# Patient Record
Sex: Male | Born: 1945
Health system: Southern US, Community
[De-identification: ages and names within clinical notes are randomized; demographics above are authoritative.]

## PROBLEM LIST (undated history)

## (undated) DIAGNOSIS — I82409 Acute embolism and thrombosis of unspecified deep veins of unspecified lower extremity: Secondary | ICD-10-CM

## (undated) DIAGNOSIS — R519 Headache, unspecified: Secondary | ICD-10-CM

## (undated) DIAGNOSIS — R079 Chest pain, unspecified: Secondary | ICD-10-CM

## (undated) DIAGNOSIS — R51 Headache: Secondary | ICD-10-CM

## (undated) DIAGNOSIS — E119 Type 2 diabetes mellitus without complications: Secondary | ICD-10-CM

## (undated) DIAGNOSIS — IMO0001 Reserved for inherently not codable concepts without codable children: Secondary | ICD-10-CM

## (undated) DIAGNOSIS — I2510 Atherosclerotic heart disease of native coronary artery without angina pectoris: Secondary | ICD-10-CM

## (undated) DIAGNOSIS — E7800 Pure hypercholesterolemia, unspecified: Secondary | ICD-10-CM

## (undated) DIAGNOSIS — G5603 Carpal tunnel syndrome, bilateral upper limbs: Secondary | ICD-10-CM

## (undated) DIAGNOSIS — K219 Gastro-esophageal reflux disease without esophagitis: Secondary | ICD-10-CM

## (undated) DIAGNOSIS — E785 Hyperlipidemia, unspecified: Secondary | ICD-10-CM

## (undated) DIAGNOSIS — I2699 Other pulmonary embolism without acute cor pulmonale: Secondary | ICD-10-CM

## (undated) DIAGNOSIS — M1990 Unspecified osteoarthritis, unspecified site: Secondary | ICD-10-CM

## (undated) DIAGNOSIS — I10 Essential (primary) hypertension: Secondary | ICD-10-CM

## (undated) DIAGNOSIS — M62838 Other muscle spasm: Secondary | ICD-10-CM

## (undated) HISTORY — DX: Chest pain, unspecified: R07.9

## (undated) HISTORY — PX: ELBOW SURGERY: SHX618

## (undated) HISTORY — PX: TOE SURGERY: SHX1073

## (undated) HISTORY — DX: Acute embolism and thrombosis of unspecified deep veins of unspecified lower extremity: I82.409

## (undated) HISTORY — DX: Other muscle spasm: M62.838

## (undated) HISTORY — DX: Pure hypercholesterolemia, unspecified: E78.00

## (undated) HISTORY — PX: EYE SURGERY: SHX253

## (undated) HISTORY — DX: Type 2 diabetes mellitus without complications: E11.9

## (undated) HISTORY — PX: LEG SURGERY: SHX1003

## (undated) HISTORY — DX: Hyperlipidemia, unspecified: E78.5

## (undated) HISTORY — DX: Carpal tunnel syndrome, bilateral upper limbs: G56.03

## (undated) HISTORY — PX: BILATERAL CARPAL TUNNEL RELEASE: SHX6508

## (undated) HISTORY — PX: COLONOSCOPY: SHX174

## (undated) HISTORY — PX: FOOT SURGERY: SHX648

## (undated) HISTORY — DX: Atherosclerotic heart disease of native coronary artery without angina pectoris: I25.10

## (undated) HISTORY — DX: Gastro-esophageal reflux disease without esophagitis: K21.9

## (undated) HISTORY — PX: SHOULDER SURGERY: SHX246

## (undated) HISTORY — DX: Unspecified osteoarthritis, unspecified site: M19.90

## (undated) HISTORY — DX: Essential (primary) hypertension: I10

---

## 1985-09-04 HISTORY — PX: SPINE SURGERY: SHX786

## 1999-02-23 ENCOUNTER — Encounter: Admission: RE | Admit: 1999-02-23 | Discharge: 1999-04-04 | Payer: Self-pay | Admitting: Anesthesiology

## 1999-09-28 ENCOUNTER — Encounter: Admission: RE | Admit: 1999-09-28 | Discharge: 1999-12-27 | Payer: Self-pay | Admitting: Endocrinology

## 2003-08-18 ENCOUNTER — Emergency Department (HOSPITAL_COMMUNITY): Admission: EM | Admit: 2003-08-18 | Discharge: 2003-08-18 | Payer: Self-pay | Admitting: Emergency Medicine

## 2004-01-26 ENCOUNTER — Ambulatory Visit (HOSPITAL_COMMUNITY): Admission: RE | Admit: 2004-01-26 | Discharge: 2004-01-26 | Payer: Self-pay | Admitting: Cardiology

## 2006-08-05 HISTORY — PX: CATARACT EXTRACTION W/PHACO: SHX586

## 2006-08-18 HISTORY — PX: CATARACT EXTRACTION W/PHACO: SHX586

## 2007-01-08 ENCOUNTER — Encounter (INDEPENDENT_AMBULATORY_CARE_PROVIDER_SITE_OTHER): Payer: Self-pay | Admitting: *Deleted

## 2007-01-08 ENCOUNTER — Ambulatory Visit (HOSPITAL_COMMUNITY): Admission: RE | Admit: 2007-01-08 | Discharge: 2007-01-08 | Payer: Self-pay | Admitting: *Deleted

## 2007-01-08 ENCOUNTER — Encounter (INDEPENDENT_AMBULATORY_CARE_PROVIDER_SITE_OTHER): Payer: Self-pay | Admitting: Specialist

## 2010-03-31 ENCOUNTER — Encounter (INDEPENDENT_AMBULATORY_CARE_PROVIDER_SITE_OTHER): Payer: Self-pay | Admitting: *Deleted

## 2010-05-12 ENCOUNTER — Ambulatory Visit: Payer: Self-pay | Admitting: Gastroenterology

## 2010-05-12 DIAGNOSIS — I10 Essential (primary) hypertension: Secondary | ICD-10-CM | POA: Insufficient documentation

## 2010-05-12 DIAGNOSIS — K219 Gastro-esophageal reflux disease without esophagitis: Secondary | ICD-10-CM | POA: Insufficient documentation

## 2010-05-12 DIAGNOSIS — Z860100 Personal history of colon polyps, unspecified: Secondary | ICD-10-CM | POA: Insufficient documentation

## 2010-05-12 DIAGNOSIS — E119 Type 2 diabetes mellitus without complications: Secondary | ICD-10-CM | POA: Insufficient documentation

## 2010-05-12 LAB — CONVERTED CEMR LAB
ALT: 22 units/L (ref 0–53)
AST: 25 units/L (ref 0–37)
Albumin: 4 g/dL (ref 3.5–5.2)
Alkaline Phosphatase: 82 units/L (ref 39–117)
BUN: 20 mg/dL (ref 6–23)
Basophils Absolute: 0.1 10*3/uL (ref 0.0–0.1)
Basophils Relative: 0.9 % (ref 0.0–3.0)
Bilirubin, Direct: 0.1 mg/dL (ref 0.0–0.3)
CO2: 25 meq/L (ref 19–32)
Calcium: 9.4 mg/dL (ref 8.4–10.5)
Chloride: 109 meq/L (ref 96–112)
Creatinine, Ser: 1.3 mg/dL (ref 0.4–1.5)
Eosinophils Absolute: 0.1 10*3/uL (ref 0.0–0.7)
Eosinophils Relative: 1.9 % (ref 0.0–5.0)
Ferritin: 27.6 ng/mL (ref 22.0–322.0)
Folate: 7.4 ng/mL
GFR calc non Af Amer: 73.86 mL/min (ref >60)
Glucose, Bld: 193 mg/dL — ABNORMAL HIGH (ref 70–99)
HCT: 34.5 % — ABNORMAL LOW (ref 39.0–52.0)
Hemoglobin: 11.8 g/dL — ABNORMAL LOW (ref 13.0–17.0)
Iron: 66 ug/dL (ref 42–165)
Lymphocytes Relative: 30.5 % (ref 12.0–46.0)
Lymphs Abs: 2.2 10*3/uL (ref 0.7–4.0)
MCHC: 34 g/dL (ref 30.0–36.0)
MCV: 84 fL (ref 78.0–100.0)
Monocytes Absolute: 0.7 10*3/uL (ref 0.1–1.0)
Monocytes Relative: 9.5 % (ref 3.0–12.0)
Neutro Abs: 4 10*3/uL (ref 1.4–7.7)
Neutrophils Relative %: 57.2 % (ref 43.0–77.0)
Platelets: 210 10*3/uL (ref 150.0–400.0)
Potassium: 4.5 meq/L (ref 3.5–5.1)
RBC: 4.11 M/uL — ABNORMAL LOW (ref 4.22–5.81)
RDW: 15.8 % — ABNORMAL HIGH (ref 11.5–14.6)
Saturation Ratios: 19.6 % — ABNORMAL LOW (ref 20.0–50.0)
Sodium: 142 meq/L (ref 135–145)
TSH: 1.36 microintl units/mL (ref 0.35–5.50)
Total Bilirubin: 0.5 mg/dL (ref 0.3–1.2)
Total Protein: 7.1 g/dL (ref 6.0–8.3)
Transferrin: 241 mg/dL (ref 212.0–360.0)
Vitamin B-12: 347 pg/mL (ref 211–911)
WBC: 7.1 10*3/uL (ref 4.5–10.5)

## 2010-06-21 ENCOUNTER — Ambulatory Visit: Payer: Self-pay | Admitting: Gastroenterology

## 2010-09-25 ENCOUNTER — Encounter: Payer: Self-pay | Admitting: Orthopaedic Surgery

## 2010-09-26 ENCOUNTER — Encounter: Payer: Self-pay | Admitting: Orthopaedic Surgery

## 2010-10-04 NOTE — Procedures (Signed)
Summary: Colonoscopy & Pathology   Colonoscopy  Procedure date:  01/08/2007  Findings:      Location:  [facility].   NAME:  Matthew Zimmerman, Matthew Zimmerman           ACCOUNT NO.:  [id number]   MEDICAL RECORD NO.:  [id number]          PATIENT TYPE:  AMB   LOCATION:  ENDO                         FACILITY:  [facility]   PHYSICIAN:  Georgiana Spinner, M.D.    DATE OF BIRTH:  12-Oct-1945   DATE OF PROCEDURE:  DATE OF DISCHARGE:                               OPERATIVE REPORT   PROCEDURE:  Colonoscopy.   INDICATIONS:  Colon polyp.   ANESTHESIA:  Demerol 50, Versed 5 mg.   PROCEDURE:  With the patient mildly sedated in the left lateral  decubitus position, the Pentax videoscopic colonoscope was inserted into  the rectum and passed under direct vision to the cecum, identified by  ileocecal valve and appendiceal orifice, both of which were  photographed. From this point, the colonoscope was slowly withdrawn,  taking circumferential views of the colonic mucosa, stopping at  approximately 20 cm from the anal verge at which point a small polyp was  seen and removed using hot biopsy forceps technique at a  setting of  20/200 blended current.  The endoscope was then withdrawn to the rectum  which appeared normal on direct and showed hemorrhoids on retroflexed  view.  The endoscope was straightened and withdrawn.  The patient's  vital signs and pulse oximeter remained stable.  The patient tolerated  the procedure well without apparent complications.   FINDINGS:  Polyp at 20 cm, internal hemorrhoids; otherwise, an  unremarkable exam.   PLAN:  Await biopsy report.  The patient will call me for results and  follow up with me as an outpatient.           ______________________________  Georgiana Spinner, M.D.     GMO/MEDQ  D:  01/08/2007  T:  01/08/2007  Job:  [id number]    SP Surgical Pathology - STATUS: Final             By: Morrie Sheldon,       Perform Date: 6 May08 00:01  Ordered By: Jarvis Newcomer,            Ordered Date: 6 May08 10:28  Facility: [facility]                              Department: CPATH  Service Report Text  [facility]   [address]   [place], [place] [address]   [phone]    REPORT OF SURGICAL PATHOLOGY    Case #: [id number]   Patient Name: Matthew Zimmerman, Matthew Zimmerman   Office Chart Number: N/A    MRN: [id number]   Pathologist: Beulah Gandy. Luisa Hart, MD   DOB/Age December 01, 1945 (Age: 65) Gender: M   Date Taken: 01/08/2007   Date Received: 01/08/2007    FINAL DIAGNOSIS    ***MICROSCOPIC EXAMINATION AND DIAGNOSIS***    1. ESOPHAGUS, BIOPSIES: GASTROESOPHAGEAL JUNCTION MUCOSA WITH   MILD INFLAMMATION CONSISTENT  WITH GASTROESOPHAGEAL REFLUX. NO   INTESTINAL METAPLASIA, DYSPLASIA OR MALIGNANCY IDENTIFIED.    2. COLON, POLYP AT20 CM: TUBULAR ADENOMA. NO HIGH GRADE   DYSPLASIA OR MALIGNANCY IDENTIFIED.    COMMENT   1. An Alcian Blue stain is performed to determine the presence   of intestinal metaplasia (goblet cell metaplasia). No intestinal   metaplasia (goblet cell metaplasia) is identified with the Alcian   Blue stain. The control stained appropriately. (JDP:mj 01/09/07)    mj   Date Reported: 01/09/2007 Beulah Gandy. Luisa Hart, MD   *** Electronically Signed Out By JDP ***    Clinical information   R/O Barrett' s (ni)    specimen(s) obtained   1: Esophagus, biopsy, distal   2: Colon, polyp(s), 20cm    Gross Description   1. Received in formalin are tan, soft tissue fragments that are   submitted in toto. Number: two.   Size: 0.2 and 0.3 cm    2. Received in formalin are tan, soft tissue fragments that are   submitted in toto. Number: two.   Size: 0.2 and 0.3 cm (JBM:mw 01/08/07)    mw/

## 2010-10-04 NOTE — Letter (Signed)
Summary: New Patient letter  [facility]  [address] [place], [place] [address]   Phone: [phone]  Fax: [phone]       03/31/2010 MRN: [id number]  Matthew Zimmerman [address] [place], [place]  [address]  Dear [name],  Welcome to the Gastroenterology Division at [facility].    You are scheduled to see Dr.  Jarold Motto on 05-12-10 at 9:30a.m. on the 3rd floor at [facility], [address]  We ask that you try to arrive at our office 15 minutes prior to your appointment time to allow for check-in.  We would like you to complete the enclosed self-administered evaluation form prior to your visit and bring it with you on the day of your appointment.  We will review it with you.  Also, please bring a complete list of all your medications or, if you prefer, bring the medication bottles and we will list them.  Please bring your insurance card so that we may make a copy of it.  If your insurance requires a referral to see a specialist, please bring your referral form from your primary care physician.  Co-payments are due at the time of your visit and may be paid by cash, check or credit card.     Your office visit will consist of a consult with your physician (includes a physical exam), any laboratory testing he/she may order, scheduling of any necessary diagnostic testing (e.g. x-ray, ultrasound, CT-scan), and scheduling of a procedure (e.g. Endoscopy, Colonoscopy) if required.  Please allow enough time on your schedule to allow for any/all of these possibilities.    If you cannot keep your appointment, please call [phone] to cancel or reschedule prior to your appointment date.  This allows [name] the opportunity to schedule an appointment for another patient in need of care.  If you do not cancel or reschedule by 5 p.m. the business day prior to your appointment date, you will be charged a $50.00 late cancellation/no-show fee.    Thank you for  choosing [facility] for your medical needs.  We appreciate the opportunity to care for you.  Please visit [name] at our website  to learn more about our practice.                     Sincerely,                                                             The Gastroenterology Division

## 2010-10-04 NOTE — Procedures (Signed)
Summary: Endoscopy & Pathology   EGD  Procedure date:  01/08/2007  Findings:      Location: [facility]   NAME:  Matthew Zimmerman, Matthew Zimmerman           ACCOUNT NO.:  [id number]   MEDICAL RECORD NO.:  [id number]          PATIENT TYPE:  AMB   LOCATION:  ENDO                         FACILITY:  [facility]   PHYSICIAN:  Georgiana Spinner, M.D.    DATE OF BIRTH:  1946-02-04   DATE OF PROCEDURE:  01/08/2007  DATE OF DISCHARGE:                               OPERATIVE REPORT   PROCEDURE:  Upper endoscopy.   INDICATIONS:  Gastroesophageal reflux disease.   ANESTHESIA:  Demerol 50, Versed 5 mg.   PROCEDURE:  With the patient mildly sedated in left lateral decubitus  position the Pentax videoscopic endoscope was inserted and passed under  direct vision through the esophagus which appeared normal until we  reached distal esophagus and question of Barrett's photographed and  biopsied.  We entered into the stomach.  Fundus, body, antrum, duodenal  bulb, second portion duodenum appeared normal.  From this point the  endoscope was slowly withdrawn taking circumferential views of duodenal  mucosa until the endoscope had been pulled back into the stomach placed  in retroflexion to view the stomach from below.  Endoscope was  straightened and withdrawn.  The patient's vital signs, pulse oximeter  remained stable.  The patient tolerated procedure well without apparent  complications.   FINDINGS:  Loose wrap of the GE junction around the endoscope, question  of Barrett's esophagus.  Await biopsy report.  The patient will call me  for results and follow-up with me as an outpatient.  Proceed to  colonoscopy.           ______________________________  Georgiana Spinner, M.D.     GMO/MEDQ  D:  01/08/2007  T:  01/08/2007  Job:  [id number]    SP Surgical Pathology - STATUS: Final             By: Morrie Sheldon,       Perform Date: 6 May08 00:01  Ordered By: Jarvis Newcomer,            Ordered Date: 6  May08 10:28  Facility: [facility]                              Department: CPATH  Service Report Text  [facility]   [address]   [place], [place] [address]   [phone]    REPORT OF SURGICAL PATHOLOGY    Case #: [id number]   Patient Name: [name], [name]   Office Chart Number: N/A    MRN: [id number]   Pathologist: Beulah Gandy. Luisa Hart, MD   DOB/Age [birth year] (Age: 28) Gender: M   Date Taken: 01/08/2007   Date Received: 01/08/2007    FINAL DIAGNOSIS    ***MICROSCOPIC EXAMINATION AND DIAGNOSIS***    1. ESOPHAGUS, BIOPSIES: GASTROESOPHAGEAL JUNCTION MUCOSA WITH   MILD INFLAMMATION CONSISTENT WITH GASTROESOPHAGEAL REFLUX. NO   INTESTINAL METAPLASIA, DYSPLASIA OR MALIGNANCY IDENTIFIED.  2. COLON, POLYP AT20 CM: TUBULAR ADENOMA. NO HIGH GRADE   DYSPLASIA OR MALIGNANCY IDENTIFIED.    COMMENT   1. An Alcian Blue stain is performed to determine the presence   of intestinal metaplasia (goblet cell metaplasia). No intestinal   metaplasia (goblet cell metaplasia) is identified with the Alcian   Blue stain. The control stained appropriately. (JDP:mj 01/09/07)    mj   Date Reported: 01/09/2007 Beulah Gandy. Luisa Hart, MD   *** Electronically Signed Out By JDP ***    Clinical information   R/O Barrett' s (ni)    specimen(s) obtained   1: Esophagus, biopsy, distal   2: Colon, polyp(s), 20cm    Gross Description   1. Received in formalin are tan, soft tissue fragments that are   submitted in toto. Number: two.   Size: 0.2 and 0.3 cm    2. Received in formalin are tan, soft tissue fragments that are   submitted in toto. Number: two.   Size: 0.2 and 0.3 cm (JBM:mw 01/08/07)    mw/

## 2010-10-04 NOTE — Assessment & Plan Note (Signed)
Summary: establish new G.I.--ch.   History of Present Illness Visit Type: new patient  Primary GI MD: Sheryn Bison MD FACP FAGA Primary Provider: Claudie Fisherman, MD  Requesting Provider: na Chief Complaint: Establish new GI, was seeing Dr. Virginia Zimmerman. Pt c/o GERD, and cramps in legs  History of Present Illness:   65 year old African American male referred for evaluation of her the typical as the reflux symptoms mostly at night and also recurrent cramps in his lower extremities.  Matthew Zimmerman had a negative endoscopy by Dr. Sabino Zimmerman several years ago. At that time he also had removal of an adenomatous colon polyp. He currently denies dysphagia, but does have burning substernal chest pain mostly at night after overeating. He denies melena, hematochezia, or hepatobiliary complaints. His appetite is good, weight is stable, and he denies food intolerances.  He has cramping in his lower extremities of unexplained etiology, and does have adult onset insulin dependent diabetes. No multiple medications listed and reviewed his record. I cannot see where he is on a diuretic. He denies abuse of alcohol, cigarettes, or NSAIDs.   GI Review of Systems    Reports acid reflux and  heartburn.      Denies abdominal pain, belching, bloating, chest pain, dysphagia with liquids, dysphagia with solids, loss of appetite, nausea, vomiting, vomiting blood, weight loss, and  weight gain.        Denies anal fissure, black tarry stools, change in bowel habit, constipation, diarrhea, diverticulosis, fecal incontinence, heme positive stool, hemorrhoids, irritable bowel syndrome, jaundice, light color stool, liver problems, rectal bleeding, and  rectal pain.    Current Medications (verified): 1)  Januvia 100 Mg Tabs (Sitagliptin Phosphate) .... One Tablet By Mouth Once Daily 2)  Ibuprofen 800 Mg Tabs (Ibuprofen) .... One Tablet By Mouth Once Daily 3)  Crestor 10 Mg Tabs (Rosuvastatin Calcium) .... One Tablet By Mouth Once  Daily 4)  Glipizide-Metformin Hcl 2.5-500 Mg Tabs (Glipizide-Metformin Hcl) .... Three Tablets By Mouth Once Daily 5)  Lyrica 75 Mg Caps (Pregabalin) .... One Capsule Two Times A Day 6)  Humulin 70/30 70-30 % Susp (Insulin Isophane & Regular) .... 40 Units Two Times A Day 7)  Exforge 5-320 Mg Tabs (Amlodipine Besylate-Valsartan) .... One Tablet By Mouth Once Daily  Allergies (verified): 1)  ! Cortisone  Past History:  Past medical, surgical, family and social histories (including risk factors) reviewed for relevance to current acute and chronic problems.  Past Medical History: HYPERTENSION (ICD-401.9) DM (ICD-250.00) GERD (ICD-530.81)  Past Surgical History: Back Surgery x 1  Bilateral Ankel Bilateral Carpal Tunnel Surgery  Left Shoulder Surgery Bilateral Elbow Surgery  Family History: Reviewed history and no changes required. No FH of Colon Cancer: Family History of Diabetes: Father and Brother   Social History: Reviewed history and no changes required. Disabled Married Childern Patient is a former smoker.  Alcohol Use - no Illicit Drug Use - no Smoking Status:  quit Drug Use:  no  Review of Systems       The patient complains of arthritis/joint pain, back pain, fatigue, hearing problems, itching, and muscle pains/cramps.  The patient denies allergy/sinus, anemia, anxiety-new, blood in urine, breast changes/lumps, change in vision, confusion, cough, coughing up blood, depression-new, fainting, fever, headaches-new, heart murmur, heart rhythm changes, night sweats, nosebleeds, shortness of breath, skin rash, sleeping problems, sore throat, swelling of feet/legs, swollen lymph glands, thirst - excessive, urination - excessive, urination changes/pain, urine leakage, vision changes, and voice change.    Vital Signs:  Patient  profile:   65 year old male Height:      68 inches Weight:      210 pounds BMI:     32.05 BSA:     2.09 Pulse rate:   76 / minute Pulse rhythm:    regular BP sitting:   132 / 80  (left arm) Cuff size:   regular  Vitals Entered By: Ok Anis CMA (May 12, 2010 9:46 AM)  Physical Exam  General:  Well developed, well nourished, no acute distress.healthy appearing.  healthy appearing.   Head:  Normocephalic and atraumatic. Eyes:  PERRLA, no icterus.exam deferred to patient's ophthalmologist.  exam deferred to patient's ophthalmologist.   Neck:  Supple; no masses or thyromegaly. Lungs:  Clear throughout to auscultation. Heart:  Regular rate and rhythm; no murmurs, rubs,  or bruits. Abdomen:  Soft, nontender and nondistended. No masses, hepatosplenomegaly or hernias noted. Normal bowel sounds. Msk:  Symmetrical with no gross deformities. Normal posture. Pulses:  Normal pulses noted. Extremities:  No clubbing, cyanosis, edema or deformities noted. Neurologic:  Alert and  oriented x4;  grossly normal neurologically. Psych:  Alert and cooperative. Normal mood and affect.   Impression & Recommendations:  Problem # 1:  GERD (ICD-530.81) Assessment Deteriorated reflux Regime Reviewed with Patient and His Wife and I have started him on AcipHex 20 mg 30 minutes before his evening meal. I will see him back in 6 weeks' time for followup. Screening lab test ordered because of his reflux problems and his muscle cramping. Orders: TLB-CBC Platelet - w/Differential (85025-CBCD) TLB-BMP (Basic Metabolic Panel-BMET) (80048-METABOL) TLB-Hepatic/Liver Function Pnl (80076-HEPATIC) TLB-TSH (Thyroid Stimulating Hormone) (84443-TSH) TLB-B12, Serum-Total ONLY (57846-N62) TLB-Ferritin (82728-FER) TLB-Folic Acid (Folate) (82746-FOL) TLB-IBC Pnl (Iron/FE;Transferrin) (83550-IBC)  Problem # 2:  HYPERTENSION (ICD-401.9) Assessment: Improved blood pressure today normal at 132/80. Multiple medications per primary care to be continued.  Problem # 3:  DM (ICD-250.00) Assessment: Improved continue medications per primary care  Problem # 4:   COLONIC POLYPS, HX OF (ICD-V12.72) Assessment: Unchanged coscopy followup needed and may of 2012. He did have an adenomatous sigmoid colon polyp removed. TLB-CBC Platelet - w/Differential (85025-CBCD) TLB-BMP (Basic Metabolic Panel-BMET) (80048-METABOL) TLB-Hepatic/Liver Function Pnl (80076-HEPATIC) TLB-TSH (Thyroid Stimulating Hormone) (84443-TSH) TLB-B12, Serum-Total ONLY (95284-X32) TLB-Ferritin (82728-FER) TLB-Folic Acid (Folate) (82746-FOL) TLB-IBC Pnl (Iron/FE;Transferrin) (83550-IBC)  Patient Instructions: 1)  Copy sent to : Matthew Fisherman, MD  2)  Your prescription(s) have been sent to you pharmacy.  3)  You watched the Reflux movie in teh office today. 4)  Take the Aciphex once a day 30 min before supper. 5)  Please go to the basement today for your labs.  6)  Please schedule a follow-up appointment in 4 to 6 weeks.  7)  Avoid foods high in acid content ( tomatoes, citrus juices, spicy foods) . Avoid eating within 3 to 4 hours of lying down or before exercising. Do not over eat; try smaller more frequent meals. Elevate head of bed four inches when sleeping.  Prescriptions: ACIPHEX 20 MG TBEC (RABEPRAZOLE SODIUM) take one by mouth every night 30 min before supper  #30 x 3   Entered by:   Harlow Mares CMA (AAMA)   Authorized by:   Mardella Layman MD [facility]   Signed by:   Mardella Layman MD [facility] on 05/12/2010   Method used:   Electronically to        [facility]  [address] [phone]* (retail)       [address]  [place], [place]  [address]       Ph: [phone]       Fax: [phone]   RxID:   [id number]   Appended Document: establish new G.I.--ch.    Clinical Lists Changes  Medications: Changed medication from ACIPHEX 20 MG TBEC (RABEPRAZOLE SODIUM) take one by mouth every night 30 min before supper to NEXIUM 40 MG CPDR (ESOMEPRAZOLE MAGNESIUM) take one by mouth once daily 30 min before dinner - Signed Rx of NEXIUM 40 MG CPDR (ESOMEPRAZOLE MAGNESIUM) take one  by mouth once daily 30 min before dinner;  #30 x 3;  Signed;  Entered by: Harlow Mares CMA (AAMA);  Authorized by: Mardella Layman MD [facility];  Method used: Electronically to [facility] [address] [id number]*, [address], [place], [place]  [address], Ph: [phone], Fax: [phone]    Prescriptions: NEXIUM 40 MG CPDR (ESOMEPRAZOLE MAGNESIUM) take one by mouth once daily 30 min before dinner  #30 x 3   Entered by:   Harlow Mares CMA (AAMA)   Authorized by:   Mardella Layman MD [facility]   Signed by:   Harlow Mares CMA (AAMA) on 05/12/2010   Method used:   Electronically to        [facility] [address] [phone]* (retail)       [address]       [place], [place]  [address]       Ph: [phone]       Fax: [phone]   RxID:   [id number]

## 2011-01-01 ENCOUNTER — Other Ambulatory Visit: Payer: Self-pay | Admitting: Gastroenterology

## 2011-01-20 NOTE — Op Note (Signed)
NAMEMarland Kitchen  HERBY, AMICK NO.:  [id number]   MEDICAL RECORD NO.:  [id number]          PATIENT TYPE:  AMB   LOCATION:  ENDO                         FACILITY:  [facility]   PHYSICIAN:  Georgiana Spinner, M.D.    DATE OF BIRTH:  04-06-1946   DATE OF PROCEDURE:  01/08/2007  DATE OF DISCHARGE:                               OPERATIVE REPORT   PROCEDURE:  Upper endoscopy.   INDICATIONS:  Gastroesophageal reflux disease.   ANESTHESIA:  Demerol 50, Versed 5 mg.   PROCEDURE:  With the patient mildly sedated in left lateral decubitus  position the Pentax videoscopic endoscope was inserted and passed under  direct vision through the esophagus which appeared normal until we  reached distal esophagus and question of Barrett's photographed and  biopsied.  We entered into the stomach.  Fundus, body, antrum, duodenal  bulb, second portion duodenum appeared normal.  From this point the  endoscope was slowly withdrawn taking circumferential views of duodenal  mucosa until the endoscope had been pulled back into the stomach placed  in retroflexion to view the stomach from below.  Endoscope was  straightened and withdrawn.  The patient's vital signs, pulse oximeter  remained stable.  The patient tolerated procedure well without apparent  complications.   FINDINGS:  Loose wrap of the GE junction around the endoscope, question  of Barrett's esophagus.  Await biopsy report.  The patient will call me  for results and follow-up with me as an outpatient.  Proceed to  colonoscopy.           ______________________________  Georgiana Spinner, M.D.     GMO/MEDQ  D:  01/08/2007  T:  01/08/2007  Job:  [id number]

## 2011-01-20 NOTE — Op Note (Signed)
NAMEMarland Kitchen  HENDRIK, [name] NO.:  [id number]   MEDICAL RECORD NO.:  [id number]          PATIENT TYPE:  AMB   LOCATION:  ENDO                         FACILITY:  [facility]   PHYSICIAN:  Georgiana Spinner, M.D.    DATE OF BIRTH:  [birth year]   DATE OF PROCEDURE:  DATE OF DISCHARGE:                               OPERATIVE REPORT   PROCEDURE:  Colonoscopy.   INDICATIONS:  Colon polyp.   ANESTHESIA:  Demerol 50, Versed 5 mg.   PROCEDURE:  With the patient mildly sedated in the left lateral  decubitus position, the Pentax videoscopic colonoscope was inserted into  the rectum and passed under direct vision to the cecum, identified by  ileocecal valve and appendiceal orifice, both of which were  photographed. From this point, the colonoscope was slowly withdrawn,  taking circumferential views of the colonic mucosa, stopping at  approximately 20 cm from the anal verge at which point a small polyp was  seen and removed using hot biopsy forceps technique at a  setting of  20/200 blended current.  The endoscope was then withdrawn to the rectum  which appeared normal on direct and showed hemorrhoids on retroflexed  view.  The endoscope was straightened and withdrawn.  The patient's  vital signs and pulse oximeter remained stable.  The patient tolerated  the procedure well without apparent complications.   FINDINGS:  Polyp at 20 cm, internal hemorrhoids; otherwise, an  unremarkable exam.   PLAN:  Await biopsy report.  The patient will call me for results and  follow up with me as an outpatient.           ______________________________  Georgiana Spinner, M.D.     GMO/MEDQ  D:  01/08/2007  T:  01/08/2007  Job:  [id number]

## 2012-06-22 ENCOUNTER — Ambulatory Visit (INDEPENDENT_AMBULATORY_CARE_PROVIDER_SITE_OTHER): Payer: Medicare Other | Admitting: Family Medicine

## 2012-06-22 ENCOUNTER — Ambulatory Visit: Payer: Medicare Other

## 2012-06-22 VITALS — BP 178/83 | HR 61 | Temp 98.4°F | Resp 18 | Ht 67.0 in | Wt 197.6 lb

## 2012-06-22 DIAGNOSIS — M542 Cervicalgia: Secondary | ICD-10-CM

## 2012-06-22 DIAGNOSIS — S161XXA Strain of muscle, fascia and tendon at neck level, initial encounter: Secondary | ICD-10-CM

## 2012-06-22 DIAGNOSIS — M47812 Spondylosis without myelopathy or radiculopathy, cervical region: Secondary | ICD-10-CM

## 2012-06-22 DIAGNOSIS — S139XXA Sprain of joints and ligaments of unspecified parts of neck, initial encounter: Secondary | ICD-10-CM

## 2012-06-22 MED ORDER — IBUPROFEN 800 MG PO TABS: 800.0000 mg | ORAL_TABLET | Freq: Three times a day (TID) | ORAL | Status: DC | PRN

## 2012-06-22 MED ORDER — METHOCARBAMOL 750 MG PO TABS: 750.0000 mg | ORAL_TABLET | Freq: Three times a day (TID) | ORAL | Status: DC

## 2012-06-22 MED ORDER — TRAMADOL HCL 50 MG PO TABS: 50.0000 mg | ORAL_TABLET | Freq: Three times a day (TID) | ORAL | Status: DC | PRN

## 2012-06-22 NOTE — Patient Instructions (Addendum)
1. Neck pain on left side  DG Cervical Spine Complete  2. Neck strain  ibuprofen (ADVIL,MOTRIN) 800 MG tablet, methocarbamol (ROBAXIN-750) 750 MG tablet, traMADol (ULTRAM) 50 MG tablet  3. Cervical spine degeneration     Cervical Strain and Sprain (Whiplash) with Rehab Cervical strain and sprains are injuries that commonly occur with "whiplash" injuries. Whiplash occurs when the neck is forcefully whipped backward or forward, such as during a motor vehicle accident. The muscles, ligaments, tendons, discs and nerves of the neck are susceptible to injury when this occurs. SYMPTOMS   Pain or stiffness in the front and/or back of neck  Symptoms may present immediately or up to 24 hours after injury.  Dizziness, headache, nausea and vomiting.  Muscle spasm with soreness and stiffness in the neck.  Tenderness and swelling at the injury site. CAUSES  Whiplash injuries often occur during contact sports or motor vehicle accidents.  RISK INCREASES WITH:  Osteoarthritis of the spine.  Situations that make head or neck accidents or trauma more likely.  High-risk sports (football, rugby, wrestling, hockey, auto racing, gymnastics, diving, contact karate or boxing).  Poor strength and flexibility of the neck.  Previous neck injury.  Poor tackling technique.  Improperly fitted or padded equipment. PREVENTION  Learn and use proper technique (avoid tackling with the head, spearing and head-butting; use proper falling techniques to avoid landing on the head).  Warm up and stretch properly before activity.  Maintain physical fitness:  Strength, flexibility and endurance.  Cardiovascular fitness.  Wear properly fitted and padded protective equipment, such as padded soft collars, for participation in contact sports. PROGNOSIS  Recovery for cervical strain and sprain injuries is dependent on the extent of the injury. These injuries are usually curable in 1 week to 3 months with appropriate  treatment.  RELATED COMPLICATIONS   Temporary numbness and weakness may occur if the nerve roots are damaged, and this may persist until the nerve has completely healed.  Chronic pain due to frequent recurrence of symptoms.  Prolonged healing, especially if activity is resumed too soon (before complete recovery). TREATMENT  Treatment initially involves the use of ice and medication to help reduce pain and inflammation. It is also important to perform strengthening and stretching exercises and modify activities that worsen symptoms so the injury does not get worse. These exercises may be performed at home or with a therapist. For patients who experience severe symptoms, a soft padded collar may be recommended to be worn around the neck.  Improving your posture may help reduce symptoms. Posture improvement includes pulling your chin and abdomen in while sitting or standing. If you are sitting, sit in a firm chair with your buttocks against the back of the chair. While sleeping, try replacing your pillow with a small towel rolled to 2 inches in diameter, or use a cervical pillow or soft cervical collar. Poor sleeping positions delay healing.  For patients with nerve root damage, which causes numbness or weakness, the use of a cervical traction apparatus may be recommended. Surgery is rarely necessary for these injuries. However, cervical strain and sprains that are present at birth (congenital) may require surgery. MEDICATION   If pain medication is necessary, nonsteroidal anti-inflammatory medications, such as aspirin and ibuprofen, or other minor pain relievers, such as acetaminophen, are often recommended.  Do not take pain medication for 7 days before surgery.  Prescription pain relievers may be given if deemed necessary by your caregiver. Use only as directed and only as much as  you need. HEAT AND COLD:   Cold treatment (icing) relieves pain and reduces inflammation. Cold treatment should be  applied for 10 to 15 minutes every 2 to 3 hours for inflammation and pain and immediately after any activity that aggravates your symptoms. Use ice packs or an ice massage.  Heat treatment may be used prior to performing the stretching and strengthening activities prescribed by your caregiver, physical therapist, or athletic trainer. Use a heat pack or a warm soak. SEEK MEDICAL CARE IF:   Symptoms get worse or do not improve in 2 weeks despite treatment.  New, unexplained symptoms develop (drugs used in treatment may produce side effects). EXERCISES RANGE OF MOTION (ROM) AND STRETCHING EXERCISES - Cervical Strain and Sprain These exercises may help you when beginning to rehabilitate your injury. In order to successfully resolve your symptoms, you must improve your posture. These exercises are designed to help reduce the forward-head and rounded-shoulder posture which contributes to this condition. Your symptoms may resolve with or without further involvement from your physician, physical therapist or athletic trainer. While completing these exercises, remember:   Restoring tissue flexibility helps normal motion to return to the joints. This allows healthier, less painful movement and activity.  An effective stretch should be held for at least 20 seconds, although you may need to begin with shorter hold times for comfort.  A stretch should never be painful. You should only feel a gentle lengthening or release in the stretched tissue. STRETCH- Axial Extensors  Lie on your back on the floor. You may bend your knees for comfort. Place a rolled up hand towel or dish towel, about 2 inches in diameter, under the part of your head that makes contact with the floor.  Gently tuck your chin, as if trying to make a "double chin," until you feel a gentle stretch at the base of your head.  Hold __________ seconds. Repeat __________ times. Complete this exercise __________ times per day.  STRETECH - Axial  Extension   Stand or sit on a firm surface. Assume a good posture: chest up, shoulders drawn back, abdominal muscles slightly tense, knees unlocked (if standing) and feet hip width apart.  Slowly retract your chin so your head slides back and your chin slightly lowers.Continue to look straight ahead.  You should feel a gentle stretch in the back of your head. Be certain not to feel an aggressive stretch since this can cause headaches later.  Hold for __________ seconds. Repeat __________ times. Complete this exercise __________ times per day. STRETCH  Cervical Side Bend   Stand or sit on a firm surface. Assume a good posture: chest up, shoulders drawn back, abdominal muscles slightly tense, knees unlocked (if standing) and feet hip width apart.  Without letting your nose or shoulders move, slowly tip your right / left ear to your shoulder until your feel a gentle stretch in the muscles on the opposite side of your neck.  Hold __________ seconds. Repeat __________ times. Complete this exercise __________ times per day. STRETCH  Cervical Rotators   Stand or sit on a firm surface. Assume a good posture: chest up, shoulders drawn back, abdominal muscles slightly tense, knees unlocked (if standing) and feet hip width apart.  Keeping your eyes level with the ground, slowly turn your head until you feel a gentle stretch along the back and opposite side of your neck.  Hold __________ seconds. Repeat __________ times. Complete this exercise __________ times per day. RANGE OF MOTION - Neck Circles  Stand or sit on a firm surface. Assume a good posture: chest up, shoulders drawn back, abdominal muscles slightly tense, knees unlocked (if standing) and feet hip width apart.  Gently roll your head down and around from the back of one shoulder to the back of the other. The motion should never be forced or painful.  Repeat the motion 10-20 times, or until you feel the neck muscles relax and  loosen. Repeat __________ times. Complete the exercise __________ times per day. STRENGTHENING EXERCISES - Cervical Strain and Sprain These exercises may help you when beginning to rehabilitate your injury. They may resolve your symptoms with or without further involvement from your physician, physical therapist or athletic trainer. While completing these exercises, remember:   Muscles can gain both the endurance and the strength needed for everyday activities through controlled exercises.  Complete these exercises as instructed by your physician, physical therapist or athletic trainer. Progress the resistance and repetitions only as guided.  You may experience muscle soreness or fatigue, but the pain or discomfort you are trying to eliminate should never worsen during these exercises. If this pain does worsen, stop and make certain you are following the directions exactly. If the pain is still present after adjustments, discontinue the exercise until you can discuss the trouble with your clinician. STRENGTH Cervical Flexors, Isometric  Face a wall, standing about 6 inches away. Place a small pillow, a ball about 6-8 inches in diameter, or a folded towel between your forehead and the wall.  Slightly tuck your chin and gently push your forehead into the soft object. Push only with mild to moderate intensity, building up tension gradually. Keep your jaw and forehead relaxed.  Hold 10 to 20 seconds. Keep your breathing relaxed.  Release the tension slowly. Relax your neck muscles completely before you start the next repetition. Repeat __________ times. Complete this exercise __________ times per day. STRENGTH- Cervical Lateral Flexors, Isometric   Stand about 6 inches away from a wall. Place a small pillow, a ball about 6-8 inches in diameter, or a folded towel between the side of your head and the wall.  Slightly tuck your chin and gently tilt your head into the soft object. Push only with mild  to moderate intensity, building up tension gradually. Keep your jaw and forehead relaxed.  Hold 10 to 20 seconds. Keep your breathing relaxed.  Release the tension slowly. Relax your neck muscles completely before you start the next repetition. Repeat __________ times. Complete this exercise __________ times per day. STRENGTH  Cervical Extensors, Isometric   Stand about 6 inches away from a wall. Place a small pillow, a ball about 6-8 inches in diameter, or a folded towel between the back of your head and the wall.  Slightly tuck your chin and gently tilt your head back into the soft object. Push only with mild to moderate intensity, building up tension gradually. Keep your jaw and forehead relaxed.  Hold 10 to 20 seconds. Keep your breathing relaxed.  Release the tension slowly. Relax your neck muscles completely before you start the next repetition. Repeat __________ times. Complete this exercise __________ times per day. POSTURE AND BODY MECHANICS CONSIDERATIONS - Cervical Strain and Sprain Keeping correct posture when sitting, standing or completing your activities will reduce the stress put on different body tissues, allowing injured tissues a chance to heal and limiting painful experiences. The following are general guidelines for improved posture. Your physician or physical therapist will provide you with any instructions specific to  your needs. While reading these guidelines, remember:  The exercises prescribed by your provider will help you have the flexibility and strength to maintain correct postures.  The correct posture provides the optimal environment for your joints to work. All of your joints have less wear and tear when properly supported by a spine with good posture. This means you will experience a healthier, less painful body.  Correct posture must be practiced with all of your activities, especially prolonged sitting and standing. Correct posture is as important when doing  repetitive low-stress activities (typing) as it is when doing a single heavy-load activity (lifting). PROLONGED STANDING WHILE SLIGHTLY LEANING FORWARD When completing a task that requires you to lean forward while standing in one place for a long time, place either foot up on a stationary 2-4 inch high object to help maintain the best posture. When both feet are on the ground, the low back tends to lose its slight inward curve. If this curve flattens (or becomes too large), then the back and your other joints will experience too much stress, fatigue more quickly and can cause pain.  RESTING POSITIONS Consider which positions are most painful for you when choosing a resting position. If you have pain with flexion-based activities (sitting, bending, stooping, squatting), choose a position that allows you to rest in a less flexed posture. You would want to avoid curling into a fetal position on your side. If your pain worsens with extension-based activities (prolonged standing, working overhead), avoid resting in an extended position such as sleeping on your stomach. Most people will find more comfort when they rest with their spine in a more neutral position, neither too rounded nor too arched. Lying on a non-sagging bed on your side with a pillow between your knees, or on your back with a pillow under your knees will often provide some relief. Keep in mind, being in any one position for a prolonged period of time, no matter how correct your posture, can still lead to stiffness. WALKING Walk with an upright posture. Your ears, shoulders and hips should all line-up. OFFICE WORK When working at a desk, create an environment that supports good, upright posture. Without extra support, muscles fatigue and lead to excessive strain on joints and other tissues. CHAIR:  A chair should be able to slide under your desk when your back makes contact with the back of the chair. This allows you to work closely.  The  chair's height should allow your eyes to be level with the upper part of your monitor and your hands to be slightly lower than your elbows.  Body position:  Your feet should make contact with the floor. If this is not possible, use a foot rest.  Keep your ears over your shoulders. This will reduce stress on your neck and low back. Document Released: 08/21/2005 Document Revised: 11/13/2011 Document Reviewed: 12/03/2008 [facility] Patient Information 2013 [place], [place]. Cervical Sprain A cervical sprain is an injury in the neck in which the ligaments are stretched or torn. The ligaments are the tissues that hold the bones of the neck (vertebrae) in place.Cervical sprains can range from very mild to very severe. Most cervical sprains get better in 1 to 3 weeks, but it depends on the cause and extent of the injury. Severe cervical sprains can cause the neck vertebrae to be unstable. This can lead to damage of the spinal cord and can result in serious nervous system problems. Your caregiver will determine whether your cervical sprain is  mild or severe. CAUSES  Severe cervical sprains may be caused by:  Contact sport injuries (football, rugby, wrestling, hockey, auto racing, gymnastics, diving, martial arts, boxing).  Motor vehicle collisions.  Whiplash injuries. This means the neck is forcefully whipped backward and forward.  Falls. Mild cervical sprains may be caused by:   Awkward positions, such as cradling a telephone between your ear and shoulder.  Sitting in a chair that does not offer proper support.  Working at a poorly Marketing executive station.  Activities that require looking up or down for long periods of time. SYMPTOMS   Pain, soreness, stiffness, or a burning sensation in the front, back, or sides of the neck. This discomfort may develop immediately after injury or it may develop slowly and not begin for 24 hours or more after an injury.  Pain or tenderness directly in the  middle of the back of the neck.  Shoulder or upper back pain.  Limited ability to move the neck.  Headache.  Dizziness.  Weakness, numbness, or tingling in the hands or arms.  Muscle spasms.  Difficulty swallowing or chewing.  Tenderness and swelling of the neck. DIAGNOSIS  Most of the time, your caregiver can diagnose this problem by taking your history and doing a physical exam. Your caregiver will ask about any known problems, such as arthritis in the neck or a previous neck injury. X-rays may be taken to find out if there are any other problems, such as problems with the bones of the neck. However, an X-ray often does not reveal the full extent of a cervical sprain. Other tests such as a computed tomography (CT) scan or magnetic resonance imaging (MRI) may be needed. TREATMENT  Treatment depends on the severity of the cervical sprain. Mild sprains can be treated with rest, keeping the neck in place (immobilization), and pain medicines. Severe cervical sprains need immediate immobilization and an appointment with an orthopedist or neurosurgeon. Several treatment options are available to help with pain, muscle spasms, and other symptoms. Your caregiver may prescribe:  Medicines, such as pain relievers, numbing medicines, or muscle relaxants.  Physical therapy. This can include stretching exercises, strengthening exercises, and posture training. Exercises and improved posture can help stabilize the neck, strengthen muscles, and help stop symptoms from returning.  A neck collar to be worn for short periods of time. Often, these collars are worn for comfort. However, certain collars may be worn to protect the neck and prevent further worsening of a serious cervical sprain. HOME CARE INSTRUCTIONS   Put ice on the injured area.  Put ice in a plastic bag.  Place a towel between your skin and the bag.  Leave the ice on for 15 to 20 minutes, 3 to 4 times a day.  Only take  over-the-counter or prescription medicines for pain, discomfort, or fever as directed by your caregiver.  Keep all follow-up appointments as directed by your caregiver.  Keep all physical therapy appointments as directed by your caregiver.  If a neck collar is prescribed, wear it as directed by your caregiver.  Do not drive while wearing a neck collar.  Make any needed adjustments to your work station to promote good posture.  Avoid positions and activities that make your symptoms worse.  Warm up and stretch before being active to help prevent problems. SEEK MEDICAL CARE IF:   Your pain is not controlled with medicine.  You are unable to decrease your pain medicine over time as planned.  Your activity level  is not improving as expected. SEEK IMMEDIATE MEDICAL CARE IF:   You develop any bleeding, stomach upset, or signs of an allergic reaction to your medicine.  Your symptoms get worse.  You develop new, unexplained symptoms.  You have numbness, tingling, weakness, or paralysis in any part of your body. MAKE SURE YOU:   Understand these instructions.  Will watch your condition.  Will get help right away if you are not doing well or get worse. Document Released: 06/18/2007 Document Revised: 11/13/2011 Document Reviewed: 05/24/2011 [facility] Patient Information 2013 [place], [place].

## 2012-06-22 NOTE — Progress Notes (Signed)
[address]   [place], [place]  [address]   [phone]  Subjective:    Patient ID: Matthew Zimmerman, male    DOB: 08-20-1946, 66 y.o.   MRN: [id number]  HPIThis 66 y.o. male presents for evaluation of neck pain.  Five days ago, tripped on step and fell.  Heard a pop in neck.  Onset of L side of neck and radiates into head. Larey Seat forward and mostly on L side.  Head hit the pavement; no loss of consciousness.  No pain for two days then developed pain on L side.  No radiation into arms; no n/t.  No weakness in extremities.  Four years ago, went to [place], [place] had same pain in head.  Unknown if DDD neck.  No vision changes, diplopia, dizziness, nausea, vomiting, fever, no confusion.  Has taken 800mg  Motrin bid for pain.  No other medication.  Heating pad with temporary relief.  Severity 5/10.     Review of Systems  Constitutional: Negative for fever, chills, diaphoresis and fatigue.  HENT: Negative for rhinorrhea.   Eyes: Negative for photophobia and visual disturbance.  Respiratory: Negative for shortness of breath.   Cardiovascular: Negative for chest pain.  Gastrointestinal: Positive for nausea. Negative for vomiting.  Musculoskeletal: Positive for myalgias and back pain.  Skin: Negative for rash.  Neurological: Positive for dizziness and headaches. Negative for tremors, seizures, syncope, facial asymmetry, speech difficulty, weakness, light-headedness and numbness.  Psychiatric/Behavioral: Negative for confusion.        Past Medical History  Diagnosis Date  . Diabetes mellitus without complication   . GERD (gastroesophageal reflux disease)   . Hyperlipidemia   . Carpal tunnel syndrome on both sides     Past Surgical History  Procedure Date  . Spine surgery 09/04/1985    lumbar spine  . Elbow surgery     BONE SPURS REMOVED B ELBOWS  . Foot surgery     HEEL SPURS REMOVED B HEELS  . Toe surgery     Prior to Admission medications   Medication Sig Start Date End Date Taking?  Authorizing Provider  glyBURIDE-metformin (GLUCOVANCE) 2.5-500 MG per tablet Take 1 tablet by mouth daily with breakfast.   Yes Historical Provider, MD  insulin NPH-insulin regular (NOVOLIN 70/30) (70-30) 100 UNIT/ML injection Inject into the skin.   Yes Historical Provider, MD  rosuvastatin (CRESTOR) 10 MG tablet Take 10 mg by mouth daily.   Yes Historical Provider, MD  sitaGLIPtin (JANUVIA) 100 MG tablet Take 100 mg by mouth daily.   Yes Historical Provider, MD  esomeprazole (NEXIUM) 40 MG capsule Take one capsule by mouth once a day..... NEEDS OFFICE VISIT 01/02/11   Mardella Layman, MD  ibuprofen (ADVIL,MOTRIN) 800 MG tablet Take 1 tablet (800 mg total) by mouth every 8 (eight) hours as needed for pain. 06/22/12   Ethelda Chick, MD  methocarbamol (ROBAXIN-750) 750 MG tablet Take 1 tablet (750 mg total) by mouth 3 (three) times daily. 06/22/12   Ethelda Chick, MD  traMADol (ULTRAM) 50 MG tablet Take 1 tablet (50 mg total) by mouth every 8 (eight) hours as needed for pain. 06/22/12   Ethelda Chick, MD    Allergies  Allergen Reactions  . Cortisone     History   Social History  . Marital Status: Married    Spouse Name: N/A    Number of Children: N/A  . Years of Education: N/A   Occupational History  . Not on file.   Social  History Main Topics  . Smoking status: Former Games developer  . Smokeless tobacco: Former Neurosurgeon  . Alcohol Use: No  . Drug Use: No  . Sexually Active: Not on file   Other Topics Concern  . Not on file   Social History Narrative  . No narrative on file    No family history on file.  Objective:   Physical Exam  Nursing note and vitals reviewed. Constitutional: He is oriented to person, place, and time. He appears well-developed and well-nourished. No distress.  HENT:  Head: Normocephalic and atraumatic.  Right Ear: External ear normal.  Left Ear: External ear normal.  Nose: Nose normal.  Mouth/Throat: Oropharynx is clear and moist.  Eyes: Conjunctivae  normal and EOM are normal. Pupils are equal, round, and reactive to light.  Neck: Neck supple. No JVD present. No thyromegaly present.  Cardiovascular: Normal rate, regular rhythm and normal heart sounds.   No murmur heard. Pulmonary/Chest: Effort normal and breath sounds normal. No respiratory distress. He has no wheezes. He has no rales.  Musculoskeletal:       Right shoulder: He exhibits normal range of motion, no tenderness, no bony tenderness and normal strength.       Left shoulder: He exhibits normal range of motion, no tenderness, no bony tenderness, no spasm and normal strength.       Cervical back: He exhibits decreased range of motion, tenderness, pain and spasm. He exhibits no bony tenderness, no swelling, no edema, no deformity, no laceration and normal pulse.       CERVICAL SPINE:  NO MIDLINE TTP; +TTP L TRAPEZIUS REGION; +TTP OCCIPITAL RIDGE L>R; DECREASED ROM ALL DIRECTIONS CERVICAL SPINE DUE TO PAIN.  MOTOR 5/5 X 4 EXTREMITIES.  Lymphadenopathy:    He has no cervical adenopathy.  Neurological: He is alert and oriented to person, place, and time. He has normal reflexes. No cranial nerve deficit. He exhibits normal muscle tone. Coordination normal.  Skin: Skin is warm and dry. He is not diaphoretic.  Psychiatric: He has a normal mood and affect. His behavior is normal. Judgment and thought content normal.      [facility] reading (PRIMARY) by  Dr. Katrinka Blazing.  C-spine:  Degenerative changes with spurring.    Assessment & Plan:   1. Neck pain on left side  DG Cervical Spine Complete  2. Neck strain  ibuprofen (ADVIL,MOTRIN) 800 MG tablet, methocarbamol (ROBAXIN-750) 750 MG tablet, traMADol (ULTRAM) 50 MG tablet  3. Cervical spine degeneration        1.  Cervical neck pain/strain:  New.  Onset 48 hours after fall.  Rx for Ibuprofen 800mg  tid x one week only; rx for Robaxin provided.  Rx for Ultram also provided.  Home exercises reviewed and provided to perform daily. If no improvement  in 1-2 weeks, to call office for ortho referral. 2.  DDD Cervical Spine:  Chronic issue with recent exacerbation due to fall.     Meds ordered this encounter  Medications  . rosuvastatin (CRESTOR) 10 MG tablet    Sig: Take 10 mg by mouth daily.  . sitaGLIPtin (JANUVIA) 100 MG tablet    Sig: Take 100 mg by mouth daily.  [name] glyBURIDE-metformin (GLUCOVANCE) 2.5-500 MG per tablet    Sig: Take 1 tablet by mouth daily with breakfast.  . insulin NPH-insulin regular (NOVOLIN 70/30) (70-30) 100 UNIT/ML injection    Sig: Inject into the skin.  [name] ibuprofen (ADVIL,MOTRIN) 800 MG tablet    Sig: Take 1 tablet (800 mg  total) by mouth every 8 (eight) hours as needed for pain.    Dispense:  30 tablet    Refill:  0  . methocarbamol (ROBAXIN-750) 750 MG tablet    Sig: Take 1 tablet (750 mg total) by mouth 3 (three) times daily.    Dispense:  40 tablet    Refill:  0  . traMADol (ULTRAM) 50 MG tablet    Sig: Take 1 tablet (50 mg total) by mouth every 8 (eight) hours as needed for pain.    Dispense:  40 tablet    Refill:  0

## 2012-08-06 ENCOUNTER — Ambulatory Visit: Payer: Medicare Other | Attending: Endocrinology | Admitting: Rehabilitative and Restorative Service Providers"

## 2012-08-06 DIAGNOSIS — IMO0001 Reserved for inherently not codable concepts without codable children: Secondary | ICD-10-CM | POA: Insufficient documentation

## 2012-08-06 DIAGNOSIS — M542 Cervicalgia: Secondary | ICD-10-CM | POA: Insufficient documentation

## 2012-08-06 DIAGNOSIS — R293 Abnormal posture: Secondary | ICD-10-CM | POA: Insufficient documentation

## 2012-08-06 DIAGNOSIS — M2569 Stiffness of other specified joint, not elsewhere classified: Secondary | ICD-10-CM | POA: Insufficient documentation

## 2012-08-10 NOTE — Progress Notes (Signed)
Reviewed and agree.

## 2012-08-13 ENCOUNTER — Ambulatory Visit: Payer: Medicare Other | Admitting: Physical Therapy

## 2012-08-15 ENCOUNTER — Ambulatory Visit: Payer: Medicare Other | Admitting: Physical Therapy

## 2012-08-19 ENCOUNTER — Ambulatory Visit: Payer: Medicare Other | Admitting: Physical Therapy

## 2012-09-02 ENCOUNTER — Ambulatory Visit: Payer: Medicare Other | Admitting: Rehabilitative and Restorative Service Providers"

## 2012-10-15 ENCOUNTER — Ambulatory Visit: Payer: Medicare Other

## 2012-10-15 ENCOUNTER — Ambulatory Visit (INDEPENDENT_AMBULATORY_CARE_PROVIDER_SITE_OTHER): Payer: Medicare Other | Admitting: Internal Medicine

## 2012-10-15 VITALS — BP 143/71 | HR 57 | Temp 98.0°F | Resp 18 | Ht 67.0 in | Wt 198.0 lb

## 2012-10-15 DIAGNOSIS — E119 Type 2 diabetes mellitus without complications: Secondary | ICD-10-CM

## 2012-10-15 DIAGNOSIS — R079 Chest pain, unspecified: Secondary | ICD-10-CM

## 2012-10-15 DIAGNOSIS — R0781 Pleurodynia: Secondary | ICD-10-CM

## 2012-10-15 DIAGNOSIS — R109 Unspecified abdominal pain: Secondary | ICD-10-CM

## 2012-10-15 LAB — POCT CBC
Granulocyte percent: 51.7 %G (ref 37–80)
HCT, POC: 43.7 % (ref 43.5–53.7)
Hemoglobin: 13.8 g/dL — AB (ref 14.1–18.1)
Lymph, poc: 2.8 (ref 0.6–3.4)
MCH, POC: 27.2 pg (ref 27–31.2)
MCHC: 31.6 g/dL — AB (ref 31.8–35.4)
MCV: 86.1 fL (ref 80–97)
MID (cbc): 0.6 (ref 0–0.9)
MPV: 8.9 fL (ref 0–99.8)
POC Granulocyte: 3.6 (ref 2–6.9)
POC LYMPH PERCENT: 39.9 %L (ref 10–50)
POC MID %: 8.4 %M (ref 0–12)
Platelet Count, POC: 277 10*3/uL (ref 142–424)
RBC: 5.08 M/uL (ref 4.69–6.13)
RDW, POC: 16.5 %
WBC: 7 10*3/uL (ref 4.6–10.2)

## 2012-10-15 LAB — POCT URINALYSIS DIPSTICK
Bilirubin, UA: NEGATIVE
Blood, UA: NEGATIVE
Glucose, UA: NEGATIVE
Ketones, UA: NEGATIVE
Leukocytes, UA: NEGATIVE
Nitrite, UA: NEGATIVE
Protein, UA: 100
Spec Grav, UA: >=1.03
Urobilinogen, UA: 0.2
pH, UA: 5.5

## 2012-10-15 LAB — POCT UA - MICROSCOPIC ONLY
Bacteria, U Microscopic: NEGATIVE
Casts, Ur, LPF, POC: NEGATIVE
Crystals, Ur, HPF, POC: NEGATIVE
Epithelial cells, urine per micros: NEGATIVE
Yeast, UA: NEGATIVE

## 2012-10-15 LAB — POCT SEDIMENTATION RATE: POCT SED RATE: 26 mm/hr — AB (ref 0–22)

## 2012-10-15 MED ORDER — HYDROCODONE-ACETAMINOPHEN 5-325 MG PO TABS: 1.0000 | ORAL_TABLET | Freq: Four times a day (QID) | ORAL | Status: DC | PRN

## 2012-10-15 NOTE — Patient Instructions (Addendum)
Pleurisy Pleurisy is an inflammation and swelling of the lining of the lungs. It usually is the result of an underlying infection or other disease. Because of this inflammation, it hurts to breathe. It is aggravated by coughing or deep breathing. The primary goal in treating pleurisy is to diagnose and treat the condition that caused it.  HOME CARE INSTRUCTIONS   Only take over-the-counter or prescription medicines for pain, discomfort, or fever as directed by your caregiver.  If medications which kill germs (antibiotics) were prescribed, take the entire course. Even if you are feeling better, you need to take them.  Use a cool mist vaporizer to help loosen secretions. This is so the secretions can be coughed up more easily. SEEK MEDICAL CARE IF:   Your pain is not controlled with medication or is increasing.  You have an increase inpus like (purulent) secretions brought up with coughing. SEEK IMMEDIATE MEDICAL CARE IF:   You have blue or dark lips, fingernails, or toenails.  You begin coughing up blood.  You have increased difficulty breathing.  You have continuing pain unrelieved by medicine or lasting more than 1 week.  You have pain that radiates into your neck, arms, or jaw.  You develop increased shortness of breath or wheezing.  You develop a fever, rash, vomiting, fainting, or other serious complaints. Document Released: 08/21/2005 Document Revised: 11/13/2011 Document Reviewed: 03/22/2007 [facility] Patient Information 2013 [place], [place]. Pleurodynia Pleurodynia is a sharp pain in the muscles between your ribs (intercostal muscles). This condition makes it painful to breathe. Pleurodynia is sometimes described as an iron grip around the rib cage. Pleurodynia attacks are unpredictable. CAUSES  Pleurodynia is commonly caused by a viral infection. A virus, called coxsackievirus B, attacks the intercostal muscles. However, getting pleurodynia from this virus is rare. Most  people infected with coxsackievirus B have no symptoms. In some people, the virus causes a mild sore throat, cough, or diarrhea. Coxsackievirus B can live in body fluids, such as saliva and mucus. It is easily spread from person to person through coughing or sneezing. Coming in contact with the stool of an infected person can also spread the virus. SYMPTOMS  Symptoms usually start 3 to 6 days after you have been infected with the virus. Very bad chest pain is the main symptom of pleurodynia. The pain is usually felt on one side of the body, along the lower ribs. It starts suddenly and may last from a few seconds to 1 minute. It is hard to breathe when the pain strikes. You might feel pain again a few minutes or hours later. In most cases, the pain keeps coming back for 3 to 5 days. Then it goes away. In some cases, the pain keeps coming back every so often for up to 1 month. Other symptoms of pleurodynia may include:   Fever.  Rapid heartbeat.  Sore throat.  Cough.  Headache.  Stomach pain.  Nausea.  Vomiting.  Diarrhea.  Feeling very tired.  Rash.  For males, pain in the testicles. DIAGNOSIS  If you have had very bad chest pain, your caregiver will probably order some tests to determine whether you have pleurodynia. These tests may include:  A throat swab. Your caregiver may rub the back of your throat with a cotton swab. The cotton swab can then be tested for coxsackievirus B.  Urine and stool samples. These samples will be tested for coxsackievirus B.  Blood tests. These tests can tell if you have muscle damage.  Chest X-rays.  Electrocardiography (ECG). This test checks your heartbeat. TREATMENT  There is no treatment for an infection caused by coxsackievirus B. However, pleurodynia usually goes away on its own. It may take up to 1 month to fully recover. You may be given nonsteroidal anti-inflammatory drugs (NSAIDs) to control your pain. If your chest pain continues, you  may need to see a pain specialist to discuss possibly using nerve block injections to relieve your pain. HOME CARE INSTRUCTIONS  Only take over-the-counter or prescription medicines for pain, fever, or discomfort as directed by your caregiver.  Return to your regular activities slowly.  Wash your hands often. This helps prevent coxsackievirus B from spreading.  Do not smoke.  Keep all follow-up appointments as directed by your caregiver. SEEK MEDICAL CARE IF:  You have new symptoms.  Your symptoms are getting worse.  You develop a cough.  You have a sore throat.  You have a rash.  You have abdominal pain.  You vomit.  You have diarrhea. SEEK IMMEDIATE MEDICAL CARE IF:  You have very bad chest pain that is getting worse.  You have trouble breathing.  You have a fever. MAKE SURE YOU:  Understand these instructions.  Will watch your condition.  Will get help right away if you are not doing well or get worse. Document Released: 08/10/2011 Document Revised: 11/13/2011 Document Reviewed: 08/10/2011 [facility] Patient Information 2013 [place], [place].

## 2012-10-15 NOTE — Progress Notes (Signed)
  Subjective:    Patient ID: Matthew Zimmerman, male    DOB: Feb 24, 1946, 67 y.o.   MRN: [id number]  HPI C/o pain with no hx of injury to left lowest ribs posterior axillary line. Pain to twist, cough, sneeze, or roll pot of bed. Some minor pain with deep breath. No smoke hx, no cough, hemoptysis, no nite sweats, weight loss. No rash or fatigue Painful getting off exam table  Review of Systems     Objective:   Physical Exam  Constitutional: He is oriented to person, place, and time. He appears well-developed and well-nourished. No distress.  Neck: Normal range of motion. Neck supple.  Cardiovascular: Normal rate and normal heart sounds.   Pulmonary/Chest: Effort normal and breath sounds normal. He has no wheezes. He exhibits tenderness.  Abdominal: Soft. Bowel sounds are normal. He exhibits no mass. There is no tenderness.  Musculoskeletal: He exhibits tenderness.       Arms: Painfull to palpate  Neurological: He is alert and oriented to person, place, and time. He exhibits normal muscle tone. Coordination normal.  Skin: No rash noted.  Psychiatric: He has a normal mood and affect.   Results for orders placed in visit on 10/15/12  POCT CBC      Result Value Range   WBC 7.0  4.6 - 10.2 K/uL   Lymph, poc 2.8  0.6 - 3.4   POC LYMPH PERCENT 39.9  10 - 50 %L   MID (cbc) 0.6  0 - 0.9   POC MID % 8.4  0 - 12 %M   POC Granulocyte 3.6  2 - 6.9   Granulocyte percent 51.7  37 - 80 %G   RBC 5.08  4.69 - 6.13 M/uL   Hemoglobin 13.8 (*) 14.1 - 18.1 g/dL   HCT, POC 62.9  52.8 - 53.7 %   MCV 86.1  80 - 97 fL   MCH, POC 27.2  27 - 31.2 pg   MCHC 31.6 (*) 31.8 - 35.4 g/dL   RDW, POC 41.3     Platelet Count, POC 277  142 - 424 K/uL   MPV 8.9  0 - 99.8 fL  POCT UA - MICROSCOPIC ONLY      Result Value Range   WBC, Ur, HPF, POC 0-1     RBC, urine, microscopic 0-1     Bacteria, U Microscopic neg     Mucus, UA small     Epithelial cells, urine per micros neg     Crystals, Ur, HPF, POC neg      Casts, Ur, LPF, POC neg     Yeast, UA neg    POCT URINALYSIS DIPSTICK      Result Value Range   Color, UA yellow     Clarity, UA clear     Glucose, UA neg     Bilirubin, UA neg     Ketones, UA neg     Spec Grav, UA >=1.030     Blood, UA neg     pH, UA 5.5     Protein, UA 100     Urobilinogen, UA 0.2     Nitrite, UA neg     Leukocytes, UA Negative        [facility] reading (PRIMARY) by  Dr.Guest normal cxr and rib detail.      Assessment & Plan:  Left flank/left LB/and left upper quad rib pain posterior CT abdomen/lower left ribs

## 2012-10-16 ENCOUNTER — Ambulatory Visit (HOSPITAL_COMMUNITY)
Admission: RE | Admit: 2012-10-16 | Discharge: 2012-10-16 | Disposition: A | Discharge status: Home / Self Care | Payer: Medicare Other | Source: Ambulatory Visit | Attending: Internal Medicine | Admitting: Internal Medicine

## 2012-10-16 ENCOUNTER — Telehealth: Payer: Self-pay | Admitting: Family Medicine

## 2012-10-16 ENCOUNTER — Encounter: Payer: Self-pay | Admitting: *Deleted

## 2012-10-16 ENCOUNTER — Other Ambulatory Visit: Payer: Self-pay | Admitting: Internal Medicine

## 2012-10-16 DIAGNOSIS — R109 Unspecified abdominal pain: Secondary | ICD-10-CM | POA: Insufficient documentation

## 2012-10-16 DIAGNOSIS — R0781 Pleurodynia: Secondary | ICD-10-CM

## 2012-10-16 DIAGNOSIS — R079 Chest pain, unspecified: Secondary | ICD-10-CM | POA: Insufficient documentation

## 2012-10-16 DIAGNOSIS — Q619 Cystic kidney disease, unspecified: Secondary | ICD-10-CM | POA: Insufficient documentation

## 2012-10-16 DIAGNOSIS — M47817 Spondylosis without myelopathy or radiculopathy, lumbosacral region: Secondary | ICD-10-CM | POA: Insufficient documentation

## 2012-10-16 LAB — CREATININE, SERUM
Creatinine, Ser: 1.07 mg/dL (ref 0.50–1.35)
GFR calc Af Amer: 81 mL/min — ABNORMAL LOW (ref >90)
GFR calc non Af Amer: 70 mL/min — ABNORMAL LOW (ref >90)

## 2012-10-16 LAB — BUN: BUN: 13 mg/dL (ref 6–23)

## 2012-10-16 MED ORDER — IOHEXOL 300 MG/ML  SOLN
100.0000 mL | Freq: Once | INTRAMUSCULAR | Status: AC | PRN
  Administered 2012-10-16: 100 mL via INTRAVENOUS

## 2012-10-16 NOTE — Telephone Encounter (Signed)
Called [facility] medicare complete and got pre auth for CT pelvis. Already had pre cert for abd but needed to add pelvis. Called stephanie at [facility] and gave her the number pre auth number is [id number].

## 2012-10-20 ENCOUNTER — Encounter: Payer: Self-pay | Admitting: Radiology

## 2012-10-24 ENCOUNTER — Telehealth: Payer: Self-pay

## 2012-10-24 NOTE — Telephone Encounter (Signed)
Patient wife is calling for test results please call [phone]

## 2012-10-24 NOTE — Telephone Encounter (Signed)
CT Scan normal. Dr Perrin Maltese sent letter. Called patient to advise also reviewed labs. Sed rate 26, advised wife Dr Perrin Maltese was not concerned about this. Also reviewed GFR in detail.

## 2014-05-19 ENCOUNTER — Emergency Department (HOSPITAL_COMMUNITY): Payer: Commercial Managed Care - HMO

## 2014-05-19 ENCOUNTER — Observation Stay (HOSPITAL_COMMUNITY)
Admission: EM | Admit: 2014-05-19 | Discharge: 2014-05-22 | Disposition: A | Discharge status: Home / Self Care | Payer: Commercial Managed Care - HMO | Attending: Internal Medicine | Admitting: Internal Medicine

## 2014-05-19 ENCOUNTER — Encounter (HOSPITAL_COMMUNITY): Payer: Self-pay | Admitting: Emergency Medicine

## 2014-05-19 DIAGNOSIS — Z86711 Personal history of pulmonary embolism: Secondary | ICD-10-CM | POA: Insufficient documentation

## 2014-05-19 DIAGNOSIS — R0781 Pleurodynia: Secondary | ICD-10-CM

## 2014-05-19 DIAGNOSIS — T45515A Adverse effect of anticoagulants, initial encounter: Secondary | ICD-10-CM

## 2014-05-19 DIAGNOSIS — M542 Cervicalgia: Secondary | ICD-10-CM | POA: Diagnosis present

## 2014-05-19 DIAGNOSIS — K219 Gastro-esophageal reflux disease without esophagitis: Secondary | ICD-10-CM | POA: Insufficient documentation

## 2014-05-19 DIAGNOSIS — E785 Hyperlipidemia, unspecified: Secondary | ICD-10-CM | POA: Insufficient documentation

## 2014-05-19 DIAGNOSIS — M545 Low back pain, unspecified: Secondary | ICD-10-CM | POA: Diagnosis not present

## 2014-05-19 DIAGNOSIS — M549 Dorsalgia, unspecified: Secondary | ICD-10-CM | POA: Diagnosis present

## 2014-05-19 DIAGNOSIS — I10 Essential (primary) hypertension: Secondary | ICD-10-CM | POA: Diagnosis not present

## 2014-05-19 DIAGNOSIS — Z888 Allergy status to other drugs, medicaments and biological substances status: Secondary | ICD-10-CM | POA: Insufficient documentation

## 2014-05-19 DIAGNOSIS — M4802 Spinal stenosis, cervical region: Secondary | ICD-10-CM | POA: Diagnosis not present

## 2014-05-19 DIAGNOSIS — Z7901 Long term (current) use of anticoagulants: Secondary | ICD-10-CM | POA: Insufficient documentation

## 2014-05-19 DIAGNOSIS — E119 Type 2 diabetes mellitus without complications: Secondary | ICD-10-CM | POA: Diagnosis not present

## 2014-05-19 DIAGNOSIS — R109 Unspecified abdominal pain: Secondary | ICD-10-CM

## 2014-05-19 DIAGNOSIS — D6832 Hemorrhagic disorder due to extrinsic circulating anticoagulants: Secondary | ICD-10-CM

## 2014-05-19 HISTORY — DX: Other pulmonary embolism without acute cor pulmonale: I26.99

## 2014-05-19 LAB — CBC WITH DIFFERENTIAL/PLATELET
Basophils Absolute: 0 10*3/uL (ref 0.0–0.1)
Basophils Relative: 0 % (ref 0–1)
Eosinophils Absolute: 0.1 10*3/uL (ref 0.0–0.7)
Eosinophils Relative: 1 % (ref 0–5)
HCT: 38.4 % — ABNORMAL LOW (ref 39.0–52.0)
Hemoglobin: 12.7 g/dL — ABNORMAL LOW (ref 13.0–17.0)
Lymphocytes Relative: 27 % (ref 12–46)
Lymphs Abs: 2.6 10*3/uL (ref 0.7–4.0)
MCH: 27.4 pg (ref 26.0–34.0)
MCHC: 33.1 g/dL (ref 30.0–36.0)
MCV: 82.8 fL (ref 78.0–100.0)
Monocytes Absolute: 1.1 10*3/uL — ABNORMAL HIGH (ref 0.1–1.0)
Monocytes Relative: 11 % (ref 3–12)
Neutro Abs: 6 10*3/uL (ref 1.7–7.7)
Neutrophils Relative %: 61 % (ref 43–77)
Platelets: 270 10*3/uL (ref 150–400)
RBC: 4.64 MIL/uL (ref 4.22–5.81)
RDW: 15.6 % — ABNORMAL HIGH (ref 11.5–15.5)
WBC: 9.7 10*3/uL (ref 4.0–10.5)

## 2014-05-19 LAB — I-STAT CHEM 8, ED
BUN: 16 mg/dL (ref 6–23)
Calcium, Ion: 1.21 mmol/L (ref 1.13–1.30)
Chloride: 102 mEq/L (ref 96–112)
Creatinine, Ser: 1.3 mg/dL (ref 0.50–1.35)
Glucose, Bld: 48 mg/dL — ABNORMAL LOW (ref 70–99)
HCT: 43 % (ref 39.0–52.0)
Hemoglobin: 14.6 g/dL (ref 13.0–17.0)
Potassium: 4.2 mEq/L (ref 3.7–5.3)
Sodium: 140 mEq/L (ref 137–147)
TCO2: 27 mmol/L (ref 0–100)

## 2014-05-19 LAB — BASIC METABOLIC PANEL
Anion gap: 15 (ref 5–15)
BUN: 16 mg/dL (ref 6–23)
CO2: 25 mEq/L (ref 19–32)
Calcium: 9.5 mg/dL (ref 8.4–10.5)
Chloride: 101 mEq/L (ref 96–112)
Creatinine, Ser: 1.3 mg/dL (ref 0.50–1.35)
GFR calc Af Amer: 63 mL/min — ABNORMAL LOW (ref >90)
GFR calc non Af Amer: 54 mL/min — ABNORMAL LOW (ref >90)
Glucose, Bld: 47 mg/dL — ABNORMAL LOW (ref 70–99)
Potassium: 4.3 mEq/L (ref 3.7–5.3)
Sodium: 141 mEq/L (ref 137–147)

## 2014-05-19 LAB — URINE MICROSCOPIC-ADD ON

## 2014-05-19 LAB — URINALYSIS, ROUTINE W REFLEX MICROSCOPIC
Glucose, UA: NEGATIVE mg/dL
Hgb urine dipstick: NEGATIVE
Ketones, ur: 15 mg/dL — AB
Leukocytes, UA: NEGATIVE
Nitrite: NEGATIVE
Protein, ur: 100 mg/dL — AB
Specific Gravity, Urine: 1.029 (ref 1.005–1.030)
Urobilinogen, UA: 1 mg/dL (ref 0.0–1.0)
pH: 5 (ref 5.0–8.0)

## 2014-05-19 LAB — I-STAT TROPONIN, ED: Troponin i, poc: 0 ng/mL (ref 0.00–0.08)

## 2014-05-19 LAB — PROTIME-INR
INR: 2.05 — ABNORMAL HIGH (ref 0.00–1.49)
Prothrombin Time: 23.1 seconds — ABNORMAL HIGH (ref 11.6–15.2)

## 2014-05-19 LAB — CBG MONITORING, ED
Glucose-Capillary: 100 mg/dL — ABNORMAL HIGH (ref 70–99)
Glucose-Capillary: 64 mg/dL — ABNORMAL LOW (ref 70–99)

## 2014-05-19 LAB — TROPONIN I: Troponin I: <0.3 ng/mL (ref <0.30)

## 2014-05-19 LAB — D-DIMER, QUANTITATIVE (NOT AT ARMC): D-Dimer, Quant: <0.27 ug/mL-FEU (ref 0.00–0.48)

## 2014-05-19 MED ORDER — DIAZEPAM 5 MG PO TABS
5.0000 mg | ORAL_TABLET | Freq: Once | ORAL | Status: AC
  Administered 2014-05-20: 5 mg via ORAL
  Filled 2014-05-19: qty 1

## 2014-05-19 MED ORDER — IOHEXOL 350 MG/ML SOLN
100.0000 mL | Freq: Once | INTRAVENOUS | Status: AC | PRN
  Administered 2014-05-19: 100 mL via INTRAVENOUS

## 2014-05-19 MED ORDER — FENTANYL CITRATE 0.05 MG/ML IJ SOLN
50.0000 ug | Freq: Once | INTRAMUSCULAR | Status: AC
  Administered 2014-05-19: 50 ug via INTRAVENOUS
  Filled 2014-05-19: qty 2

## 2014-05-19 MED ORDER — DEXTROSE 50 % IV SOLN
50.0000 mL | Freq: Once | INTRAVENOUS | Status: AC
Start: 2014-05-19 — End: 2014-05-19
  Administered 2014-05-19: 50 mL via INTRAVENOUS
  Filled 2014-05-19: qty 50

## 2014-05-19 MED ORDER — OXYCODONE-ACETAMINOPHEN 5-325 MG PO TABS
1.0000 | ORAL_TABLET | Freq: Once | ORAL | Status: AC
  Administered 2014-05-19: 1 via ORAL
  Filled 2014-05-19: qty 1

## 2014-05-19 NOTE — ED Notes (Signed)
Pt c/o neck, back and right side pain since friday. Pt sts he is coming from Dr. Eugenio Hoes office was sent here to have a CT scan. Denies recent falls/injury. Reports with movement he gets a very sharp pain in lateral right back. Denies urinary/bowel incontinence. Nad, skin warm and dry, resp e/u.

## 2014-05-19 NOTE — ED Notes (Signed)
MD informed pt is requesting pain medication.  

## 2014-05-19 NOTE — ED Provider Notes (Signed)
CSN: [id number]     Arrival date & time 05/19/14  1318 History   First MD Initiated Contact with Patient 05/19/14 1806     Chief Complaint  Patient presents with  . Back Pain     (Consider location/radiation/quality/duration/timing/severity/associated sxs/prior Treatment) HPI Comments: Patient from PCPs office with right-sided low back pain for the past 4 days. Pain is intermittent and comes and goes it is worse with movement. Denies any falls or trauma. Denies any dysuria hematuria. No bowel or  bladder incontinence. No fever or vomiting. No chest pain or shortness of breath. Denies any testicular pain, focal weakness, numbness or tingling. Sent from PCPs office who wanted to rule out retroperitoneal hemorrhage as patient is on Coumadin. Denies any falls or trauma. Patient also complains of pain in the left side of his neck that is worse with movement.   He woke up with this pain this morning and it has gotten progressively worse. He is not taking anything at home for the pain.  The history is provided by the patient.    Past Medical History  Diagnosis Date  . Diabetes mellitus without complication   . GERD (gastroesophageal reflux disease)   . Hyperlipidemia   . Carpal tunnel syndrome on both sides   . PE (pulmonary embolism)    Past Surgical History  Procedure Laterality Date  . Spine surgery  09/04/1985    lumbar spine  . Elbow surgery      BONE SPURS REMOVED B ELBOWS  . Foot surgery      HEEL SPURS REMOVED B HEELS  . Toe surgery     No family history on file. History  Substance Use Topics  . Smoking status: Former Research scientist (life sciences)  . Smokeless tobacco: Former Systems developer  . Alcohol Use: No    Review of Systems  Constitutional: Negative for fever, activity change and appetite change.  HENT: Negative for congestion and rhinorrhea.   Respiratory: Negative for cough, chest tightness and shortness of breath.   Cardiovascular: Negative for chest pain and palpitations.  Gastrointestinal:  Negative for nausea, vomiting and abdominal pain.  Genitourinary: Negative for dysuria and hematuria.  Musculoskeletal: Positive for back pain. Negative for arthralgias and myalgias.  Skin: Negative for pallor.  Neurological: Negative for dizziness, tremors and headaches.  Hematological: Negative for adenopathy.  A complete 10 system review of systems was obtained and all systems are negative except as noted in the HPI and PMH.      Allergies  Cortisone  Home Medications   Prior to Admission medications   Medication Sig Start Date End Date Taking? Authorizing Provider  esomeprazole (NEXIUM) 40 MG capsule Take 40 mg by mouth at bedtime.   Yes Historical Provider, MD  glipiZIDE-metformin (METAGLIP) 2.5-500 MG per tablet Take 2 tablets by mouth 2 (two) times daily. 05/11/14  Yes Historical Provider, MD  HYDROcodone-acetaminophen (NORCO/VICODIN) 5-325 MG per tablet Take 1 tablet by mouth every 6 (six) hours as needed for pain. 10/15/12  Yes Orma Flaming, MD  insulin NPH-insulin regular (NOVOLIN 70/30) (70-30) 100 UNIT/ML injection Inject 30 Units into the skin 2 (two) times daily with a meal.    Yes Historical Provider, MD  rosuvastatin (CRESTOR) 10 MG tablet Take 10 mg by mouth at bedtime.    Yes Historical Provider, MD  simethicone (MYLICON) 992 MG chewable tablet Chew 125 mg by mouth every 6 (six) hours as needed for flatulence.   Yes Historical Provider, MD  warfarin (COUMADIN) 2.5 MG tablet Take 5-7.5 mg  by mouth daily at 6 PM. Takes 7.5mg  on tues, thurs and sat  Takes 5mg  all other days   Yes Historical Provider, MD   BP 125/56  Pulse 76  Temp(Src) 98.4 F (36.9 C) (Oral)  Resp 13  Ht 5\' 8"  (1.727 m)  Wt 200 lb (90.719 kg)  BMI 30.42 kg/m2  SpO2 93% Physical Exam  Nursing note and vitals reviewed. Constitutional: He is oriented to person, place, and time. He appears well-developed and well-nourished. No distress.  HENT:  Head: Normocephalic and atraumatic.  Mouth/Throat:  Oropharynx is clear and moist. No oropharyngeal exudate.  Eyes: Conjunctivae and EOM are normal. Pupils are equal, round, and reactive to light.  Neck: Normal range of motion.  L paraspinal tenderness, pain with ROM, moderately reduced ROM  Cardiovascular: Normal rate, regular rhythm, normal heart sounds and intact distal pulses.   No murmur heard. Pulmonary/Chest: Effort normal and breath sounds normal. No respiratory distress.  Abdominal: Soft. There is no tenderness. There is no rebound and no guarding.  Musculoskeletal: Normal range of motion. He exhibits tenderness. He exhibits no edema.  R paraspinal lumbar tenderness. weell healed midline incision  5/5 strength in bilateral lower extremities. Ankle plantar and dorsiflexion intact. Great toe extension intact bilaterally. +2 DP and PT pulses. +2 patellar reflexes bilaterally. Normal gait.   Neurological: He is alert and oriented to person, place, and time. No cranial nerve deficit. He exhibits normal muscle tone. Coordination normal.  No ataxia on finger to nose bilaterally. No pronator drift. 5/5 strength throughout. CN 2-12 intact. Negative Romberg. Equal grip strength. Sensation intact. Gait is normal.   Skin: Skin is warm.  Psychiatric: He has a normal mood and affect. His behavior is normal.    ED Course  Procedures (including critical care time) Labs Review Labs Reviewed  CBC WITH DIFFERENTIAL - Abnormal; Notable for the following:    Hemoglobin 12.7 (*)    HCT 38.4 (*)    RDW 15.6 (*)    Monocytes Absolute 1.1 (*)    All other components within normal limits  BASIC METABOLIC PANEL - Abnormal; Notable for the following:    Glucose, Bld 47 (*)    GFR calc non Af Amer 54 (*)    GFR calc Af Amer 63 (*)    All other components within normal limits  URINALYSIS, ROUTINE W REFLEX MICROSCOPIC - Abnormal; Notable for the following:    Color, Urine AMBER (*)    Bilirubin Urine SMALL (*)    Ketones, ur 15 (*)    Protein, ur 100  (*)    All other components within normal limits  PROTIME-INR - Abnormal; Notable for the following:    Prothrombin Time 23.1 (*)    INR 2.05 (*)    All other components within normal limits  URINE MICROSCOPIC-ADD ON - Abnormal; Notable for the following:    Casts HYALINE CASTS (*)    All other components within normal limits  I-STAT CHEM 8, ED - Abnormal; Notable for the following:    Glucose, Bld 48 (*)    All other components within normal limits  CBG MONITORING, ED - Abnormal; Notable for the following:    Glucose-Capillary 100 (*)    All other components within normal limits  CBG MONITORING, ED - Abnormal; Notable for the following:    Glucose-Capillary 64 (*)    All other components within normal limits  CBG MONITORING, ED - Abnormal; Notable for the following:    Glucose-Capillary 133 (*)  All other components within normal limits  TROPONIN I  D-DIMER, QUANTITATIVE  I-STAT TROPOININ, ED  CBG MONITORING, ED    Imaging Review Ct Angio Head W/cm &/or Wo Cm  05/20/2014   CLINICAL DATA:  Head and neck pain.  EXAM: CT ANGIOGRAPHY HEAD AND NECK  TECHNIQUE: Multidetector CT imaging of the head and neck was performed using the standard protocol during bolus administration of intravenous contrast. Multiplanar CT image reconstructions and MIPs were obtained to evaluate the vascular anatomy. Carotid stenosis measurements (when applicable) are obtained utilizing NASCET criteria, using the distal internal carotid diameter as the denominator.  CONTRAST:  50 cc Omnipaque 350  COMPARISON:  None available for comparison at time of study interpretation.  FINDINGS: CTA HEAD FINDINGS  Anterior circulation: Normal appearance of the cervical internal carotid arteries, petrous, cavernous and supra clinoid internal carotid arteries. Widely patent anterior communicating artery. Normal appearance of the anterior and middle cerebral arteries.  Posterior circulation: LEFT vertebral artery is dominant with  normal appearance of the vertebral arteries, vertebrobasilar junction and basilar artery, as well as main branch vessels. Normal appearance of the posterior cerebral arteries. RIGHT vertebral artery predominantly terminates in the RIGHT posterior-inferior cerebellar artery.  No large vessel occlusion, hemodynamically significant stenosis, dissection, luminal irregularity, contrast extravasation or aneurysm within the anterior nor posterior circulation. Tiny bilateral posterior communicating arteries present.  Patient is nearly edentulous. Ventricles and sulci are normal for patient's age. No midline shift or mass effect. No abnormal parenchymal or leptomeningeal enhancement.  Review of the MIP images confirms the above findings.  CTA NECK FINDINGS  Normal appearance of the thoracic arch, normal branch pattern, trace calcific atherosclerosis. The origins of the innominate, left Common carotid artery and subclavian artery are widely patent.  Bilateral Common carotid arteries are widely patent, coursing in a straight line fashion. Normal appearance of the carotid bifurcations without hemodynamically significant stenosis by NASCET criteria. Normal appearance of the included internal carotid arteries.  Left vertebral artery is dominant. Normal appearance of the vertebral arteries, which appear widely patent. Mild extrinsic compression due to uncovertebral hypertrophy and facet arthropathy.  No hemodynamically significant stenosis by NASCET criteria. No dissection, no pseudoaneurysm. No abnormal luminal irregularity. No contrast extravasation.  Soft tissues are normal. Moderate to severe cervical spondylosis with severe C5-6 neural foraminal narrowing, partially characterized on submitted bone windows.  Review of the MIP images confirms the above findings.  IMPRESSION: CTA head: No acute vascular process nor hemodynamically significant stenosis.  CTA neck: No acute vascular process nor hemodynamically significant  stenosis.   Electronically Signed   By: Elon Alas   On: 05/20/2014 00:35   Dg Chest 2 View  05/19/2014   CLINICAL DATA:  Four-day history of flank pain ; history of diabetes and hypertension and pulmonary embolism  EXAM: CHEST  2 VIEW  COMPARISON:  Chest x-ray of October 15, 2012  FINDINGS: The lungs are borderline hypoinflated. There is no focal infiltrate. The cardiac silhouette is top-normal in size. The pulmonary vascularity is not engorged. There is mild tortuosity of the descending thoracic aorta. There is no pleural effusion. There is calcification of the anterior longitudinal ligament of the thoracic spine.  IMPRESSION: There is no acute cardiopulmonary abnormality.   Electronically Signed   By: David  Martinique   On: 05/19/2014 16:38   Ct Angio Neck W/cm &/or Wo/cm  05/20/2014   CLINICAL DATA:  Head and neck pain.  EXAM: CT ANGIOGRAPHY HEAD AND NECK  TECHNIQUE: Multidetector CT imaging of the  head and neck was performed using the standard protocol during bolus administration of intravenous contrast. Multiplanar CT image reconstructions and MIPs were obtained to evaluate the vascular anatomy. Carotid stenosis measurements (when applicable) are obtained utilizing NASCET criteria, using the distal internal carotid diameter as the denominator.  CONTRAST:  50 cc Omnipaque 350  COMPARISON:  None available for comparison at time of study interpretation.  FINDINGS: CTA HEAD FINDINGS  Anterior circulation: Normal appearance of the cervical internal carotid arteries, petrous, cavernous and supra clinoid internal carotid arteries. Widely patent anterior communicating artery. Normal appearance of the anterior and middle cerebral arteries.  Posterior circulation: LEFT vertebral artery is dominant with normal appearance of the vertebral arteries, vertebrobasilar junction and basilar artery, as well as main branch vessels. Normal appearance of the posterior cerebral arteries. RIGHT vertebral artery  predominantly terminates in the RIGHT posterior-inferior cerebellar artery.  No large vessel occlusion, hemodynamically significant stenosis, dissection, luminal irregularity, contrast extravasation or aneurysm within the anterior nor posterior circulation. Tiny bilateral posterior communicating arteries present.  Patient is nearly edentulous. Ventricles and sulci are normal for patient's age. No midline shift or mass effect. No abnormal parenchymal or leptomeningeal enhancement.  Review of the MIP images confirms the above findings.  CTA NECK FINDINGS  Normal appearance of the thoracic arch, normal branch pattern, trace calcific atherosclerosis. The origins of the innominate, left Common carotid artery and subclavian artery are widely patent.  Bilateral Common carotid arteries are widely patent, coursing in a straight line fashion. Normal appearance of the carotid bifurcations without hemodynamically significant stenosis by NASCET criteria. Normal appearance of the included internal carotid arteries.  Left vertebral artery is dominant. Normal appearance of the vertebral arteries, which appear widely patent. Mild extrinsic compression due to uncovertebral hypertrophy and facet arthropathy.  No hemodynamically significant stenosis by NASCET criteria. No dissection, no pseudoaneurysm. No abnormal luminal irregularity. No contrast extravasation.  Soft tissues are normal. Moderate to severe cervical spondylosis with severe C5-6 neural foraminal narrowing, partially characterized on submitted bone windows.  Review of the MIP images confirms the above findings.  IMPRESSION: CTA head: No acute vascular process nor hemodynamically significant stenosis.  CTA neck: No acute vascular process nor hemodynamically significant stenosis.   Electronically Signed   By: Elon Alas   On: 05/20/2014 00:35   Ct Angio Chest Aortic Dissect W &/or W/o  05/19/2014   CLINICAL DATA:  Right flank pain and back pain.  EXAM: CT  ANGIOGRAPHY CHEST AND ABDOMEN  TECHNIQUE: Multidetector CT imaging of the chest and abdomen was performed using the standard protocol during bolus administration of intravenous contrast. Multiplanar CT image reconstructions and MIPs were obtained to evaluate the vascular anatomy.  CONTRAST:  196mL OMNIPAQUE IOHEXOL 350 MG/ML SOLN  COMPARISON:  Chest x-ray on 05/19/2014  FINDINGS: CTA CHEST FINDINGS  Heart:  The heart is mildly enlarged.  No pericardial effusion.  Vascular structures: Normal arch anatomy. No evidence for aneurysm or dissection.  Mediastinum/thyroid: No mediastinal, hilar, or axillary adenopathy. The visualized portion of the thyroid gland has a normal appearance.  Lungs: No focal consolidations or pleural effusions. There is dependent change at both lung bases. Lingular atelectasis.  Chest wall/osseous structures: Moderate degenerative changes. No suspicious lesions.  Review of the MIP images confirms the above findings.  CTA ABDOMEN FINDINGS  Upper abdomen: No focal abnormality identified within the liver, spleen, pancreas, or adrenal glands. Bilateral renal cysts are present. The gallbladder is present.  Bowel: The stomach and small bowel loops are normal in  appearance. The appendix is well seen and has a normal appearance. Colonic loops are normal in appearance.  Pelvis: The urinary bladder has a normal appearance. The prostate gland appears prominent. No free pelvic fluid.  Retroperitoneum: The abdominal aorta is not aneurysmal. No evidence for dissection. There is normal opacification of the renal arteries, celiac axis, superior mesenteric, and inferior mesenteric arteries. Normal iliac arteries.  Abdominal wall: Unremarkable.  Osseous structures: Degenerative changes are present.  Review of the MIP images confirms the above findings.  IMPRESSION: 1. No evidence for a dissection or aneurysm. 2. Mild dependent change in atelectasis at the lung bases. 3. Normal appendix. 4. Normal appearance of  the aortic branches.   Electronically Signed   By: Shon Hale M.D.   On: 05/19/2014 23:34   Ct Cta Abd/pel W/cm &/or W/o Cm  05/19/2014   CLINICAL DATA:  Right flank pain and back pain.  EXAM: CT ANGIOGRAPHY CHEST AND ABDOMEN  TECHNIQUE: Multidetector CT imaging of the chest and abdomen was performed using the standard protocol during bolus administration of intravenous contrast. Multiplanar CT image reconstructions and MIPs were obtained to evaluate the vascular anatomy.  CONTRAST:  135mL OMNIPAQUE IOHEXOL 350 MG/ML SOLN  COMPARISON:  Chest x-ray on 05/19/2014  FINDINGS: CTA CHEST FINDINGS  Heart:  The heart is mildly enlarged.  No pericardial effusion.  Vascular structures: Normal arch anatomy. No evidence for aneurysm or dissection.  Mediastinum/thyroid: No mediastinal, hilar, or axillary adenopathy. The visualized portion of the thyroid gland has a normal appearance.  Lungs: No focal consolidations or pleural effusions. There is dependent change at both lung bases. Lingular atelectasis.  Chest wall/osseous structures: Moderate degenerative changes. No suspicious lesions.  Review of the MIP images confirms the above findings.  CTA ABDOMEN FINDINGS  Upper abdomen: No focal abnormality identified within the liver, spleen, pancreas, or adrenal glands. Bilateral renal cysts are present. The gallbladder is present.  Bowel: The stomach and small bowel loops are normal in appearance. The appendix is well seen and has a normal appearance. Colonic loops are normal in appearance.  Pelvis: The urinary bladder has a normal appearance. The prostate gland appears prominent. No free pelvic fluid.  Retroperitoneum: The abdominal aorta is not aneurysmal. No evidence for dissection. There is normal opacification of the renal arteries, celiac axis, superior mesenteric, and inferior mesenteric arteries. Normal iliac arteries.  Abdominal wall: Unremarkable.  Osseous structures: Degenerative changes are present.  Review of the MIP  images confirms the above findings.  IMPRESSION: 1. No evidence for a dissection or aneurysm. 2. Mild dependent change in atelectasis at the lung bases. 3. Normal appendix. 4. Normal appearance of the aortic branches.   Electronically Signed   By: Shon Hale M.D.   On: 05/19/2014 23:34     EKG Interpretation   Date/Time:  Tuesday May 19 2014 18:43:28 EDT Ventricular Rate:  66 PR Interval:  208 QRS Duration: 140 QT Interval:  418 QTC Calculation: 438 R Axis:   -56 Text Interpretation:  Sinus rhythm Right bundle branch block LVH by  voltage No previous ECGs available Confirmed by [place]  [phone]) on 05/19/2014 6:49:52 PM      MDM   Final diagnoses:  Neck pain   Four day history of right-sided low back pain that comes and goes. Sent from PCPs office with concern for retroperitoneal hematoma. Denies any falls or injury.  Neurologically intact. No evidence of cauda equina or cord compression. UA is negative. Chest x-ray negative. INR  therapeutic at 2.0. D-dimer negative. Doubt acute PE Hyperglycemia improved with eating.  CT negative for dissection or pulmonary embolism. No evidence of retroperitoneal hematoma. CTA head and neck negative.  Neck pain persists with reduced ROM. Likely musculoskeletal.  No fever or leukocytosis, no AMS.  Doubt meningitis.  Patient on coumadin and unsafe to LP.  Clinical suspicion for meningitis is low.  Patient appears well.. However neck pain persists and uncontrolled.  D/w Dr. Arnoldo Morale who will admit for observation.  Ezequiel Essex, MD 05/20/14 [phone]

## 2014-05-19 NOTE — ED Notes (Signed)
CBG results: 64

## 2014-05-19 NOTE — ED Notes (Signed)
CBG 100 

## 2014-05-19 NOTE — ED Notes (Signed)
Paged pt for recheck of vitals and lab draw with no response.

## 2014-05-19 NOTE — ED Notes (Signed)
Unable to gain IV access, Lorre Nick. 2nd Rn attempting IV access

## 2014-05-19 NOTE — ED Notes (Signed)
Pt given [place] sandwich, apple juice, and crackers per MD approval.

## 2014-05-20 DIAGNOSIS — D689 Coagulation defect, unspecified: Secondary | ICD-10-CM

## 2014-05-20 DIAGNOSIS — D6832 Hemorrhagic disorder due to extrinsic circulating anticoagulants: Secondary | ICD-10-CM | POA: Diagnosis present

## 2014-05-20 DIAGNOSIS — M542 Cervicalgia: Secondary | ICD-10-CM

## 2014-05-20 DIAGNOSIS — T45515A Adverse effect of anticoagulants, initial encounter: Secondary | ICD-10-CM

## 2014-05-20 DIAGNOSIS — E119 Type 2 diabetes mellitus without complications: Secondary | ICD-10-CM

## 2014-05-20 DIAGNOSIS — I10 Essential (primary) hypertension: Secondary | ICD-10-CM

## 2014-05-20 DIAGNOSIS — K219 Gastro-esophageal reflux disease without esophagitis: Secondary | ICD-10-CM

## 2014-05-20 DIAGNOSIS — M549 Dorsalgia, unspecified: Secondary | ICD-10-CM

## 2014-05-20 LAB — GLUCOSE, CAPILLARY
Glucose-Capillary: 91 mg/dL (ref 70–99)
Glucose-Capillary: 188 mg/dL — ABNORMAL HIGH (ref 70–99)
Glucose-Capillary: 206 mg/dL — ABNORMAL HIGH (ref 70–99)
Glucose-Capillary: 365 mg/dL — ABNORMAL HIGH (ref 70–99)

## 2014-05-20 LAB — CBC
HCT: 36.2 % — ABNORMAL LOW (ref 39.0–52.0)
Hemoglobin: 11.8 g/dL — ABNORMAL LOW (ref 13.0–17.0)
MCH: 27 pg (ref 26.0–34.0)
MCHC: 32.6 g/dL (ref 30.0–36.0)
MCV: 82.8 fL (ref 78.0–100.0)
Platelets: 279 10*3/uL (ref 150–400)
RBC: 4.37 MIL/uL (ref 4.22–5.81)
RDW: 15.3 % (ref 11.5–15.5)
WBC: 8.3 10*3/uL (ref 4.0–10.5)

## 2014-05-20 LAB — BASIC METABOLIC PANEL
Anion gap: 11 (ref 5–15)
BUN: 14 mg/dL (ref 6–23)
CO2: 27 mEq/L (ref 19–32)
Calcium: 8.9 mg/dL (ref 8.4–10.5)
Chloride: 97 mEq/L (ref 96–112)
Creatinine, Ser: 1.12 mg/dL (ref 0.50–1.35)
GFR calc Af Amer: 76 mL/min — ABNORMAL LOW (ref >90)
GFR calc non Af Amer: 65 mL/min — ABNORMAL LOW (ref >90)
Glucose, Bld: 143 mg/dL — ABNORMAL HIGH (ref 70–99)
Potassium: 4 mEq/L (ref 3.7–5.3)
Sodium: 135 mEq/L — ABNORMAL LOW (ref 137–147)

## 2014-05-20 LAB — HEMOGLOBIN A1C
Hgb A1c MFr Bld: 7.2 % — ABNORMAL HIGH (ref <5.7)
Mean Plasma Glucose: 160 mg/dL — ABNORMAL HIGH (ref <117)

## 2014-05-20 LAB — CBG MONITORING, ED
Glucose-Capillary: 133 mg/dL — ABNORMAL HIGH (ref 70–99)
Glucose-Capillary: 90 mg/dL (ref 70–99)

## 2014-05-20 LAB — PROTIME-INR
INR: 2.21 — ABNORMAL HIGH (ref 0.00–1.49)
Prothrombin Time: 24.5 seconds — ABNORMAL HIGH (ref 11.6–15.2)

## 2014-05-20 MED ORDER — WARFARIN SODIUM 7.5 MG PO TABS
7.5000 mg | ORAL_TABLET | ORAL | Status: DC
  Administered 2014-05-21: 7.5 mg via ORAL
  Filled 2014-05-20: qty 1

## 2014-05-20 MED ORDER — ONDANSETRON HCL 4 MG PO TABS: 4.0000 mg | ORAL_TABLET | Freq: Four times a day (QID) | ORAL | Status: DC | PRN

## 2014-05-20 MED ORDER — ALUM & MAG HYDROXIDE-SIMETH 200-200-20 MG/5ML PO SUSP: 30.0000 mL | Freq: Four times a day (QID) | ORAL | Status: DC | PRN

## 2014-05-20 MED ORDER — ONDANSETRON HCL 4 MG/2ML IJ SOLN: 4.0000 mg | Freq: Three times a day (TID) | INTRAMUSCULAR | Status: DC | PRN

## 2014-05-20 MED ORDER — HYDROMORPHONE HCL PF 1 MG/ML IJ SOLN
1.0000 mg | Freq: Once | INTRAMUSCULAR | Status: AC
  Administered 2014-05-20: 1 mg via INTRAVENOUS
  Filled 2014-05-20: qty 1

## 2014-05-20 MED ORDER — ACETAMINOPHEN 325 MG PO TABS: 650.0000 mg | ORAL_TABLET | Freq: Four times a day (QID) | ORAL | Status: DC | PRN

## 2014-05-20 MED ORDER — INSULIN ASPART 100 UNIT/ML ~~LOC~~ SOLN
0.0000 [IU] | Freq: Three times a day (TID) | SUBCUTANEOUS | Status: DC
  Administered 2014-05-20: 3 [IU] via SUBCUTANEOUS
  Administered 2014-05-20 – 2014-05-21 (×2): 2 [IU] via SUBCUTANEOUS
  Administered 2014-05-21: 9 [IU] via SUBCUTANEOUS
  Administered 2014-05-21: 13 [IU] via SUBCUTANEOUS

## 2014-05-20 MED ORDER — HYDROMORPHONE HCL PF 1 MG/ML IJ SOLN: 0.5000 mg | INTRAMUSCULAR | Status: DC | PRN

## 2014-05-20 MED ORDER — WARFARIN SODIUM 5 MG PO TABS: 5.0000 mg | ORAL_TABLET | Freq: Every day | ORAL | Status: DC

## 2014-05-20 MED ORDER — INSULIN ASPART 100 UNIT/ML ~~LOC~~ SOLN: 0.0000 [IU] | Freq: Every day | SUBCUTANEOUS | Status: DC

## 2014-05-20 MED ORDER — INSULIN ASPART PROT & ASPART (70-30 MIX) 100 UNIT/ML ~~LOC~~ SUSP
25.0000 [IU] | Freq: Two times a day (BID) | SUBCUTANEOUS | Status: DC
  Filled 2014-05-20: qty 10

## 2014-05-20 MED ORDER — OXYCODONE HCL 5 MG PO TABS
5.0000 mg | ORAL_TABLET | ORAL | Status: DC | PRN
  Administered 2014-05-20 – 2014-05-22 (×6): 5 mg via ORAL
  Filled 2014-05-20 (×6): qty 1

## 2014-05-20 MED ORDER — PANTOPRAZOLE SODIUM 40 MG PO TBEC
40.0000 mg | DELAYED_RELEASE_TABLET | Freq: Every day | ORAL | Status: DC
  Administered 2014-05-20 – 2014-05-22 (×3): 40 mg via ORAL
  Filled 2014-05-20 (×3): qty 1

## 2014-05-20 MED ORDER — ATORVASTATIN CALCIUM 10 MG PO TABS
20.0000 mg | ORAL_TABLET | Freq: Every day | ORAL | Status: DC
  Administered 2014-05-20 – 2014-05-21 (×2): 20 mg via ORAL
  Filled 2014-05-20 (×2): qty 2

## 2014-05-20 MED ORDER — WARFARIN - PHYSICIAN DOSING INPATIENT
Freq: Every day | Status: DC
  Administered 2014-05-20: 18:00:00

## 2014-05-20 MED ORDER — METHOCARBAMOL 750 MG PO TABS: 750.0000 mg | ORAL_TABLET | Freq: Three times a day (TID) | ORAL | Status: DC | PRN

## 2014-05-20 MED ORDER — HYDROMORPHONE HCL PF 1 MG/ML IJ SOLN: 1.0000 mg | INTRAMUSCULAR | Status: DC | PRN

## 2014-05-20 MED ORDER — SODIUM CHLORIDE 0.9 % IV SOLN
INTRAVENOUS | Status: DC
  Administered 2014-05-20: 10 mL/h via INTRAVENOUS

## 2014-05-20 MED ORDER — ACETAMINOPHEN 650 MG RE SUPP: 650.0000 mg | Freq: Four times a day (QID) | RECTAL | Status: DC | PRN

## 2014-05-20 MED ORDER — ONDANSETRON HCL 4 MG/2ML IJ SOLN: 4.0000 mg | Freq: Four times a day (QID) | INTRAMUSCULAR | Status: DC | PRN

## 2014-05-20 MED ORDER — WARFARIN SODIUM 5 MG PO TABS
5.0000 mg | ORAL_TABLET | ORAL | Status: DC
  Administered 2014-05-20: 5 mg via ORAL
  Filled 2014-05-20: qty 1

## 2014-05-20 MED ORDER — DEXAMETHASONE 4 MG PO TABS
4.0000 mg | ORAL_TABLET | Freq: Two times a day (BID) | ORAL | Status: DC
  Administered 2014-05-20 – 2014-05-21 (×3): 4 mg via ORAL
  Filled 2014-05-20 (×3): qty 1

## 2014-05-20 NOTE — Progress Notes (Signed)
Utilization Review Completed.Matthew Zimmerman T9/16/2015  

## 2014-05-20 NOTE — H&P (Addendum)
[facility] Admission History and Physical       Matthew Zimmerman IZT:[id number] DOB: October 05, 1945 DOA: 05/19/2014  Referring physician:  EDP PCP: Kennon Portela, MD  Specialists:   Chief Complaint:  Severe Right Lower Back Pain and Neck Pain  HPI: Matthew Zimmerman is a 68 y.o. male with a history of IDDM, Hyperlipidemia, and recent Pulmonary Embolism on Coumadin rx who presents to the ED with complaints of severe pain in his right lower flank area x 1 week and increased pain in his neck that started in the AM upon awakening.     He denies having any trauma, and denies having any fever or chills.   He was seen by his PCP and sent to the ED to rule out a retroperitoneal hemorrhage since he is on Coumadin Rx.   He was evalauted in the ED and had a CTA of the head, C-spine and ABD /Pelvis which were performed and were negative for a retroperitoneal hemorrhage and also negative for pathology or etiology of his pain.  He was unrelieved by pain medication he received in the ED and was referred for admission.     Review of Systems:  Constitutional: No Weight Loss, No Weight Gain, Night Sweats, Fevers, Chills, Dizziness, Fatigue, or Generalized Weakness HEENT: No Headaches, Difficulty Swallowing,Tooth/Dental Problems,Sore Throat,  No Sneezing, Rhinitis, Ear Ache, Nasal Congestion, or Post Nasal Drip,  Cardio-vascular:  No Chest pain, Orthopnea, PND, Edema in Lower Extremities, Anasarca, Dizziness, Palpitations  Resp: No Dyspnea, No DOE, No Productive Cough, No Non-Productive Cough, No Hemoptysis, No Wheezing.    GI: No Heartburn, Indigestion, Abdominal Pain, Nausea, Vomiting, Diarrhea, Hematemesis, Hematochezia, Melena, Change in Bowel Habits,  Loss of Appetite  GU: No Dysuria, Change in Color of Urine, No Urgency or Frequency, No Flank pain.  Musculoskeletal: + Neck Pain, +Decreased Range of Motion of Neck, +Back Pain.  Neurologic: No Syncope, No Seizures, Muscle Weakness, Paresthesia,  Vision Disturbance or Loss, No Diplopia, No Vertigo, No Difficulty Walking,  Skin: No Rash or Lesions. Psych: No Change in Mood or Affect, No Depression or Anxiety, No Memory loss, No Confusion, or Hallucinations   Past Medical History  Diagnosis Date  . Diabetes mellitus without complication   . GERD (gastroesophageal reflux disease)   . Hyperlipidemia   . Carpal tunnel syndrome on both sides   . PE (pulmonary embolism)       Past Surgical History  Procedure Laterality Date  . Spine surgery  09/04/1985    lumbar spine  . Elbow surgery      BONE SPURS REMOVED B ELBOWS  . Foot surgery      HEEL SPURS REMOVED B HEELS  . Toe surgery         Prior to Admission medications   Medication Sig Start Date End Date Taking? Authorizing Provider  esomeprazole (NEXIUM) 40 MG capsule Take 40 mg by mouth at bedtime.   Yes Historical Provider, MD  glipiZIDE-metformin (METAGLIP) 2.5-500 MG per tablet Take 2 tablets by mouth 2 (two) times daily. 05/11/14  Yes Historical Provider, MD  HYDROcodone-acetaminophen (NORCO/VICODIN) 5-325 MG per tablet Take 1 tablet by mouth every 6 (six) hours as needed for pain. 10/15/12  Yes Orma Flaming, MD  insulin NPH-insulin regular (NOVOLIN 70/30) (70-30) 100 UNIT/ML injection Inject 30 Units into the skin 2 (two) times daily with a meal.    Yes Historical Provider, MD  rosuvastatin (CRESTOR) 10 MG tablet Take 10 mg by mouth at bedtime.    Yes  Historical Provider, MD  simethicone (MYLICON) 371 MG chewable tablet Chew 125 mg by mouth every 6 (six) hours as needed for flatulence.   Yes Historical Provider, MD  warfarin (COUMADIN) 2.5 MG tablet Take 5-7.5 mg by mouth daily at 6 PM. Takes 7.5mg  on tues, thurs and sat  Takes 5mg  all other days   Yes Historical Provider, MD      Allergies  Allergen Reactions  . Cortisone Other (See Comments)    CBG thrown out of control     Social History:  reports that he has quit smoking. He has quit using smokeless tobacco.  He reports that he does not drink alcohol or use illicit drugs.     No family history on file.     Physical Exam:  GEN:  Pleasant  68 y.o. male  examined  and in no acute distress; cooperative with exam Filed Vitals:   05/20/14 0145 05/20/14 0200 05/20/14 0224 05/20/14 0527  BP: 143/82 125/56 166/87 128/63  Pulse: 70 76 77 83  Temp:   99.9 F (37.7 C) 99.2 F (37.3 C)  TempSrc:   Oral Oral  Resp: 17 13 16 18   Height:      Weight:      SpO2: 95% 93% 98% 96%   Blood pressure 128/63, pulse 83, temperature 99.2 F (37.3 C), temperature source Oral, resp. rate 18, height 5\' 8"  (1.727 m), weight 90.719 kg (200 lb), SpO2 96.00%. PSYCH: He is alert and oriented x4; does not appear anxious does not appear depressed; affect is normal HEENT: Normocephalic and Atraumatic, Mucous membranes pink; PERRLA; EOM intact; Fundi:  Benign;  No scleral icterus, Nares: Patent, Oropharynx: Clear, Edentulous,    Neck:  Limited ROM, No Cervical Lymphadenopathy nor Thyromegaly or Carotid Bruit; No JVD; Breasts:: Not examined CHEST WALL: No tenderness CHEST: Normal respiration, clear to auscultation bilaterally HEART: Regular rate and rhythm; no murmurs rubs or gallops BACK: No kyphosis or scoliosis; No CVA tenderness; +Right Lower Flank and paraspinal Muscle Tenderness ABDOMEN: Positive Bowel Sounds, Obese, Soft Non-Tender; No Masses, No Organomegaly, No Pannus; No Intertriginous candida. Rectal Exam: Not done EXTREMITIES: No  Cyanosis, Clubbing, or Edema; No Ulcerations. Genitalia: not examined PULSES: 2+ and symmetric SKIN: Normal hydration no rash or ulceration CNS:    Alert and Oriented x 4, No Focal Deficits  Vascular: pulses palpable throughout    Labs on Admission:  Basic Metabolic Panel:  Recent Labs Lab 05/19/14 1744 05/19/14 1753  NA 141 140  K 4.3 4.2  CL 101 102  CO2 25  --   GLUCOSE 47* 48*  BUN 16 16  CREATININE 1.30 1.30  CALCIUM 9.5  --    Liver Function Tests: No  results found for this basename: AST, ALT, ALKPHOS, BILITOT, PROT, ALBUMIN,  in the last 168 hours No results found for this basename: LIPASE, AMYLASE,  in the last 168 hours No results found for this basename: AMMONIA,  in the last 168 hours CBC:  Recent Labs Lab 05/19/14 1744 05/19/14 1753  WBC 9.7  --   NEUTROABS 6.0  --   HGB 12.7* 14.6  HCT 38.4* 43.0  MCV 82.8  --   PLT 270  --    Cardiac Enzymes:  Recent Labs Lab 05/19/14 [place] <0.30    BNP (last 3 results) No results found for this basename: PROBNP,  in the last 8760 hours CBG:  Recent Labs Lab 05/19/14 2002 05/19/14 2112 05/19/14 2249 05/20/14 0034 05/20/14 [place]  100* 64* 90 133* 91    Radiological Exams on Admission: Ct Angio Head W/cm &/or Wo Cm  05/20/2014   CLINICAL DATA:  Head and neck pain.  EXAM: CT ANGIOGRAPHY HEAD AND NECK  TECHNIQUE: Multidetector CT imaging of the head and neck was performed using the standard protocol during bolus administration of intravenous contrast. Multiplanar CT image reconstructions and MIPs were obtained to evaluate the vascular anatomy. Carotid stenosis measurements (when applicable) are obtained utilizing NASCET criteria, using the distal internal carotid diameter as the denominator.  CONTRAST:  50 cc Omnipaque 350  COMPARISON:  None available for comparison at time of study interpretation.  FINDINGS: CTA HEAD FINDINGS  Anterior circulation: Normal appearance of the cervical internal carotid arteries, petrous, cavernous and supra clinoid internal carotid arteries. Widely patent anterior communicating artery. Normal appearance of the anterior and middle cerebral arteries.  Posterior circulation: LEFT vertebral artery is dominant with normal appearance of the vertebral arteries, vertebrobasilar junction and basilar artery, as well as main branch vessels. Normal appearance of the posterior cerebral arteries. RIGHT vertebral artery predominantly terminates in the  RIGHT posterior-inferior cerebellar artery.  No large vessel occlusion, hemodynamically significant stenosis, dissection, luminal irregularity, contrast extravasation or aneurysm within the anterior nor posterior circulation. Tiny bilateral posterior communicating arteries present.  Patient is nearly edentulous. Ventricles and sulci are normal for patient's age. No midline shift or mass effect. No abnormal parenchymal or leptomeningeal enhancement.  Review of the MIP images confirms the above findings.  CTA NECK FINDINGS  Normal appearance of the thoracic arch, normal branch pattern, trace calcific atherosclerosis. The origins of the innominate, left Common carotid artery and subclavian artery are widely patent.  Bilateral Common carotid arteries are widely patent, coursing in a straight line fashion. Normal appearance of the carotid bifurcations without hemodynamically significant stenosis by NASCET criteria. Normal appearance of the included internal carotid arteries.  Left vertebral artery is dominant. Normal appearance of the vertebral arteries, which appear widely patent. Mild extrinsic compression due to uncovertebral hypertrophy and facet arthropathy.  No hemodynamically significant stenosis by NASCET criteria. No dissection, no pseudoaneurysm. No abnormal luminal irregularity. No contrast extravasation.  Soft tissues are normal. Moderate to severe cervical spondylosis with severe C5-6 neural foraminal narrowing, partially characterized on submitted bone windows.  Review of the MIP images confirms the above findings.  IMPRESSION: CTA head: No acute vascular process nor hemodynamically significant stenosis.  CTA neck: No acute vascular process nor hemodynamically significant stenosis.   Electronically Signed   By: Elon Alas   On: 05/20/2014 00:35   Dg Chest 2 View  05/19/2014   CLINICAL DATA:  Four-day history of flank pain ; history of diabetes and hypertension and pulmonary embolism  EXAM: CHEST   2 VIEW  COMPARISON:  Chest x-ray of October 15, 2012  FINDINGS: The lungs are borderline hypoinflated. There is no focal infiltrate. The cardiac silhouette is top-normal in size. The pulmonary vascularity is not engorged. There is mild tortuosity of the descending thoracic aorta. There is no pleural effusion. There is calcification of the anterior longitudinal ligament of the thoracic spine.  IMPRESSION: There is no acute cardiopulmonary abnormality.   Electronically Signed   By: David  Martinique   On: 05/19/2014 16:38   Ct Angio Neck W/cm &/or Wo/cm  05/20/2014   CLINICAL DATA:  Head and neck pain.  EXAM: CT ANGIOGRAPHY HEAD AND NECK  TECHNIQUE: Multidetector CT imaging of the head and neck was performed using the standard protocol during bolus administration of  intravenous contrast. Multiplanar CT image reconstructions and MIPs were obtained to evaluate the vascular anatomy. Carotid stenosis measurements (when applicable) are obtained utilizing NASCET criteria, using the distal internal carotid diameter as the denominator.  CONTRAST:  50 cc Omnipaque 350  COMPARISON:  None available for comparison at time of study interpretation.  FINDINGS: CTA HEAD FINDINGS  Anterior circulation: Normal appearance of the cervical internal carotid arteries, petrous, cavernous and supra clinoid internal carotid arteries. Widely patent anterior communicating artery. Normal appearance of the anterior and middle cerebral arteries.  Posterior circulation: LEFT vertebral artery is dominant with normal appearance of the vertebral arteries, vertebrobasilar junction and basilar artery, as well as main branch vessels. Normal appearance of the posterior cerebral arteries. RIGHT vertebral artery predominantly terminates in the RIGHT posterior-inferior cerebellar artery.  No large vessel occlusion, hemodynamically significant stenosis, dissection, luminal irregularity, contrast extravasation or aneurysm within the anterior nor posterior  circulation. Tiny bilateral posterior communicating arteries present.  Patient is nearly edentulous. Ventricles and sulci are normal for patient's age. No midline shift or mass effect. No abnormal parenchymal or leptomeningeal enhancement.  Review of the MIP images confirms the above findings.  CTA NECK FINDINGS  Normal appearance of the thoracic arch, normal branch pattern, trace calcific atherosclerosis. The origins of the innominate, left Common carotid artery and subclavian artery are widely patent.  Bilateral Common carotid arteries are widely patent, coursing in a straight line fashion. Normal appearance of the carotid bifurcations without hemodynamically significant stenosis by NASCET criteria. Normal appearance of the included internal carotid arteries.  Left vertebral artery is dominant. Normal appearance of the vertebral arteries, which appear widely patent. Mild extrinsic compression due to uncovertebral hypertrophy and facet arthropathy.  No hemodynamically significant stenosis by NASCET criteria. No dissection, no pseudoaneurysm. No abnormal luminal irregularity. No contrast extravasation.  Soft tissues are normal. Moderate to severe cervical spondylosis with severe C5-6 neural foraminal narrowing, partially characterized on submitted bone windows.  Review of the MIP images confirms the above findings.  IMPRESSION: CTA head: No acute vascular process nor hemodynamically significant stenosis.  CTA neck: No acute vascular process nor hemodynamically significant stenosis.   Electronically Signed   By: Elon Alas   On: 05/20/2014 00:35   Ct Angio Chest Aortic Dissect W &/or W/o  05/19/2014   CLINICAL DATA:  Right flank pain and back pain.  EXAM: CT ANGIOGRAPHY CHEST AND ABDOMEN  TECHNIQUE: Multidetector CT imaging of the chest and abdomen was performed using the standard protocol during bolus administration of intravenous contrast. Multiplanar CT image reconstructions and MIPs were obtained to  evaluate the vascular anatomy.  CONTRAST:  160mL OMNIPAQUE IOHEXOL 350 MG/ML SOLN  COMPARISON:  Chest x-ray on 05/19/2014  FINDINGS: CTA CHEST FINDINGS  Heart:  The heart is mildly enlarged.  No pericardial effusion.  Vascular structures: Normal arch anatomy. No evidence for aneurysm or dissection.  Mediastinum/thyroid: No mediastinal, hilar, or axillary adenopathy. The visualized portion of the thyroid gland has a normal appearance.  Lungs: No focal consolidations or pleural effusions. There is dependent change at both lung bases. Lingular atelectasis.  Chest wall/osseous structures: Moderate degenerative changes. No suspicious lesions.  Review of the MIP images confirms the above findings.  CTA ABDOMEN FINDINGS  Upper abdomen: No focal abnormality identified within the liver, spleen, pancreas, or adrenal glands. Bilateral renal cysts are present. The gallbladder is present.  Bowel: The stomach and small bowel loops are normal in appearance. The appendix is well seen and has a normal appearance. Colonic loops  are normal in appearance.  Pelvis: The urinary bladder has a normal appearance. The prostate gland appears prominent. No free pelvic fluid.  Retroperitoneum: The abdominal aorta is not aneurysmal. No evidence for dissection. There is normal opacification of the renal arteries, celiac axis, superior mesenteric, and inferior mesenteric arteries. Normal iliac arteries.  Abdominal wall: Unremarkable.  Osseous structures: Degenerative changes are present.  Review of the MIP images confirms the above findings.  IMPRESSION: 1. No evidence for a dissection or aneurysm. 2. Mild dependent change in atelectasis at the lung bases. 3. Normal appendix. 4. Normal appearance of the aortic branches.   Electronically Signed   By: Shon Hale M.D.   On: 05/19/2014 23:34   Ct Cta Abd/pel W/cm &/or W/o Cm  05/19/2014   CLINICAL DATA:  Right flank pain and back pain.  EXAM: CT ANGIOGRAPHY CHEST AND ABDOMEN  TECHNIQUE:  Multidetector CT imaging of the chest and abdomen was performed using the standard protocol during bolus administration of intravenous contrast. Multiplanar CT image reconstructions and MIPs were obtained to evaluate the vascular anatomy.  CONTRAST:  143mL OMNIPAQUE IOHEXOL 350 MG/ML SOLN  COMPARISON:  Chest x-ray on 05/19/2014  FINDINGS: CTA CHEST FINDINGS  Heart:  The heart is mildly enlarged.  No pericardial effusion.  Vascular structures: Normal arch anatomy. No evidence for aneurysm or dissection.  Mediastinum/thyroid: No mediastinal, hilar, or axillary adenopathy. The visualized portion of the thyroid gland has a normal appearance.  Lungs: No focal consolidations or pleural effusions. There is dependent change at both lung bases. Lingular atelectasis.  Chest wall/osseous structures: Moderate degenerative changes. No suspicious lesions.  Review of the MIP images confirms the above findings.  CTA ABDOMEN FINDINGS  Upper abdomen: No focal abnormality identified within the liver, spleen, pancreas, or adrenal glands. Bilateral renal cysts are present. The gallbladder is present.  Bowel: The stomach and small bowel loops are normal in appearance. The appendix is well seen and has a normal appearance. Colonic loops are normal in appearance.  Pelvis: The urinary bladder has a normal appearance. The prostate gland appears prominent. No free pelvic fluid.  Retroperitoneum: The abdominal aorta is not aneurysmal. No evidence for dissection. There is normal opacification of the renal arteries, celiac axis, superior mesenteric, and inferior mesenteric arteries. Normal iliac arteries.  Abdominal wall: Unremarkable.  Osseous structures: Degenerative changes are present.  Review of the MIP images confirms the above findings.  IMPRESSION: 1. No evidence for a dissection or aneurysm. 2. Mild dependent change in atelectasis at the lung bases. 3. Normal appendix. 4. Normal appearance of the aortic branches.   Electronically Signed    By: Shon Hale M.D.   On: 05/19/2014 23:34      Assessment/Plan:   68 y.o. male with  Principal Problem:   1.   Intractable back pain-  Probably Musculoskeltal   Pain control with IV Dilaudid PRN   Add Robaxin for Muscle Spasms     Active Problems:   2.   Neck pain- Musculoskeletal, Spasms   See #1     3.   DM-   Continue 70/30 Insulin    Added SSI coverage PRN     4.   HYPERTENSION-   Monitor BPs   PRN IV Hydralazine for SBP > 160    5.   GERD-   Protonix     6.   Warfarin-induced coagulopathy-   Pharmacy adjustment of Coumdin Rx    Monitor PT/INRs     Code Status:  FULL CODE Family Communication:    No Family at Bedside Disposition Plan:       Inpatient  Time spent:  [place] [facility] Pager [phone]   If [place] Please Contact the Day Rounding Team MD for [facility]  If 7PM-7AM, Please Contact night-coverage  [email or web address] Password [facility] 05/20/2014, 7:47 AM

## 2014-05-20 NOTE — Evaluation (Signed)
Occupational Therapy Evaluation Patient Details Name: Matthew Zimmerman MRN: [id number] DOB: 07-13-1946 Today's Date: 05/20/2014    History of Present Illness Matthew Zimmerman is a 68 y.o. male with a history of IDDM, Hyperlipidemia, and recent Pulmonary Embolism on Coumadin rx who presents to the ED with complaints of severe pain in his right lower flank area x 1 week and increased pain in his neck that started in the AM upon awakening.     He denies having any trauma, and denies having any fever or chills.   He was seen by his PCP and sent to the ED to rule out a retroperitoneal hemorrhage since he is on Coumadin Rx.   Clinical Impression   Pt admitted with above. Pt independent with ADLs, PTA. Feel pt will benefit from acute OT to increase independence prior to d/c.     Follow Up Recommendations  No OT follow up;Supervision - Intermittent    Equipment Recommendations  3 in 1 bedside comode    Recommendations for Other Services       Precautions / Restrictions Precautions Precautions: Fall Restrictions Weight Bearing Restrictions: No      Mobility Bed Mobility Overal bed mobility: Needs Assistance Bed Mobility: Rolling;Sidelying to Sit;Sit to Sidelying Rolling: Min guard Sidelying to sit: Min guard     Sit to sidelying: Min assist General bed mobility comments: assistance with legs when getting back in bed  Transfers Overall transfer level: Needs assistance Equipment used: Rolling walker (2 wheeled) Transfers: Sit to/from Stand Sit to Stand: Min guard         General transfer comment: increased time, definite use of hands         ADL Overall ADL's : Needs assistance/impaired     Grooming: Wash/dry hands;Supervision/safety;Set up;Standing           Upper Body Dressing : Set up;Sitting   Lower Body Dressing: Minimal assistance;With adaptive equipment;Sit to/from stand   Toilet Transfer: Min guard;Ambulation;RW;Comfort height toilet   Toileting-  Clothing Manipulation and Hygiene: Min guard (standing)   Tub/ Shower Transfer: Min guard;Ambulation;3 in 1;Rolling walker   Functional mobility during ADLs: Min guard;Rolling walker General ADL Comments: Educated on safety tips for home (use of bag on walker, safe shoewear, sitting for LB ADLs). practiced simulated tub transfer.  Explained uses of reacher and pt practiced with sockaid. Educated on use of cup to prevent bending with oral care.     Vision                     Perception     Praxis      Pertinent Vitals/Pain Pain Assessment: 0-10 Pain Score: 8  Pain Location: side and neck Pain Intervention(s): Monitored during session;Repositioned     Hand Dominance Right   Extremity/Trunk Assessment Upper Extremity Assessment Upper Extremity Assessment: Overall WFL for tasks assessed   Lower Extremity Assessment Lower Extremity Assessment: Defer to PT evaluation     Communication Communication Communication: No difficulties   Cognition Arousal/Alertness: Awake/alert Behavior During Therapy: WFL for tasks assessed/performed Overall Cognitive Status: Within Functional Limits for tasks assessed                     General Comments       Exercises       Shoulder Instructions      Home Living Family/patient expects to be discharged to:: Private residence Living Arrangements: Spouse/significant other (has foster kids and grandchildren) Available Help at Discharge: Family;Available 24 hours/day Type  of Home: House Home Access: Stairs to enter [employer] of Steps: 3 Entrance Stairs-Rails: Right Home Layout: Multi-level Alternate Level Stairs-Number of Steps: 5 Alternate Level Stairs-Rails: Right Bathroom Shower/Tub: Tub/shower unit;Walk-in shower   Bathroom Toilet: Standard     Home Equipment: Adaptive equipment (device for socks but is not like sockaid) Adaptive Equipment: Long-handled shoe horn (long brush)        Prior  Functioning/Environment Level of Independence: Independent             OT Diagnosis: Acute pain   OT Problem List: Decreased strength;Decreased range of motion;Decreased knowledge of use of DME or AE;Decreased knowledge of precautions;Pain   OT Treatment/Interventions: Self-care/ADL training;DME and/or AE instruction;Therapeutic activities;Patient/family education;Balance training    OT Goals(Current goals can be found in the care plan section) Acute Rehab OT Goals Patient Stated Goal: not stated OT Goal Formulation: With patient Time For Goal Achievement: 05/27/14 Potential to Achieve Goals: Good ADL Goals Pt Will Perform Lower Body Dressing: with modified independence;with adaptive equipment;with caregiver independent in assisting;sit to/from stand Pt Will Transfer to Toilet: with modified independence;ambulating  OT Frequency: Min 2X/week   Barriers to D/C:            Co-evaluation              End of Session Equipment Utilized During Treatment: Gait belt;Rolling walker Nurse Communication: Mobility status  Activity Tolerance: Patient tolerated treatment well Patient left: in bed;with call bell/phone within reach;with bed alarm set;with family/visitor present   Time: 6440-3474 OT Time Calculation (min): 31 min Charges:  OT General Charges $OT Visit: 1 Procedure OT Evaluation $Initial OT Evaluation Tier I: 1 Procedure OT Treatments $Self Care/Home Management : 8-22 mins G-Codes: OT G-codes **NOT FOR INPATIENT CLASS** Functional Assessment Tool Used: clinical judgment Functional Limitation: Self care Self Care Current Status (Q5956): At least 20 percent but less than 40 percent impaired, limited or restricted Self Care Goal Status (L8756): 0 percent impaired, limited or restricted  Benito Mccreedy OTR/L [phone] 05/20/2014, 6:10 PM

## 2014-05-20 NOTE — Evaluation (Signed)
Physical Therapy Evaluation Patient Details Name: Matthew Zimmerman MRN: [id number] DOB: 07/04/1946 Today's Date: 05/20/2014   History of Present Illness  Matthew Zimmerman is a 68 y.o. male with a history of IDDM, Hyperlipidemia, and recent Pulmonary Embolism on Coumadin rx who presents to the ED with complaints of severe pain in his right lower flank area x 1 week and increased pain in his neck that started in the AM upon awakening.     He denies having any trauma, and denies having any fever or chills.   He was seen by his PCP and sent to the ED to rule out a retroperitoneal hemorrhage since he is on Coumadin Rx.  Clinical Impression  Pt with neck and flank pain limiting ability to transfer and tolerate ambulation. Pt with labored effort to get in/out of bed and unable to tolerate amb >75'. Pt was indep PTA and did not requires use of AD. Acute PT to con't to follow to progress mobility as able. Suspect once pain under control pt to be fine for d/c home with supervision of spouse.    Follow Up Recommendations No PT follow up;Supervision/Assistance - 24 hour    Equipment Recommendations  Rolling walker with 5" wheels    Recommendations for Other Services       Precautions / Restrictions Precautions Precautions: Fall Restrictions Weight Bearing Restrictions: No      Mobility  Bed Mobility Overal bed mobility: Needs Assistance Bed Mobility: Rolling;Sidelying to Sit;Sit to Sidelying Rolling: Min assist Sidelying to sit: Min assist     Sit to sidelying: Min assist General bed mobility comments: max directional v/c's, significant increase in time, labored effort due to pain  Transfers Overall transfer level: Needs assistance Equipment used: Rolling walker (2 wheeled) Transfers: Sit to/from Stand Sit to Stand: Min assist         General transfer comment: increased time, definite use of hands  Ambulation/Gait Ambulation/Gait assistance: Min assist;Min guard Ambulation  Distance (Feet): 75 Feet Assistive device: Rolling walker (2 wheeled);None Gait Pattern/deviations: Step-through pattern;Decreased stride length;Wide base of support;Trunk flexed Gait velocity: slow Gait velocity interpretation: Below normal speed for age/gender General Gait Details: pt amb without RW and with RW. When amb with RW pt with improved step length and cadance compared to without RW. Pt with short shuffled gait pattern during amb without RW. Pt reports "it's too hard to stand up straight when you're hurting this bad."  Stairs            Wheelchair Mobility    Modified Rankin (Stroke Patients Only)       Balance Overall balance assessment: Needs assistance Sitting-balance support: Feet supported;No upper extremity supported Sitting balance-Leahy Scale: Fair     Standing balance support: No upper extremity supported Standing balance-Leahy Scale: Fair Standing balance comment: pt able to stand statically but requires UE support during functional activity                             Pertinent Vitals/Pain Pain Assessment: 0-10 Pain Score: 5  Pain Location: R flank (8.5 in neck) Pain Intervention(s): RN gave pain meds during session    [place] expects to be discharged to:: Private residence Living Arrangements: Spouse/significant other (has foster kids and grandchildren) Available Help at Discharge: Family;Available 24 hours/day Type of Home: House Home Access: Stairs to enter Entrance Stairs-Rails: Right Entrance Stairs-Number of Steps: 3 Home Layout: Multi-level Home Equipment: None  Prior Function Level of Independence: Independent               Hand Dominance   Dominant Hand: Right    Extremity/Trunk Assessment   Upper Extremity Assessment: Generalized weakness (due to onset of further pain during MMT)           Lower Extremity Assessment: Generalized weakness      Cervical / Trunk Assessment:  Normal  Communication   Communication: No difficulties  Cognition Arousal/Alertness: Awake/alert Behavior During Therapy: WFL for tasks assessed/performed Overall Cognitive Status: Within Functional Limits for tasks assessed                      General Comments      Exercises        Assessment/Plan    PT Assessment Patient needs continued PT services  PT Diagnosis Difficulty walking;Acute pain   PT Problem List Decreased strength;Decreased activity tolerance;Decreased balance;Decreased mobility;Pain  PT Treatment Interventions DME instruction;Gait training;Stair training;Functional mobility training;Therapeutic activities;Therapeutic exercise;Balance training   PT Goals (Current goals can be found in the Care Plan section) Acute Rehab PT Goals Patient Stated Goal: stop hurting PT Goal Formulation: With patient Time For Goal Achievement: 05/27/14 Potential to Achieve Goals: Good    Frequency Min 3X/week   Barriers to discharge        Co-evaluation               End of Session Equipment Utilized During Treatment: Gait belt Activity Tolerance: Patient limited by fatigue;Patient limited by pain Patient left: in bed;with call bell/phone within reach;with bed alarm set Nurse Communication: Mobility status    Functional Assessment Tool Used: clinical judgement Functional Limitation: Mobility: Walking and moving around Mobility: Walking and Moving Around Current Status (T7001): At least 20 percent but less than 40 percent impaired, limited or restricted Mobility: Walking and Moving Around Goal Status [phone]): At least 1 percent but less than 20 percent impaired, limited or restricted    Time: 1435-1459 PT Time Calculation (min): 24 min   Charges:   PT Evaluation $Initial PT Evaluation Tier I: 1 Procedure PT Treatments $Gait Training: 8-22 mins   PT G Codes:   Functional Assessment Tool Used: clinical judgement Functional Limitation: Mobility: Walking  and moving around    [place], [facility] 05/20/2014, 3:26 PM  Kittie Plater, PT, DPT Pager #: [phone] Office #: [phone]

## 2014-05-20 NOTE — ED Notes (Signed)
Report attempted 

## 2014-05-20 NOTE — Progress Notes (Signed)
[facility] PROGRESS NOTE  Matthew Zimmerman YTK:[id number] DOB: [birth year] DOA: 05/19/2014 PCP: Kennon Portela, MD  Assessment/Plan: 1. Intractable back pain- Probably Musculoskeltal , neck pain:  Pain control with IV Dilaudid PRN  Robaxin for Muscle Spasms  CT abdomen angio negative for dissection.  Ct neck angio: review with radiologist patient with disc degenerative diseases, moderate spinal stenosis, C 4 -5, neuro foraminal stenosis.  Will add decadron. PT , ot evaluation.   3. DM-  Hold 70/30 Insulin , cbg yesterday at 44.  SSI coverage PRN  Will follow CBG trend, might need long acting if blood sugar increase now that he will be on decadron.   4. HYPERTENSION-  Monitor BPs  PRN IV Hydralazine for SBP > 160   5. GERD-  Protonix   6. Warfarin-induced coagulopathy-  Pharmacy adjustment of Coumdin Rx  Monitor PT/INRs   Code Status: Full Code.  Family Communication: care discussed with patient.  Disposition Plan: home in 24 hours.    Consultants:  none  Procedures:  none  Antibiotics:  none  HPI/Subjective: Patient still complaining of neck pain, flank pain when he moved.  He denies weakness, did had transient numbness left forearm this morning.    Objective: Filed Vitals:   05/20/14 1006  BP: 135/68  Pulse: 72  Temp: 99.1 F (37.3 C)  Resp: 18   No intake or output data in the 24 hours ending 05/20/14 1058 Filed Weights   05/19/14 1333  Weight: 90.719 kg (200 lb)    Exam:   General:  Alert in no distress.   Cardiovascular: S 1, S 2 RRR  Respiratory: CTA  Abdomen: BS present, soft distended.   Musculoskeletal: no edema.   Neuro exam, non focal.   Data Reviewed: Basic Metabolic Panel:  Recent Labs Lab 05/19/14 1744 05/19/14 1753 05/20/14 0600  NA 141 140 135*  K 4.3 4.2 4.0  CL 101 102 97  CO2 25  --  27  GLUCOSE 47* 48* 143*  BUN 16 16 14   CREATININE 1.30 1.30 1.12  CALCIUM 9.5  --  8.9   Liver Function  Tests: No results found for this basename: AST, ALT, ALKPHOS, BILITOT, PROT, ALBUMIN,  in the last 168 hours No results found for this basename: LIPASE, AMYLASE,  in the last 168 hours No results found for this basename: AMMONIA,  in the last 168 hours CBC:  Recent Labs Lab 05/19/14 1744 05/19/14 1753 05/20/14 0600  WBC 9.7  --  8.3  NEUTROABS 6.0  --   --   HGB 12.7* 14.6 11.8*  HCT 38.4* 43.0 36.2*  MCV 82.8  --  82.8  PLT 270  --  279   Cardiac Enzymes:  Recent Labs Lab 05/19/14 1829  TROPONINI <0.30   BNP (last 3 results) No results found for this basename: PROBNP,  in the last 8760 hours CBG:  Recent Labs Lab 05/19/14 2002 05/19/14 2112 05/19/14 2249 05/20/14 0034 05/20/14 0735  GLUCAP 100* 64* 90 133* 91    No results found for this or any previous visit (from the past 240 hour(s)).   Studies: Ct Angio Head W/cm &/or Wo Cm  05/20/2014   CLINICAL DATA:  Head and neck pain.  EXAM: CT ANGIOGRAPHY HEAD AND NECK  TECHNIQUE: Multidetector CT imaging of the head and neck was performed using the standard protocol during bolus administration of intravenous contrast. Multiplanar CT image reconstructions and MIPs were obtained to evaluate the vascular anatomy. Carotid stenosis measurements (when applicable)  are obtained utilizing NASCET criteria, using the distal internal carotid diameter as the denominator.  CONTRAST:  50 cc Omnipaque 350  COMPARISON:  None available for comparison at time of study interpretation.  FINDINGS: CTA HEAD FINDINGS  Anterior circulation: Normal appearance of the cervical internal carotid arteries, petrous, cavernous and supra clinoid internal carotid arteries. Widely patent anterior communicating artery. Normal appearance of the anterior and middle cerebral arteries.  Posterior circulation: LEFT vertebral artery is dominant with normal appearance of the vertebral arteries, vertebrobasilar junction and basilar artery, as well as main branch vessels.  Normal appearance of the posterior cerebral arteries. RIGHT vertebral artery predominantly terminates in the RIGHT posterior-inferior cerebellar artery.  No large vessel occlusion, hemodynamically significant stenosis, dissection, luminal irregularity, contrast extravasation or aneurysm within the anterior nor posterior circulation. Tiny bilateral posterior communicating arteries present.  Patient is nearly edentulous. Ventricles and sulci are normal for patient's age. No midline shift or mass effect. No abnormal parenchymal or leptomeningeal enhancement.  Review of the MIP images confirms the above findings.  CTA NECK FINDINGS  Normal appearance of the thoracic arch, normal branch pattern, trace calcific atherosclerosis. The origins of the innominate, left Common carotid artery and subclavian artery are widely patent.  Bilateral Common carotid arteries are widely patent, coursing in a straight line fashion. Normal appearance of the carotid bifurcations without hemodynamically significant stenosis by NASCET criteria. Normal appearance of the included internal carotid arteries.  Left vertebral artery is dominant. Normal appearance of the vertebral arteries, which appear widely patent. Mild extrinsic compression due to uncovertebral hypertrophy and facet arthropathy.  No hemodynamically significant stenosis by NASCET criteria. No dissection, no pseudoaneurysm. No abnormal luminal irregularity. No contrast extravasation.  Soft tissues are normal. Moderate to severe cervical spondylosis with severe C5-6 neural foraminal narrowing, partially characterized on submitted bone windows.  Review of the MIP images confirms the above findings.  IMPRESSION: CTA head: No acute vascular process nor hemodynamically significant stenosis.  CTA neck: No acute vascular process nor hemodynamically significant stenosis.   Electronically Signed   By: Elon Alas   On: 05/20/2014 00:35   Dg Chest 2 View  05/19/2014   CLINICAL DATA:   Four-day history of flank pain ; history of diabetes and hypertension and pulmonary embolism  EXAM: CHEST  2 VIEW  COMPARISON:  Chest x-ray of October 15, 2012  FINDINGS: The lungs are borderline hypoinflated. There is no focal infiltrate. The cardiac silhouette is top-normal in size. The pulmonary vascularity is not engorged. There is mild tortuosity of the descending thoracic aorta. There is no pleural effusion. There is calcification of the anterior longitudinal ligament of the thoracic spine.  IMPRESSION: There is no acute cardiopulmonary abnormality.   Electronically Signed   By: David  Martinique   On: 05/19/2014 16:38   Ct Angio Neck W/cm &/or Wo/cm  05/20/2014   CLINICAL DATA:  Head and neck pain.  EXAM: CT ANGIOGRAPHY HEAD AND NECK  TECHNIQUE: Multidetector CT imaging of the head and neck was performed using the standard protocol during bolus administration of intravenous contrast. Multiplanar CT image reconstructions and MIPs were obtained to evaluate the vascular anatomy. Carotid stenosis measurements (when applicable) are obtained utilizing NASCET criteria, using the distal internal carotid diameter as the denominator.  CONTRAST:  50 cc Omnipaque 350  COMPARISON:  None available for comparison at time of study interpretation.  FINDINGS: CTA HEAD FINDINGS  Anterior circulation: Normal appearance of the cervical internal carotid arteries, petrous, cavernous and supra clinoid internal carotid arteries.  Widely patent anterior communicating artery. Normal appearance of the anterior and middle cerebral arteries.  Posterior circulation: LEFT vertebral artery is dominant with normal appearance of the vertebral arteries, vertebrobasilar junction and basilar artery, as well as main branch vessels. Normal appearance of the posterior cerebral arteries. RIGHT vertebral artery predominantly terminates in the RIGHT posterior-inferior cerebellar artery.  No large vessel occlusion, hemodynamically significant stenosis,  dissection, luminal irregularity, contrast extravasation or aneurysm within the anterior nor posterior circulation. Tiny bilateral posterior communicating arteries present.  Patient is nearly edentulous. Ventricles and sulci are normal for patient's age. No midline shift or mass effect. No abnormal parenchymal or leptomeningeal enhancement.  Review of the MIP images confirms the above findings.  CTA NECK FINDINGS  Normal appearance of the thoracic arch, normal branch pattern, trace calcific atherosclerosis. The origins of the innominate, left Common carotid artery and subclavian artery are widely patent.  Bilateral Common carotid arteries are widely patent, coursing in a straight line fashion. Normal appearance of the carotid bifurcations without hemodynamically significant stenosis by NASCET criteria. Normal appearance of the included internal carotid arteries.  Left vertebral artery is dominant. Normal appearance of the vertebral arteries, which appear widely patent. Mild extrinsic compression due to uncovertebral hypertrophy and facet arthropathy.  No hemodynamically significant stenosis by NASCET criteria. No dissection, no pseudoaneurysm. No abnormal luminal irregularity. No contrast extravasation.  Soft tissues are normal. Moderate to severe cervical spondylosis with severe C5-6 neural foraminal narrowing, partially characterized on submitted bone windows.  Review of the MIP images confirms the above findings.  IMPRESSION: CTA head: No acute vascular process nor hemodynamically significant stenosis.  CTA neck: No acute vascular process nor hemodynamically significant stenosis.   Electronically Signed   By: Elon Alas   On: 05/20/2014 00:35   Ct Angio Chest Aortic Dissect W &/or W/o  05/19/2014   CLINICAL DATA:  Right flank pain and back pain.  EXAM: CT ANGIOGRAPHY CHEST AND ABDOMEN  TECHNIQUE: Multidetector CT imaging of the chest and abdomen was performed using the standard protocol during bolus  administration of intravenous contrast. Multiplanar CT image reconstructions and MIPs were obtained to evaluate the vascular anatomy.  CONTRAST:  169mL OMNIPAQUE IOHEXOL 350 MG/ML SOLN  COMPARISON:  Chest x-ray on 05/19/2014  FINDINGS: CTA CHEST FINDINGS  Heart:  The heart is mildly enlarged.  No pericardial effusion.  Vascular structures: Normal arch anatomy. No evidence for aneurysm or dissection.  Mediastinum/thyroid: No mediastinal, hilar, or axillary adenopathy. The visualized portion of the thyroid gland has a normal appearance.  Lungs: No focal consolidations or pleural effusions. There is dependent change at both lung bases. Lingular atelectasis.  Chest wall/osseous structures: Moderate degenerative changes. No suspicious lesions.  Review of the MIP images confirms the above findings.  CTA ABDOMEN FINDINGS  Upper abdomen: No focal abnormality identified within the liver, spleen, pancreas, or adrenal glands. Bilateral renal cysts are present. The gallbladder is present.  Bowel: The stomach and small bowel loops are normal in appearance. The appendix is well seen and has a normal appearance. Colonic loops are normal in appearance.  Pelvis: The urinary bladder has a normal appearance. The prostate gland appears prominent. No free pelvic fluid.  Retroperitoneum: The abdominal aorta is not aneurysmal. No evidence for dissection. There is normal opacification of the renal arteries, celiac axis, superior mesenteric, and inferior mesenteric arteries. Normal iliac arteries.  Abdominal wall: Unremarkable.  Osseous structures: Degenerative changes are present.  Review of the MIP images confirms the above findings.  IMPRESSION: 1.  No evidence for a dissection or aneurysm. 2. Mild dependent change in atelectasis at the lung bases. 3. Normal appendix. 4. Normal appearance of the aortic branches.   Electronically Signed   By: Shon Hale M.D.   On: 05/19/2014 23:34   Ct Cta Abd/pel W/cm &/or W/o Cm  05/19/2014    CLINICAL DATA:  Right flank pain and back pain.  EXAM: CT ANGIOGRAPHY CHEST AND ABDOMEN  TECHNIQUE: Multidetector CT imaging of the chest and abdomen was performed using the standard protocol during bolus administration of intravenous contrast. Multiplanar CT image reconstructions and MIPs were obtained to evaluate the vascular anatomy.  CONTRAST:  110mL OMNIPAQUE IOHEXOL 350 MG/ML SOLN  COMPARISON:  Chest x-ray on 05/19/2014  FINDINGS: CTA CHEST FINDINGS  Heart:  The heart is mildly enlarged.  No pericardial effusion.  Vascular structures: Normal arch anatomy. No evidence for aneurysm or dissection.  Mediastinum/thyroid: No mediastinal, hilar, or axillary adenopathy. The visualized portion of the thyroid gland has a normal appearance.  Lungs: No focal consolidations or pleural effusions. There is dependent change at both lung bases. Lingular atelectasis.  Chest wall/osseous structures: Moderate degenerative changes. No suspicious lesions.  Review of the MIP images confirms the above findings.  CTA ABDOMEN FINDINGS  Upper abdomen: No focal abnormality identified within the liver, spleen, pancreas, or adrenal glands. Bilateral renal cysts are present. The gallbladder is present.  Bowel: The stomach and small bowel loops are normal in appearance. The appendix is well seen and has a normal appearance. Colonic loops are normal in appearance.  Pelvis: The urinary bladder has a normal appearance. The prostate gland appears prominent. No free pelvic fluid.  Retroperitoneum: The abdominal aorta is not aneurysmal. No evidence for dissection. There is normal opacification of the renal arteries, celiac axis, superior mesenteric, and inferior mesenteric arteries. Normal iliac arteries.  Abdominal wall: Unremarkable.  Osseous structures: Degenerative changes are present.  Review of the MIP images confirms the above findings.  IMPRESSION: 1. No evidence for a dissection or aneurysm. 2. Mild dependent change in atelectasis at the  lung bases. 3. Normal appendix. 4. Normal appearance of the aortic branches.   Electronically Signed   By: Shon Hale M.D.   On: 05/19/2014 23:34    Scheduled Meds: . atorvastatin  20 mg Oral q1800  . insulin aspart  0-5 Units Subcutaneous QHS  . insulin aspart  0-9 Units Subcutaneous TID WC  . pantoprazole  40 mg Oral Daily  . warfarin  5 mg Oral Q M,W,F,Su-1800  . [START ON 05/21/2014] warfarin  7.5 mg Oral Q T,Th,Sat-1800  . Warfarin - Physician Dosing Inpatient   Does not apply q1800   Continuous Infusions: . sodium chloride 10 mL/hr (05/20/14 0518)    Principal Problem:   Intractable back pain Active Problems:   DM   HYPERTENSION   GERD   Neck pain   Warfarin-induced coagulopathy    Time spent: 25 minutes.     Niel Hummer A  [facility] Pager [phone]. If 7PM-7AM, please contact night-coverage at [email or web address], password [facility] 05/20/2014, 10:58 AM  LOS: 1 day

## 2014-05-21 ENCOUNTER — Observation Stay (HOSPITAL_COMMUNITY): Payer: Commercial Managed Care - HMO

## 2014-05-21 LAB — GLUCOSE, CAPILLARY
Glucose-Capillary: 385 mg/dL — ABNORMAL HIGH (ref 70–99)
Glucose-Capillary: 422 mg/dL — ABNORMAL HIGH (ref 70–99)
Glucose-Capillary: 478 mg/dL — ABNORMAL HIGH (ref 70–99)
Glucose-Capillary: 369 mg/dL — ABNORMAL HIGH (ref 70–99)

## 2014-05-21 LAB — PROTIME-INR
INR: 2.07 — ABNORMAL HIGH (ref 0.00–1.49)
Prothrombin Time: 23.3 seconds — ABNORMAL HIGH (ref 11.6–15.2)

## 2014-05-21 MED ORDER — DEXAMETHASONE 2 MG PO TABS: 2.0000 mg | ORAL_TABLET | Freq: Two times a day (BID) | ORAL | Status: DC

## 2014-05-21 MED ORDER — METHOCARBAMOL 750 MG PO TABS: 750.0000 mg | ORAL_TABLET | Freq: Three times a day (TID) | ORAL | Status: DC | PRN

## 2014-05-21 MED ORDER — DEXAMETHASONE 2 MG PO TABS: ORAL_TABLET | ORAL | Status: DC

## 2014-05-21 MED ORDER — INSULIN ASPART PROT & ASPART (70-30 MIX) 100 UNIT/ML ~~LOC~~ SUSP
25.0000 [IU] | Freq: Two times a day (BID) | SUBCUTANEOUS | Status: DC
Start: 2014-05-21 — End: 2014-05-22
  Administered 2014-05-21 – 2014-05-22 (×2): 25 [IU] via SUBCUTANEOUS
  Filled 2014-05-21: qty 10

## 2014-05-21 MED ORDER — GLIPIZIDE-METFORMIN HCL 2.5-500 MG PO TABS: 2.0000 | ORAL_TABLET | Freq: Two times a day (BID) | ORAL | Status: DC

## 2014-05-21 MED ORDER — METFORMIN HCL 500 MG PO TABS
1000.0000 mg | ORAL_TABLET | Freq: Two times a day (BID) | ORAL | Status: DC
  Administered 2014-05-22: 1000 mg via ORAL
  Filled 2014-05-21: qty 2

## 2014-05-21 MED ORDER — OXYCODONE HCL 5 MG PO TABS: 5.0000 mg | ORAL_TABLET | ORAL | Status: DC | PRN

## 2014-05-21 MED ORDER — GLIPIZIDE 5 MG PO TABS
5.0000 mg | ORAL_TABLET | Freq: Two times a day (BID) | ORAL | Status: DC
  Administered 2014-05-22: 5 mg via ORAL
  Filled 2014-05-21: qty 1

## 2014-05-21 NOTE — Progress Notes (Signed)
Results for PARRISH, BONN (MRN [id number]) as of 05/21/2014 10:01  Ref. Range 05/20/2014 07:35 05/20/2014 11:41 05/20/2014 16:50 05/20/2014 21:23 05/21/2014 06:45  Glucose-Capillary Latest Range: 70-99 mg/dL 91 188 (H) 206 (H) 365 (H) 385 (H)  CBGs continue to be greater than 180 mg/dl.  Patient takes 70/30 mixed insulin 25 units BID at home.  Recommend either starting 70/30 back in hospital if patient is eating or recommend starting Lantus 25 units daily that is equal to the dose of 70/30 insulin that is taken at home. Will continue to follow while in hospital.  Harvel Ricks RN BSN CDE

## 2014-05-21 NOTE — Progress Notes (Signed)
[facility] PROGRESS NOTE  Matthew Zimmerman SHF:[id number] DOB: Jul 04, 1946 DOA: 05/19/2014 PCP: Kennon Portela, MD  Assessment/Plan: 1. Intractable back pain- Probably Musculoskeltal , neck pain:  Pain control with IV Dilaudid PRN  Robaxin for Muscle Spasms  CT abdomen angio negative for dissection.  Ct neck angio: review with radiologist patient with disc degenerative diseases, moderate spinal stenosis, C 4 -5, neuro foraminal stenosis.  Improved with decadron/. PT , ot evaluation.   3. DM-  Blood sugar in the 300-400. Will resume 25 units 70/30 Insulin .   Will dc SSI to avoid double coverage with 70/30 and avoid hypoglycemia.  Will cancer discharge due to elevated blood sugar.   Face numbness; report episode of numbness face last Sunday. CT head negative.   4. HYPERTENSION-  Monitor BPs  PRN IV Hydralazine for SBP > 160   5. GERD-  Protonix   6. Warfarin-induced coagulopathy-  Pharmacy adjustment of Coumdin Rx  Monitor PT/INRs   Code Status: Full Code.  Family Communication: care discussed with patient.  Disposition Plan: home in 24 hours.    Consultants:  none  Procedures:  none  Antibiotics:  none  HPI/Subjective: Patient still complaining of neck pain, flank pain when he moved.  He denies weakness, did had transient numbness left forearm this morning.    Objective: Filed Vitals:   05/21/14 1803  BP: 153/74  Pulse: 74  Temp: 98.2 F (36.8 C)  Resp: 20    Intake/Output Summary (Last 24 hours) at 05/21/14 1953 Last data filed at 05/20/14 2100  Gross per 24 hour  Intake    120 ml  Output      0 ml  Net    120 ml   Filed Weights   05/19/14 1333  Weight: 90.719 kg (200 lb)    Exam:   General:  Alert in no distress.   Cardiovascular: S 1, S 2 RRR  Respiratory: CTA  Abdomen: BS present, soft distended.   Musculoskeletal: no edema.   Neuro exam, non focal.   Data Reviewed: Basic Metabolic Panel:  Recent Labs Lab  05/19/14 1744 05/19/14 1753 05/20/14 0600  NA 141 140 135*  K 4.3 4.2 4.0  CL 101 102 97  CO2 25  --  27  GLUCOSE 47* 48* 143*  BUN 16 16 14   CREATININE 1.30 1.30 1.12  CALCIUM 9.5  --  8.9   Liver Function Tests: No results found for this basename: AST, ALT, ALKPHOS, BILITOT, PROT, ALBUMIN,  in the last 168 hours No results found for this basename: LIPASE, AMYLASE,  in the last 168 hours No results found for this basename: AMMONIA,  in the last 168 hours CBC:  Recent Labs Lab 05/19/14 1744 05/19/14 1753 05/20/14 0600  WBC 9.7  --  8.3  NEUTROABS 6.0  --   --   HGB 12.7* 14.6 11.8*  HCT 38.4* 43.0 36.2*  MCV 82.8  --  82.8  PLT 270  --  279   Cardiac Enzymes:  Recent Labs Lab 05/19/14 1829  TROPONINI <0.30   BNP (last 3 results) No results found for this basename: PROBNP,  in the last 8760 hours CBG:  Recent Labs Lab 05/20/14 1650 05/20/14 2123 05/21/14 0645 05/21/14 1144 05/21/14 1642  GLUCAP 206* 365* 385* 422* 478*    No results found for this or any previous visit (from the past 240 hour(s)).   Studies: Ct Angio Head W/cm &/or Wo Cm  05/20/2014   CLINICAL DATA:  Head  and neck pain.  EXAM: CT ANGIOGRAPHY HEAD AND NECK  TECHNIQUE: Multidetector CT imaging of the head and neck was performed using the standard protocol during bolus administration of intravenous contrast. Multiplanar CT image reconstructions and MIPs were obtained to evaluate the vascular anatomy. Carotid stenosis measurements (when applicable) are obtained utilizing NASCET criteria, using the distal internal carotid diameter as the denominator.  CONTRAST:  50 cc Omnipaque 350  COMPARISON:  None available for comparison at time of study interpretation.  FINDINGS: CTA HEAD FINDINGS  Anterior circulation: Normal appearance of the cervical internal carotid arteries, petrous, cavernous and supra clinoid internal carotid arteries. Widely patent anterior communicating artery. Normal appearance of the  anterior and middle cerebral arteries.  Posterior circulation: LEFT vertebral artery is dominant with normal appearance of the vertebral arteries, vertebrobasilar junction and basilar artery, as well as main branch vessels. Normal appearance of the posterior cerebral arteries. RIGHT vertebral artery predominantly terminates in the RIGHT posterior-inferior cerebellar artery.  No large vessel occlusion, hemodynamically significant stenosis, dissection, luminal irregularity, contrast extravasation or aneurysm within the anterior nor posterior circulation. Tiny bilateral posterior communicating arteries present.  Patient is nearly edentulous. Ventricles and sulci are normal for patient's age. No midline shift or mass effect. No abnormal parenchymal or leptomeningeal enhancement.  Review of the MIP images confirms the above findings.  CTA NECK FINDINGS  Normal appearance of the thoracic arch, normal branch pattern, trace calcific atherosclerosis. The origins of the innominate, left Common carotid artery and subclavian artery are widely patent.  Bilateral Common carotid arteries are widely patent, coursing in a straight line fashion. Normal appearance of the carotid bifurcations without hemodynamically significant stenosis by NASCET criteria. Normal appearance of the included internal carotid arteries.  Left vertebral artery is dominant. Normal appearance of the vertebral arteries, which appear widely patent. Mild extrinsic compression due to uncovertebral hypertrophy and facet arthropathy.  No hemodynamically significant stenosis by NASCET criteria. No dissection, no pseudoaneurysm. No abnormal luminal irregularity. No contrast extravasation.  Soft tissues are normal. Moderate to severe cervical spondylosis with severe C5-6 neural foraminal narrowing, partially characterized on submitted bone windows.  Review of the MIP images confirms the above findings.  IMPRESSION: CTA head: No acute vascular process nor  hemodynamically significant stenosis.  CTA neck: No acute vascular process nor hemodynamically significant stenosis.   Electronically Signed   By: Elon Alas   On: 05/20/2014 00:35   Ct Head Wo Contrast  05/21/2014   CLINICAL DATA:  Transient facial numbness  EXAM: CT HEAD WITHOUT CONTRAST  TECHNIQUE: Contiguous axial images were obtained from the base of the skull through the vertex without intravenous contrast.  COMPARISON:  None.  FINDINGS: There is no evidence of mass effect, midline shift, or extra-axial fluid collections. There is no evidence of a space-occupying lesion or intracranial hemorrhage. There is no evidence of a cortical-based area of acute infarction. There is mild periventricular white matter low attenuation likely secondary to microangiopathy.  The ventricles and sulci are appropriate for the patient's age. The basal cisterns are patent.  Visualized portions of the orbits are unremarkable. The visualized portions of the paranasal sinuses and mastoid air cells are unremarkable.  The osseous structures are unremarkable.   Electronically Signed   By: Kathreen Devoid   On: 05/21/2014 14:18   Ct Angio Neck W/cm &/or Wo/cm  05/20/2014   CLINICAL DATA:  Head and neck pain.  EXAM: CT ANGIOGRAPHY HEAD AND NECK  TECHNIQUE: Multidetector CT imaging of the head and neck was  performed using the standard protocol during bolus administration of intravenous contrast. Multiplanar CT image reconstructions and MIPs were obtained to evaluate the vascular anatomy. Carotid stenosis measurements (when applicable) are obtained utilizing NASCET criteria, using the distal internal carotid diameter as the denominator.  CONTRAST:  50 cc Omnipaque 350  COMPARISON:  None available for comparison at time of study interpretation.  FINDINGS: CTA HEAD FINDINGS  Anterior circulation: Normal appearance of the cervical internal carotid arteries, petrous, cavernous and supra clinoid internal carotid arteries. Widely patent  anterior communicating artery. Normal appearance of the anterior and middle cerebral arteries.  Posterior circulation: LEFT vertebral artery is dominant with normal appearance of the vertebral arteries, vertebrobasilar junction and basilar artery, as well as main branch vessels. Normal appearance of the posterior cerebral arteries. RIGHT vertebral artery predominantly terminates in the RIGHT posterior-inferior cerebellar artery.  No large vessel occlusion, hemodynamically significant stenosis, dissection, luminal irregularity, contrast extravasation or aneurysm within the anterior nor posterior circulation. Tiny bilateral posterior communicating arteries present.  Patient is nearly edentulous. Ventricles and sulci are normal for patient's age. No midline shift or mass effect. No abnormal parenchymal or leptomeningeal enhancement.  Review of the MIP images confirms the above findings.  CTA NECK FINDINGS  Normal appearance of the thoracic arch, normal branch pattern, trace calcific atherosclerosis. The origins of the innominate, left Common carotid artery and subclavian artery are widely patent.  Bilateral Common carotid arteries are widely patent, coursing in a straight line fashion. Normal appearance of the carotid bifurcations without hemodynamically significant stenosis by NASCET criteria. Normal appearance of the included internal carotid arteries.  Left vertebral artery is dominant. Normal appearance of the vertebral arteries, which appear widely patent. Mild extrinsic compression due to uncovertebral hypertrophy and facet arthropathy.  No hemodynamically significant stenosis by NASCET criteria. No dissection, no pseudoaneurysm. No abnormal luminal irregularity. No contrast extravasation.  Soft tissues are normal. Moderate to severe cervical spondylosis with severe C5-6 neural foraminal narrowing, partially characterized on submitted bone windows.  Review of the MIP images confirms the above findings.   IMPRESSION: CTA head: No acute vascular process nor hemodynamically significant stenosis.  CTA neck: No acute vascular process nor hemodynamically significant stenosis.   Electronically Signed   By: Elon Alas   On: 05/20/2014 00:35   Ct Angio Chest Aortic Dissect W &/or W/o  05/19/2014   CLINICAL DATA:  Right flank pain and back pain.  EXAM: CT ANGIOGRAPHY CHEST AND ABDOMEN  TECHNIQUE: Multidetector CT imaging of the chest and abdomen was performed using the standard protocol during bolus administration of intravenous contrast. Multiplanar CT image reconstructions and MIPs were obtained to evaluate the vascular anatomy.  CONTRAST:  153mL OMNIPAQUE IOHEXOL 350 MG/ML SOLN  COMPARISON:  Chest x-ray on 05/19/2014  FINDINGS: CTA CHEST FINDINGS  Heart:  The heart is mildly enlarged.  No pericardial effusion.  Vascular structures: Normal arch anatomy. No evidence for aneurysm or dissection.  Mediastinum/thyroid: No mediastinal, hilar, or axillary adenopathy. The visualized portion of the thyroid gland has a normal appearance.  Lungs: No focal consolidations or pleural effusions. There is dependent change at both lung bases. Lingular atelectasis.  Chest wall/osseous structures: Moderate degenerative changes. No suspicious lesions.  Review of the MIP images confirms the above findings.  CTA ABDOMEN FINDINGS  Upper abdomen: No focal abnormality identified within the liver, spleen, pancreas, or adrenal glands. Bilateral renal cysts are present. The gallbladder is present.  Bowel: The stomach and small bowel loops are normal in appearance. The appendix is  well seen and has a normal appearance. Colonic loops are normal in appearance.  Pelvis: The urinary bladder has a normal appearance. The prostate gland appears prominent. No free pelvic fluid.  Retroperitoneum: The abdominal aorta is not aneurysmal. No evidence for dissection. There is normal opacification of the renal arteries, celiac axis, superior mesenteric, and  inferior mesenteric arteries. Normal iliac arteries.  Abdominal wall: Unremarkable.  Osseous structures: Degenerative changes are present.  Review of the MIP images confirms the above findings.  IMPRESSION: 1. No evidence for a dissection or aneurysm. 2. Mild dependent change in atelectasis at the lung bases. 3. Normal appendix. 4. Normal appearance of the aortic branches.   Electronically Signed   By: Shon Hale M.D.   On: 05/19/2014 23:34   Ct Cta Abd/pel W/cm &/or W/o Cm  05/19/2014   CLINICAL DATA:  Right flank pain and back pain.  EXAM: CT ANGIOGRAPHY CHEST AND ABDOMEN  TECHNIQUE: Multidetector CT imaging of the chest and abdomen was performed using the standard protocol during bolus administration of intravenous contrast. Multiplanar CT image reconstructions and MIPs were obtained to evaluate the vascular anatomy.  CONTRAST:  166mL OMNIPAQUE IOHEXOL 350 MG/ML SOLN  COMPARISON:  Chest x-ray on 05/19/2014  FINDINGS: CTA CHEST FINDINGS  Heart:  The heart is mildly enlarged.  No pericardial effusion.  Vascular structures: Normal arch anatomy. No evidence for aneurysm or dissection.  Mediastinum/thyroid: No mediastinal, hilar, or axillary adenopathy. The visualized portion of the thyroid gland has a normal appearance.  Lungs: No focal consolidations or pleural effusions. There is dependent change at both lung bases. Lingular atelectasis.  Chest wall/osseous structures: Moderate degenerative changes. No suspicious lesions.  Review of the MIP images confirms the above findings.  CTA ABDOMEN FINDINGS  Upper abdomen: No focal abnormality identified within the liver, spleen, pancreas, or adrenal glands. Bilateral renal cysts are present. The gallbladder is present.  Bowel: The stomach and small bowel loops are normal in appearance. The appendix is well seen and has a normal appearance. Colonic loops are normal in appearance.  Pelvis: The urinary bladder has a normal appearance. The prostate gland appears prominent.  No free pelvic fluid.  Retroperitoneum: The abdominal aorta is not aneurysmal. No evidence for dissection. There is normal opacification of the renal arteries, celiac axis, superior mesenteric, and inferior mesenteric arteries. Normal iliac arteries.  Abdominal wall: Unremarkable.  Osseous structures: Degenerative changes are present.  Review of the MIP images confirms the above findings.  IMPRESSION: 1. No evidence for a dissection or aneurysm. 2. Mild dependent change in atelectasis at the lung bases. 3. Normal appendix. 4. Normal appearance of the aortic branches.   Electronically Signed   By: Shon Hale M.D.   On: 05/19/2014 23:34    Scheduled Meds: . atorvastatin  20 mg Oral q1800  . glipiZIDE  5 mg Oral BID WC   And  . metFORMIN  1,000 mg Oral BID WC  . insulin aspart  0-9 Units Subcutaneous TID WC  . insulin aspart protamine- aspart  25 Units Subcutaneous BID WC  . pantoprazole  40 mg Oral Daily  . warfarin  5 mg Oral Q M,W,F,Su-1800  . warfarin  7.5 mg Oral Q T,Th,Sat-1800  . Warfarin - Physician Dosing Inpatient   Does not apply q1800   Continuous Infusions: . sodium chloride 10 mL/hr (05/20/14 0518)    Principal Problem:   Intractable back pain Active Problems:   DM   HYPERTENSION   GERD   Neck pain  Warfarin-induced coagulopathy    Time spent: 25 minutes.     Niel Hummer A  [facility] Pager [phone]. If 7PM-7AM, please contact night-coverage at [email or web address], password [facility] 05/21/2014, 7:53 PM  LOS: 2 days

## 2014-05-21 NOTE — Progress Notes (Signed)
Pt blood sugar 422.  Verbal orders to give 13 units.  Will continue to monitor. Cori Razor, RN 05/21/2014 11:50 AM

## 2014-05-21 NOTE — Progress Notes (Signed)
Assessed for G-codes, added below.    2014-06-09 0908  OT G-codes **NOT FOR INPATIENT CLASS**  Functional Assessment Tool Used clinical judgment  Functional Limitation Self care  Self Care Current Status (H1505) [facility]  Self Care Goal Status (W9794) [facility]  Self Care Discharge Status [phone]) [facility]    Cyndie Chime, OTR/L Occupational Therapist

## 2014-05-21 NOTE — Progress Notes (Signed)
Physical Therapy Treatment Patient Details Name: Matthew Zimmerman MRN: [id number] DOB: 1946/05/16 Today's Date: 05/21/2014    History of Present Illness Matthew Zimmerman is a 68 y.o. male with a history of IDDM, Hyperlipidemia, and recent Pulmonary Embolism on Coumadin rx who presents to the ED with complaints of severe pain in his right lower flank area x 1 week and increased pain in his neck that started in the AM upon awakening.     He denies having any trauma, and denies having any fever or chills.   He was seen by his PCP and sent to the ED to rule out a retroperitoneal hemorrhage since he is on Coumadin Rx.    PT Comments    Pt at or approaching his baseline function, but still remains stiff.  No further PT needs at this time.  Will sign off.  Follow Up Recommendations  No PT follow up;Supervision/Assistance - 24 hour     Equipment Recommendations  None recommended by PT    Recommendations for Other Services       Precautions / Restrictions Precautions Precautions: Fall Restrictions Weight Bearing Restrictions: No    Mobility  Bed Mobility Overal bed mobility: Modified Independent Bed Mobility: Supine to Sit     Supine to sit: Modified independent (Device/Increase time)     General bed mobility comments: able to bring B les in/out of bed without assistance  Transfers Overall transfer level: Needs assistance Equipment used: None Transfers: Sit to/from Stand Sit to Stand: Modified independent (Device/Increase time)         General transfer comment: slow, but safe  Ambulation/Gait Ambulation/Gait assistance: Independent Ambulation Distance (Feet): 400 Feet Assistive device: None Gait Pattern/deviations: Step-through pattern Gait velocity: able to speed up appreciably   General Gait Details: slower, but fluid, safe gait   Stairs Stairs: Yes Stairs assistance: Supervision Stair Management: Alternating pattern;Forwards Number of Stairs: 7 General  stair comments: safe with rail  Wheelchair Mobility    Modified Rankin (Stroke Patients Only)       Balance Overall balance assessment: No apparent balance deficits (not formally assessed)   Sitting balance-Leahy Scale: Normal       Standing balance-Leahy Scale: Good               High level balance activites: Direction changes;Sudden stops;Head turns High Level Balance Comments: higher level balance challenge did not produce any overt deviation or LOB    Cognition                            Exercises      General Comments        Pertinent Vitals/Pain Pain Assessment: No/denies pain    Home Living                      Prior Function            PT Goals (current goals can now be found in the care plan section) Acute Rehab PT Goals PT Goal Formulation: With patient Time For Goal Achievement: 05/27/14 Potential to Achieve Goals: Good Progress towards PT goals: Progressing toward goals;Goals met/education completed, patient discharged from PT    Frequency  Min 3X/week    PT Plan Current plan remains appropriate    Co-evaluation             End of Session   Activity Tolerance: Patient tolerated treatment well Patient left: in bed;with call bell/phone within reach;with  bed alarm set     Time: 1036-1100 PT Time Calculation (min): 24 min  Charges:  $Gait Training: 8-22 mins $Therapeutic Activity: 8-22 mins                    G Codes:  Functional Assessment Tool Used: clinical judgement Functional Limitation: Mobility: Walking and moving around Mobility: Walking and Moving Around Current Status [phone]): At least 20 percent but less than 40 percent impaired, limited or restricted Mobility: Walking and Moving Around Goal Status [phone]): At least 1 percent but less than 20 percent impaired, limited or restricted Mobility: Walking and Moving Around Discharge Status [phone]): 0 percent impaired, limited or restricted    Matthew Zimmerman, Matthew Zimmerman 05/21/2014, 11:26 AM 05/21/2014  Matthew Zimmerman, PT [phone]  (pager)

## 2014-05-21 NOTE — Progress Notes (Signed)
Pt's blood sugar 478.  MD notified, awaiting further instructions. Cori Razor, RN 05/21/2014 5:03 PM

## 2014-05-21 NOTE — Progress Notes (Signed)
Telephone orders received to give 2 units sliding scale insulin and 25 units 70/30 insulin.  Will continue to monitor. Cori Razor, RN 05/21/2014

## 2014-05-21 NOTE — Discharge Summary (Signed)
Physician Discharge Summary  Matthew Zimmerman MWU:[id number] DOB: [birth year] DOA: 05/19/2014  PCP: Kennon Portela, MD  Admit date: 05/19/2014 Discharge date: 05/21/2014  Time spent: 35 minutes  Recommendations for Outpatient Follow-up:  1. Need referral to neurosurgery for follow up spinal stenosis.  2. Need further adjustment of insulin.   Discharge Diagnoses:    Cervical spinal stenosis, neck pain.    DM   HYPERTENSION   GERD   Neck pain   Warfarin-induced coagulopathy   Discharge Condition: Stable.   Diet recommendation: Carb modified.   Filed Weights   05/19/14 1333  Weight: 90.719 kg (200 lb)    History of present illness:  Matthew Zimmerman is a 68 y.o. male with a history of IDDM, Hyperlipidemia, and recent Pulmonary Embolism on Coumadin rx who presents to the ED with complaints of severe pain in his right lower flank area x 1 week and increased pain in his neck that started in the AM upon awakening. He denies having any trauma, and denies having any fever or chills. He was seen by his PCP and sent to the ED to rule out a retroperitoneal hemorrhage since he is on Coumadin Rx. He was evalauted in the ED and had a CTA of the head, C-spine and ABD /Pelvis which were performed and were negative for a retroperitoneal hemorrhage and also negative for pathology or etiology of his pain. He was unrelieved by pain medication he received in the ED and was referred for admission.    Hospital Course:  1. Intractable back pain- Probably Musculoskeltal , neck pain:  Pain control with IV Dilaudid PRN  Robaxin for Muscle Spasms  CT abdomen angio negative for dissection.  Ct neck angio: review with radiologist patient with disc degenerative diseases, moderate spinal stenosis, C 4 -5, neuro foraminal stenosis.  Improved with decadron/.  PT , ot evaluation.   3. DM-  resume 25 units 70/30 Insulin .  Will dc SSI to avoid double coverage with 70/30 and avoid hypoglycemia.  Resume  metformin and glipizide.   Face numbness; report episode of numbness face last Sunday. CT head negative.   4. HYPERTENSION-  Monitor BPs  PRN IV Hydralazine for SBP > 160   5. GERD-  Protonix   6. Warfarin-induced coagulopathy-  Pharmacy adjustment of Coumdin Rx  Monitor PT/INRs   Procedures:  none  Consultations:  none  Discharge Exam: Filed Vitals:   05/21/14 0945  BP: 137/75  Pulse: 83  Temp: 97.8 F (36.6 C)  Resp: 18    General: Alert in no distress.  Cardiovascular: S 1, S 2 RRR Respiratory: CTA  Discharge Instructions You were cared for by a hospitalist during your hospital stay. If you have any questions about your discharge medications or the care you received while you were in the hospital after you are discharged, you can call the unit and asked to speak with the hospitalist on call if the hospitalist that took care of you is not available. Once you are discharged, your primary care physician will handle any further medical issues. Please note that NO REFILLS for any discharge medications will be authorized once you are discharged, as it is imperative that you return to your primary care physician (or establish a relationship with a primary care physician if you do not have one) for your aftercare needs so that they can reassess your need for medications and monitor your lab values.  Discharge Instructions   Diet Carb Modified    Complete by:  As directed      Increase activity slowly    Complete by:  As directed           Current Discharge Medication List    START taking these medications   Details  dexamethasone (DECADRON) 2 MG tablet Take 1 tablet twice a day for 2 days then stopped. Qty: 4 tablet, Refills: 0    methocarbamol (ROBAXIN) 750 MG tablet Take 1 tablet (750 mg total) by mouth every 8 (eight) hours as needed for muscle spasms. Qty: 10 tablet, Refills: 0    oxyCODONE (OXY IR/ROXICODONE) 5 MG immediate release tablet Take 1 tablet (5 mg  total) by mouth every 4 (four) hours as needed for moderate pain. Qty: 30 tablet, Refills: 0      CONTINUE these medications which have NOT CHANGED   Details  esomeprazole (NEXIUM) 40 MG capsule Take 40 mg by mouth at bedtime.    glipiZIDE-metformin (METAGLIP) 2.5-500 MG per tablet Take 2 tablets by mouth 2 (two) times daily.    insulin NPH-insulin regular (NOVOLIN 70/30) (70-30) 100 UNIT/ML injection Inject 30 Units into the skin 2 (two) times daily with a meal.     rosuvastatin (CRESTOR) 10 MG tablet Take 10 mg by mouth at bedtime.     simethicone (MYLICON) 998 MG chewable tablet Chew 125 mg by mouth every 6 (six) hours as needed for flatulence.    warfarin (COUMADIN) 2.5 MG tablet Take 5-7.5 mg by mouth daily at 6 PM. Takes 7.5mg  on tues, thurs and sat  Takes 5mg  all other days      STOP taking these medications     HYDROcodone-acetaminophen (NORCO/VICODIN) 5-325 MG per tablet        Allergies  Allergen Reactions  . Cortisone Other (See Comments)    CBG thrown out of control   Follow-up Information   Follow up with GUEST, Veneda Melter, MD.   Specialty:  Internal Medicine   Contact information:   [place] [address] [phone]        The results of significant diagnostics from this hospitalization (including imaging, microbiology, ancillary and laboratory) are listed below for reference.    Significant Diagnostic Studies: Ct Angio Head W/cm &/or Wo Cm  05/20/2014   CLINICAL DATA:  Head and neck pain.  EXAM: CT ANGIOGRAPHY HEAD AND NECK  TECHNIQUE: Multidetector CT imaging of the head and neck was performed using the standard protocol during bolus administration of intravenous contrast. Multiplanar CT image reconstructions and MIPs were obtained to evaluate the vascular anatomy. Carotid stenosis measurements (when applicable) are obtained utilizing NASCET criteria, using the distal internal carotid diameter as the denominator.  CONTRAST:  50 cc  Omnipaque 350  COMPARISON:  None available for comparison at time of study interpretation.  FINDINGS: CTA HEAD FINDINGS  Anterior circulation: Normal appearance of the cervical internal carotid arteries, petrous, cavernous and supra clinoid internal carotid arteries. Widely patent anterior communicating artery. Normal appearance of the anterior and middle cerebral arteries.  Posterior circulation: LEFT vertebral artery is dominant with normal appearance of the vertebral arteries, vertebrobasilar junction and basilar artery, as well as main branch vessels. Normal appearance of the posterior cerebral arteries. RIGHT vertebral artery predominantly terminates in the RIGHT posterior-inferior cerebellar artery.  No large vessel occlusion, hemodynamically significant stenosis, dissection, luminal irregularity, contrast extravasation or aneurysm within the anterior nor posterior circulation. Tiny bilateral posterior communicating arteries present.  Patient is nearly edentulous. Ventricles and sulci are normal for patient's age. No midline shift  or mass effect. No abnormal parenchymal or leptomeningeal enhancement.  Review of the MIP images confirms the above findings.  CTA NECK FINDINGS  Normal appearance of the thoracic arch, normal branch pattern, trace calcific atherosclerosis. The origins of the innominate, left Common carotid artery and subclavian artery are widely patent.  Bilateral Common carotid arteries are widely patent, coursing in a straight line fashion. Normal appearance of the carotid bifurcations without hemodynamically significant stenosis by NASCET criteria. Normal appearance of the included internal carotid arteries.  Left vertebral artery is dominant. Normal appearance of the vertebral arteries, which appear widely patent. Mild extrinsic compression due to uncovertebral hypertrophy and facet arthropathy.  No hemodynamically significant stenosis by NASCET criteria. No dissection, no pseudoaneurysm. No  abnormal luminal irregularity. No contrast extravasation.  Soft tissues are normal. Moderate to severe cervical spondylosis with severe C5-6 neural foraminal narrowing, partially characterized on submitted bone windows.  Review of the MIP images confirms the above findings.  IMPRESSION: CTA head: No acute vascular process nor hemodynamically significant stenosis.  CTA neck: No acute vascular process nor hemodynamically significant stenosis.   Electronically Signed   By: Elon Alas   On: 05/20/2014 00:35   Dg Chest 2 View  05/19/2014   CLINICAL DATA:  Four-day history of flank pain ; history of diabetes and hypertension and pulmonary embolism  EXAM: CHEST  2 VIEW  COMPARISON:  Chest x-ray of October 15, 2012  FINDINGS: The lungs are borderline hypoinflated. There is no focal infiltrate. The cardiac silhouette is top-normal in size. The pulmonary vascularity is not engorged. There is mild tortuosity of the descending thoracic aorta. There is no pleural effusion. There is calcification of the anterior longitudinal ligament of the thoracic spine.  IMPRESSION: There is no acute cardiopulmonary abnormality.   Electronically Signed   By: David  Martinique   On: 05/19/2014 16:38   Ct Angio Neck W/cm &/or Wo/cm  05/20/2014   CLINICAL DATA:  Head and neck pain.  EXAM: CT ANGIOGRAPHY HEAD AND NECK  TECHNIQUE: Multidetector CT imaging of the head and neck was performed using the standard protocol during bolus administration of intravenous contrast. Multiplanar CT image reconstructions and MIPs were obtained to evaluate the vascular anatomy. Carotid stenosis measurements (when applicable) are obtained utilizing NASCET criteria, using the distal internal carotid diameter as the denominator.  CONTRAST:  50 cc Omnipaque 350  COMPARISON:  None available for comparison at time of study interpretation.  FINDINGS: CTA HEAD FINDINGS  Anterior circulation: Normal appearance of the cervical internal carotid arteries, petrous,  cavernous and supra clinoid internal carotid arteries. Widely patent anterior communicating artery. Normal appearance of the anterior and middle cerebral arteries.  Posterior circulation: LEFT vertebral artery is dominant with normal appearance of the vertebral arteries, vertebrobasilar junction and basilar artery, as well as main branch vessels. Normal appearance of the posterior cerebral arteries. RIGHT vertebral artery predominantly terminates in the RIGHT posterior-inferior cerebellar artery.  No large vessel occlusion, hemodynamically significant stenosis, dissection, luminal irregularity, contrast extravasation or aneurysm within the anterior nor posterior circulation. Tiny bilateral posterior communicating arteries present.  Patient is nearly edentulous. Ventricles and sulci are normal for patient's age. No midline shift or mass effect. No abnormal parenchymal or leptomeningeal enhancement.  Review of the MIP images confirms the above findings.  CTA NECK FINDINGS  Normal appearance of the thoracic arch, normal branch pattern, trace calcific atherosclerosis. The origins of the innominate, left Common carotid artery and subclavian artery are widely patent.  Bilateral Common carotid arteries are  widely patent, coursing in a straight line fashion. Normal appearance of the carotid bifurcations without hemodynamically significant stenosis by NASCET criteria. Normal appearance of the included internal carotid arteries.  Left vertebral artery is dominant. Normal appearance of the vertebral arteries, which appear widely patent. Mild extrinsic compression due to uncovertebral hypertrophy and facet arthropathy.  No hemodynamically significant stenosis by NASCET criteria. No dissection, no pseudoaneurysm. No abnormal luminal irregularity. No contrast extravasation.  Soft tissues are normal. Moderate to severe cervical spondylosis with severe C5-6 neural foraminal narrowing, partially characterized on submitted bone  windows.  Review of the MIP images confirms the above findings.  IMPRESSION: CTA head: No acute vascular process nor hemodynamically significant stenosis.  CTA neck: No acute vascular process nor hemodynamically significant stenosis.   Electronically Signed   By: Elon Alas   On: 05/20/2014 00:35   Ct Angio Chest Aortic Dissect W &/or W/o  05/19/2014   CLINICAL DATA:  Right flank pain and back pain.  EXAM: CT ANGIOGRAPHY CHEST AND ABDOMEN  TECHNIQUE: Multidetector CT imaging of the chest and abdomen was performed using the standard protocol during bolus administration of intravenous contrast. Multiplanar CT image reconstructions and MIPs were obtained to evaluate the vascular anatomy.  CONTRAST:  151mL OMNIPAQUE IOHEXOL 350 MG/ML SOLN  COMPARISON:  Chest x-ray on 05/19/2014  FINDINGS: CTA CHEST FINDINGS  Heart:  The heart is mildly enlarged.  No pericardial effusion.  Vascular structures: Normal arch anatomy. No evidence for aneurysm or dissection.  Mediastinum/thyroid: No mediastinal, hilar, or axillary adenopathy. The visualized portion of the thyroid gland has a normal appearance.  Lungs: No focal consolidations or pleural effusions. There is dependent change at both lung bases. Lingular atelectasis.  Chest wall/osseous structures: Moderate degenerative changes. No suspicious lesions.  Review of the MIP images confirms the above findings.  CTA ABDOMEN FINDINGS  Upper abdomen: No focal abnormality identified within the liver, spleen, pancreas, or adrenal glands. Bilateral renal cysts are present. The gallbladder is present.  Bowel: The stomach and small bowel loops are normal in appearance. The appendix is well seen and has a normal appearance. Colonic loops are normal in appearance.  Pelvis: The urinary bladder has a normal appearance. The prostate gland appears prominent. No free pelvic fluid.  Retroperitoneum: The abdominal aorta is not aneurysmal. No evidence for dissection. There is normal  opacification of the renal arteries, celiac axis, superior mesenteric, and inferior mesenteric arteries. Normal iliac arteries.  Abdominal wall: Unremarkable.  Osseous structures: Degenerative changes are present.  Review of the MIP images confirms the above findings.  IMPRESSION: 1. No evidence for a dissection or aneurysm. 2. Mild dependent change in atelectasis at the lung bases. 3. Normal appendix. 4. Normal appearance of the aortic branches.   Electronically Signed   By: Shon Hale M.D.   On: 05/19/2014 23:34   Ct Cta Abd/pel W/cm &/or W/o Cm  05/19/2014   CLINICAL DATA:  Right flank pain and back pain.  EXAM: CT ANGIOGRAPHY CHEST AND ABDOMEN  TECHNIQUE: Multidetector CT imaging of the chest and abdomen was performed using the standard protocol during bolus administration of intravenous contrast. Multiplanar CT image reconstructions and MIPs were obtained to evaluate the vascular anatomy.  CONTRAST:  155mL OMNIPAQUE IOHEXOL 350 MG/ML SOLN  COMPARISON:  Chest x-ray on 05/19/2014  FINDINGS: CTA CHEST FINDINGS  Heart:  The heart is mildly enlarged.  No pericardial effusion.  Vascular structures: Normal arch anatomy. No evidence for aneurysm or dissection.  Mediastinum/thyroid: No mediastinal, hilar, or  axillary adenopathy. The visualized portion of the thyroid gland has a normal appearance.  Lungs: No focal consolidations or pleural effusions. There is dependent change at both lung bases. Lingular atelectasis.  Chest wall/osseous structures: Moderate degenerative changes. No suspicious lesions.  Review of the MIP images confirms the above findings.  CTA ABDOMEN FINDINGS  Upper abdomen: No focal abnormality identified within the liver, spleen, pancreas, or adrenal glands. Bilateral renal cysts are present. The gallbladder is present.  Bowel: The stomach and small bowel loops are normal in appearance. The appendix is well seen and has a normal appearance. Colonic loops are normal in appearance.  Pelvis: The  urinary bladder has a normal appearance. The prostate gland appears prominent. No free pelvic fluid.  Retroperitoneum: The abdominal aorta is not aneurysmal. No evidence for dissection. There is normal opacification of the renal arteries, celiac axis, superior mesenteric, and inferior mesenteric arteries. Normal iliac arteries.  Abdominal wall: Unremarkable.  Osseous structures: Degenerative changes are present.  Review of the MIP images confirms the above findings.  IMPRESSION: 1. No evidence for a dissection or aneurysm. 2. Mild dependent change in atelectasis at the lung bases. 3. Normal appendix. 4. Normal appearance of the aortic branches.   Electronically Signed   By: Shon Hale M.D.   On: 05/19/2014 23:34    Microbiology: No results found for this or any previous visit (from the past 240 hour(s)).   Labs: Basic Metabolic Panel:  Recent Labs Lab 05/19/14 1744 05/19/14 1753 05/20/14 0600  NA 141 140 135*  K 4.3 4.2 4.0  CL 101 102 97  CO2 25  --  27  GLUCOSE 47* 48* 143*  BUN 16 16 14   CREATININE 1.30 1.30 1.12  CALCIUM 9.5  --  8.9   Liver Function Tests: No results found for this basename: AST, ALT, ALKPHOS, BILITOT, PROT, ALBUMIN,  in the last 168 hours No results found for this basename: LIPASE, AMYLASE,  in the last 168 hours No results found for this basename: AMMONIA,  in the last 168 hours CBC:  Recent Labs Lab 05/19/14 1744 05/19/14 1753 05/20/14 0600  WBC 9.7  --  8.3  NEUTROABS 6.0  --   --   HGB 12.7* 14.6 11.8*  HCT 38.4* 43.0 36.2*  MCV 82.8  --  82.8  PLT 270  --  279   Cardiac Enzymes:  Recent Labs Lab 05/19/14 [place] <0.30   BNP: BNP (last 3 results) No results found for this basename: PROBNP,  in the last 8760 hours CBG:  Recent Labs Lab 05/20/14 0735 05/20/14 1141 05/20/14 1650 05/20/14 2123 05/21/14 0645  GLUCAP 91 188* 206* 365* 385*       Signed:  Hashem Goynes A  [facility] 05/21/2014, 11:46  AM

## 2014-05-21 NOTE — Progress Notes (Addendum)
Occupational Therapy Treatment Patient Details Name: Ova Meegan MRN: [id number] DOB: 16-Feb-1946 Today's Date: 05/21/2014    History of present illness Friedrich Harriott is a 68 y.o. male with a history of IDDM, Hyperlipidemia, and recent Pulmonary Embolism on Coumadin rx who presents to the ED with complaints of severe pain in his right lower flank area x 1 week and increased pain in his neck that started in the AM upon awakening.     He denies having any trauma, and denies having any fever or chills.   He was seen by his PCP and sent to the ED to rule out a retroperitoneal hemorrhage since he is on Coumadin Rx.   OT comments  Moving well this am.  Able to ambulate to/from b.room without assist.  Stood for grooming tasks, no safety concerns noted.  Pt. Eager for d/c home.  Has foster children and grandchildren that he is very active in taking care of.   Follow Up Recommendations  No OT follow up;Supervision - Intermittent    Equipment Recommendations  3 in 1 bedside comode          Precautions / Restrictions Precautions Precautions: Fall Restrictions Weight Bearing Restrictions: No       Mobility Bed Mobility Overal bed mobility: Modified Independent Bed Mobility: Supine to Sit     Supine to sit: Modified independent (Device/Increase time)     General bed mobility comments: able to bring B les in/out of bed without assistance  Transfers Overall transfer level: Needs assistance Equipment used: None Transfers: Sit to/from Stand Sit to Stand: Supervision         General transfer comment: ambulated to/from b.room with out assistance    Balance             Standing balance-Leahy Scale: Good                     ADL Overall ADL's : Needs assistance/impaired Eating/Feeding: Set up;Bed level   Grooming: Wash/dry hands;Oral care;Supervision/safety;Standing                   Toilet Transfer: Supervision/safety;Regular Toilet;Grab bars    Toileting- Clothing Manipulation and Hygiene: Supervision/safety;Sit to/from stand       Functional mobility during ADLs: Supervision/safety General ADL Comments: Pt. amb. to/from b.room with S.  stood for grooming tasks no LOB or safety concerns noted.  reports he has wife at home and grandson that are able to assist.                                                                                              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 Has several classic mustangs.  Goes to car shows with a group of friends    Pertinent Vitals/ Pain       Pain Assessment: No/denies pain  Frequency Min 2X/week     Progress Toward Goals  OT Goals(current goals can now be found in the care plan section)  Progress towards OT goals: Progressing toward goals     Plan Discharge plan remains appropriate                     End of Session     Activity Tolerance Patient tolerated treatment well   Patient Left in bed;with call bell/phone within reach;with bed alarm set   Nurse Communication Other (comment) (pts. wife requests call from md or rn for updates)        Time: 0829-0902 OT Time Calculation (min): 33 min  Charges: OT General Charges $OT Visit: 1 Procedure OT Treatments $Self Care/Home Management : 23-37 mins  Janice Coffin , COTA/L 05/21/2014, 9:10 AM

## 2014-05-22 LAB — GLUCOSE, CAPILLARY
Glucose-Capillary: 235 mg/dL — ABNORMAL HIGH (ref 70–99)
Glucose-Capillary: 330 mg/dL — ABNORMAL HIGH (ref 70–99)

## 2014-05-22 LAB — PROTIME-INR
INR: 2.17 — ABNORMAL HIGH (ref 0.00–1.49)
Prothrombin Time: 24.2 seconds — ABNORMAL HIGH (ref 11.6–15.2)

## 2014-05-22 MED ORDER — INSULIN ASPART 100 UNIT/ML ~~LOC~~ SOLN
3.0000 [IU] | Freq: Once | SUBCUTANEOUS | Status: AC
  Administered 2014-05-22: 3 [IU] via SUBCUTANEOUS

## 2014-05-22 MED ORDER — DEXAMETHASONE 2 MG PO TABS
2.0000 mg | ORAL_TABLET | Freq: Once | ORAL | Status: AC
  Administered 2014-05-22: 2 mg via ORAL
  Filled 2014-05-22: qty 1

## 2014-05-22 NOTE — Progress Notes (Signed)
D/C orders received. Pt educated on d/c instructions and verbalized understanding. Pt handed d/c packet and three prescriptions. IV removed. Pt taken downstairs by staff via wheelchair.

## 2014-07-29 ENCOUNTER — Ambulatory Visit: Payer: Commercial Managed Care - HMO | Attending: Neurosurgery

## 2014-07-29 ENCOUNTER — Telehealth: Payer: Self-pay | Admitting: *Deleted

## 2014-07-29 DIAGNOSIS — M542 Cervicalgia: Secondary | ICD-10-CM | POA: Insufficient documentation

## 2014-07-29 DIAGNOSIS — M4712 Other spondylosis with myelopathy, cervical region: Secondary | ICD-10-CM | POA: Diagnosis present

## 2014-07-29 NOTE — Therapy (Signed)
Physical Therapy Evaluation  Patient Details  Name: Matthew Zimmerman MRN: [id number] Date of Birth: Dec 27, 1945  Encounter Date: 07/29/2014      PT End of Session - 07/29/14 1006    Visit Number 1   Number of Visits 12   Date for PT Re-Evaluation 08/28/14   PT Start Time 0930   PT Stop Time 1030   PT Time Calculation (min) 60 min   Equipment Utilized During Treatment --  Dealer tractio   Activity Tolerance Patient limited by pain   Behavior During Therapy WFL for tasks assessed/performed      Past Medical History  Diagnosis Date  . Diabetes mellitus without complication   . GERD (gastroesophageal reflux disease)   . Hyperlipidemia   . Carpal tunnel syndrome on both sides   . PE (pulmonary embolism)     Past Surgical History  Procedure Laterality Date  . Spine surgery  09/04/1985    lumbar spine  . Elbow surgery      BONE SPURS REMOVED B ELBOWS  . Foot surgery      HEEL SPURS REMOVED B HEELS  . Toe surgery      There were no vitals taken for this visit.  Visit Diagnosis:  Cervicalgia - Plan: PT plan of care cert/re-cert  Spondylosis, cervical, with myelopathy - Plan: PT plan of care cert/re-cert      Subjective Assessment - 07/29/14 0936    Symptoms Having neck pain into shoulders and with numbness into Rt and Lt fingers to lateral 3 fingers, He reports the neurosurgeon gave option for surgery.    Pertinent History Not specific injury   Limitations Sitting;House hold activities  Driving with hitting items in road, turning to see while driving   Diagnostic tests MRI   Patient Stated Goals Get better   Currently in Pain? Yes   Pain Score 7    Pain Location Neck  shoulders and arms, base of skull   Pain Orientation Posterior;Right;Left   Pain Descriptors / Indicators Constant;Heaviness;Aching;Throbbing   Pain Type Chronic pain   Pain Onset More than a month ago   Pain Frequency Constant   Aggravating Factors  Driving , turning head   Pain Relieving  Factors Medications   Multiple Pain Sites No          [facility] PT Assessment - 07/29/14 0943    Assessment   Medical Diagnosis myelopathy   Onset Date --  7 months ago   Next MD Visit --  next week   Prior Therapy --  none   Precautions   Precautions None   Restrictions   Weight Bearing Restrictions No   Balance Screen   Has the patient fallen in the past 6 months No   Has the patient had a decrease in activity level because of a fear of falling?  No   Is the patient reluctant to leave their home because of a fear of falling?  No   Prior Function   Level of Independence Independent with basic ADLs   Vocation Retired   Haematologist Postural limitations   Postural Limitations Forward head;Decreased lumbar lordosis   AROM   Right Shoulder Flexion --  120 degrees   Right Shoulder ABduction --  123 degrees   Right Shoulder Internal Rotation --  5 degrees   Right Shoulder External Rotation --  80 degrees   Left Shoulder Flexion --  95 degrees   Left Shoulder ABduction --  100 degrees  Left Shoulder Internal Rotation --  5 degrees   Left Shoulder External Rotation --  65 degrees   Cervical Flexion --  15 degrees   Cervical Extension --  12 degrees   Cervical - Right Side Bend --  10 degrees   Cervical - Left Side Bend --  10 degrees   Cervical - Right Rotation --  10 degrees   Cervical - Left Rotation --  15 degrees   Strength   Overall Strength Within functional limits for tasks performed              PT Short Term Goals - August 03, 2014 1010    PT SHORT TERM GOAL #1   Title Independent in intial HEP   Time 3   Period Weeks   Status New   PT SHORT TERM GOAL #2   Title Report pain decr 20% or more in neck   Time 3   Period Weeks   Status New   PT SHORT TERM GOAL #3   Title Verbalize awareneess of proper posture   Time 3   Period Weeks   Status New          PT Long Term Goals - August 03, 2014 1011    PT LONG  TERM GOAL #1   Title Pt will report 50% decr pain in neck andd   arms   Time 6   Period Weeks   PT LONG TERM GOAL #2   Title Improve active cervical rotation to 40 degrees RT and LT to improve vision with driving   Time 6   Period Weeks   Status New   PT LONG TERM GOAL #3   Title He will report no neckpain with hitting objects in road while driving   Time 6   Period Days   Status New   PT LONG TERM GOAL #4   Title He willl be independnet with advance HEP for range of neck   Time 6   Period Weeks   Status New          Plan - August 03, 2014 1007    Clinical Impression Statement Mr Schueler has significant pain and postural/motion limitations tha appear somewhat   long standing. He may benefit from a trial of PT to improve motion and pain   Pt will benefit from skilled therapeutic intervention in order to improve on the following deficits Impaired flexibility;Postural dysfunction;Pain;Decreased mobility   Rehab Potential Fair   PT Frequency 2x / week   PT Duration 6 weeks  if improving at 4 weeks   PT Treatment/Interventions Electrical Stimulation;Ultrasound;Traction;Moist Heat;Therapeutic exercise;Passive range of motion;Manual techniques   PT Next Visit Plan MAnual treatments , modalities, posture education with HEP   PT Home Exercise Plan postural awareness   Consulted and Agree with Plan of Care Patient          G-Codes - 2014/08/03 1132    Functional Assessment Tool Used Clinical judgment   Functional Limitation Changing and maintaining body position   Changing and Maintaining Body Position Current Status (W1027) At least 60 percent but less than 80 percent impaired, limited or restricted   Changing and Maintaining Body Position Goal Status (O5366) At least 40 percent but less than 60 percent impaired, limited or restricted      Problem List Patient Active Problem List   Diagnosis Date Noted  . Neck pain 05/20/2014  . Intractable back pain 05/20/2014  .  Warfarin-induced coagulopathy 05/20/2014  . DM 05/12/2010  . HYPERTENSION 05/12/2010  .  GERD 05/12/2010  . COLONIC POLYPS, HX OF 05/12/2010                                              Darrel Hoover PT 07/29/2014, 11:41 AM

## 2014-07-29 NOTE — Telephone Encounter (Signed)
appts made and printed...td 

## 2014-08-05 ENCOUNTER — Ambulatory Visit: Payer: Commercial Managed Care - HMO | Attending: Neurosurgery

## 2014-08-05 DIAGNOSIS — M542 Cervicalgia: Secondary | ICD-10-CM | POA: Diagnosis not present

## 2014-08-05 DIAGNOSIS — M4712 Other spondylosis with myelopathy, cervical region: Secondary | ICD-10-CM | POA: Diagnosis present

## 2014-08-05 NOTE — Patient Instructions (Signed)
  Scapular Retraction (Standing)   With arms at sides, pinch shoulder blades together. Repeat _10___ times per set. Do __2__ sets per session. Do __3__ sessions per day.  http://orth.exer.us/944  Posture - Sitting   Sit upright, head facing forward. Try using a roll to support lower back. Keep shoulders relaxed, and avoid rounded back. Keep hips level with knees. Avoid crossing legs for long periods.

## 2014-08-05 NOTE — Therapy (Signed)
Outpatient Rehabilitation [facility] [address] [place], [place], [address] Phone: [phone]   Fax:  [phone]  Physical Therapy Treatment  Patient Details  Name: Matthew Zimmerman MRN: [id number] Date of Birth: October 15, 1945  Encounter Date: 08/05/2014      PT End of Session - 08/05/14 1013    Visit Number 2   Number of Visits 12   Date for PT Re-Evaluation 08/28/14   PT Start Time 0932   PT Stop Time 1015   PT Time Calculation (min) 43 min   Activity Tolerance Patient tolerated treatment well      Past Medical History  Diagnosis Date  . Diabetes mellitus without complication   . GERD (gastroesophageal reflux disease)   . Hyperlipidemia   . Carpal tunnel syndrome on both sides   . PE (pulmonary embolism)     Past Surgical History  Procedure Laterality Date  . Spine surgery  09/04/1985    lumbar spine  . Elbow surgery      BONE SPURS REMOVED B ELBOWS  . Foot surgery      HEEL SPURS REMOVED B HEELS  . Toe surgery      There were no vitals taken for this visit.  Visit Diagnosis:  Neck pain      Subjective Assessment - 08/05/14 0945    Symptoms Pt reports significant improvements since last visit with decreased pain to 2/10 and improvements with lying supine and sleeping at night following 1st visit.    Limitations Sitting;House hold activities   Currently in Pain? Yes   Pain Score 2    Pain Location Neck            [facility] Adult PT Treatment/Exercise - 08/05/14 0946    Exercises   Exercises Neck   Neck Exercises: Stretches   Upper Trapezius Stretch 3 reps;30 seconds   Upper Trapezius Stretch Limitations manual therapy bil   Chest Stretch 3 reps;30 seconds  Pect stretch supine   Neck Exercises: Theraband   Scapula Retraction 10 reps   Scapula Retraction Limitations HEP- no resistance   Modalities   Modalities Moist Heat;Traction  cervical mechanical traction 5 min 15 max 10 mins 1 min hold   Moist Heat Therapy   Number Minutes  Moist Heat 10 Minutes   Moist Heat Location Other (comment)  cervical   Manual Therapy   Manual Therapy Myofascial release   Myofascial Release STM to cervical paraspinals, MFR, and SOR, with manual traction  UT stretches manual          PT Education - 08/05/14 1012    Education provided Yes   Education Details added posture and scap squeeze to HEP.   Person(s) Educated Patient   Methods Explanation;Verbal cues;Handout;Tactile cues;Demonstration   Comprehension Verbalized understanding;Verbal cues required;Tactile cues required;Need further instruction              Plan - 08/05/14 1013    Clinical Impression Statement Significant improvement since last visit.  Improvements with functional mobility and less pain. Pt no longer has to hold his head when transferring sit<> supine.    PT Next Visit Plan MAnual treatments , modalities, posture education with HEP, traction                               Problem List Patient Active Problem List   Diagnosis Date Noted  . Neck pain 05/20/2014  . Intractable back pain 05/20/2014  . Warfarin-induced coagulopathy 05/20/2014  .  DM 05/12/2010  . HYPERTENSION 05/12/2010  . GERD 05/12/2010  . COLONIC POLYPS, HX OF 05/12/2010  Dollene Cleveland, PT, DPT 08/05/2014 10:17 AM Phone: [phone] Fax: [phone]

## 2014-08-06 ENCOUNTER — Ambulatory Visit: Payer: Commercial Managed Care - HMO

## 2014-08-06 DIAGNOSIS — M4712 Other spondylosis with myelopathy, cervical region: Secondary | ICD-10-CM | POA: Diagnosis not present

## 2014-08-06 DIAGNOSIS — M542 Cervicalgia: Secondary | ICD-10-CM

## 2014-08-06 NOTE — Therapy (Signed)
Outpatient Rehabilitation [facility] [address] [place], [place], [address] Phone: [phone]   Fax:  [phone]  Physical Therapy Treatment  Patient Details  Name: Matthew Zimmerman MRN: [id number] Date of Birth: 10-May-1946  Encounter Date: 08/06/2014      PT End of Session - 08/06/14 1142    Visit Number 3   Number of Visits 12   Date for PT Re-Evaluation 08/28/14   PT Start Time 1102   PT Stop Time 1150   PT Time Calculation (min) 48 min   Activity Tolerance Patient tolerated treatment well      Past Medical History  Diagnosis Date  . Diabetes mellitus without complication   . GERD (gastroesophageal reflux disease)   . Hyperlipidemia   . Carpal tunnel syndrome on both sides   . PE (pulmonary embolism)     Past Surgical History  Procedure Laterality Date  . Spine surgery  09/04/1985    lumbar spine  . Elbow surgery      BONE SPURS REMOVED B ELBOWS  . Foot surgery      HEEL SPURS REMOVED B HEELS  . Toe surgery      There were no vitals taken for this visit.  Visit Diagnosis:  Neck pain      Subjective Assessment - 08/06/14 1107    Symptoms Pt continues to be pleased with his improvements, with even less pain since yesterday, rating 1/10, despite an exacerbation yesterday from fixing Christmas lights. Pt denies pain / radicular pain into hands.    Pertinent History Not specific injury   Limitations Sitting;House hold activities   Currently in Pain? Yes   Pain Score 1    Pain Location Neck   Pain Orientation Posterior   Pain Descriptors / Indicators Constant   Pain Type Chronic pain   Pain Onset More than a month ago            [facility] Adult PT Treatment/Exercise - 08/06/14 1109    Exercises   Exercises Neck   Neck Exercises: Stretches   Upper Trapezius Stretch 3 reps;30 seconds   Upper Trapezius Stretch Limitations manual therapy bil   Chest Stretch --   Neck Exercises: Theraband   Scapula Retraction 10 reps   Scapula  Retraction Limitations 5 sec hold    Modalities   Modalities Moist Heat;Traction  cervical mechanical traction 5 min 15 max 10 mins 1 min hold   Moist Heat Therapy   Number Minutes Moist Heat 10 Minutes   Moist Heat Location Other (comment)  cervical   Manual Therapy   Manual Therapy Myofascial release   Myofascial Release MFR, STM, and SOR  seated manual distraction          PT Education - 08/06/14 1142    Education provided No              Plan - 08/06/14 1142    Clinical Impression Statement Improved cervical rotation AROM by 3 degrees in each direction following seated manual distraction.  pt continues to progress toward goals with less pain.    Pt will benefit from skilled therapeutic intervention in order to improve on the following deficits Impaired flexibility;Postural dysfunction;Pain;Decreased mobility   Rehab Potential Fair   PT Next Visit Plan MAnual treatments , modalities, posture education with HEP, traction, Progress stretches for HEP:    PT Home Exercise Plan UT and LS stretches, pect stretches.  Problem List Patient Active Problem List   Diagnosis Date Noted  . Neck pain 05/20/2014  . Intractable back pain 05/20/2014  . Warfarin-induced coagulopathy 05/20/2014  . DM 05/12/2010  . HYPERTENSION 05/12/2010  . GERD 05/12/2010  . COLONIC POLYPS, HX OF 05/12/2010     Dollene Cleveland, PT, DPT 08/06/2014 11:46 AM Phone: [phone] Fax: [phone]

## 2014-08-11 ENCOUNTER — Encounter: Payer: Medicare HMO | Admitting: Physical Therapy

## 2014-08-12 ENCOUNTER — Ambulatory Visit: Payer: Commercial Managed Care - HMO | Admitting: Rehabilitation

## 2014-08-12 DIAGNOSIS — M542 Cervicalgia: Secondary | ICD-10-CM

## 2014-08-12 DIAGNOSIS — M4712 Other spondylosis with myelopathy, cervical region: Secondary | ICD-10-CM

## 2014-08-12 NOTE — Patient Instructions (Signed)
Levator Stretch   Grasp seat or sit on hand on side to be stretched. Turn head toward other side and look down. Use hand on head to gently stretch neck in that position. Hold __10__ seconds. Repeat on other side. Repeat ___3_ times. Do ___2_ sessions per day.  http://gt2.exer.us/30   Copyright  VHI. All rights reserved.  Side-Bending   One hand on opposite side of head, pull head to side as far as is comfortable. Stop if there is pain. Hold __10__ seconds. Repeat with other hand to other side. Repeat __3__ times. Do ___2_ sessions per day.   Copyright  VHI. All rights reserved.  Scapular Retraction (Standing)   With arms at sides, pinch shoulder blades together. Repeat __10__ times per set. Do __1__ sets per session. Do __2__ sessions per day.  http://orth.exer.us/944   Copyright  VHI. All rights reserved.  Chin Protraction / Retraction   Slide head forward keeping chin level. Slide head back, pulling chin in. Hold each position __5_ seconds. Repeat _10__ times. Do _2__ sessions per day.  Copyright  VHI. All rights reserved.

## 2014-08-12 NOTE — Therapy (Signed)
Outpatient Rehabilitation [facility] [address] [place], [place], [address] Phone: [phone]   Fax:  [phone]  Physical Therapy Treatment  Patient Details  Name: Matthew Zimmerman MRN: [id number] Date of Birth: July 01, 1946  Encounter Date: 08/12/2014      PT End of Session - 08/12/14 1101    Visit Number 4   Number of Visits 12   Date for PT Re-Evaluation 08/28/14   PT Start Time 2353   PT Stop Time 1125   PT Time Calculation (min) 70 min      Past Medical History  Diagnosis Date  . Diabetes mellitus without complication   . GERD (gastroesophageal reflux disease)   . Hyperlipidemia   . Carpal tunnel syndrome on both sides   . PE (pulmonary embolism)     Past Surgical History  Procedure Laterality Date  . Spine surgery  09/04/1985    lumbar spine  . Elbow surgery      BONE SPURS REMOVED B ELBOWS  . Foot surgery      HEEL SPURS REMOVED B HEELS  . Toe surgery      There were no vitals taken for this visit.  Visit Diagnosis:  Neck pain  Cervicalgia  Spondylosis, cervical, with myelopathy      Subjective Assessment - 08/12/14 1030    Symptoms Pt reports he fell asleep in his chair after last treatment and awoke with his whole Right arm was numb   Currently in Pain? Yes   Pain Score 1    Pain Location Neck   Pain Orientation Right;Posterior   Pain Descriptors / Indicators Constant   Pain Type Chronic pain   Aggravating Factors  driving, turning head    Pain Relieving Factors meds, heat          [facility] PT Assessment - 08/12/14 0001    AROM   Cervical - Right Side Bend 18   Cervical - Left Side Bend 18   Cervical - Right Rotation 45   Cervical - Left Rotation 45          [facility] Adult PT Treatment/Exercise - 08/12/14 0001    Neck Exercises: Stretches   Upper Trapezius Stretch 3 reps;30 seconds   Levator Stretch 3 reps;10 seconds   Neck Exercises: Theraband   Scapula Retraction 10 reps  no band   Scapula Retraction Limitations  5 sec hold    Shoulder Extension 10 reps;Red   Rows 10 reps;Red   Neck Exercises: Seated   Neck Retraction 10 reps   Modalities   Modalities Moist Heat;Traction  Cervical Mechanical traction 15#/6#, 60sec/15 sec x 15 min   Moist Heat Therapy   Number Minutes Moist Heat 10 Minutes   Moist Heat Location Other (comment)  cervical   Manual Therapy   Manual Therapy Passive ROM   Passive ROM upper traps and levator PROM supine                Plan - 08/12/14 1102    Clinical Impression Statement Cervical AROM improved, continued pain with sleep positioning   PT Next Visit Plan check goals, continue traction and manual, reprint this HEP, forgot to give to patient        Problem List Patient Active Problem List   Diagnosis Date Noted  . Neck pain 05/20/2014  . Intractable back pain 05/20/2014  . Warfarin-induced coagulopathy 05/20/2014  . DM 05/12/2010  . HYPERTENSION 05/12/2010  . GERD 05/12/2010  . COLONIC POLYPS, HX OF 05/12/2010    Hyacinth Meeker,  Dereck Leep, PTA 08/12/2014, 12:30 PM

## 2014-08-13 ENCOUNTER — Ambulatory Visit: Payer: Commercial Managed Care - HMO | Admitting: Physical Therapy

## 2014-08-18 ENCOUNTER — Ambulatory Visit: Payer: Commercial Managed Care - HMO | Admitting: Physical Therapy

## 2014-08-20 ENCOUNTER — Ambulatory Visit: Payer: Commercial Managed Care - HMO | Admitting: Rehabilitation

## 2014-08-20 DIAGNOSIS — M4712 Other spondylosis with myelopathy, cervical region: Secondary | ICD-10-CM | POA: Diagnosis not present

## 2014-08-20 DIAGNOSIS — M542 Cervicalgia: Secondary | ICD-10-CM

## 2014-08-20 NOTE — Therapy (Signed)
Outpatient Rehabilitation [facility] [address] [place], [place], [address] Phone: [phone]   Fax:  [phone]  Physical Therapy Treatment  Patient Details  Name: Matthew Zimmerman MRN: [id number] Date of Birth: 26-Mar-1946  Encounter Date: 08/20/2014      PT End of Session - 08/20/14 0904    Visit Number 5   Number of Visits 12   Date for PT Re-Evaluation 08/28/14   PT Start Time 0848      Past Medical History  Diagnosis Date  . Diabetes mellitus without complication   . GERD (gastroesophageal reflux disease)   . Hyperlipidemia   . Carpal tunnel syndrome on both sides   . PE (pulmonary embolism)     Past Surgical History  Procedure Laterality Date  . Spine surgery  09/04/1985    lumbar spine  . Elbow surgery      BONE SPURS REMOVED B ELBOWS  . Foot surgery      HEEL SPURS REMOVED B HEELS  . Toe surgery      There were no vitals taken for this visit.  Visit Diagnosis:  Neck pain  Cervicalgia  Spondylosis, cervical, with myelopathy      Subjective Assessment - 08/20/14 0851    Symptoms Saturday morning pt reports he went into a Diabetc Coma and EMS came to his home and gave him an injection to raise blood sugar. His MD, then changed Diabetes medicine which has decreased pain in hips. Pt reports decreased neck pain thus far. He no longer has constant resting pain. It is now only with Cervical motions, He does report continued bilateral UE numbness with supine positions, relieved by position change.   Pain Score 5    Pain Location Neck   Pain Orientation Right;Left  worse on right side   Pain Descriptors / Indicators Sharp   Pain Frequency Intermittent   Aggravating Factors  turning head   Pain Relieving Factors meds heat   Multiple Pain Sites No          [facility] PT Assessment - 08/20/14 0932    AROM   Cervical - Right Side Bend 25  measurements taken after Manual and Stretching   Cervical - Left Side Bend 25   Cervical - Right  Rotation 50   Cervical - Left Rotation 50          [facility] Adult PT Treatment/Exercise - 08/20/14 0927    Neck Exercises: Stretches   Upper Trapezius Stretch 3 reps;30 seconds   Levator Stretch 3 reps;10 seconds   Neck Exercises: Theraband   Scapula Retraction 10 reps  no band x10 with red band x 10   Scapula Retraction Limitations 5 sec hold    Neck Exercises: Supine   Neck Retraction 10 reps;5 secs   Modalities   Modalities Moist Heat;Traction  Cervical Mechanical traction 20#/10#, 60sec/15 sec x 15 min   Moist Heat Therapy   Number Minutes Moist Heat 15 Minutes   Moist Heat Location --  neck   Manual Therapy   Manual Therapy Massage   Massage suboccititals levator and upper traps   Passive ROM cervical side bend and rotation             PT Short Term Goals - 08/20/14 0905    PT SHORT TERM GOAL #1   Title Independent in intial HEP   Time 3   Status Achieved   PT SHORT TERM GOAL #2   Title Report pain decr 20% or more in neck  Time 3   Period Weeks   Status Achieved   PT SHORT TERM GOAL #3   Title Verbalize awareneess of proper posture   Time 3   Period Weeks   Status Achieved          PT Long Term Goals - 08/20/14 0906    PT LONG TERM GOAL #1   Title Pt will report 50% decr pain in neck andd   arms   Time 6   Period Weeks   Status On-going   PT LONG TERM GOAL #2   Title Improve active cervical rotation to 40 degrees RT and LT to improve vision with driving   Time 6   Period Weeks   Status Achieved   PT LONG TERM GOAL #3   Title He will report no neckpain with hitting objects in road while driving   Time 6   Period Days   Status Achieved   PT LONG TERM GOAL #4   Title He willl be independnet with advance HEP for range of neck   Time 6   Period Weeks   Status On-going          Plan - 08/20/14 0907    Clinical Impression Statement Improved resting pain, I with initial HEP, 20% decrease in overall neck pain, aware of posture benefits, no  longer has neck pain with driving and hitting bumps in the road, continued cervical ROM deficits. All STGs MET, LTG#2& 3 MET.    PT Next Visit Plan Renewal, Continue traction and manual and postural exercises.        Problem List Patient Active Problem List   Diagnosis Date Noted  . Neck pain 05/20/2014  . Intractable back pain 05/20/2014  . Warfarin-induced coagulopathy 05/20/2014  . DM 05/12/2010  . HYPERTENSION 05/12/2010  . GERD 05/12/2010  . COLONIC POLYPS, HX OF 05/12/2010    Dorene Ar, PTA 08/20/2014, 9:56 AM

## 2014-08-25 ENCOUNTER — Ambulatory Visit: Payer: Commercial Managed Care - HMO | Admitting: Physical Therapy

## 2014-08-25 DIAGNOSIS — M4712 Other spondylosis with myelopathy, cervical region: Secondary | ICD-10-CM | POA: Diagnosis not present

## 2014-08-25 DIAGNOSIS — M542 Cervicalgia: Secondary | ICD-10-CM
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
## 2014-09-01 ENCOUNTER — Ambulatory Visit: Payer: Commercial Managed Care - HMO | Admitting: Physical Therapy

## 2014-09-03 ENCOUNTER — Ambulatory Visit: Payer: Commercial Managed Care - HMO | Admitting: Physical Therapy

## 2014-09-14 ENCOUNTER — Emergency Department (INDEPENDENT_AMBULATORY_CARE_PROVIDER_SITE_OTHER): Payer: Medicare HMO

## 2014-09-14 ENCOUNTER — Emergency Department (INDEPENDENT_AMBULATORY_CARE_PROVIDER_SITE_OTHER)
Admission: EM | Admit: 2014-09-14 | Discharge: 2014-09-14 | Disposition: A | Discharge status: Home / Self Care | Payer: Medicare HMO | Source: Home / Self Care | Attending: Family Medicine | Admitting: Family Medicine

## 2014-09-14 ENCOUNTER — Encounter (HOSPITAL_COMMUNITY): Payer: Self-pay

## 2014-09-14 DIAGNOSIS — S61259A Open bite of unspecified finger without damage to nail, initial encounter: Secondary | ICD-10-CM | POA: Diagnosis not present

## 2014-09-14 DIAGNOSIS — W503XXA Accidental bite by another person, initial encounter: Secondary | ICD-10-CM

## 2014-09-14 DIAGNOSIS — T1490 Injury, unspecified: Secondary | ICD-10-CM

## 2014-09-14 DIAGNOSIS — T1490XA Injury, unspecified, initial encounter: Secondary | ICD-10-CM

## 2014-09-14 DIAGNOSIS — S62521A Displaced fracture of distal phalanx of right thumb, initial encounter for closed fracture: Secondary | ICD-10-CM | POA: Diagnosis not present

## 2014-09-14 MED ORDER — TETANUS-DIPHTH-ACELL PERTUSSIS 5-2.5-18.5 LF-MCG/0.5 IM SUSP
0.5000 mL | Freq: Once | INTRAMUSCULAR | Status: AC
  Administered 2014-09-14: 0.5 mL via INTRAMUSCULAR

## 2014-09-14 MED ORDER — CLINDAMYCIN HCL 300 MG PO CAPS: 300.0000 mg | ORAL_CAPSULE | Freq: Three times a day (TID) | ORAL | Status: DC

## 2014-09-14 MED ORDER — TETANUS-DIPHTH-ACELL PERTUSSIS 5-2.5-18.5 LF-MCG/0.5 IM SUSP
INTRAMUSCULAR | Status: AC
  Filled 2014-09-14: qty 0.5
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
## 2014-09-14 NOTE — Discharge Instructions (Signed)
Wear splint for comfort, take medicine as prescribed, see orthopedist in 2-3 days for recheck. You had a tetanus booster.
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
## 2014-09-18 ENCOUNTER — Inpatient Hospital Stay (HOSPITAL_COMMUNITY): Payer: Commercial Managed Care - HMO | Admitting: Anesthesiology

## 2014-09-18 ENCOUNTER — Ambulatory Visit (HOSPITAL_COMMUNITY)
Admission: EM | Admit: 2014-09-18 | Discharge: 2014-09-18 | Disposition: A | Discharge status: Home / Self Care | Payer: Commercial Managed Care - HMO | Source: Ambulatory Visit | Attending: Orthopedic Surgery | Admitting: Orthopedic Surgery

## 2014-09-18 ENCOUNTER — Encounter (HOSPITAL_COMMUNITY): Payer: Self-pay | Admitting: *Deleted

## 2014-09-18 ENCOUNTER — Encounter (HOSPITAL_COMMUNITY)
Admission: EM | Disposition: A | Discharge status: Home / Self Care | Payer: Self-pay | Source: Ambulatory Visit | Attending: Orthopedic Surgery

## 2014-09-18 DIAGNOSIS — G5602 Carpal tunnel syndrome, left upper limb: Secondary | ICD-10-CM | POA: Insufficient documentation

## 2014-09-18 DIAGNOSIS — W503XXA Accidental bite by another person, initial encounter: Secondary | ICD-10-CM | POA: Diagnosis not present

## 2014-09-18 DIAGNOSIS — Z87891 Personal history of nicotine dependence: Secondary | ICD-10-CM | POA: Insufficient documentation

## 2014-09-18 DIAGNOSIS — S62521B Displaced fracture of distal phalanx of right thumb, initial encounter for open fracture: Secondary | ICD-10-CM | POA: Diagnosis not present

## 2014-09-18 DIAGNOSIS — S62521A Displaced fracture of distal phalanx of right thumb, initial encounter for closed fracture: Secondary | ICD-10-CM | POA: Insufficient documentation

## 2014-09-18 DIAGNOSIS — Z79899 Other long term (current) drug therapy: Secondary | ICD-10-CM | POA: Diagnosis not present

## 2014-09-18 DIAGNOSIS — Z7901 Long term (current) use of anticoagulants: Secondary | ICD-10-CM | POA: Diagnosis not present

## 2014-09-18 DIAGNOSIS — E785 Hyperlipidemia, unspecified: Secondary | ICD-10-CM | POA: Insufficient documentation

## 2014-09-18 DIAGNOSIS — Z86711 Personal history of pulmonary embolism: Secondary | ICD-10-CM | POA: Insufficient documentation

## 2014-09-18 DIAGNOSIS — G5601 Carpal tunnel syndrome, right upper limb: Secondary | ICD-10-CM | POA: Insufficient documentation

## 2014-09-18 DIAGNOSIS — E119 Type 2 diabetes mellitus without complications: Secondary | ICD-10-CM | POA: Insufficient documentation

## 2014-09-18 DIAGNOSIS — Z888 Allergy status to other drugs, medicaments and biological substances status: Secondary | ICD-10-CM | POA: Diagnosis not present

## 2014-09-18 DIAGNOSIS — Y929 Unspecified place or not applicable: Secondary | ICD-10-CM | POA: Insufficient documentation

## 2014-09-18 DIAGNOSIS — K219 Gastro-esophageal reflux disease without esophagitis: Secondary | ICD-10-CM | POA: Insufficient documentation

## 2014-09-18 DIAGNOSIS — Z794 Long term (current) use of insulin: Secondary | ICD-10-CM | POA: Insufficient documentation

## 2014-09-18 DIAGNOSIS — S61451A Open bite of right hand, initial encounter: Secondary | ICD-10-CM | POA: Diagnosis not present

## 2014-09-18 HISTORY — DX: Headache, unspecified: R51.9

## 2014-09-18 HISTORY — PX: I&D EXTREMITY: SHX5045

## 2014-09-18 HISTORY — DX: Headache: R51

## 2014-09-18 HISTORY — DX: Reserved for inherently not codable concepts without codable children: IMO0001

## 2014-09-18 LAB — BASIC METABOLIC PANEL
Anion gap: 6 (ref 5–15)
BUN: 11 mg/dL (ref 6–23)
CO2: 29 mmol/L (ref 19–32)
Calcium: 9.4 mg/dL (ref 8.4–10.5)
Chloride: 106 mEq/L (ref 96–112)
Creatinine, Ser: 1.11 mg/dL (ref 0.50–1.35)
GFR calc Af Amer: 76 mL/min — ABNORMAL LOW (ref >90)
GFR calc non Af Amer: 66 mL/min — ABNORMAL LOW (ref >90)
Glucose, Bld: 82 mg/dL (ref 70–99)
Potassium: 4.1 mmol/L (ref 3.5–5.1)
Sodium: 141 mmol/L (ref 135–145)

## 2014-09-18 LAB — CBC
HCT: 34.2 % — ABNORMAL LOW (ref 39.0–52.0)
Hemoglobin: 10.6 g/dL — ABNORMAL LOW (ref 13.0–17.0)
MCH: 25.7 pg — ABNORMAL LOW (ref 26.0–34.0)
MCHC: 31 g/dL (ref 30.0–36.0)
MCV: 83 fL (ref 78.0–100.0)
Platelets: 385 10*3/uL (ref 150–400)
RBC: 4.12 MIL/uL — ABNORMAL LOW (ref 4.22–5.81)
RDW: 16.7 % — ABNORMAL HIGH (ref 11.5–15.5)
WBC: 5.8 10*3/uL (ref 4.0–10.5)

## 2014-09-18 LAB — PROTIME-INR
INR: 2.48 — ABNORMAL HIGH (ref 0.00–1.49)
Prothrombin Time: 27 seconds — ABNORMAL HIGH (ref 11.6–15.2)

## 2014-09-18 LAB — GLUCOSE, CAPILLARY
Glucose-Capillary: 130 mg/dL — ABNORMAL HIGH (ref 70–99)
Glucose-Capillary: 54 mg/dL — ABNORMAL LOW (ref 70–99)
Glucose-Capillary: 61 mg/dL — ABNORMAL LOW (ref 70–99)
Glucose-Capillary: 127 mg/dL — ABNORMAL HIGH (ref 70–99)
Glucose-Capillary: 96 mg/dL (ref 70–99)

## 2014-09-18 LAB — APTT: aPTT: 54 seconds — ABNORMAL HIGH (ref 24–37)

## 2014-09-18 SURGERY — IRRIGATION AND DEBRIDEMENT EXTREMITY
Anesthesia: Monitor Anesthesia Care | Site: Thumb | Laterality: Right

## 2014-09-18 MED ORDER — LACTATED RINGERS IV SOLN
INTRAVENOUS | Status: DC
  Administered 2014-09-18: 16:00:00 via INTRAVENOUS

## 2014-09-18 MED ORDER — BUPIVACAINE-EPINEPHRINE (PF) 0.25% -1:200000 IJ SOLN
INTRAMUSCULAR | Status: AC
  Filled 2014-09-18: qty 30

## 2014-09-18 MED ORDER — DEXTROSE 50 % IV SOLN
1.0000 | Freq: Once | INTRAVENOUS | Status: AC
Start: 2014-09-18 — End: 2014-09-18
  Administered 2014-09-18: 50 mL via INTRAVENOUS
  Filled 2014-09-18: qty 50

## 2014-09-18 MED ORDER — DEXTROSE 50 % IV SOLN
INTRAVENOUS | Status: AC
  Administered 2014-09-18: 50 mL via INTRAVENOUS
  Filled 2014-09-18: qty 50

## 2014-09-18 MED ORDER — OXYCODONE-ACETAMINOPHEN 5-325 MG PO TABS: 1.0000 | ORAL_TABLET | ORAL | Status: DC | PRN

## 2014-09-18 MED ORDER — ONDANSETRON HCL 4 MG/2ML IJ SOLN
INTRAMUSCULAR | Status: AC
  Filled 2014-09-18: qty 2

## 2014-09-18 MED ORDER — LACTATED RINGERS IV SOLN
INTRAVENOUS | Status: DC | PRN
  Administered 2014-09-18: 19:00:00 via INTRAVENOUS

## 2014-09-18 MED ORDER — MIDAZOLAM HCL 2 MG/2ML IJ SOLN
INTRAMUSCULAR | Status: AC
Start: 2014-09-18 — End: 2014-09-18
  Filled 2014-09-18: qty 2

## 2014-09-18 MED ORDER — BUPIVACAINE HCL (PF) 0.25 % IJ SOLN
INTRAMUSCULAR | Status: AC
  Filled 2014-09-18: qty 30

## 2014-09-18 MED ORDER — FENTANYL CITRATE 0.05 MG/ML IJ SOLN
INTRAMUSCULAR | Status: DC | PRN
  Administered 2014-09-18: 50 ug via INTRAVENOUS
  Administered 2014-09-18: 100 ug via INTRAVENOUS

## 2014-09-18 MED ORDER — DOCUSATE SODIUM 100 MG PO CAPS: 100.0000 mg | ORAL_CAPSULE | Freq: Two times a day (BID) | ORAL | Status: DC

## 2014-09-18 MED ORDER — PROPOFOL 10 MG/ML IV BOLUS
INTRAVENOUS | Status: AC
Start: 2014-09-18 — End: 2014-09-18
  Filled 2014-09-18: qty 20

## 2014-09-18 MED ORDER — CEPHALEXIN 500 MG PO CAPS: 500.0000 mg | ORAL_CAPSULE | Freq: Four times a day (QID) | ORAL | Status: DC

## 2014-09-18 MED ORDER — CHLORHEXIDINE GLUCONATE 4 % EX LIQD: 60.0000 mL | Freq: Once | CUTANEOUS | Status: DC

## 2014-09-18 MED ORDER — LIDOCAINE HCL (PF) 1 % IJ SOLN
INTRAMUSCULAR | Status: AC
Start: 2014-09-18 — End: 2014-09-18
  Filled 2014-09-18: qty 30

## 2014-09-18 MED ORDER — MIDAZOLAM HCL 5 MG/5ML IJ SOLN
INTRAMUSCULAR | Status: DC | PRN
  Administered 2014-09-18: 2 mg via INTRAVENOUS

## 2014-09-18 MED ORDER — FENTANYL CITRATE 0.05 MG/ML IJ SOLN
INTRAMUSCULAR | Status: AC
  Filled 2014-09-18: qty 5

## 2014-09-18 MED ORDER — SUCCINYLCHOLINE CHLORIDE 20 MG/ML IJ SOLN
INTRAMUSCULAR | Status: AC
  Filled 2014-09-18: qty 1

## 2014-09-18 MED ORDER — BUPIVACAINE HCL (PF) 0.25 % IJ SOLN
INTRAMUSCULAR | Status: DC | PRN
  Administered 2014-09-18: 7 mL

## 2014-09-18 MED ORDER — LIDOCAINE HCL 1 % IJ SOLN
INTRAMUSCULAR | Status: DC | PRN
  Administered 2014-09-18: 7 mL

## 2014-09-18 MED ORDER — CEFAZOLIN SODIUM-DEXTROSE 2-3 GM-% IV SOLR
2.0000 g | INTRAVENOUS | Status: AC
  Administered 2014-09-18: 2 g via INTRAVENOUS
  Filled 2014-09-18: qty 50

## 2014-09-18 SURGICAL SUPPLY — 57 items
BANDAGE ELASTIC 3 VELCRO ST LF (GAUZE/BANDAGES/DRESSINGS) ×1 IMPLANT
BANDAGE ELASTIC 4 VELCRO ST LF (GAUZE/BANDAGES/DRESSINGS) ×1 IMPLANT
BNDG CMPR 9X4 STRL LF SNTH (GAUZE/BANDAGES/DRESSINGS) ×1
BNDG COHESIVE 1X5 TAN STRL LF (GAUZE/BANDAGES/DRESSINGS) ×2 IMPLANT
BNDG CONFORM 2 STRL LF (GAUZE/BANDAGES/DRESSINGS) ×2 IMPLANT
BNDG ESMARK 4X9 LF (GAUZE/BANDAGES/DRESSINGS) ×3 IMPLANT
BNDG GAUZE ELAST 4 BULKY (GAUZE/BANDAGES/DRESSINGS) ×1 IMPLANT
CORDS BIPOLAR (ELECTRODE) ×3 IMPLANT
COVER SURGICAL LIGHT HANDLE (MISCELLANEOUS) ×3 IMPLANT
CUFF TOURNIQUET SINGLE 18IN (TOURNIQUET CUFF) ×3 IMPLANT
CUFF TOURNIQUET SINGLE 24IN (TOURNIQUET CUFF) IMPLANT
DRAIN PENROSE 1/4X12 LTX STRL (WOUND CARE) ×2 IMPLANT
DRAPE SURG 17X23 STRL (DRAPES) ×3 IMPLANT
DRSG ADAPTIC 3X8 NADH LF (GAUZE/BANDAGES/DRESSINGS) ×1 IMPLANT
DRSG EMULSION OIL 3X3 NADH (GAUZE/BANDAGES/DRESSINGS) ×2 IMPLANT
ELECT REM PT RETURN 9FT ADLT (ELECTROSURGICAL)
ELECTRODE REM PT RTRN 9FT ADLT (ELECTROSURGICAL) IMPLANT
GAUZE SPONGE 4X4 12PLY STRL (GAUZE/BANDAGES/DRESSINGS) ×3 IMPLANT
GAUZE XEROFORM 1X8 LF (GAUZE/BANDAGES/DRESSINGS) ×1 IMPLANT
GAUZE XEROFORM 5X9 LF (GAUZE/BANDAGES/DRESSINGS) IMPLANT
GLOVE BIOGEL PI IND STRL 8.5 (GLOVE) ×1 IMPLANT
GLOVE BIOGEL PI INDICATOR 8.5 (GLOVE) ×2
GLOVE SURG ORTHO 8.0 STRL STRW (GLOVE) ×3 IMPLANT
GOWN STRL REUS W/ TWL LRG LVL3 (GOWN DISPOSABLE) ×3 IMPLANT
GOWN STRL REUS W/ TWL XL LVL3 (GOWN DISPOSABLE) ×1 IMPLANT
GOWN STRL REUS W/TWL LRG LVL3 (GOWN DISPOSABLE) ×9
GOWN STRL REUS W/TWL XL LVL3 (GOWN DISPOSABLE) ×3
HANDPIECE INTERPULSE COAX TIP (DISPOSABLE)
KIT BASIN OR (CUSTOM PROCEDURE TRAY) ×3 IMPLANT
KIT ROOM TURNOVER OR (KITS) ×3 IMPLANT
MANIFOLD NEPTUNE II (INSTRUMENTS) ×1 IMPLANT
NDL HYPO 25GX1X1/2 BEV (NEEDLE) IMPLANT
NEEDLE HYPO 25GX1X1/2 BEV (NEEDLE) ×3 IMPLANT
NS IRRIG 1000ML POUR BTL (IV SOLUTION) ×3 IMPLANT
PACK ORTHO EXTREMITY (CUSTOM PROCEDURE TRAY) ×3 IMPLANT
PAD ARMBOARD 7.5X6 YLW CONV (MISCELLANEOUS) ×6 IMPLANT
PAD CAST 4YDX4 CTTN HI CHSV (CAST SUPPLIES) ×1 IMPLANT
PADDING CAST COTTON 4X4 STRL (CAST SUPPLIES)
SET HNDPC FAN SPRY TIP SCT (DISPOSABLE) IMPLANT
SOAP 2 % CHG 4 OZ (WOUND CARE) ×3 IMPLANT
SPLINT FINGER (SOFTGOODS) ×2 IMPLANT
SPONGE GAUZE 4X4 12PLY STER LF (GAUZE/BANDAGES/DRESSINGS) ×2 IMPLANT
SPONGE LAP 18X18 X RAY DECT (DISPOSABLE) ×3 IMPLANT
SPONGE LAP 4X18 X RAY DECT (DISPOSABLE) ×1 IMPLANT
SUCTION FRAZIER TIP 10 FR DISP (SUCTIONS) ×3 IMPLANT
SUT ETHILON 4 0 PS 2 18 (SUTURE) IMPLANT
SUT ETHILON 5 0 P 3 18 (SUTURE) ×2
SUT NYLON ETHILON 5-0 P-3 1X18 (SUTURE) ×1 IMPLANT
SYR CONTROL 10ML LL (SYRINGE) ×2 IMPLANT
TOWEL OR 17X24 6PK STRL BLUE (TOWEL DISPOSABLE) ×3 IMPLANT
TOWEL OR 17X26 10 PK STRL BLUE (TOWEL DISPOSABLE) ×3 IMPLANT
TUBE ANAEROBIC SPECIMEN COL (MISCELLANEOUS) IMPLANT
TUBE CONNECTING 12'X1/4 (SUCTIONS) ×1
TUBE CONNECTING 12X1/4 (SUCTIONS) ×2 IMPLANT
UNDERPAD 30X30 INCONTINENT (UNDERPADS AND DIAPERS) ×3 IMPLANT
WATER STERILE IRR 1000ML POUR (IV SOLUTION) ×3 IMPLANT
YANKAUER SUCT BULB TIP NO VENT (SUCTIONS) ×1 IMPLANT

## 2014-09-18 NOTE — Transfer of Care (Signed)
Immediate Anesthesia Transfer of Care Note  Patient: Matthew Zimmerman  Procedure(s) Performed: Procedure(s): IRRIGATION AND DEBRIDEMENT RIGHT THUMB, open fracture repair (Right)  Patient Location: PACU  Anesthesia Type:MAC  Level of Consciousness: awake, alert  and oriented  Airway & Oxygen Therapy: Patient Spontanous Breathing and Patient connected to nasal cannula oxygen  Post-op Assessment: Report given to PACU RN and Post -op Vital signs reviewed and stable  Post vital signs: Reviewed and stable  Complications: No apparent anesthesia complications
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
## 2014-09-18 NOTE — Anesthesia Procedure Notes (Signed)
Procedure Name: MAC Date/Time: 09/18/2014 7:25 PM Performed by: Mosie Epstein Pre-anesthesia Checklist: Patient identified, Emergency Drugs available, Suction available, Patient being monitored and Timeout performed Patient Re-evaluated:Patient Re-evaluated prior to inductionOxygen Delivery Method: Nasal cannula

## 2014-09-18 NOTE — Discharge Instructions (Signed)
KEEP BANDAGE CLEAN AND DRY CALL OFFICE FOR F/U APPT [phone] ON Tuesday 09/22/2014 CALL FOR APPOINTMENT KEEP HAND ELEVATED ABOVE HEART OK TO APPLY ICE TO OPERATIVE AREA CONTACT OFFICE IF ANY WORSENING PAIN OR CONCERNS.

## 2014-09-18 NOTE — Anesthesia Postprocedure Evaluation (Signed)
  Anesthesia Post-op Note  Patient: Matthew Zimmerman  Procedure(s) Performed: Procedure(s): IRRIGATION AND DEBRIDEMENT RIGHT THUMB, open fracture repair (Right)  Patient Location: PACU  Anesthesia Type: MAC  Level of Consciousness: awake and alert   Airway and Oxygen Therapy: Patient Spontanous Breathing  Post-op Pain: none  Post-op Assessment: Post-op Vital signs reviewed, Patient's Cardiovascular Status Stable and Respiratory Function Stable  Post-op Vital Signs: Reviewed  Filed Vitals:   09/18/14 2000  BP: 136/56  Pulse: 75  Temp:   Resp: 19    Complications: No apparent anesthesia complications

## 2014-09-18 NOTE — Brief Op Note (Signed)
09/18/2014  7:09 PM  PATIENT:  Matthew Zimmerman  69 y.o. male  PRE-OPERATIVE DIAGNOSIS:  right thumb fracture  POST-OPERATIVE DIAGNOSIS:  * No post-op diagnosis entered *  PROCEDURE:  Procedure(s): IRRIGATION AND DEBRIDEMENT RIGHT THUMB, PINNING AND REPAIR AS INDICATED (Right)  SURGEON:  Surgeon(s) and Role:    * Linna Hoff, MD - Primary  PHYSICIAN ASSISTANT:   ASSISTANTS: none   ANESTHESIA:   local  EBL:     BLOOD ADMINISTERED:none  DRAINS: none   LOCAL MEDICATIONS USED:  MARCAINE     SPECIMEN:  No Specimen  DISPOSITION OF SPECIMEN:  N/A  COUNTS:  YES  TOURNIQUET:    DICTATION: .Other Dictation: Dictation Number [id number]  PLAN OF CARE: Discharge to home after PACU  PATIENT DISPOSITION:  PACU - hemodynamically stable.   Delay start of Pharmacological VTE agent (>24hrs) due to surgical blood loss or risk of bleeding: not applicable

## 2014-09-18 NOTE — Anesthesia Preprocedure Evaluation (Addendum)
Anesthesia Evaluation  Patient identified by MRN, date of birth, ID band Patient awake    Reviewed: Allergy & Precautions, NPO status , Patient's Chart, lab work & pertinent test results  Airway Mallampati: II  TM Distance: >3 FB Neck ROM: Full    Dental   Pulmonary shortness of breath, former smoker,  H/o PE breath sounds clear to auscultation        Cardiovascular hypertension, Rhythm:Regular Rate:Normal     Neuro/Psych    GI/Hepatic GERD-  ,  Endo/Other  diabetes  Renal/GU      Musculoskeletal   Abdominal   Peds  Hematology   Anesthesia Other Findings   Reproductive/Obstetrics                          Anesthesia Physical Anesthesia Plan  ASA: III  Anesthesia Plan: MAC   Post-op Pain Management:    Induction: Intravenous  Airway Management Planned: Simple Face Mask  Additional Equipment:   Intra-op Plan:   Post-operative Plan:   Informed Consent: I have reviewed the patients History and Physical, chart, labs and discussed the procedure including the risks, benefits and alternatives for the proposed anesthesia with the patient or authorized representative who has indicated his/her understanding and acceptance.     Plan Discussed with: CRNA, Anesthesiologist and Surgeon  Anesthesia Plan Comments:        Anesthesia Quick Evaluation
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
## 2014-09-21 ENCOUNTER — Encounter (HOSPITAL_COMMUNITY): Payer: Self-pay | Admitting: Orthopedic Surgery

## 2014-09-24 DIAGNOSIS — S62521D Displaced fracture of distal phalanx of right thumb, subsequent encounter for fracture with routine healing: Secondary | ICD-10-CM | POA: Diagnosis not present

## 2014-10-08 DIAGNOSIS — Z86711 Personal history of pulmonary embolism: Secondary | ICD-10-CM | POA: Diagnosis not present

## 2014-10-08 DIAGNOSIS — Z7901 Long term (current) use of anticoagulants: Secondary | ICD-10-CM | POA: Diagnosis not present

## 2014-10-09 DIAGNOSIS — Z4789 Encounter for other orthopedic aftercare: Secondary | ICD-10-CM | POA: Diagnosis not present

## 2014-11-03 DIAGNOSIS — Z7901 Long term (current) use of anticoagulants: Secondary | ICD-10-CM | POA: Diagnosis not present

## 2014-11-03 DIAGNOSIS — E118 Type 2 diabetes mellitus with unspecified complications: Secondary | ICD-10-CM | POA: Diagnosis not present

## 2014-11-06 DIAGNOSIS — S62521D Displaced fracture of distal phalanx of right thumb, subsequent encounter for fracture with routine healing: Secondary | ICD-10-CM | POA: Diagnosis not present

## 2014-11-06 DIAGNOSIS — Z4789 Encounter for other orthopedic aftercare: Secondary | ICD-10-CM | POA: Diagnosis not present

## 2014-11-10 DIAGNOSIS — E789 Disorder of lipoprotein metabolism, unspecified: Secondary | ICD-10-CM | POA: Diagnosis not present

## 2014-11-10 DIAGNOSIS — Z86711 Personal history of pulmonary embolism: Secondary | ICD-10-CM | POA: Diagnosis not present

## 2014-11-10 DIAGNOSIS — I2699 Other pulmonary embolism without acute cor pulmonale: Secondary | ICD-10-CM | POA: Diagnosis not present

## 2014-11-10 DIAGNOSIS — E118 Type 2 diabetes mellitus with unspecified complications: Secondary | ICD-10-CM | POA: Diagnosis not present

## 2014-11-26 DIAGNOSIS — E118 Type 2 diabetes mellitus with unspecified complications: Secondary | ICD-10-CM | POA: Diagnosis not present

## 2014-11-26 DIAGNOSIS — Z86711 Personal history of pulmonary embolism: Secondary | ICD-10-CM | POA: Diagnosis not present

## 2015-01-19 DIAGNOSIS — E162 Hypoglycemia, unspecified: Secondary | ICD-10-CM | POA: Diagnosis not present

## 2015-01-19 DIAGNOSIS — E789 Disorder of lipoprotein metabolism, unspecified: Secondary | ICD-10-CM | POA: Diagnosis not present

## 2015-01-19 DIAGNOSIS — Z125 Encounter for screening for malignant neoplasm of prostate: Secondary | ICD-10-CM | POA: Diagnosis not present

## 2015-01-19 DIAGNOSIS — E118 Type 2 diabetes mellitus with unspecified complications: Secondary | ICD-10-CM | POA: Diagnosis not present

## 2015-01-26 DIAGNOSIS — J439 Emphysema, unspecified: Secondary | ICD-10-CM | POA: Diagnosis not present

## 2015-01-26 DIAGNOSIS — E118 Type 2 diabetes mellitus with unspecified complications: Secondary | ICD-10-CM | POA: Diagnosis not present

## 2015-01-26 DIAGNOSIS — E789 Disorder of lipoprotein metabolism, unspecified: Secondary | ICD-10-CM | POA: Diagnosis not present

## 2015-01-26 DIAGNOSIS — I10 Essential (primary) hypertension: Secondary | ICD-10-CM | POA: Diagnosis not present

## 2015-05-27 DIAGNOSIS — E118 Type 2 diabetes mellitus with unspecified complications: Secondary | ICD-10-CM | POA: Diagnosis not present

## 2015-06-01 DIAGNOSIS — Z86711 Personal history of pulmonary embolism: Secondary | ICD-10-CM | POA: Diagnosis not present

## 2015-06-02 DIAGNOSIS — Z86711 Personal history of pulmonary embolism: Secondary | ICD-10-CM | POA: Diagnosis not present

## 2015-06-02 DIAGNOSIS — M549 Dorsalgia, unspecified: Secondary | ICD-10-CM | POA: Diagnosis not present

## 2015-06-02 DIAGNOSIS — E789 Disorder of lipoprotein metabolism, unspecified: Secondary | ICD-10-CM | POA: Diagnosis not present

## 2015-06-02 DIAGNOSIS — E118 Type 2 diabetes mellitus with unspecified complications: Secondary | ICD-10-CM | POA: Diagnosis not present

## 2015-06-02 DIAGNOSIS — Z23 Encounter for immunization: Secondary | ICD-10-CM | POA: Diagnosis not present

## 2015-06-02 DIAGNOSIS — I482 Chronic atrial fibrillation: Secondary | ICD-10-CM | POA: Diagnosis not present

## 2015-06-28 DIAGNOSIS — H903 Sensorineural hearing loss, bilateral: Secondary | ICD-10-CM | POA: Diagnosis not present

## 2015-07-20 IMAGING — CT CT HEAD W/O CM
1 series · 16 of 30 positions shown, 20 images · non-contrast
Comparison: None.

CLINICAL DATA: Transient facial numbness

EXAM:
CT HEAD WITHOUT CONTRAST
TECHNIQUE: Contiguous axial images were obtained from the base of the skull
through the vertex without intravenous contrast.

[Series 2: head 5.0 h30s · axial · 0.39mm/px · z∈[-102,+33]mm · 16 of 30 slices shown, 20 images]
[im 2/30  brain]
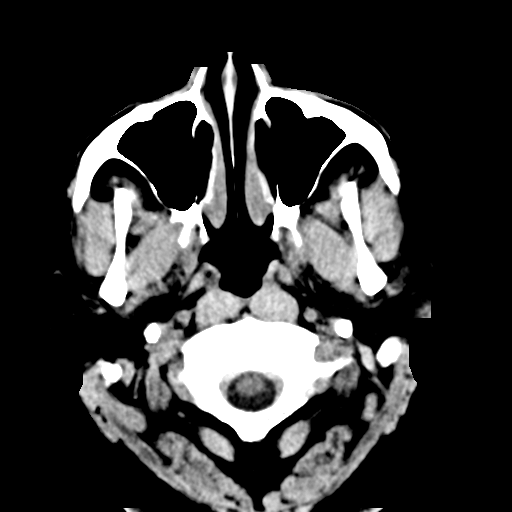
[im 2/30  bone]
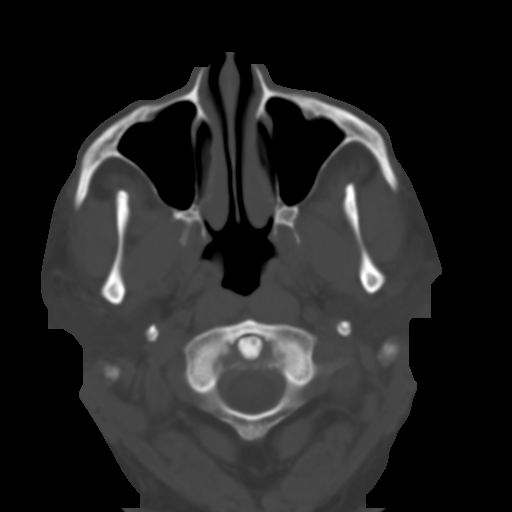
[im 4/30  brain]
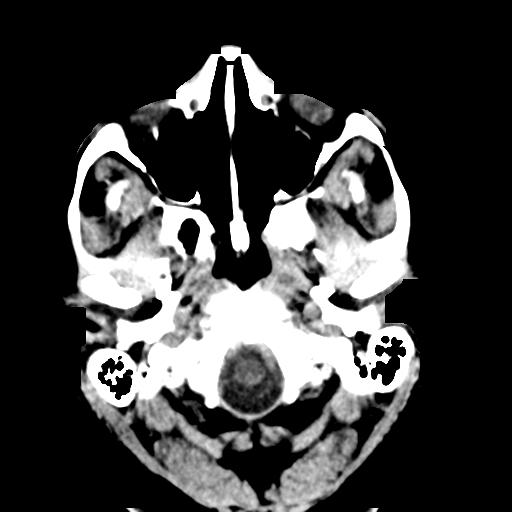
[im 6/30  brain]
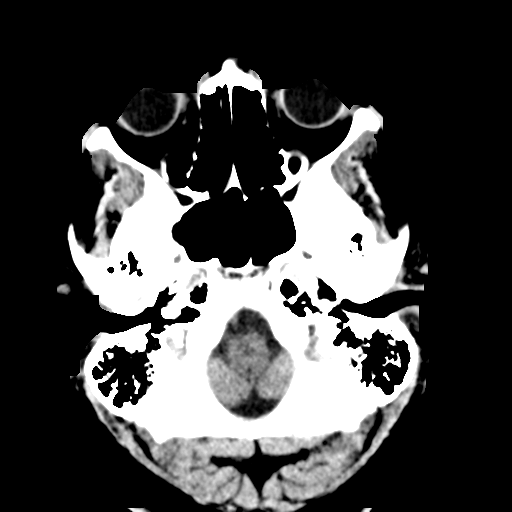
[im 8/30  brain]
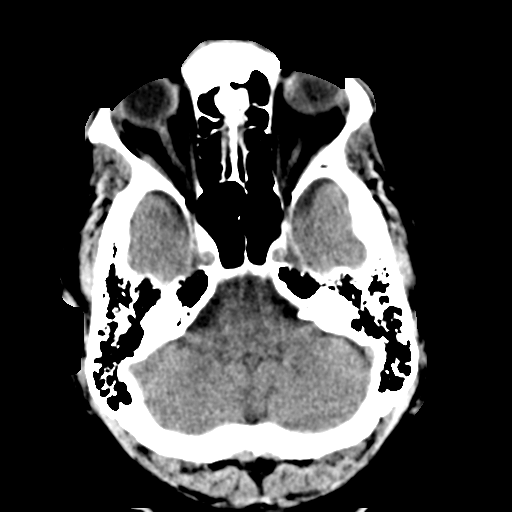
[im 9/30  brain]
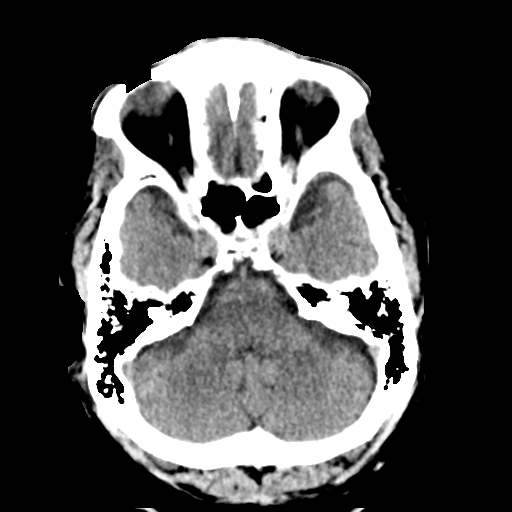
[im 9/30  bone]
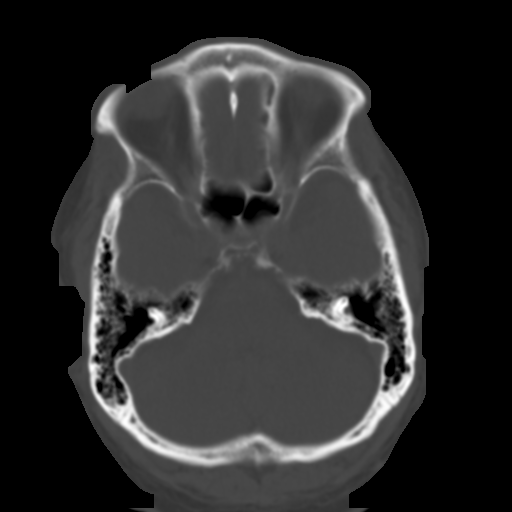
[im 11/30  brain]
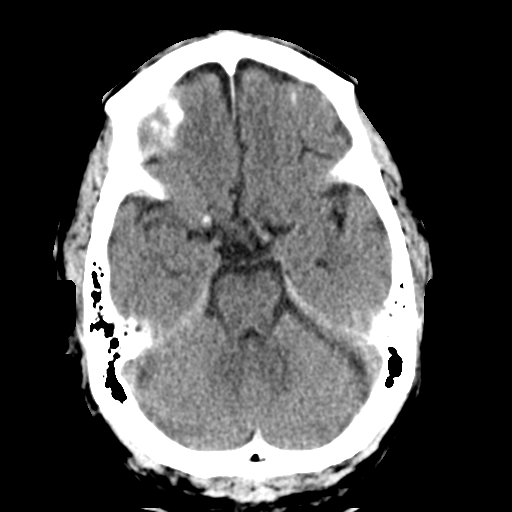
[im 13/30  brain]
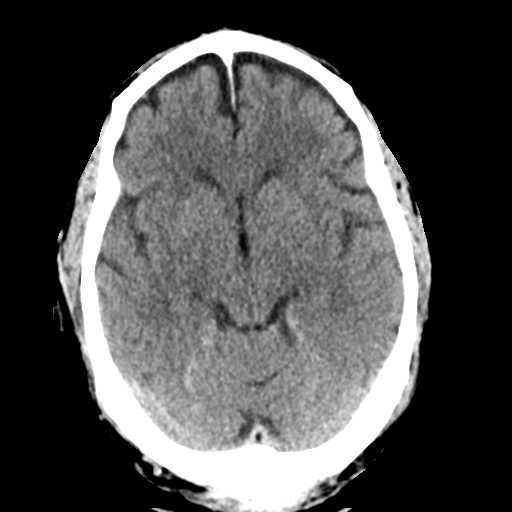
[im 15/30  brain]
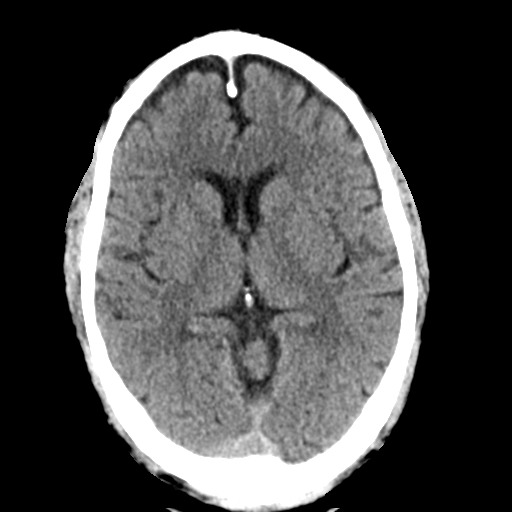
[im 16/30  brain]
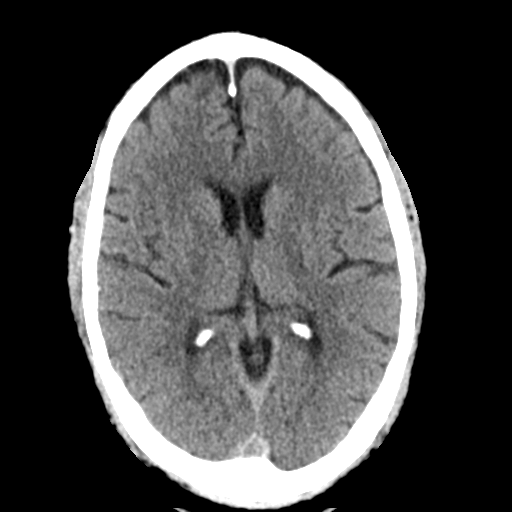
[im 16/30  bone]
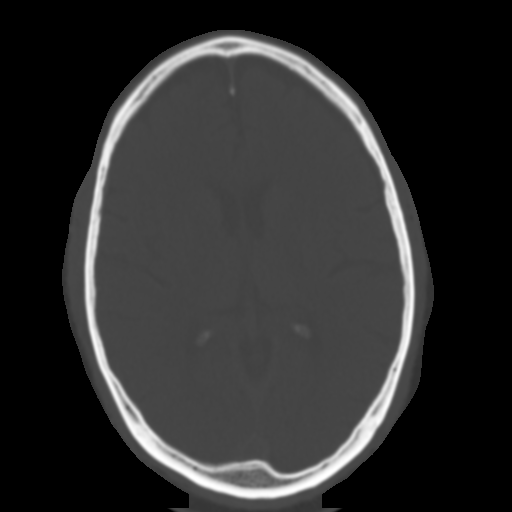
[im 18/30  brain]
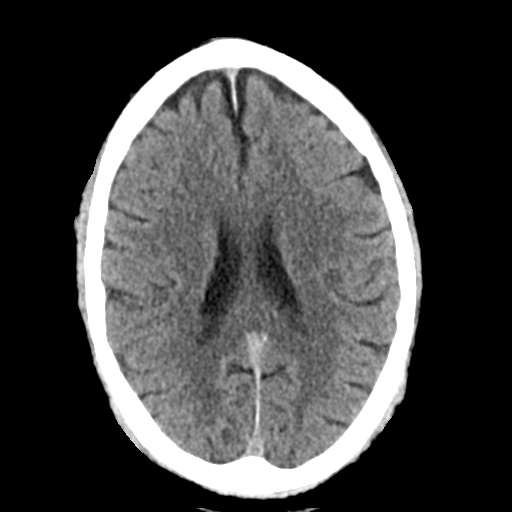
[im 20/30  brain]
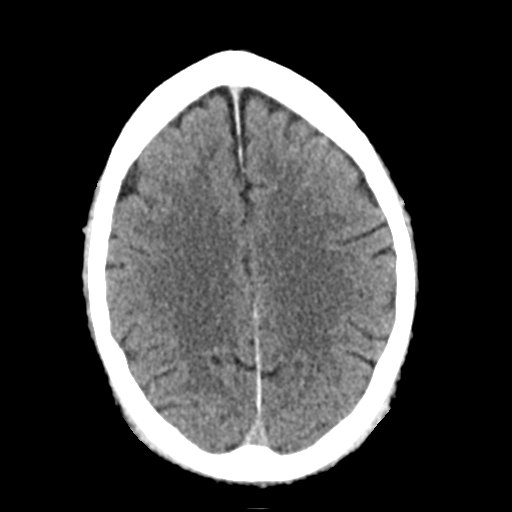
[im 22/30  brain]
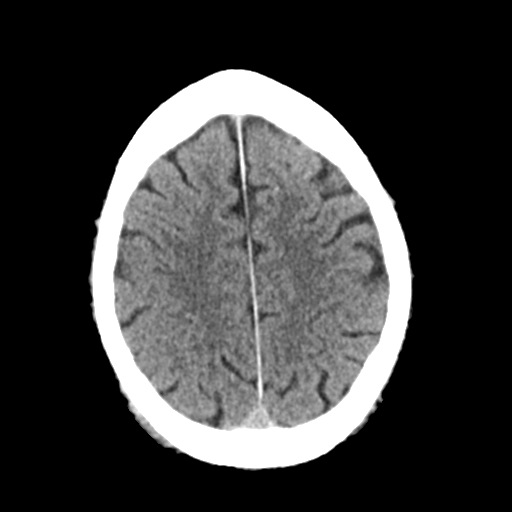
[im 23/30  brain]
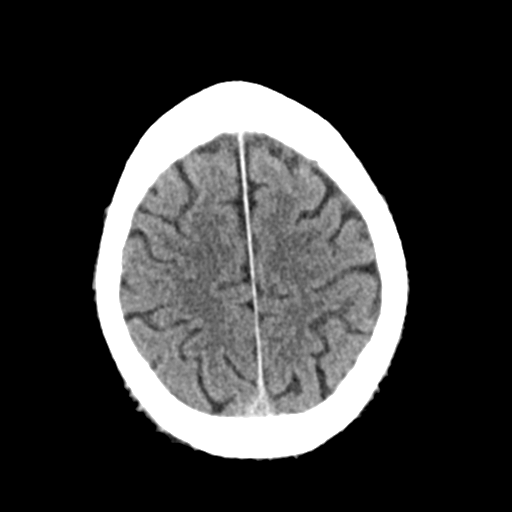
[im 23/30  bone]
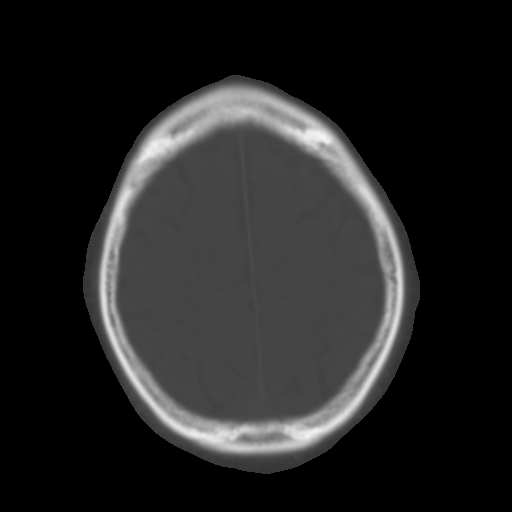
[im 25/30  brain]
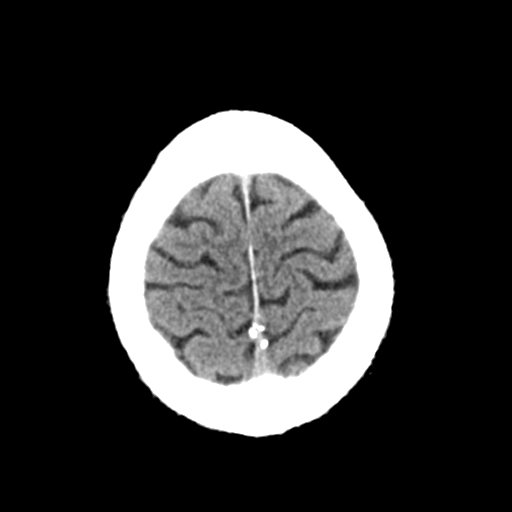
[im 27/30  brain]
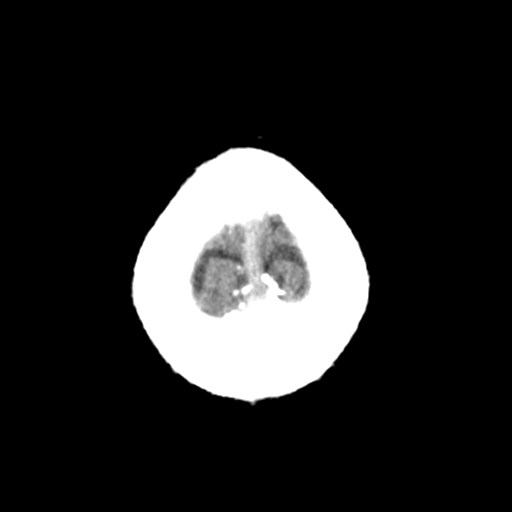
[im 29/30  brain]
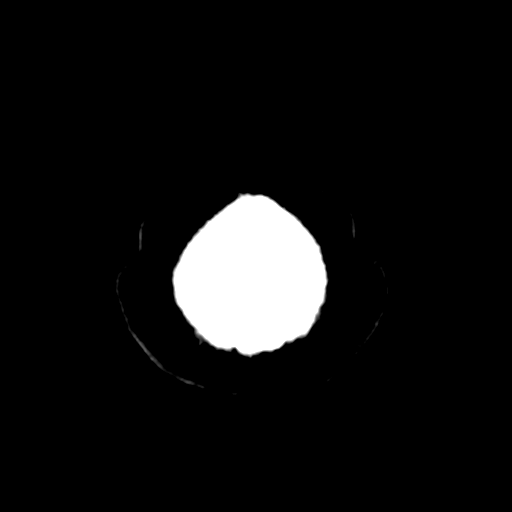

[16 of 30 positions shown; findings below may reference images not displayed]

FINDINGS: There is no evidence of mass effect, midline shift, or extra-axial
fluid collections. There is no evidence of a space-occupying lesion
or intracranial hemorrhage. There is no evidence of a cortical-based
area of acute infarction. There is mild periventricular white matter
low attenuation likely secondary to microangiopathy.

The ventricles and sulci are appropriate for the patient's age. The
basal cisterns are patent.

Visualized portions of the orbits are unremarkable. The visualized
portions of the paranasal sinuses and mastoid air cells are
unremarkable.

The osseous structures are unremarkable.

## 2015-07-26 DIAGNOSIS — Z86711 Personal history of pulmonary embolism: Secondary | ICD-10-CM | POA: Diagnosis not present

## 2015-08-10 DIAGNOSIS — E118 Type 2 diabetes mellitus with unspecified complications: Secondary | ICD-10-CM | POA: Diagnosis not present

## 2015-08-10 DIAGNOSIS — G629 Polyneuropathy, unspecified: Secondary | ICD-10-CM | POA: Diagnosis not present

## 2015-08-10 DIAGNOSIS — M1990 Unspecified osteoarthritis, unspecified site: Secondary | ICD-10-CM | POA: Diagnosis not present

## 2015-08-10 DIAGNOSIS — E789 Disorder of lipoprotein metabolism, unspecified: Secondary | ICD-10-CM | POA: Diagnosis not present

## 2015-10-19 ENCOUNTER — Other Ambulatory Visit: Payer: Self-pay | Admitting: Registered Nurse

## 2015-10-19 ENCOUNTER — Ambulatory Visit
Admission: RE | Admit: 2015-10-19 | Discharge: 2015-10-19 | Disposition: A | Discharge status: Home / Self Care | Payer: Commercial Managed Care - HMO | Source: Ambulatory Visit | Attending: Registered Nurse | Admitting: Registered Nurse

## 2015-10-19 DIAGNOSIS — Z86711 Personal history of pulmonary embolism: Secondary | ICD-10-CM

## 2015-10-19 DIAGNOSIS — R079 Chest pain, unspecified: Secondary | ICD-10-CM

## 2015-10-19 DIAGNOSIS — R0602 Shortness of breath: Secondary | ICD-10-CM | POA: Diagnosis not present

## 2015-10-19 MED ORDER — IOPAMIDOL (ISOVUE-370) INJECTION 76%
100.0000 mL | Freq: Once | INTRAVENOUS | Status: AC | PRN
  Administered 2015-10-19: 100 mL via INTRAVENOUS

## 2015-10-20 DIAGNOSIS — E789 Disorder of lipoprotein metabolism, unspecified: Secondary | ICD-10-CM | POA: Diagnosis not present

## 2015-10-20 DIAGNOSIS — Z86711 Personal history of pulmonary embolism: Secondary | ICD-10-CM | POA: Diagnosis not present

## 2015-10-20 DIAGNOSIS — E118 Type 2 diabetes mellitus with unspecified complications: Secondary | ICD-10-CM | POA: Diagnosis not present

## 2015-11-03 DIAGNOSIS — R0602 Shortness of breath: Secondary | ICD-10-CM | POA: Diagnosis not present

## 2015-11-03 DIAGNOSIS — E118 Type 2 diabetes mellitus with unspecified complications: Secondary | ICD-10-CM | POA: Diagnosis not present

## 2015-11-03 DIAGNOSIS — E789 Disorder of lipoprotein metabolism, unspecified: Secondary | ICD-10-CM | POA: Diagnosis not present

## 2015-11-03 DIAGNOSIS — R0789 Other chest pain: Secondary | ICD-10-CM | POA: Diagnosis not present

## 2015-11-03 DIAGNOSIS — E119 Type 2 diabetes mellitus without complications: Secondary | ICD-10-CM | POA: Diagnosis not present

## 2015-11-03 DIAGNOSIS — R9431 Abnormal electrocardiogram [ECG] [EKG]: Secondary | ICD-10-CM | POA: Diagnosis not present

## 2015-11-10 DIAGNOSIS — E789 Disorder of lipoprotein metabolism, unspecified: Secondary | ICD-10-CM | POA: Diagnosis not present

## 2015-11-10 DIAGNOSIS — E118 Type 2 diabetes mellitus with unspecified complications: Secondary | ICD-10-CM | POA: Diagnosis not present

## 2015-11-10 DIAGNOSIS — R079 Chest pain, unspecified: Secondary | ICD-10-CM | POA: Diagnosis not present

## 2015-11-10 DIAGNOSIS — N289 Disorder of kidney and ureter, unspecified: Secondary | ICD-10-CM | POA: Diagnosis not present

## 2015-11-15 DIAGNOSIS — R0602 Shortness of breath: Secondary | ICD-10-CM | POA: Diagnosis not present

## 2015-11-15 DIAGNOSIS — R0789 Other chest pain: Secondary | ICD-10-CM | POA: Diagnosis not present

## 2015-11-25 DIAGNOSIS — R079 Chest pain, unspecified: Secondary | ICD-10-CM | POA: Diagnosis not present

## 2015-11-25 DIAGNOSIS — R0602 Shortness of breath: Secondary | ICD-10-CM | POA: Diagnosis not present

## 2015-11-29 DIAGNOSIS — Z86711 Personal history of pulmonary embolism: Secondary | ICD-10-CM | POA: Diagnosis not present

## 2015-11-29 DIAGNOSIS — E789 Disorder of lipoprotein metabolism, unspecified: Secondary | ICD-10-CM | POA: Diagnosis not present

## 2015-11-29 DIAGNOSIS — E118 Type 2 diabetes mellitus with unspecified complications: Secondary | ICD-10-CM | POA: Diagnosis not present

## 2015-11-29 DIAGNOSIS — I482 Chronic atrial fibrillation: Secondary | ICD-10-CM | POA: Diagnosis not present

## 2015-12-03 DIAGNOSIS — R0602 Shortness of breath: Secondary | ICD-10-CM | POA: Diagnosis not present

## 2015-12-03 DIAGNOSIS — I2510 Atherosclerotic heart disease of native coronary artery without angina pectoris: Secondary | ICD-10-CM | POA: Diagnosis not present

## 2015-12-03 DIAGNOSIS — R9431 Abnormal electrocardiogram [ECG] [EKG]: Secondary | ICD-10-CM | POA: Diagnosis not present

## 2015-12-03 DIAGNOSIS — R0789 Other chest pain: Secondary | ICD-10-CM | POA: Diagnosis not present

## 2015-12-22 ENCOUNTER — Ambulatory Visit (INDEPENDENT_AMBULATORY_CARE_PROVIDER_SITE_OTHER): Payer: Commercial Managed Care - HMO | Admitting: Neurology

## 2015-12-22 ENCOUNTER — Encounter: Payer: Self-pay | Admitting: Neurology

## 2015-12-22 VITALS — BP 126/64 | HR 64 | Resp 20 | Ht 68.5 in | Wt 190.0 lb

## 2015-12-22 DIAGNOSIS — I25110 Atherosclerotic heart disease of native coronary artery with unstable angina pectoris: Secondary | ICD-10-CM

## 2015-12-22 DIAGNOSIS — R0602 Shortness of breath: Secondary | ICD-10-CM | POA: Diagnosis not present

## 2015-12-22 DIAGNOSIS — E1310 Other specified diabetes mellitus with ketoacidosis without coma: Secondary | ICD-10-CM | POA: Diagnosis not present

## 2015-12-22 DIAGNOSIS — E1110 Type 2 diabetes mellitus with ketoacidosis without coma: Secondary | ICD-10-CM

## 2015-12-22 DIAGNOSIS — Z794 Long term (current) use of insulin: Secondary | ICD-10-CM | POA: Diagnosis not present

## 2015-12-22 DIAGNOSIS — I2782 Chronic pulmonary embolism: Secondary | ICD-10-CM

## 2015-12-22 DIAGNOSIS — R0683 Snoring: Secondary | ICD-10-CM
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
## 2015-12-22 NOTE — Patient Instructions (Signed)

## 2016-01-17 ENCOUNTER — Ambulatory Visit (INDEPENDENT_AMBULATORY_CARE_PROVIDER_SITE_OTHER): Payer: Commercial Managed Care - HMO | Admitting: Neurology

## 2016-01-17 DIAGNOSIS — G4710 Hypersomnia, unspecified: Secondary | ICD-10-CM

## 2016-01-17 DIAGNOSIS — I2782 Chronic pulmonary embolism: Secondary | ICD-10-CM

## 2016-01-17 DIAGNOSIS — I25110 Atherosclerotic heart disease of native coronary artery with unstable angina pectoris: Secondary | ICD-10-CM

## 2016-01-17 DIAGNOSIS — Z794 Long term (current) use of insulin: Secondary | ICD-10-CM

## 2016-01-17 DIAGNOSIS — E1110 Type 2 diabetes mellitus with ketoacidosis without coma: Secondary | ICD-10-CM

## 2016-01-17 DIAGNOSIS — R0602 Shortness of breath: Secondary | ICD-10-CM

## 2016-01-17 DIAGNOSIS — R0683 Snoring: Secondary | ICD-10-CM

## 2016-01-25 ENCOUNTER — Telehealth: Payer: Self-pay

## 2016-01-25 NOTE — Telephone Encounter (Signed)
I spoke with pt's wife [name] (per [facility]) regarding pt's sleep study results. I advised her that pt's sleep study revealed sleep apnea and treatment is advised. PAP therapy is one option, and pt would need to return for a cpap titration. This is Dr. Edwena Felty preferred treatment option because it address pt's hypoxemia. Other options include avoiding supine sleep and a dental device, but these options do not address hypoxemia. Weight loss also may be entertained. Pt's wife says she is unsure of which option pt will prefer. I offered a follow up visit with Dr. Brett Fairy but pt's wife declined at this time. She says that pt will call me back to discuss options. Pt's wife verbalized understanding of results.

## 2016-01-26 DIAGNOSIS — E789 Disorder of lipoprotein metabolism, unspecified: Secondary | ICD-10-CM | POA: Diagnosis not present

## 2016-01-26 DIAGNOSIS — Z7901 Long term (current) use of anticoagulants: Secondary | ICD-10-CM | POA: Diagnosis not present

## 2016-01-26 DIAGNOSIS — E118 Type 2 diabetes mellitus with unspecified complications: Secondary | ICD-10-CM | POA: Diagnosis not present

## 2016-01-26 DIAGNOSIS — Z125 Encounter for screening for malignant neoplasm of prostate: Secondary | ICD-10-CM | POA: Diagnosis not present

## 2016-01-26 DIAGNOSIS — E162 Hypoglycemia, unspecified: Secondary | ICD-10-CM | POA: Diagnosis not present

## 2016-02-02 DIAGNOSIS — I2510 Atherosclerotic heart disease of native coronary artery without angina pectoris: Secondary | ICD-10-CM | POA: Diagnosis not present

## 2016-02-02 DIAGNOSIS — E789 Disorder of lipoprotein metabolism, unspecified: Secondary | ICD-10-CM | POA: Diagnosis not present

## 2016-02-02 DIAGNOSIS — E118 Type 2 diabetes mellitus with unspecified complications: Secondary | ICD-10-CM | POA: Diagnosis not present

## 2016-03-14 DIAGNOSIS — H3562 Retinal hemorrhage, left eye: Secondary | ICD-10-CM | POA: Diagnosis not present

## 2016-03-14 DIAGNOSIS — E113493 Type 2 diabetes mellitus with severe nonproliferative diabetic retinopathy without macular edema, bilateral: Secondary | ICD-10-CM | POA: Diagnosis not present

## 2016-03-14 DIAGNOSIS — H3561 Retinal hemorrhage, right eye: Secondary | ICD-10-CM | POA: Diagnosis not present

## 2016-03-14 DIAGNOSIS — E113412 Type 2 diabetes mellitus with severe nonproliferative diabetic retinopathy with macular edema, left eye: Secondary | ICD-10-CM | POA: Diagnosis not present

## 2016-03-14 DIAGNOSIS — H359 Unspecified retinal disorder: Secondary | ICD-10-CM | POA: Diagnosis not present

## 2016-03-16 DIAGNOSIS — H903 Sensorineural hearing loss, bilateral: Secondary | ICD-10-CM | POA: Diagnosis not present

## 2016-03-17 DIAGNOSIS — R0789 Other chest pain: Secondary | ICD-10-CM | POA: Diagnosis not present

## 2016-03-17 DIAGNOSIS — R0602 Shortness of breath: Secondary | ICD-10-CM | POA: Diagnosis not present

## 2016-03-17 DIAGNOSIS — R9431 Abnormal electrocardiogram [ECG] [EKG]: Secondary | ICD-10-CM | POA: Diagnosis not present

## 2016-03-17 DIAGNOSIS — I2510 Atherosclerotic heart disease of native coronary artery without angina pectoris: Secondary | ICD-10-CM | POA: Diagnosis not present

## 2016-03-28 DIAGNOSIS — Z86010 Personal history of colonic polyps: Secondary | ICD-10-CM | POA: Diagnosis not present

## 2016-03-28 DIAGNOSIS — Z7984 Long term (current) use of oral hypoglycemic drugs: Secondary | ICD-10-CM | POA: Diagnosis not present

## 2016-03-28 DIAGNOSIS — E119 Type 2 diabetes mellitus without complications: Secondary | ICD-10-CM | POA: Diagnosis not present

## 2016-04-03 ENCOUNTER — Encounter: Payer: Self-pay | Admitting: Neurology

## 2016-04-03 ENCOUNTER — Ambulatory Visit (INDEPENDENT_AMBULATORY_CARE_PROVIDER_SITE_OTHER): Payer: Commercial Managed Care - HMO | Admitting: Neurology

## 2016-04-03 VITALS — BP 128/78 | HR 66 | Resp 20 | Ht 68.5 in | Wt 185.0 lb

## 2016-04-03 DIAGNOSIS — G4733 Obstructive sleep apnea (adult) (pediatric): Secondary | ICD-10-CM | POA: Diagnosis not present

## 2016-04-03 NOTE — Progress Notes (Signed)
SLEEP MED[facility]INE CLINIC   Provider:  Larey Seat, M D  Referring Provider:  Dr Einar Gip,  Primary Care Physician:  Gareth Eagle, MD   Chief Complaint  Patient presents with  . Follow-up    discuss sleep study results    HPI:  Matthew Zimmerman is a 70 y.o. male , seen here as a referral  from Dr. Irven Shelling office, NP Neldon Labella, For a sleep consultation. Matthew Zimmerman is a 70 year old male accompanied by his wife during the visit, who has history of chest pain often just seconds at a time and a history of diabetes and hyperlipidemia. He suffered a pulmonary embolism in 2014 but requires at this time no further anticoagulation. His chest pain occurs randomly throughout the day and a chest CT revealed the presence of coronary arthrosclerosis, localized to the left ADD. He is markedly sedentary and exertional symptoms cannot be expired. He does often have shortness of breath, he is using insulin for the control of his diabetes, he had a CT a chest scan on the 14th of every 2017 which revealed calcification of the coronary artery. His wife reported today that she hears her husband snoring but she does not feel that she has observed to apnea or cessation of breathing. He is diabetic and once went into a diabetic coma when his breathing was very labored and sounded different from usual. Her husband's main concern is that he is very fatigued, easily short of breath and since he is a snorer may suffer from sleep apnea. Please also see the review of systems below.    Sleep habits are as follows: Matthew Zimmerman states that he is not able to exert himself and can longer walk long distances. He can go to bed and sleep anytime of the day as confirmed by his wife, who shares his bed room. Usually he falls asleep watching TV. He does not have a set bedtime. He may go to bed as early as 8 or 9 PM except on Mondays and he likes to watch wrestling on TV. His wife reports that he falls asleep during the  sports show. He can fall asleep and stay asleep until 4 AM when he usually has one bathroom break. He easily can go back to sleep after that.  He reports getting diaphoretic at night, inspite of the bedroom being cool, quiet and dark. He prefers to sleep on his side, sometimes even prone. He sleeps on one pillow. His bed his adjustable but he choses not to elevate the head of bed. His wife has noticed him to snore as I described above but she's not sure that she has witnessed apneas. He wakes up usually shortly before 6 AM and rises at that time. He will get their foster children ready for school and their day program. He often goes back to bed after he sent them of. Matthew Zimmerman often falls asleep in daytime to whenever he is not physically active or mentally stimulated. He is usually even asleep before lunch. Over 24 hour period he may easily sleep 10-12 hours. He reports vivid dreams, but denies sleep paralysis or dream intrusion. He denies any cataplectic attacks. Matthew Zimmerman has significant risk factors for nocturnal hypoxemia, hypercapnia due to his history of pulmonary embolism as well as coronary artery disease. I am not sure if we will find obstructive sleep apnea, but the patient has been witnessed to snore and he does wake up sometimes with diaphoresis or palpitations and is often short of breath.  This can be supporting evidence. Concerning is his excessive daytime sleepiness which he underestimated at an 11 point rating on the Epworth sleepiness score. He sleeps almost 12 hours daily and still is not refreshed or restored.  Whenever mentally not stimulated and physically not active he can sleep pretty much anytime of the day. He does not show signs of narcolepsy otherwise, as he denied dream intrusions, cataplexy, sleep paralysis. So an evaluation in a split-night polysomnography with capnography is indicated. I will also advise the attending sleep technologist to use oxygen should he be  hypoxemic without apnea with an insignificant degree of apnea. I'm looking forward to follow with [name] after the sleep study is completed and reviewed.     I had the pleasure of seeing Matthew Zimmerman today and her revisit on 04/03/2016 he underwent a nocturnal polysomnography dated 01/17/2016. The study was very interesting while it showed hypoxemia without evidence of hypercapnia his AHI was only 13.9 which is a mild apnea altogether. During REM sleep his AHI increased to 25 and in conjunction with hypoxemia I would normally recommend CPAP. However the patient had a clear supine accentuation of his apnea index and did much better in nonsupine position either prone or on his side. Nonsupine AHI was 1.1. I therefore told him my recommendation would be for him to avoid supine sleep, work on weight loss. And his snoring has been also strictly positional. He is not known to snore when not supine. For this reason I will not order a dental device ,either. The patient  Doesn't want CPAP but listened to the [employer]..   Sleep medical history and family  history: no sleep walking, no night terrors.    Social history: married, raises foster children ( 2 boys) ,  2 adult biological children.  The patient is a very remote history of smoking, he quits drinking alcohol at the same time. Caffeine use in form of iced teas and soda, 2 a day.  Review of Systems: Out of a complete 14 system review, the patient complains of only the following symptoms, and all other reviewed systems are negative. The patient endorsed snoring, fatigue, exercise intolerance, hearing loss, ringing in his ears, daytime excessive sleepiness, the feeling of not getting ever enough sleep, loss of energy.  The geriatric depression score was endorsed at 3 points, fatigue severity score at 41, Epworth sleepiness score at 11. I have a strong suspicion that Matthew Zimmerman would have graded this  higher.     Social History   Social History  . Marital status: Married    Spouse name: N/A  . Number of children: N/A  . Years of education: N/A   Occupational History  . Not on file.   Social History Main Topics  . Smoking status: Former Research scientist (life sciences)  . Smokeless tobacco: Former Systems developer  . Alcohol use No  . Drug use: No  . Sexual activity: Not on file   Other Topics Concern  . Not on file   Social History Narrative  . No narrative on file    Family History  Problem Relation Age of Onset  . Cancer Mother   . Diabetes Father     Past Medical History:  Diagnosis Date  . Carpal tunnel syndrome on both sides   . Chest pain   . Diabetes mellitus without complication ([place])   . GERD (gastroesophageal reflux disease)   . Headache   . Hypercholesteremia   . Hyperlipidemia   . PE (  pulmonary embolism)   . Shortness of breath dyspnea    PE     Past Surgical History:  Procedure Laterality Date  . BILATERAL CARPAL TUNNEL RELEASE Bilateral   . COLONOSCOPY    . ELBOW SURGERY     BONE SPURS REMOVED B ELBOWS  . EYE SURGERY Bilateral    cataracts  . FOOT SURGERY     HEEL SPURS REMOVED B HEELS  . I&D EXTREMITY Right 09/18/2014   Procedure: IRRIGATION AND DEBRIDEMENT RIGHT THUMB, open fracture repair;  Surgeon: Linna Hoff, MD;  Location: [facility];  Service: Orthopedics;  Laterality: Right;  . LEG SURGERY Left    left thigh table saw accident, "cut to the bone", I&D/suturing  . SHOULDER SURGERY Left    Bone spur  . SPINE SURGERY  09/04/1985   lumbar spine  . TOE SURGERY      Current Outpatient Prescriptions  Medication Sig Dispense Refill  . diclofenac sodium (VOLTAREN) 1 % GEL Apply 1 application topically 4 (four) times daily as needed (pain).    [name] empagliflozin (JARDIANCE) 25 MG TABS tablet Take 25 mg by mouth daily.    . insulin NPH-regular Human (NOVOLIN 70/30) (70-30) 100 UNIT/ML injection Inject 25-35 Units into the skin 2 (two) times daily with a meal. Humulin    .  lisinopril (PRINIVIL,ZESTRIL) 10 MG tablet Take 10 mg by mouth daily.    . metFORMIN (GLUCOPHAGE-XR) 500 MG 24 hr tablet Take 1,000 mg by mouth 2 (two) times daily.   0  . metoprolol tartrate (LOPRESSOR) 25 MG tablet Take 25 mg by mouth 2 (two) times daily.    . sitaGLIPtin (JANUVIA) 100 MG tablet Take 100 mg by mouth daily.     No current facility-administered medications for this visit.     Allergies as of 04/03/2016 - Review Complete 04/03/2016  Allergen Reaction Noted  . Cortisone Other (See Comments)     Vitals: BP 128/78   Pulse 66   Resp 20   Ht 5' 8.5" (1.74 m)   Wt 185 lb (83.9 kg)   BMI 27.72 kg/m  Last Weight:  Wt Readings from Last 1 Encounters:  04/03/16 185 lb (83.9 kg)   [id number] mass index is 27.72 kg/m.     Last Height:   Ht Readings from Last 1 Encounters:  04/03/16 5' 8.5" (1.74 m)    Physical exam:  General: The patient is awake, alert and appears not in acute distress. The patient is well groomed. Head: Normocephalic, atraumatic. Neck is supple. Mallampati 4 , full dentures.  neck circumference:17.25 . Nasal airflow  Allergic rhinitis evident . Retrognathia is seen.  Cardiovascular:  Regular rate and rhythm, without  murmurs or carotid bruit, and without distended neck veins. Respiratory: Lungs are clear to auscultation. Skin:  Without evidence of edema, or rash Trunk: BMI is elevated   Neurologic exam : The patient is awake and alert, oriented to place and time.   Memory subjective described as intact.  Attention span & concentration ability appears normal.  Speech is fluent,  without dysarthria, dysphonia or aphasia.  Mood and affect are appropriate.  Cranial nerves: Pupils are equal and briskly reactive to light.   Facial sensation intact to fine touch.  Facial motor strength is asymmetric,left lower facial droop.  The  tongue and uvula move midline. Shoulder shrug was symmetrical.   Motor exam: Normal tone, muscle bulk and symmetric  strength in all extremities.   The patient was advised of the nature of  the diagnosed sleep disorder , the treatment options and risks for general a health and wellness arising from not treating the condition.  I spent more than 20 minutes of face to face time with the patient. Greater than 50% of time was spent in counseling and coordination of care. We have discussed the diagnosis and differential and I answered the patient's questions.     Dear Dr. Wilson Singer,  Dear Dr. Einar Gip and Neldon Labella, NP   Assessment:  After physical and neurologic examination, review of laboratory studies,  Personal review of  neurophysiology testing and pre-existing records as far as provided in visit., my assessment is   1) mild sleep apnea, but hypoxemia is present. Hypercapnia could not be identified. The patient's apnea is REM accentuated. This would speak for CPAP use but the patient does not favor this option, and reports that he barely ever sleeps supine when in his own bed at home. Since his nonsupine AHI was 1.1 I would recommend just strictly positional therapy,  I explained the tennis ball is an 8 to keep him from inadvertently sleeping supine and I recommend to work on weight loss.    Plan:  Treatment plan and additional workup :  PRN , if hypersomnia returns or when there is evidence of witnessed apnea.   Tennisball.  Asencion Partridge Tylasia Fletchall MD  04/03/2016

## 2016-04-03 NOTE — Patient Instructions (Signed)
Hypersomnia Hypersomnia is when you feel extremely tired during the day even though you're getting plenty of sleep at night. You may need to take naps during the day, and you may also be extremely difficult to wake up when you are sleeping.  CAUSES  The cause of your hypersomnia may not be known. Hypersomnia may be caused by:   Medicines.  Sleep disorders, such as narcolepsy.  Trauma or injury to your head or nervous system.  Using drugs or alcohol.  Tumors.  Medical conditions, such as depression or hypothyroidism.  Genetics. SIGNS AND SYMPTOMS  The main symptoms of hypersomnia include:   Feeling extremely tired throughout the day.  Being very difficult to wake up.  Sleeping for longer and longer periods.  Taking naps throughout the day. Other symptoms may include:   Feeling:  Restless.  Annoyed.  Anxious.  Low energy.  Having difficulty:  Remembering.  Speaking.  Thinking.  Losing your appetite.  Experiencing hallucinations. DIAGNOSIS  Hypersomnia may be diagnosed by:  Medical history and physical exam. This will include a sleep history.  Completing sleep logs.  Tests may also be done, such as:  Polysomnography.  Multiple sleep latency test (MSLT). TREATMENT  There is no cure for hypersomnia, but treatment can be very effective in helping manage the condition. Treatment may include:  Lifestyle and sleeping strategies to help cope with the condition.  Stimulant medicines.  Treating any underlying causes of hypersomnia. HOME CARE INSTRUCTIONS  Take medicines only as directed by your health care provider.  Schedule short naps for when you feel sleepiest during the day. Tell your employer or teachers that you have hypersomnia. You may be able to adjust your schedule to include time for naps.  Avoid drinking alcohol or caffeinated beverages.  Do not eat a heavy meal before bedtime. Eat at about the same times every day.  Do not drive or  operate heavy machinery if you are sleepy.  Do not swim or go out on the water without a life jacket.  If possible, adjust your schedule so that you do not have to work or be active at night.  Keep all follow-up visits as directed by your health care provider. This is important. SEEK MEDICAL CARE IF:   You have new symptoms.  Your symptoms get worse. SEEK IMMEDIATE MEDICAL CARE IF:  You have serious thoughts of hurting yourself or someone else.   This information is not intended to replace advice given to you by your health care provider. Make sure you discuss any questions you have with your health care provider.   Document Released: 08/11/2002 Document Revised: 09/11/2014 Document Reviewed: 03/26/2014 Elsevier Interactive Patient Education 2016 Elsevier Inc.  

## 2016-04-04 ENCOUNTER — Other Ambulatory Visit: Payer: Self-pay | Admitting: Gastroenterology

## 2016-04-04 DIAGNOSIS — Z86010 Personal history of colonic polyps: Secondary | ICD-10-CM | POA: Diagnosis not present

## 2016-04-04 DIAGNOSIS — D123 Benign neoplasm of transverse colon: Secondary | ICD-10-CM | POA: Diagnosis not present

## 2016-04-04 DIAGNOSIS — K6389 Other specified diseases of intestine: Secondary | ICD-10-CM | POA: Diagnosis not present

## 2016-04-04 DIAGNOSIS — D126 Benign neoplasm of colon, unspecified: Secondary | ICD-10-CM | POA: Diagnosis not present

## 2016-04-04 DIAGNOSIS — D122 Benign neoplasm of ascending colon: Secondary | ICD-10-CM | POA: Diagnosis not present

## 2016-04-10 DIAGNOSIS — E113412 Type 2 diabetes mellitus with severe nonproliferative diabetic retinopathy with macular edema, left eye: Secondary | ICD-10-CM | POA: Diagnosis not present

## 2016-06-09 DIAGNOSIS — E789 Disorder of lipoprotein metabolism, unspecified: Secondary | ICD-10-CM | POA: Diagnosis not present

## 2016-06-09 DIAGNOSIS — E118 Type 2 diabetes mellitus with unspecified complications: Secondary | ICD-10-CM | POA: Diagnosis not present

## 2016-06-14 DIAGNOSIS — R079 Chest pain, unspecified: Secondary | ICD-10-CM | POA: Diagnosis not present

## 2016-06-14 DIAGNOSIS — I10 Essential (primary) hypertension: Secondary | ICD-10-CM | POA: Diagnosis not present

## 2016-06-14 DIAGNOSIS — Z23 Encounter for immunization: Secondary | ICD-10-CM | POA: Diagnosis not present

## 2016-06-14 DIAGNOSIS — E118 Type 2 diabetes mellitus with unspecified complications: Secondary | ICD-10-CM | POA: Diagnosis not present

## 2016-07-03 ENCOUNTER — Ambulatory Visit (INDEPENDENT_AMBULATORY_CARE_PROVIDER_SITE_OTHER): Payer: Commercial Managed Care - HMO | Admitting: Otolaryngology

## 2016-07-11 DIAGNOSIS — H903 Sensorineural hearing loss, bilateral: Secondary | ICD-10-CM | POA: Diagnosis not present

## 2016-07-12 DIAGNOSIS — I2510 Atherosclerotic heart disease of native coronary artery without angina pectoris: Secondary | ICD-10-CM | POA: Diagnosis not present

## 2016-07-12 DIAGNOSIS — R0789 Other chest pain: Secondary | ICD-10-CM | POA: Diagnosis not present

## 2016-07-12 DIAGNOSIS — R9431 Abnormal electrocardiogram [ECG] [EKG]: Secondary | ICD-10-CM | POA: Diagnosis not present

## 2016-07-12 DIAGNOSIS — R0602 Shortness of breath: Secondary | ICD-10-CM | POA: Diagnosis not present

## 2016-08-14 DIAGNOSIS — H359 Unspecified retinal disorder: Secondary | ICD-10-CM | POA: Diagnosis not present

## 2016-08-14 DIAGNOSIS — E113491 Type 2 diabetes mellitus with severe nonproliferative diabetic retinopathy without macular edema, right eye: Secondary | ICD-10-CM | POA: Diagnosis not present

## 2016-08-14 DIAGNOSIS — E113412 Type 2 diabetes mellitus with severe nonproliferative diabetic retinopathy with macular edema, left eye: Secondary | ICD-10-CM | POA: Diagnosis not present

## 2016-08-14 DIAGNOSIS — H3562 Retinal hemorrhage, left eye: Secondary | ICD-10-CM | POA: Diagnosis not present

## 2016-08-21 DIAGNOSIS — E113412 Type 2 diabetes mellitus with severe nonproliferative diabetic retinopathy with macular edema, left eye: Secondary | ICD-10-CM | POA: Diagnosis not present

## 2016-08-24 DIAGNOSIS — R109 Unspecified abdominal pain: Secondary | ICD-10-CM | POA: Diagnosis not present

## 2016-08-24 DIAGNOSIS — E118 Type 2 diabetes mellitus with unspecified complications: Secondary | ICD-10-CM | POA: Diagnosis not present

## 2016-08-24 DIAGNOSIS — R10811 Right upper quadrant abdominal tenderness: Secondary | ICD-10-CM | POA: Diagnosis not present

## 2016-08-24 DIAGNOSIS — M25551 Pain in right hip: Secondary | ICD-10-CM | POA: Diagnosis not present

## 2016-08-24 DIAGNOSIS — I10 Essential (primary) hypertension: Secondary | ICD-10-CM | POA: Diagnosis not present

## 2016-10-02 DIAGNOSIS — E113412 Type 2 diabetes mellitus with severe nonproliferative diabetic retinopathy with macular edema, left eye: Secondary | ICD-10-CM | POA: Diagnosis not present

## 2016-10-19 DIAGNOSIS — R0602 Shortness of breath: Secondary | ICD-10-CM | POA: Diagnosis not present

## 2016-10-19 DIAGNOSIS — R9439 Abnormal result of other cardiovascular function study: Secondary | ICD-10-CM | POA: Diagnosis not present

## 2016-10-19 DIAGNOSIS — R5383 Other fatigue: Secondary | ICD-10-CM | POA: Diagnosis not present

## 2016-10-19 DIAGNOSIS — R0789 Other chest pain: Secondary | ICD-10-CM | POA: Diagnosis not present

## 2016-10-20 DIAGNOSIS — I2510 Atherosclerotic heart disease of native coronary artery without angina pectoris: Secondary | ICD-10-CM | POA: Diagnosis not present

## 2016-10-20 DIAGNOSIS — E7800 Pure hypercholesterolemia, unspecified: Secondary | ICD-10-CM | POA: Diagnosis not present

## 2016-11-13 DIAGNOSIS — E113412 Type 2 diabetes mellitus with severe nonproliferative diabetic retinopathy with macular edema, left eye: Secondary | ICD-10-CM | POA: Diagnosis not present

## 2016-11-16 DIAGNOSIS — I10 Essential (primary) hypertension: Secondary | ICD-10-CM | POA: Diagnosis not present

## 2016-11-16 DIAGNOSIS — R05 Cough: Secondary | ICD-10-CM | POA: Diagnosis not present

## 2016-11-16 DIAGNOSIS — E118 Type 2 diabetes mellitus with unspecified complications: Secondary | ICD-10-CM | POA: Diagnosis not present

## 2016-11-16 DIAGNOSIS — E789 Disorder of lipoprotein metabolism, unspecified: Secondary | ICD-10-CM | POA: Diagnosis not present

## 2016-11-29 DIAGNOSIS — E118 Type 2 diabetes mellitus with unspecified complications: Secondary | ICD-10-CM | POA: Diagnosis not present

## 2016-11-29 DIAGNOSIS — G629 Polyneuropathy, unspecified: Secondary | ICD-10-CM | POA: Diagnosis not present

## 2016-11-29 DIAGNOSIS — I10 Essential (primary) hypertension: Secondary | ICD-10-CM | POA: Diagnosis not present

## 2016-12-28 ENCOUNTER — Ambulatory Visit (INDEPENDENT_AMBULATORY_CARE_PROVIDER_SITE_OTHER): Payer: Medicare HMO | Admitting: Neurology

## 2016-12-28 ENCOUNTER — Ambulatory Visit (INDEPENDENT_AMBULATORY_CARE_PROVIDER_SITE_OTHER): Payer: Self-pay | Admitting: Neurology

## 2016-12-28 DIAGNOSIS — G5600 Carpal tunnel syndrome, unspecified upper limb: Secondary | ICD-10-CM

## 2016-12-28 DIAGNOSIS — G5601 Carpal tunnel syndrome, right upper limb: Secondary | ICD-10-CM

## 2016-12-28 DIAGNOSIS — Z0289 Encounter for other administrative examinations: Secondary | ICD-10-CM

## 2016-12-28 DIAGNOSIS — G629 Polyneuropathy, unspecified: Secondary | ICD-10-CM

## 2016-12-28 NOTE — Progress Notes (Addendum)
Full Name: Matthew Zimmerman Gender: Male MRN #: [id number] Date of Birth: 1946/03/21    Visit Date: 12/28/2016 10:37 Age: 71 Years 37 Months Old Examining Physician: Sarina Ill, MD  Referring Physician: Dr. Wilson Singer    History: Patient is here as a referral from Dr. Wilson Singer for evaluation of carpal tunnel syndrome and numbness of the right hand. Past medical history of diabetes and bilateral carpal tunnel syndrome.   Summary: EMG nerve conduction study was performed on the bilateral upper extremities.  The right median motor nerve showed prolonged distal onset latency (5.2 ms, N<4.4)  and reduced amplitude(3.1mV, N>4)..The left median motor nerve showed prolonged distal onset latency (4.8 ms, N<4.4). The right ulnar motor nerve showed prolonged distal onset latency (3.8 ms, N<3.3)  and reduced amplitude(4.108mV, N>6).The right Radial orthodromic sensory nerve showed prolonged distal peak latency (3.1 ms, N<2.9).and reduced amplitude(4uV, N>15)..The right Median 2nd Digit orthodromic sensory nerve showed prolonged distal peak latency (3.5 ms, N<3.4).and reduced amplitude(6uV, N>10)..The left Median 2nd Digit orthodromic sensory nerve showed prolonged distal peak latency (3.5 ms, N<3.4) and reduced amplitude(7uV, N>10).The right Ulnar 5th Digit orthodromic sensory nerve showed prolonged distal peak latency (3.2 ms, N<3.1) and reduced amplitude(2uV, N>5). The right Ulnar 5th Digit orthodromic sensory nerve showed prolonged distal peak latency (3.38ms, N<3.1) and reduced amplitude(3uV, N>5).The right Median/Radial (thumb) comparison nerve showed abnormal peak latency difference (Median Thumb-Radial Thumb, 0.7 ms, N<0.5) with a relative median delay.   All muscles and remaining nerves (as detailed in the following tables) were within normal limits.  Conclusion: There is electrophysiologic evidence of sensorimotor polyneuropathy. Given the widespread polyneuropathy it is difficult to ascertain if  there is concomitant carpal tunnel syndrome. Delayed latencies of the median and ulnar nerves with decreased amplitudes may be due to median or ulnar neuropathy but may also be due to polyneuropathy. Abnormal peak latency difference on right Median/Radial thumb comparison nerve study may signify median neuropathy at the wrist .Clinical correlation suggested.  No indication of cervical radiculopathy.  Cc: Dr. Ronnette Hila, M.D.  [facility] [place], [place] [address] Tel: [phone] Fax: [phone]        [facility]    Nerve / Sites Muscle Latency Ref. Amplitude Ref. Rel Amp Segments Distance Velocity Ref. Area    ms ms mV mV %  cm m/s m/s mVms  R Median - APB     Wrist APB 5.2 ?4.4 3.8 ?4.0 100 Wrist - APB 7   10.5     Upper arm APB 10.4  3.9  103 Upper arm - Wrist 26 50 ?49 9.0  L Median - APB     Wrist APB 4.8 ?4.4 4.2 ?4.0 100 Wrist - APB 7   13.4     Upper arm APB 10.3  3.5  82.7 Upper arm - Wrist 27 49 ?49 11.1  R Ulnar - ADM     Wrist ADM 3.8 ?3.3 4.1 ?6.0 100 Wrist - ADM 7   12.2     B.Elbow ADM 8.1  4.7  114 B.Elbow - Wrist 21 49 ?49 11.5     A.Elbow ADM 10.3  4.4  95.1 A.Elbow - B.Elbow 10 45 ?49 11.0         A.Elbow - Wrist      L Ulnar - ADM     Wrist ADM 3.1 ?3.3 8.1 ?6.0 100 Wrist - ADM 7   25.0     B.Elbow ADM 6.6  7.2  88.7 B.Elbow - Wrist 18 51 ?49 21.3     A.Elbow ADM 9.1  6.7  93.2 A.Elbow - B.Elbow 12 49 ?49 20.7         A.Elbow - Wrist                 SNC    Nerve / Sites Rec. Site Peak Lat Ref.  Amp Ref. Segments Distance Peak Diff Ref.    ms ms V V  cm ms ms  R Radial - Anatomical snuff box (Forearm)     Forearm Wrist 3.1 ?2.9 4 ?15 Forearm - Wrist 10    R Median, Ulnar - Transcarpal comparison     Median Palm Wrist 2.5 ?2.2 10 ?35 Median Palm - Wrist 8       Ulnar Palm Wrist 2.3 ?2.2 3 ?12 Ulnar Palm - Wrist 8          Median Palm - Ulnar Palm  0.2 ?0.4  R Median, Radial - Thumb comparison     Median Wrist Thumb  3.7  7  Median Wrist - Thumb 10       Radial Wrist Thumb 3.0  4  Radial Wrist - Thumb 10          Median Wrist - Radial Wrist  0.7 ?0.5  R Median - Orthodromic (Dig II, Mid palm)     Dig II Wrist 3.5 ?3.4 6 ?10 Dig II - Wrist 13    L Median - Orthodromic (Dig II, Mid palm)     Dig II Wrist 3.5 ?3.4 7 ?10 Dig II - Wrist 13    R Ulnar - Orthodromic, (Dig V, Mid palm)     Dig V Wrist 3.2 ?3.1 2 ?5 Dig V - Wrist 11    L Ulnar - Orthodromic, (Dig V, Mid palm)     Dig V Wrist 3.5 ?3.1 3 ?5 Dig V - Wrist 36                      F  Wave    Nerve F Lat Ref.   ms ms  R Ulnar - ADM 33.7 ?32.0  L Ulnar - ADM 33.6 ?32.0         EMG full       EMG Summary Table    Spontaneous MUAP Recruitment  Muscle IA Fib PSW Fasc Other Amp Dur. Poly Pattern  R. Deltoid Normal None None None _______ Normal Normal Normal Normal  R. Trapezius Normal None None None _______ Normal Normal Normal Normal  R. Pronator teres Normal None None None _______ Normal Normal Normal Normal  R. First dorsal interosseous Normal None None None _______ Normal Normal Normal Normal  R. Opponens pollicis Normal None None None _______ Normal Normal Normal Normal  R. Cervical paraspinals (low) Normal None None None _______ Normal Normal Normal Normal

## 2016-12-30 NOTE — Progress Notes (Signed)
See procedure note.

## 2017-01-01 NOTE — Procedures (Signed)
Full Name: Matthew Zimmerman Gender: Male MRN #: [id number] Date of Birth: March 02, 1946    Visit Date: 12/28/2016 10:37 Age: 71 Years 69 Months Old Examining Physician: Sarina Ill, MD  Referring Physician: Dr. Wilson Singer    History: Patient is here as a referral from Dr. Wilson Singer for evaluation of carpal tunnel syndrome and numbness of the right hand. Past medical history of diabetes and bilateral carpal tunnel syndrome.   Summary: EMG nerve conduction study was performed on the bilateral upper extremities.  The right median motor nerve showed prolonged distal onset latency (5.2 ms, N<4.4)  and reduced amplitude(3.10mV, N>4)..The left median motor nerve showed prolonged distal onset latency (4.8 ms, N<4.4). The right ulnar motor nerve showed prolonged distal onset latency (3.8 ms, N<3.3)  and reduced amplitude(4.81mV, N>6).The right Radial orthodromic sensory nerve showed prolonged distal peak latency (3.1 ms, N<2.9).and reduced amplitude(4uV, N>15)..The right Median 2nd Digit orthodromic sensory nerve showed prolonged distal peak latency (3.5 ms, N<3.4).and reduced amplitude(6uV, N>10)..The left Median 2nd Digit orthodromic sensory nerve showed prolonged distal peak latency (3.5 ms, N<3.4) and reduced amplitude(7uV, N>10).The right Ulnar 5th Digit orthodromic sensory nerve showed prolonged distal peak latency (3.2 ms, N<3.1) and reduced amplitude(2uV, N>5). The right Ulnar 5th Digit orthodromic sensory nerve showed prolonged distal peak latency (3.78ms, N<3.1) and reduced amplitude(3uV, N>5).The right Median/Radial (thumb) comparison nerve showed abnormal peak latency difference (Median Thumb-Radial Thumb, 0.7 ms, N<0.5) with a relative median delay.   All muscles and remaining nerves (as detailed in the following tables) were within normal limits.  Conclusion: There is electrophysiologic evidence of sensorimotor polyneuropathy. Given the widespread polyneuropathy it is difficult to ascertain if there  is concomitant carpal tunnel syndrome. Delayed latencies of the median and ulnar nerves with decreased amplitudes may be due to median or ulnar neuropathy but may also be due to polyneuropathy. Abnormal peak latency difference on right Median/Radial thumb comparison nerve study may signify median neuropathy at the wrist .Clinical correlation suggested.  No indication of cervical radiculopathy.  Cc: Dr. Ronnette Hila, M.D.  [facility] [place], [place] [address] Tel: [phone] Fax: [phone]        [facility]    Nerve / Sites Muscle Latency Ref. Amplitude Ref. Rel Amp Segments Distance Velocity Ref. Area    ms ms mV mV %  cm m/s m/s mVms  R Median - APB     Wrist APB 5.2 ?4.4 3.8 ?4.0 100 Wrist - APB 7   10.5     Upper arm APB 10.4  3.9  103 Upper arm - Wrist 26 50 ?49 9.0  L Median - APB     Wrist APB 4.8 ?4.4 4.2 ?4.0 100 Wrist - APB 7   13.4     Upper arm APB 10.3  3.5  82.7 Upper arm - Wrist 27 49 ?49 11.1  R Ulnar - ADM     Wrist ADM 3.8 ?3.3 4.1 ?6.0 100 Wrist - ADM 7   12.2     B.Elbow ADM 8.1  4.7  114 B.Elbow - Wrist 21 49 ?49 11.5     A.Elbow ADM 10.3  4.4  95.1 A.Elbow - B.Elbow 10 45 ?49 11.0         A.Elbow - Wrist      L Ulnar - ADM     Wrist ADM 3.1 ?3.3 8.1 ?6.0 100 Wrist - ADM 7   25.0     B.Elbow ADM 6.6  7.2  88.7 B.Elbow - Wrist 18 51 ?49 21.3     A.Elbow ADM 9.1  6.7  93.2 A.Elbow - B.Elbow 12 49 ?49 20.7         A.Elbow - Wrist                 SNC    Nerve / Sites Rec. Site Peak Lat Ref.  Amp Ref. Segments Distance Peak Diff Ref.    ms ms V V  cm ms ms  R Radial - Anatomical snuff box (Forearm)     Forearm Wrist 3.1 ?2.9 4 ?15 Forearm - Wrist 10    R Median, Ulnar - Transcarpal comparison     Median Palm Wrist 2.5 ?2.2 10 ?35 Median Palm - Wrist 8       Ulnar Palm Wrist 2.3 ?2.2 3 ?12 Ulnar Palm - Wrist 8          Median Palm - Ulnar Palm  0.2 ?0.4  R Median, Radial - Thumb comparison     Median Wrist Thumb 3.7  7   Median Wrist - Thumb 10       Radial Wrist Thumb 3.0  4  Radial Wrist - Thumb 10          Median Wrist - Radial Wrist  0.7 ?0.5  R Median - Orthodromic (Dig II, Mid palm)     Dig II Wrist 3.5 ?3.4 6 ?10 Dig II - Wrist 13    L Median - Orthodromic (Dig II, Mid palm)     Dig II Wrist 3.5 ?3.4 7 ?10 Dig II - Wrist 13    R Ulnar - Orthodromic, (Dig V, Mid palm)     Dig V Wrist 3.2 ?3.1 2 ?5 Dig V - Wrist 11    L Ulnar - Orthodromic, (Dig V, Mid palm)     Dig V Wrist 3.5 ?3.1 3 ?5 Dig V - Wrist 32                      F  Wave    Nerve F Lat Ref.   ms ms  R Ulnar - ADM 33.7 ?32.0  L Ulnar - ADM 33.6 ?32.0         EMG full       EMG Summary Table    Spontaneous MUAP Recruitment  Muscle IA Fib PSW Fasc Other Amp Dur. Poly Pattern  R. Deltoid Normal None None None _______ Normal Normal Normal Normal  R. Trapezius Normal None None None _______ Normal Normal Normal Normal  R. Pronator teres Normal None None None _______ Normal Normal Normal Normal  R. First dorsal interosseous Normal None None None _______ Normal Normal Normal Normal  R. Opponens pollicis Normal None None None _______ Normal Normal Normal Normal  R. Cervical paraspinals (low) Normal None None None _______ Normal Normal Normal Normal

## 2017-01-01 NOTE — Addendum Note (Signed)
Addended by: Sarina Ill B on: 01/01/2017 07:58 PM   Modules accepted: Orders

## 2017-01-08 DIAGNOSIS — E113412 Type 2 diabetes mellitus with severe nonproliferative diabetic retinopathy with macular edema, left eye: Secondary | ICD-10-CM | POA: Diagnosis not present

## 2017-01-31 DIAGNOSIS — Z125 Encounter for screening for malignant neoplasm of prostate: Secondary | ICD-10-CM | POA: Diagnosis not present

## 2017-01-31 DIAGNOSIS — I10 Essential (primary) hypertension: Secondary | ICD-10-CM | POA: Diagnosis not present

## 2017-01-31 DIAGNOSIS — E789 Disorder of lipoprotein metabolism, unspecified: Secondary | ICD-10-CM | POA: Diagnosis not present

## 2017-01-31 DIAGNOSIS — E118 Type 2 diabetes mellitus with unspecified complications: Secondary | ICD-10-CM | POA: Diagnosis not present

## 2017-01-31 DIAGNOSIS — E162 Hypoglycemia, unspecified: Secondary | ICD-10-CM | POA: Diagnosis not present

## 2017-01-31 DIAGNOSIS — I482 Chronic atrial fibrillation: Secondary | ICD-10-CM | POA: Diagnosis not present

## 2017-02-06 DIAGNOSIS — E118 Type 2 diabetes mellitus with unspecified complications: Secondary | ICD-10-CM | POA: Diagnosis not present

## 2017-02-06 DIAGNOSIS — I10 Essential (primary) hypertension: Secondary | ICD-10-CM | POA: Diagnosis not present

## 2017-02-06 DIAGNOSIS — M1990 Unspecified osteoarthritis, unspecified site: Secondary | ICD-10-CM | POA: Diagnosis not present

## 2017-02-12 DIAGNOSIS — E113412 Type 2 diabetes mellitus with severe nonproliferative diabetic retinopathy with macular edema, left eye: Secondary | ICD-10-CM | POA: Diagnosis not present

## 2017-02-12 DIAGNOSIS — Z961 Presence of intraocular lens: Secondary | ICD-10-CM | POA: Diagnosis not present

## 2017-02-12 DIAGNOSIS — H359 Unspecified retinal disorder: Secondary | ICD-10-CM | POA: Diagnosis not present

## 2017-02-12 DIAGNOSIS — E113491 Type 2 diabetes mellitus with severe nonproliferative diabetic retinopathy without macular edema, right eye: Secondary | ICD-10-CM | POA: Diagnosis not present

## 2017-04-30 DIAGNOSIS — H43813 Vitreous degeneration, bilateral: Secondary | ICD-10-CM | POA: Diagnosis not present

## 2017-04-30 DIAGNOSIS — H43811 Vitreous degeneration, right eye: Secondary | ICD-10-CM | POA: Diagnosis not present

## 2017-04-30 DIAGNOSIS — E113413 Type 2 diabetes mellitus with severe nonproliferative diabetic retinopathy with macular edema, bilateral: Secondary | ICD-10-CM | POA: Diagnosis not present

## 2017-04-30 DIAGNOSIS — E113412 Type 2 diabetes mellitus with severe nonproliferative diabetic retinopathy with macular edema, left eye: Secondary | ICD-10-CM | POA: Diagnosis not present

## 2017-04-30 DIAGNOSIS — E113411 Type 2 diabetes mellitus with severe nonproliferative diabetic retinopathy with macular edema, right eye: Secondary | ICD-10-CM | POA: Diagnosis not present

## 2017-04-30 DIAGNOSIS — H43812 Vitreous degeneration, left eye: Secondary | ICD-10-CM | POA: Diagnosis not present

## 2017-05-03 DIAGNOSIS — E113413 Type 2 diabetes mellitus with severe nonproliferative diabetic retinopathy with macular edema, bilateral: Secondary | ICD-10-CM | POA: Diagnosis not present

## 2017-05-03 DIAGNOSIS — E113411 Type 2 diabetes mellitus with severe nonproliferative diabetic retinopathy with macular edema, right eye: Secondary | ICD-10-CM | POA: Diagnosis not present

## 2017-05-03 DIAGNOSIS — E113412 Type 2 diabetes mellitus with severe nonproliferative diabetic retinopathy with macular edema, left eye: Secondary | ICD-10-CM | POA: Diagnosis not present

## 2017-05-23 DIAGNOSIS — R9439 Abnormal result of other cardiovascular function study: Secondary | ICD-10-CM | POA: Diagnosis not present

## 2017-05-23 DIAGNOSIS — I739 Peripheral vascular disease, unspecified: Secondary | ICD-10-CM | POA: Diagnosis not present

## 2017-05-23 DIAGNOSIS — I10 Essential (primary) hypertension: Secondary | ICD-10-CM | POA: Diagnosis not present

## 2017-05-23 DIAGNOSIS — R0602 Shortness of breath: Secondary | ICD-10-CM | POA: Diagnosis not present

## 2017-06-05 DIAGNOSIS — E118 Type 2 diabetes mellitus with unspecified complications: Secondary | ICD-10-CM | POA: Diagnosis not present

## 2017-06-05 DIAGNOSIS — I10 Essential (primary) hypertension: Secondary | ICD-10-CM | POA: Diagnosis not present

## 2017-06-12 DIAGNOSIS — E118 Type 2 diabetes mellitus with unspecified complications: Secondary | ICD-10-CM | POA: Diagnosis not present

## 2017-06-12 DIAGNOSIS — E789 Disorder of lipoprotein metabolism, unspecified: Secondary | ICD-10-CM | POA: Diagnosis not present

## 2017-06-12 DIAGNOSIS — I10 Essential (primary) hypertension: Secondary | ICD-10-CM | POA: Diagnosis not present

## 2017-06-14 DIAGNOSIS — E113412 Type 2 diabetes mellitus with severe nonproliferative diabetic retinopathy with macular edema, left eye: Secondary | ICD-10-CM | POA: Diagnosis not present

## 2017-06-14 DIAGNOSIS — H3562 Retinal hemorrhage, left eye: Secondary | ICD-10-CM | POA: Diagnosis not present

## 2017-06-14 DIAGNOSIS — H43812 Vitreous degeneration, left eye: Secondary | ICD-10-CM | POA: Diagnosis not present

## 2017-06-14 DIAGNOSIS — H43813 Vitreous degeneration, bilateral: Secondary | ICD-10-CM | POA: Diagnosis not present

## 2017-06-14 DIAGNOSIS — E113413 Type 2 diabetes mellitus with severe nonproliferative diabetic retinopathy with macular edema, bilateral: Secondary | ICD-10-CM | POA: Diagnosis not present

## 2017-06-14 DIAGNOSIS — E113411 Type 2 diabetes mellitus with severe nonproliferative diabetic retinopathy with macular edema, right eye: Secondary | ICD-10-CM | POA: Diagnosis not present

## 2017-08-22 DIAGNOSIS — E113411 Type 2 diabetes mellitus with severe nonproliferative diabetic retinopathy with macular edema, right eye: Secondary | ICD-10-CM | POA: Diagnosis not present

## 2017-08-22 DIAGNOSIS — H43813 Vitreous degeneration, bilateral: Secondary | ICD-10-CM | POA: Diagnosis not present

## 2017-08-22 DIAGNOSIS — E113413 Type 2 diabetes mellitus with severe nonproliferative diabetic retinopathy with macular edema, bilateral: Secondary | ICD-10-CM | POA: Diagnosis not present

## 2017-08-22 DIAGNOSIS — H43812 Vitreous degeneration, left eye: Secondary | ICD-10-CM | POA: Diagnosis not present

## 2017-08-22 DIAGNOSIS — H43811 Vitreous degeneration, right eye: Secondary | ICD-10-CM | POA: Diagnosis not present

## 2017-08-22 DIAGNOSIS — E113412 Type 2 diabetes mellitus with severe nonproliferative diabetic retinopathy with macular edema, left eye: Secondary | ICD-10-CM | POA: Diagnosis not present

## 2017-10-09 DIAGNOSIS — E789 Disorder of lipoprotein metabolism, unspecified: Secondary | ICD-10-CM | POA: Diagnosis not present

## 2017-10-09 DIAGNOSIS — E118 Type 2 diabetes mellitus with unspecified complications: Secondary | ICD-10-CM | POA: Diagnosis not present

## 2017-10-15 DIAGNOSIS — E118 Type 2 diabetes mellitus with unspecified complications: Secondary | ICD-10-CM | POA: Diagnosis not present

## 2017-10-15 DIAGNOSIS — I10 Essential (primary) hypertension: Secondary | ICD-10-CM | POA: Diagnosis not present

## 2017-10-15 DIAGNOSIS — E7800 Pure hypercholesterolemia, unspecified: Secondary | ICD-10-CM | POA: Diagnosis not present

## 2017-10-15 DIAGNOSIS — M546 Pain in thoracic spine: Secondary | ICD-10-CM | POA: Diagnosis not present

## 2017-12-17 DIAGNOSIS — E7800 Pure hypercholesterolemia, unspecified: Secondary | ICD-10-CM | POA: Diagnosis not present

## 2017-12-17 DIAGNOSIS — E118 Type 2 diabetes mellitus with unspecified complications: Secondary | ICD-10-CM | POA: Diagnosis not present

## 2017-12-20 DIAGNOSIS — E162 Hypoglycemia, unspecified: Secondary | ICD-10-CM | POA: Diagnosis not present

## 2017-12-20 DIAGNOSIS — E118 Type 2 diabetes mellitus with unspecified complications: Secondary | ICD-10-CM | POA: Diagnosis not present

## 2017-12-20 DIAGNOSIS — E789 Disorder of lipoprotein metabolism, unspecified: Secondary | ICD-10-CM | POA: Diagnosis not present

## 2017-12-20 DIAGNOSIS — I10 Essential (primary) hypertension: Secondary | ICD-10-CM | POA: Diagnosis not present

## 2017-12-20 DIAGNOSIS — I482 Chronic atrial fibrillation: Secondary | ICD-10-CM | POA: Diagnosis not present

## 2017-12-20 DIAGNOSIS — I2510 Atherosclerotic heart disease of native coronary artery without angina pectoris: Secondary | ICD-10-CM | POA: Diagnosis not present

## 2017-12-20 DIAGNOSIS — R109 Unspecified abdominal pain: Secondary | ICD-10-CM | POA: Diagnosis not present

## 2017-12-26 ENCOUNTER — Other Ambulatory Visit: Payer: Self-pay | Admitting: Internal Medicine

## 2017-12-26 DIAGNOSIS — R109 Unspecified abdominal pain: Secondary | ICD-10-CM

## 2018-01-04 ENCOUNTER — Ambulatory Visit
Admission: RE | Admit: 2018-01-04 | Discharge: 2018-01-04 | Disposition: A | Discharge status: Home / Self Care | Payer: Commercial Managed Care - HMO | Source: Ambulatory Visit | Attending: Internal Medicine | Admitting: Internal Medicine

## 2018-01-04 DIAGNOSIS — R109 Unspecified abdominal pain: Secondary | ICD-10-CM

## 2018-01-04 DIAGNOSIS — K8020 Calculus of gallbladder without cholecystitis without obstruction: Secondary | ICD-10-CM | POA: Diagnosis not present

## 2018-01-07 DIAGNOSIS — I10 Essential (primary) hypertension: Secondary | ICD-10-CM | POA: Diagnosis not present

## 2018-01-07 DIAGNOSIS — E118 Type 2 diabetes mellitus with unspecified complications: Secondary | ICD-10-CM | POA: Diagnosis not present

## 2018-01-07 DIAGNOSIS — R109 Unspecified abdominal pain: Secondary | ICD-10-CM | POA: Diagnosis not present

## 2018-01-07 DIAGNOSIS — E789 Disorder of lipoprotein metabolism, unspecified: Secondary | ICD-10-CM | POA: Diagnosis not present

## 2018-01-08 ENCOUNTER — Other Ambulatory Visit: Payer: Self-pay | Admitting: Internal Medicine

## 2018-01-09 ENCOUNTER — Other Ambulatory Visit: Payer: Self-pay | Admitting: Internal Medicine

## 2018-01-09 DIAGNOSIS — R109 Unspecified abdominal pain: Secondary | ICD-10-CM

## 2018-01-16 ENCOUNTER — Ambulatory Visit
Admission: RE | Admit: 2018-01-16 | Discharge: 2018-01-16 | Disposition: A | Discharge status: Home / Self Care | Payer: Self-pay | Source: Ambulatory Visit | Attending: Internal Medicine | Admitting: Internal Medicine

## 2018-01-16 DIAGNOSIS — R109 Unspecified abdominal pain: Secondary | ICD-10-CM

## 2018-01-16 DIAGNOSIS — R1031 Right lower quadrant pain: Secondary | ICD-10-CM | POA: Diagnosis not present

## 2018-01-16 MED ORDER — IOPAMIDOL (ISOVUE-300) INJECTION 61%
100.0000 mL | Freq: Once | INTRAVENOUS | Status: AC | PRN
  Administered 2018-01-16: 100 mL via INTRAVENOUS

## 2018-01-21 DIAGNOSIS — H43813 Vitreous degeneration, bilateral: Secondary | ICD-10-CM | POA: Diagnosis not present

## 2018-01-21 DIAGNOSIS — E113413 Type 2 diabetes mellitus with severe nonproliferative diabetic retinopathy with macular edema, bilateral: Secondary | ICD-10-CM | POA: Diagnosis not present

## 2018-01-30 DIAGNOSIS — E113412 Type 2 diabetes mellitus with severe nonproliferative diabetic retinopathy with macular edema, left eye: Secondary | ICD-10-CM | POA: Diagnosis not present

## 2018-02-13 DIAGNOSIS — R109 Unspecified abdominal pain: Secondary | ICD-10-CM | POA: Diagnosis not present

## 2018-02-13 DIAGNOSIS — E118 Type 2 diabetes mellitus with unspecified complications: Secondary | ICD-10-CM | POA: Diagnosis not present

## 2018-02-13 DIAGNOSIS — M546 Pain in thoracic spine: Secondary | ICD-10-CM | POA: Diagnosis not present

## 2018-02-13 DIAGNOSIS — I2510 Atherosclerotic heart disease of native coronary artery without angina pectoris: Secondary | ICD-10-CM | POA: Diagnosis not present

## 2018-02-13 DIAGNOSIS — M79673 Pain in unspecified foot: Secondary | ICD-10-CM | POA: Diagnosis not present

## 2018-02-27 DIAGNOSIS — R9439 Abnormal result of other cardiovascular function study: Secondary | ICD-10-CM | POA: Diagnosis not present

## 2018-02-27 DIAGNOSIS — I10 Essential (primary) hypertension: Secondary | ICD-10-CM | POA: Diagnosis not present

## 2018-02-27 DIAGNOSIS — M79671 Pain in right foot: Secondary | ICD-10-CM | POA: Diagnosis not present

## 2018-02-27 DIAGNOSIS — I2510 Atherosclerotic heart disease of native coronary artery without angina pectoris: Secondary | ICD-10-CM | POA: Diagnosis not present

## 2018-03-25 DIAGNOSIS — R0602 Shortness of breath: Secondary | ICD-10-CM | POA: Diagnosis not present

## 2018-03-25 DIAGNOSIS — I2510 Atherosclerotic heart disease of native coronary artery without angina pectoris: Secondary | ICD-10-CM | POA: Diagnosis not present

## 2018-03-25 DIAGNOSIS — I10 Essential (primary) hypertension: Secondary | ICD-10-CM | POA: Diagnosis not present

## 2018-03-25 DIAGNOSIS — R9431 Abnormal electrocardiogram [ECG] [EKG]: Secondary | ICD-10-CM | POA: Diagnosis not present

## 2018-03-29 DIAGNOSIS — M722 Plantar fascial fibromatosis: Secondary | ICD-10-CM | POA: Diagnosis not present

## 2018-04-22 DIAGNOSIS — E118 Type 2 diabetes mellitus with unspecified complications: Secondary | ICD-10-CM | POA: Diagnosis not present

## 2018-04-22 DIAGNOSIS — E789 Disorder of lipoprotein metabolism, unspecified: Secondary | ICD-10-CM | POA: Diagnosis not present

## 2018-04-30 DIAGNOSIS — I2510 Atherosclerotic heart disease of native coronary artery without angina pectoris: Secondary | ICD-10-CM | POA: Diagnosis not present

## 2018-05-10 DIAGNOSIS — M722 Plantar fascial fibromatosis: Secondary | ICD-10-CM | POA: Diagnosis not present

## 2018-05-14 DIAGNOSIS — R9439 Abnormal result of other cardiovascular function study: Secondary | ICD-10-CM | POA: Diagnosis not present

## 2018-05-14 DIAGNOSIS — I2510 Atherosclerotic heart disease of native coronary artery without angina pectoris: Secondary | ICD-10-CM | POA: Diagnosis not present

## 2018-05-14 DIAGNOSIS — R5381 Other malaise: Secondary | ICD-10-CM | POA: Diagnosis not present

## 2018-05-14 DIAGNOSIS — R5383 Other fatigue: Secondary | ICD-10-CM | POA: Diagnosis not present

## 2018-05-21 DIAGNOSIS — M722 Plantar fascial fibromatosis: Secondary | ICD-10-CM | POA: Diagnosis not present

## 2018-05-22 DIAGNOSIS — M722 Plantar fascial fibromatosis: Secondary | ICD-10-CM | POA: Diagnosis not present

## 2018-05-24 DIAGNOSIS — M722 Plantar fascial fibromatosis: Secondary | ICD-10-CM | POA: Diagnosis not present

## 2018-05-29 DIAGNOSIS — H43813 Vitreous degeneration, bilateral: Secondary | ICD-10-CM | POA: Diagnosis not present

## 2018-05-29 DIAGNOSIS — E113413 Type 2 diabetes mellitus with severe nonproliferative diabetic retinopathy with macular edema, bilateral: Secondary | ICD-10-CM | POA: Diagnosis not present

## 2018-05-30 DIAGNOSIS — R0600 Dyspnea, unspecified: Secondary | ICD-10-CM | POA: Diagnosis not present

## 2018-05-30 DIAGNOSIS — M722 Plantar fascial fibromatosis: Secondary | ICD-10-CM | POA: Diagnosis not present

## 2018-05-31 DIAGNOSIS — R0600 Dyspnea, unspecified: Secondary | ICD-10-CM | POA: Diagnosis not present

## 2018-06-03 DIAGNOSIS — M722 Plantar fascial fibromatosis: Secondary | ICD-10-CM | POA: Diagnosis not present

## 2018-06-06 DIAGNOSIS — M722 Plantar fascial fibromatosis: Secondary | ICD-10-CM | POA: Diagnosis not present

## 2018-06-11 DIAGNOSIS — E113413 Type 2 diabetes mellitus with severe nonproliferative diabetic retinopathy with macular edema, bilateral: Secondary | ICD-10-CM | POA: Diagnosis not present

## 2018-06-11 DIAGNOSIS — E113411 Type 2 diabetes mellitus with severe nonproliferative diabetic retinopathy with macular edema, right eye: Secondary | ICD-10-CM | POA: Diagnosis not present

## 2018-06-11 DIAGNOSIS — H43813 Vitreous degeneration, bilateral: Secondary | ICD-10-CM | POA: Diagnosis not present

## 2018-06-13 DIAGNOSIS — M722 Plantar fascial fibromatosis: Secondary | ICD-10-CM | POA: Diagnosis not present

## 2018-06-27 DIAGNOSIS — R5383 Other fatigue: Secondary | ICD-10-CM | POA: Diagnosis not present

## 2018-06-27 DIAGNOSIS — R9439 Abnormal result of other cardiovascular function study: Secondary | ICD-10-CM | POA: Diagnosis not present

## 2018-06-27 DIAGNOSIS — I2510 Atherosclerotic heart disease of native coronary artery without angina pectoris: Secondary | ICD-10-CM | POA: Diagnosis not present

## 2018-06-27 DIAGNOSIS — R5381 Other malaise: Secondary | ICD-10-CM | POA: Diagnosis not present

## 2018-07-11 DIAGNOSIS — E113413 Type 2 diabetes mellitus with severe nonproliferative diabetic retinopathy with macular edema, bilateral: Secondary | ICD-10-CM | POA: Diagnosis not present

## 2018-07-11 DIAGNOSIS — E113411 Type 2 diabetes mellitus with severe nonproliferative diabetic retinopathy with macular edema, right eye: Secondary | ICD-10-CM | POA: Diagnosis not present

## 2018-07-17 DIAGNOSIS — M79671 Pain in right foot: Secondary | ICD-10-CM | POA: Diagnosis not present

## 2018-08-12 DIAGNOSIS — I739 Peripheral vascular disease, unspecified: Secondary | ICD-10-CM | POA: Diagnosis not present

## 2018-08-12 DIAGNOSIS — R9439 Abnormal result of other cardiovascular function study: Secondary | ICD-10-CM | POA: Diagnosis not present

## 2018-08-29 ENCOUNTER — Emergency Department (HOSPITAL_COMMUNITY): Payer: Medicare HMO

## 2018-08-29 ENCOUNTER — Emergency Department (HOSPITAL_COMMUNITY)
Admission: EM | Admit: 2018-08-29 | Discharge: 2018-08-29 | Disposition: A | Discharge status: Home / Self Care | Payer: Medicare HMO | Attending: Emergency Medicine | Admitting: Emergency Medicine

## 2018-08-29 ENCOUNTER — Encounter (HOSPITAL_COMMUNITY): Payer: Self-pay

## 2018-08-29 ENCOUNTER — Other Ambulatory Visit: Payer: Self-pay

## 2018-08-29 DIAGNOSIS — E119 Type 2 diabetes mellitus without complications: Secondary | ICD-10-CM | POA: Diagnosis not present

## 2018-08-29 DIAGNOSIS — M795 Residual foreign body in soft tissue: Secondary | ICD-10-CM

## 2018-08-29 DIAGNOSIS — Y939 Activity, unspecified: Secondary | ICD-10-CM | POA: Diagnosis not present

## 2018-08-29 DIAGNOSIS — Y929 Unspecified place or not applicable: Secondary | ICD-10-CM | POA: Diagnosis not present

## 2018-08-29 DIAGNOSIS — Z794 Long term (current) use of insulin: Secondary | ICD-10-CM | POA: Diagnosis not present

## 2018-08-29 DIAGNOSIS — S31145A Puncture wound of abdominal wall with foreign body, periumbilic region without penetration into peritoneal cavity, initial encounter: Secondary | ICD-10-CM | POA: Diagnosis present

## 2018-08-29 DIAGNOSIS — Z0389 Encounter for observation for other suspected diseases and conditions ruled out: Secondary | ICD-10-CM | POA: Diagnosis not present

## 2018-08-29 DIAGNOSIS — I10 Essential (primary) hypertension: Secondary | ICD-10-CM | POA: Insufficient documentation

## 2018-08-29 DIAGNOSIS — Z711 Person with feared health complaint in whom no diagnosis is made: Secondary | ICD-10-CM | POA: Insufficient documentation

## 2018-08-29 DIAGNOSIS — Y999 Unspecified external cause status: Secondary | ICD-10-CM | POA: Insufficient documentation

## 2018-08-29 DIAGNOSIS — X58XXXA Exposure to other specified factors, initial encounter: Secondary | ICD-10-CM | POA: Insufficient documentation

## 2018-08-29 NOTE — ED Provider Notes (Addendum)
Emergency Department Provider Note   I have reviewed the triage vital signs and the nursing notes.   HISTORY  Chief Complaint Insulin needle in abdomen   HPI Matthew Zimmerman is a 72 y.o. male with PMH of GERD, DM, HLD, and HTN presents to the emergency department for evaluation of possible insulin needle in the subcutaneous tissues.  The patient was administering insulin through 1/2 cc syringe.  When he removed the needle he states he typically bends these to keep others from using them but noticed there was no needle attached.  He did not have any abnormality in the administration of insulin.  No abdominal pain.  This occurred approximately 1 hour prior to ED evaluation.  He initially reached out to his PCP who referred him to the ED.   Past Medical History:  Diagnosis Date  . Carpal tunnel syndrome on both sides   . Chest pain   . Diabetes mellitus without complication ([place])   . GERD (gastroesophageal reflux disease)   . Headache   . Hypercholesteremia   . Hyperlipidemia   . PE (pulmonary embolism)   . Shortness of breath dyspnea    PE     Patient Active Problem List   Diagnosis Date Noted  . Neck pain 05/20/2014  . Intractable back pain 05/20/2014  . Warfarin-induced coagulopathy ([place]) 05/20/2014  . DM 05/12/2010  . HYPERTENSION 05/12/2010  . GERD 05/12/2010  . COLONIC POLYPS, HX OF 05/12/2010    Past Surgical History:  Procedure Laterality Date  . BILATERAL CARPAL TUNNEL RELEASE Bilateral   . COLONOSCOPY    . ELBOW SURGERY     BONE SPURS REMOVED B ELBOWS  . EYE SURGERY Bilateral    cataracts  . FOOT SURGERY     HEEL SPURS REMOVED B HEELS  . I&D EXTREMITY Right 09/18/2014   Procedure: IRRIGATION AND DEBRIDEMENT RIGHT THUMB, open fracture repair;  Surgeon: Linna Hoff, MD;  Location: [facility];  Service: Orthopedics;  Laterality: Right;  . LEG SURGERY Left    left thigh table saw accident, "cut to the bone", I&D/suturing  . SHOULDER SURGERY Left    Bone  spur  . SPINE SURGERY  09/04/1985   lumbar spine  . TOE SURGERY      Allergies Cortisone  Family History  Problem Relation Age of Onset  . Cancer Mother   . Diabetes Father     Social History Social History   Tobacco Use  . Smoking status: Former Research scientist (life sciences)  . Smokeless tobacco: Former Network engineer Use Topics  . Alcohol use: No  . Drug use: No    Review of Systems  Constitutional: No fever/chills Gastrointestinal: No abdominal pain.  No nausea, no vomiting.  No diarrhea.  Skin: Negative for rash. Concern for retained FB in the LLQ abdomen.  Neurological: Negative for headaches, focal weakness or numbness.  10-point ROS otherwise negative.  ____________________________________________   PHYSICAL EXAM:  VITAL SIGNS: ED Triage Vitals  Enc Vitals Group     BP 08/29/18 1127 (!) 159/86     Pulse Rate 08/29/18 1127 66     Resp 08/29/18 1127 16     Temp 08/29/18 1127 97.6 F (36.4 C)     Temp Source 08/29/18 1127 Oral     SpO2 08/29/18 1127 99 %     Weight 08/29/18 1124 198 lb (89.8 kg)     Height 08/29/18 1124 5' 8.5" (1.74 m)     Pain Score 08/29/18 1124 0  Constitutional: Alert and oriented. Well appearing and in no acute distress. Eyes: Conjunctivae are normal.  Head: Atraumatic. Nose: No congestion/rhinnorhea. Mouth/Throat: Mucous membranes are moist.   Neck: No stridor.  Cardiovascular: Normal rate, regular rhythm.  Respiratory: Normal respiratory effort.   Gastrointestinal: Soft and nontender. No distention.  Musculoskeletal: No lower extremity tenderness nor edema. No gross deformities of extremities. Neurologic:  Normal speech and language. No gross focal neurologic deficits are appreciated.  Skin:  Skin is warm, dry and intact. No rash noted.  ____________________________________________  RADIOLOGY  Dg Abd 2 Views  Result Date: 08/29/2018 CLINICAL DATA:  Possible foreign body to left of naval near level of iliac crest. EXAM: ABDOMEN - 2 VIEW  COMPARISON:  None. FINDINGS: The bowel gas pattern is normal. There is no evidence of free air. No radio-opaque calculi or other significant radiographic abnormality is seen. IMPRESSION: Negative. Electronically Signed   By: Dorise Bullion III M.D   On: 08/29/2018 12:12    ____________________________________________   PROCEDURES  Procedure(s) performed:   Procedures  ULTRASOUND LIMITED SOFT TISSUE/ MUSCULOSKELETAL: Abdomen Indication: Complaint of possible metallic FB in abdominal wall Linear probe used to evaluate area of interest in two planes. Findings: No FB appreciated.  Performed by: Dr Laverta Baltimore Images saved electronically  ____________________________________________   INITIAL IMPRESSION / [place] / ED COURSE  Pertinent labs & imaging results that were available during my care of the patient were reviewed by me and considered in my medical decision making (see chart for details).  Patient presents to the emergency department with concern for possible insulin needle which broke off while administering insulin this morning.  I do not see any injection point.  There is no bleeding, redness, or warmth however the insulin needle was used only an hour prior to ED evaluation.  I performed a quick bedside ultrasound but did not appreciate any obvious foreign body in the subcutaneous tissues.  Plan for plain film of the abdomen and reassess.  Plain film negative for metallic FB. Discussed ED return precautions for infected FB. Plan for discharge.  ____________________________________________  FINAL CLINICAL IMPRESSION(S) / ED DIAGNOSES  Final diagnoses:  Foreign body (FB) in soft tissue    Note:  This document was prepared using Dragon voice recognition software and may include unintentional dictation errors.  Nanda Quinton, MD Emergency Medicine    , Wonda Olds, MD 08/29/18 2025    Margette Fast, MD 09/04/18 0900

## 2018-08-29 NOTE — Discharge Instructions (Signed)
No insulin needle was found on your x-ray or ultrasound. Return to the ED with any redness, drainage, or other concerning findings.

## 2018-08-29 NOTE — ED Triage Notes (Signed)
Per pt he was giving himself insulin around 1100 this morning in his left lower abdomen and when he pulled they syringe off the needle was still in his abdomen. Pt denies any pain.

## 2018-09-30 DIAGNOSIS — I2510 Atherosclerotic heart disease of native coronary artery without angina pectoris: Secondary | ICD-10-CM | POA: Diagnosis not present

## 2018-09-30 DIAGNOSIS — E1140 Type 2 diabetes mellitus with diabetic neuropathy, unspecified: Secondary | ICD-10-CM | POA: Diagnosis not present

## 2018-09-30 DIAGNOSIS — G4734 Idiopathic sleep related nonobstructive alveolar hypoventilation: Secondary | ICD-10-CM | POA: Diagnosis not present

## 2018-09-30 DIAGNOSIS — R0609 Other forms of dyspnea: Secondary | ICD-10-CM | POA: Diagnosis not present

## 2018-10-25 DIAGNOSIS — E1140 Type 2 diabetes mellitus with diabetic neuropathy, unspecified: Secondary | ICD-10-CM | POA: Diagnosis not present

## 2018-10-25 DIAGNOSIS — E789 Disorder of lipoprotein metabolism, unspecified: Secondary | ICD-10-CM | POA: Diagnosis not present

## 2018-10-25 DIAGNOSIS — M5136 Other intervertebral disc degeneration, lumbar region: Secondary | ICD-10-CM | POA: Diagnosis not present

## 2018-10-25 DIAGNOSIS — E118 Type 2 diabetes mellitus with unspecified complications: Secondary | ICD-10-CM | POA: Diagnosis not present

## 2018-10-25 DIAGNOSIS — E11319 Type 2 diabetes mellitus with unspecified diabetic retinopathy without macular edema: Secondary | ICD-10-CM | POA: Diagnosis not present

## 2018-10-25 DIAGNOSIS — I10 Essential (primary) hypertension: Secondary | ICD-10-CM | POA: Diagnosis not present

## 2018-10-25 DIAGNOSIS — Z125 Encounter for screening for malignant neoplasm of prostate: Secondary | ICD-10-CM | POA: Diagnosis not present

## 2018-10-25 DIAGNOSIS — M5134 Other intervertebral disc degeneration, thoracic region: Secondary | ICD-10-CM | POA: Diagnosis not present

## 2018-10-25 DIAGNOSIS — M79604 Pain in right leg: Secondary | ICD-10-CM | POA: Diagnosis not present

## 2018-10-25 DIAGNOSIS — K649 Unspecified hemorrhoids: Secondary | ICD-10-CM | POA: Diagnosis not present

## 2018-10-25 DIAGNOSIS — I2510 Atherosclerotic heart disease of native coronary artery without angina pectoris: Secondary | ICD-10-CM | POA: Diagnosis not present

## 2018-11-08 DIAGNOSIS — R972 Elevated prostate specific antigen [PSA]: Secondary | ICD-10-CM | POA: Diagnosis not present

## 2018-11-08 DIAGNOSIS — M79606 Pain in leg, unspecified: Secondary | ICD-10-CM | POA: Diagnosis not present

## 2018-11-08 DIAGNOSIS — E118 Type 2 diabetes mellitus with unspecified complications: Secondary | ICD-10-CM | POA: Diagnosis not present

## 2018-11-08 DIAGNOSIS — E789 Disorder of lipoprotein metabolism, unspecified: Secondary | ICD-10-CM | POA: Diagnosis not present

## 2018-11-08 DIAGNOSIS — R05 Cough: Secondary | ICD-10-CM | POA: Diagnosis not present

## 2018-11-08 DIAGNOSIS — J029 Acute pharyngitis, unspecified: Secondary | ICD-10-CM | POA: Diagnosis not present

## 2018-11-08 DIAGNOSIS — E11319 Type 2 diabetes mellitus with unspecified diabetic retinopathy without macular edema: Secondary | ICD-10-CM | POA: Diagnosis not present

## 2018-11-14 DIAGNOSIS — E113412 Type 2 diabetes mellitus with severe nonproliferative diabetic retinopathy with macular edema, left eye: Secondary | ICD-10-CM | POA: Diagnosis not present

## 2018-11-14 DIAGNOSIS — H43812 Vitreous degeneration, left eye: Secondary | ICD-10-CM | POA: Diagnosis not present

## 2018-11-14 DIAGNOSIS — H43811 Vitreous degeneration, right eye: Secondary | ICD-10-CM | POA: Diagnosis not present

## 2018-11-14 DIAGNOSIS — E113411 Type 2 diabetes mellitus with severe nonproliferative diabetic retinopathy with macular edema, right eye: Secondary | ICD-10-CM | POA: Diagnosis not present

## 2018-11-29 ENCOUNTER — Encounter: Payer: Self-pay | Admitting: *Deleted

## 2018-12-02 ENCOUNTER — Other Ambulatory Visit: Payer: Self-pay | Admitting: *Deleted

## 2018-12-02 NOTE — Patient Outreach (Signed)
  [place] [facility]) Care Management Chronic Special Needs Program  12/02/2018  Name: Matthew Zimmerman DOB: 1946/01/10  MRN: [id number]  [name] is enrolled in a chronic special needs plan for  Diabetes. A completed health risk assessment has not been received from the client and client has not responded to outreach attempts by their health care concierge.  The client's individualized care plan was developed based on available data.  Plan:  . Send unsuccessful outreach letter with a copy of individualized care plan to client . [place] to client . Send educational material on self management of atrial fibrillation and HTN to client . Send individualized care plan to provider  Chronic care management coordinator, Thea Silversmith, will attempt outreach in 2-4 months.   Barrington Ellison RN,CCM,CDE [place] Management Coordinator Office Phone [phone] Office Fax [phone]

## 2019-01-09 ENCOUNTER — Other Ambulatory Visit: Payer: Self-pay | Admitting: Cardiology

## 2019-01-09 NOTE — Telephone Encounter (Signed)
Please fill

## 2019-01-16 ENCOUNTER — Ambulatory Visit: Payer: Self-pay

## 2019-01-30 NOTE — Progress Notes (Signed)
This encounter was created in error - please disregard.  This encounter was created in error - please disregard.

## 2019-02-21 DIAGNOSIS — R5383 Other fatigue: Secondary | ICD-10-CM | POA: Diagnosis not present

## 2019-02-21 DIAGNOSIS — I10 Essential (primary) hypertension: Secondary | ICD-10-CM | POA: Diagnosis not present

## 2019-02-21 DIAGNOSIS — E785 Hyperlipidemia, unspecified: Secondary | ICD-10-CM | POA: Diagnosis not present

## 2019-02-21 DIAGNOSIS — R079 Chest pain, unspecified: Secondary | ICD-10-CM | POA: Diagnosis not present

## 2019-02-21 DIAGNOSIS — E789 Disorder of lipoprotein metabolism, unspecified: Secondary | ICD-10-CM | POA: Diagnosis not present

## 2019-02-21 DIAGNOSIS — Z0000 Encounter for general adult medical examination without abnormal findings: Secondary | ICD-10-CM | POA: Diagnosis not present

## 2019-02-21 DIAGNOSIS — Z125 Encounter for screening for malignant neoplasm of prostate: Secondary | ICD-10-CM | POA: Diagnosis not present

## 2019-02-21 DIAGNOSIS — E118 Type 2 diabetes mellitus with unspecified complications: Secondary | ICD-10-CM | POA: Diagnosis not present

## 2019-02-21 DIAGNOSIS — I2510 Atherosclerotic heart disease of native coronary artery without angina pectoris: Secondary | ICD-10-CM | POA: Diagnosis not present

## 2019-02-25 DIAGNOSIS — I2699 Other pulmonary embolism without acute cor pulmonale: Secondary | ICD-10-CM | POA: Diagnosis not present

## 2019-02-28 ENCOUNTER — Ambulatory Visit: Payer: Self-pay | Admitting: *Deleted

## 2019-02-28 ENCOUNTER — Other Ambulatory Visit: Payer: Self-pay | Admitting: *Deleted

## 2019-02-28 NOTE — Patient Outreach (Signed)
  [facility]) Care Management Chronic Special Needs Program    02/28/2019  Name: Matthew Zimmerman, DOB: 1946-02-18  MRN: [id number]   [name] is enrolled in a chronic special needs plan for Diabetes.  Attempted to reach [name] via home number; no answer and HIPAA compliant message left. Plan for second outreach call in one week.   Kelli Churn RN, CCM, [facility] Care Management [phone]

## 2019-03-05 DIAGNOSIS — I10 Essential (primary) hypertension: Secondary | ICD-10-CM | POA: Diagnosis not present

## 2019-03-05 DIAGNOSIS — Z86711 Personal history of pulmonary embolism: Secondary | ICD-10-CM | POA: Diagnosis not present

## 2019-03-05 DIAGNOSIS — E118 Type 2 diabetes mellitus with unspecified complications: Secondary | ICD-10-CM | POA: Diagnosis not present

## 2019-03-05 DIAGNOSIS — E789 Disorder of lipoprotein metabolism, unspecified: Secondary | ICD-10-CM | POA: Diagnosis not present

## 2019-03-06 ENCOUNTER — Other Ambulatory Visit: Payer: Self-pay | Admitting: *Deleted

## 2019-03-06 NOTE — Patient Outreach (Signed)
  [facility]) Care Management Chronic Special Needs Program    03/06/2019  Name: Matthew Zimmerman, DOB: [birth year]  MRN: [id number]   [name] is enrolled in a chronic special needs plan for Diabetes.  Attempted to reach [name] via home number; no answer and HIPAA compliant message left. Plan forthirdoutreach call in one -two weeks.  Kelli Churn RN, CCM, [facility] Care Management [phone]

## 2019-03-20 ENCOUNTER — Encounter: Payer: Self-pay | Admitting: *Deleted

## 2019-03-20 ENCOUNTER — Other Ambulatory Visit: Payer: Self-pay | Admitting: *Deleted

## 2019-03-20 DIAGNOSIS — I2782 Chronic pulmonary embolism: Secondary | ICD-10-CM

## 2019-03-20 DIAGNOSIS — E1139 Type 2 diabetes mellitus with other diabetic ophthalmic complication: Secondary | ICD-10-CM

## 2019-03-20 DIAGNOSIS — Z794 Long term (current) use of insulin: Secondary | ICD-10-CM

## 2019-03-21 DIAGNOSIS — I2699 Other pulmonary embolism without acute cor pulmonale: Secondary | ICD-10-CM | POA: Insufficient documentation

## 2019-03-21 NOTE — Patient Outreach (Addendum)
[facility]) Care Management Chronic Special Needs Program  03/20/2019  Name: Matthew Zimmerman DOB: April 27, 1946  MRN: [id number]  [name] is enrolled in a chronic special needs plan for Diabetes. Chronic Care Management Coordinator telephoned client to review health risk assessment and to develop individualized care plan.  Introduced the chronic care management program, importance of client participation, and taking their care plan to all provider appointments and inpatient facilities.  Reviewed the transition of care process and possible referral to community care management.  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 In regards to his diabetes, he states he was diagnosed many years ago, in the 1980's and received the diagnosis when he was hospitalized at [facility] after he "fell out" due to a blood sugar of >500. He says his Hgb A1C is usually under 7.0% but he cannot recall his most recent value. He says he checks his blood sugar twice daily before he administer his 70/30 insulin, he says if his blood sugar is > 100 then he administers 35 units, and if it is under 100 then administers 25 units. He reports occasional  blood sugars in the 60's and says his hypoglycemic symptoms include weakness and mouth trembling. He says he is always able to self treat with a peanut butter sandwich and he always checks his CBG 1-2 hours after he treats his low blood sugar. He says he is unable to exercise due to chronic back and bilateral foot pain and would welcome a handout on chair exercises. He says he has difficulty trimming his toenails and is asking if he should go to a nail salon.  He says he does struggle at times affording his medications and is open to a referral to a Civil engineer, contracting  for medication assistance and a medication box.  He says he received the advance directive packet but he does not wish to complete it as he has told his wife his end of life wishes.  He says he is observing the Covid precautions of wearing a face mask when outside his home, 6 feet physical distancing and frequent hand washing . He says he is receptive to an Customer service manager on Covid 19.  He is asking about his health plan's benefits regarding: diabetic shoes:, podiatrist copays and in network podiatrist near his residence;  and power lift chair copay.  Goals Addressed            This Visit's Progress     Patient Stated   . "do some kind of exercise that doesn't involve standing on my feet" (pt-stated)      . "Keep my A1C under 7.0%" (pt-stated)      . Remain free of Covid 19 infection for rest of the year (pt-stated)        Other   .  Acknowledge receipt of Advanced Directive package   On track   . Client understands the importance of follow-up with providers by attending scheduled visits   On track   . Client will report abillity to obtain Medications within next 4-5 months      . Obtain annual  Lipid Profile, LDL-C   On track   . Obtain Annual Eye (retinal)  Exam    On track   . Obtain Hemoglobin A1C at least 2 times per  year   On track   . Visit Primary Care Provider or Endocrinologist at least 2 times per year    On track     Assessment:  Client is not meeting diabetes self-management goal of hemoglobin A1C of <8% with last reading of 7.8% on  with reports of occasional hypoglycemia .   Plan:   Send successful outreach letter with a copy of their individualized care plan,   Send individual care plan to provider     Send educational material on HTN, hypoglycemia, chair exercises and Covid 19 information  Will refer client to:  Pharmacy for financial assistance with medications and to his [place] for benefits questions related to diabetic  shoes:, podiatrist copays and  in network podiatrist near his residence;  and power lift chair copay.              Chronic care management coordination will outreach in: 9-12 months     Kelli Churn RN, CCM, [place] Management Coordinator [place] Management [phone]

## 2019-03-25 ENCOUNTER — Telehealth: Payer: Self-pay | Admitting: Pharmacist

## 2019-03-25 NOTE — Patient Outreach (Signed)
[facility]) Care Management  [place]   03/25/2019  Matthew Zimmerman 1946-02-13 [id number]  Reason for referral: Medication Assistance, Medication Review  Referral source: [employer] Care Manager with [facility] Current insurance: [employer]  PMHx includes but not limited to:   Type 2 diabetes, GERD, hypertension, history of pulmonary embolism, plantar fasciitis of right foot.  Outreach:  Successful telephone call with patient.  HIPAA identifiers verified.   Subjective:  Patient reported   Does the patient ever forget to take medication?  no Does the patient have problems obtaining medications due to transportation?   no Does the patient have problems obtaining medications due to cost?  no  Does the patient feel that medications prescribed are effective?  yes Does the patient ever experience any side effects to the medications prescribed?  no  Does the patient measure his/her own blood glucose at home?  Yes  81 mg/dl this morning  Does the patient measure his/her own blood pressure at home? No   Objective: The ASCVD Risk score Mikey Bussing DC Jr., et al., 2013) failed to calculate for the following reasons:   Cannot find a previous HDL lab   Cannot find a previous total cholesterol lab  Lab Results  Component Value Date   CREATININE 1.11 09/18/2014   CREATININE 1.12 05/20/2014   CREATININE 1.30 05/19/2014    Lab Results  Component Value Date   HGBA1C 7.2 (H) 05/20/2014    Lipid Panel  No results found for: CHOL, TRIG, HDL, CHOLHDL, VLDL, LDLCALC, LDLDIRECT  BP Readings from Last 3 Encounters:  08/29/18 (!) 161/76  04/03/16 128/78  12/22/15 126/64    Allergies  Allergen Reactions  . Cortisone Other (See Comments)    CBG thrown out of control    Medications Reviewed Today    Reviewed by Elayne Guerin, [facility] (Pharmacist) on 03/25/19 at 32  Med List Status: <None>  Medication Order Taking? Sig Documenting  Provider Last Dose Status Informant  aspirin EC 81 MG tablet [id number] Yes Take 81 mg by mouth daily. [provider] Taking Active Self  augmented betamethasone dipropionate (DIPROLENE-AF) 0.05 % cream [id number] Yes Apply to affected area daily as needed [provider] Taking Active   diclofenac sodium (VOLTAREN) 1 % GEL [id number] Yes Apply 1 application topically 4 (four) times daily as needed (pain). [provider] Taking Active Pharmacy Records  empagliflozin (JARDIANCE) 25 MG TABS tablet [id number] Yes Take 1 tablet on Wednesdays and Sundays [provider] Taking Active   gabapentin (NEURONTIN) 300 MG capsule [id number] Yes gabapentin 300 mg capsule  TK 1 C PO TID [provider] Taking Active            Med Note Broadus John, [name] Mar 20, 2019  4:03 PM) Says he stopped taking 3 months ago- ineffective  glucose blood (ONE TOUCH ULTRA TEST) test strip [id number] Yes Use to test blood sugar twice a day [provider] Taking Active   ibuprofen (IBU) 800 MG tablet [id number] Yes IBU 800 mg tablet [provider] Taking Active            Med Note Broadus John, Trude Mcburney   Thu Mar 20, 2019  4:04 PM) Takes at night for pain so he can sleep  insulin NPH-regular Human (NOVOLIN 70/30) (70-30) 100 UNIT/ML injection [id number] Yes Inject 25-35 Units into the skin 2 (two) times daily with a meal. Humulin [provider] Taking Active Pharmacy Records  Med Note Broadus John, Trude Mcburney   Fri Mar 21, 2019 10:04 AM) He says if his blood sugar is >100 then he administers the higher dose and if it is lower than 100 then he administers the lower dose  Lancets (ONETOUCH DELICA PLUS CHENID78E) [place] [id number] Yes Use to test blood sugar twice daily [provider] Taking Active   metFORMIN (GLUCOPHAGE) 500 MG tablet [id number] Yes Take 2 tablets by mouth twice daily [provider] Taking Active   rivaroxaban (XARELTO) 20 MG TABS  tablet [id number] Yes Take 20 mg by mouth daily with supper. [provider] Taking Active Self  rosuvastatin (CRESTOR) 10 MG tablet [id number] Yes TAKE 1 TABLET BY MOUTH EVERY DAY Miquel Dunn, NP Taking Active   sitaGLIPtin (JANUVIA) 100 MG tablet [id number] Yes Januvia 100 mg tablet [provider] Taking Active           Assessment: Drugs sorted by system:  Neurologic/Psychologic: Gabapentin  Cardiovascular:  Aspirin, Xarelto, Rosuvasatin  Endocrine: Januvia, Metformin, Novolin 70/30, Jardiance  Topical: Diclofenac gel, Augmented betamethasone dipropionate cream  Pain: Ibuprofen,  Infectious Diseases:  Medication Review Findings:  . Increased risk of bleeding with combination of aspirin/Xarelto/Ibuprofen . Referral stated patient may be taking two statins (atorvastatin and rosuvastatin).  After medication review, it was determined the patient is only taking rosuvastatin.  Medication Adherence Findings:  Patient with fair understanding of regimen and good understanding of indications.    Medication Assistance Findings:  Medication assistance needs identified: none  Extra Help:  Already receiving Full Extra Help Low Income Subsidy   Since patient is receiving Extra Help, he does not qualify for patient assistance programs.  However, with Extra Help, patient is able to get a 90 day supply of a chronic medication for the same copay as a 30 day supply (provided the prescription is written for a 90 day supply).  Patient reported only getting a 30 day supply of Xarelto.  Additional medication assistance options reviewed with patient as warranted:  No other options identified  Plan: . Will follow-up in 1 month.  . Route note to Dr. Maudie Mercury requesting prescriptions be written for a 90 day supply.  Elayne Guerin, PharmD, [place] Clinical Pharmacist [phone]

## 2019-04-04 ENCOUNTER — Ambulatory Visit: Payer: Self-pay | Admitting: Cardiology

## 2019-04-09 ENCOUNTER — Ambulatory Visit (INDEPENDENT_AMBULATORY_CARE_PROVIDER_SITE_OTHER): Payer: HMO | Admitting: Cardiology

## 2019-04-09 ENCOUNTER — Encounter: Payer: Self-pay | Admitting: Cardiology

## 2019-04-09 ENCOUNTER — Other Ambulatory Visit: Payer: Self-pay

## 2019-04-09 VITALS — BP 132/77 | HR 63 | Ht 68.8 in | Wt 190.7 lb

## 2019-04-09 DIAGNOSIS — I2510 Atherosclerotic heart disease of native coronary artery without angina pectoris: Secondary | ICD-10-CM | POA: Diagnosis not present

## 2019-04-09 DIAGNOSIS — I739 Peripheral vascular disease, unspecified: Secondary | ICD-10-CM

## 2019-04-09 DIAGNOSIS — N183 Type 2 diabetes mellitus with diabetic chronic kidney disease: Secondary | ICD-10-CM

## 2019-04-09 DIAGNOSIS — Z794 Long term (current) use of insulin: Secondary | ICD-10-CM

## 2019-04-09 DIAGNOSIS — E1122 Type 2 diabetes mellitus with diabetic chronic kidney disease: Secondary | ICD-10-CM | POA: Diagnosis not present

## 2019-04-09 DIAGNOSIS — I2699 Other pulmonary embolism without acute cor pulmonale: Secondary | ICD-10-CM | POA: Diagnosis not present

## 2019-04-09 DIAGNOSIS — I10 Essential (primary) hypertension: Secondary | ICD-10-CM

## 2019-04-09 DIAGNOSIS — I129 Hypertensive chronic kidney disease with stage 1 through stage 4 chronic kidney disease, or unspecified chronic kidney disease: Secondary | ICD-10-CM | POA: Diagnosis not present

## 2019-04-09 MED ORDER — ATORVASTATIN CALCIUM 20 MG PO TABS: 20.0000 mg | ORAL_TABLET | Freq: Every day | ORAL | 3 refills | Status: DC

## 2019-04-09 NOTE — Progress Notes (Signed)
Primary Physician:  Jani Gravel, MD   Patient ID: Matthew Zimmerman, male    DOB: 1945/11/25, 73 y.o.   MRN: [id number]  Subjective:    Chief Complaint  Patient presents with   Coronary Artery Disease    6 month f/u, pt has a blood clot in lung   Claudication    HPI: Matthew Zimmerman  is a 73 y.o. male  with hypertension, type 2 DM, hyperlipidemia, former tobacco use, and previous PE in 2014, coronary artery calcification by CT scan, with intermediate stress test in August 2019, medical management has been preferred.  Patient reports 4-5 weeks ago he was found to have right lung PE. He has been managed outpatient and is now in [place]. Patient reports he does not have DVT. He has previously had a PE several years ago. He continues to have fatigue and shortness of breath; however, has improved since starting therapy for PE.  Denies any PND, or orthopnea. Does have occasional sharp chest pain  He has also been found to have nocturnal hypoxemia, but is currently not on oxygen. He continues to have stable claudication symptoms. Has abnormal ABI, but no significant stenosis by LE arterial duplex.    Past Medical History:  Diagnosis Date   Carpal tunnel syndrome on both sides    Chest pain    Diabetes mellitus without complication (HCC)    GERD (gastroesophageal reflux disease)    Headache    Hypercholesteremia    Hyperlipidemia    PE (pulmonary embolism)    Shortness of breath dyspnea    PE     Past Surgical History:  Procedure Laterality Date   BILATERAL CARPAL TUNNEL RELEASE Bilateral    COLONOSCOPY     ELBOW SURGERY     BONE SPURS REMOVED B ELBOWS   EYE SURGERY Bilateral    cataracts   FOOT SURGERY     HEEL SPURS REMOVED B HEELS   I&D EXTREMITY Right 09/18/2014   Procedure: IRRIGATION AND DEBRIDEMENT RIGHT THUMB, open fracture repair;  Surgeon: Linna Hoff, MD;  Location: [facility];  Service: Orthopedics;  Laterality: Right;   LEG SURGERY Left    left  thigh table saw accident, "cut to the bone", I&D/suturing   SHOULDER SURGERY Left    Bone spur   SPINE SURGERY  09/04/1985   lumbar spine   TOE SURGERY      Social History   Socioeconomic History   Marital status: Married    Spouse name: Not on file   Number of children: Not on file   Years of education: Not on file   Highest education level: Not on file  Occupational History   Not on file  Social Needs   Financial resource strain: Not on file   Food insecurity    Worry: Not on file    Inability: Not on file   Transportation needs    Medical: Not on file    Non-medical: Not on file  Tobacco Use   Smoking status: Former Smoker   Smokeless tobacco: Former Systems developer  Substance and Sexual Activity   Alcohol use: No   Drug use: No   Sexual activity: Not on file  Lifestyle   Physical activity    Days per week: 0 days    Minutes per session: 0 min   Stress: Only a little  Relationships   Social connections    Talks on phone: More than three times a week    Gets together: Not on file  Attends religious service: Not on file    Active member of club or organization: Not on file    Attends meetings of clubs or organizations: Not on file    Relationship status: Married   Intimate partner violence    Fear of current or ex partner: Not on file    Emotionally abused: Not on file    Physically abused: Not on file    Forced sexual activity: Not on file  Other Topics Concern   Not on file  Social History Narrative   Lives with wife.[name]   He is retired from [employer], also Waylan Zimmerman son age 56 years old died suddenly at client's home on 11/03/18- results of autopsy still pending on 03/20/19    Review of Systems  Constitution: Positive for malaise/fatigue. Negative for decreased appetite, weight gain and weight loss.  Eyes: Negative for visual disturbance.  Cardiovascular: Positive for chest pain. Negative for claudication, dyspnea on exertion, leg  swelling, orthopnea, palpitations and syncope.  Respiratory: Positive for shortness of breath. Negative for hemoptysis and wheezing.   Endocrine: Negative for cold intolerance and heat intolerance.  Hematologic/Lymphatic: Does not bruise/bleed easily.  Skin: Negative for nail changes.  Musculoskeletal: Negative for muscle weakness and myalgias.  Gastrointestinal: Negative for abdominal pain, change in bowel habit, nausea and vomiting.  Neurological: Negative for difficulty with concentration, dizziness, focal weakness and headaches.  Psychiatric/Behavioral: Negative for altered mental status and suicidal ideas.  All other systems reviewed and are negative.     Objective:  Blood pressure 132/77, pulse 63, height 5' 8.8" (1.748 m), weight 190 lb 11.2 oz (86.5 kg), SpO2 97 %. Body mass index is 28.33 kg/m.    Physical Exam  Constitutional: He is oriented to person, place, and time. Vital signs are normal. He appears well-developed and well-nourished.  HENT:  Head: Normocephalic and atraumatic.  Neck: Normal range of motion.  Cardiovascular: Normal rate, regular rhythm, normal heart sounds and intact distal pulses.  Pulses:      Femoral pulses are 2+ on the right side and 2+ on the left side.      Popliteal pulses are 1+ on the right side and 1+ on the left side.       Dorsalis pedis pulses are 2+ on the right side and 2+ on the left side.       Posterior tibial pulses are 0 on the right side and 0 on the left side.  Pulmonary/Chest: Effort normal and breath sounds normal. No accessory muscle usage. No respiratory distress.  Abdominal: Soft. Bowel sounds are normal.  Musculoskeletal: Normal range of motion.  Neurological: He is alert and oriented to person, place, and time.  Skin: Skin is warm and dry.  Vitals reviewed.  Radiology:  CTA of chest 02/25/2019: Small pulmonary embolus in right lower lobe pulmonary arterial branches.   CT of abdomen 01/16/2018:  1. No acute findings are  noted in the abdomen to account for the patient's symptoms. Specifically, no cholelithiasis and no evidence of acute cholecystitis at this time. 2. Aortic atherosclerosis, in addition to least 2 vessel coronary artery disease. Assessment for potential risk factor modification, dietary therapy or pharmacologic therapy may be warranted, if clinically indicated. 3. There are calcifications of the aortic valve. Echocardiographiccorrelation for evaluation of potential valvular dysfunction may be warranted if clinically indicated. 4. Cardiomegaly.  Laboratory examination:    CMP Latest Ref Rng & Units 09/18/2014 05/20/2014 05/19/2014  Glucose 70 - 99 mg/dL 82 143(H) 48(L)  BUN 6 - 23 mg/dL 11 14 16   Creatinine 0.50 - 1.35 mg/dL 1.11 1.12 1.30  Sodium 135 - 145 mmol/L 141 135(L) 140  Potassium 3.5 - 5.1 mmol/L 4.1 4.0 4.2  Chloride 96 - 112 mEq/L 106 97 102  CO2 19 - 32 mmol/L 29 27 -  Calcium 8.4 - 10.5 mg/dL 9.4 8.9 -  Total Protein 6.0 - 8.3 g/dL - - -  Total Bilirubin 0.3 - 1.2 mg/dL - - -  Alkaline Phos 39 - 117 units/L - - -  AST 0 - 37 units/L - - -  ALT 0 - 53 units/L - - -   CBC Latest Ref Rng & Units 09/18/2014 05/20/2014 05/19/2014  WBC 4.0 - 10.5 K/uL 5.8 8.3 -  Hemoglobin 13.0 - 17.0 g/dL 10.6(L) 11.8(L) 14.6  Hematocrit 39.0 - 52.0 % 34.2(L) 36.2(L) 43.0  Platelets 150 - 400 K/uL 385 279 -   Lipid Panel  No results found for: CHOL, TRIG, HDL, CHOLHDL, VLDL, LDLCALC, LDLDIRECT HEMOGLOBIN A1C Lab Results  Component Value Date   HGBA1C 7.2 (H) 05/20/2014   MPG 160 (H) 05/20/2014   TSH No results for input(s): TSH in the last 8760 hours.  PRN Meds:. There are no discontinued medications. Current Meds  Medication Sig   aspirin EC 81 MG tablet Take 81 mg by mouth daily.   empagliflozin (JARDIANCE) 25 MG TABS tablet Take 1 tablet on Wednesdays and Sundays   gabapentin (NEURONTIN) 300 MG capsule gabapentin 300 mg capsule  TK 1 C PO TID   insulin NPH-regular Human  (NOVOLIN 70/30) (70-30) 100 UNIT/ML injection Inject 25-35 Units into the skin 2 (two) times daily with a meal. Humulin   Lancets (ONETOUCH DELICA PLUS [id number]) MISC Use to test blood sugar twice daily   metFORMIN (GLUCOPHAGE) 500 MG tablet Take 2 tablets by mouth twice daily   rivaroxaban (XARELTO) 20 MG TABS tablet Take 20 mg by mouth daily with supper.   sitaGLIPtin (JANUVIA) 100 MG tablet Januvia 100 mg tablet    Cardiac Studies:   Nocturnal oximetry 05/30/2018: O2 sat < 87% 2.7 minutes, < 89% 6.7 minutes, highest SpO2 99, lowest 77%.  Oxygen desaturation events 3%, Oxygen desaturation index is 16.  Qualifies for oxygen supplementation by Medicare guidelines group II.  Echocardiogram 04/30/2018: Left ventricle cavity is normal in size. Mild concentric hypertrophy of the left ventricle. Normal global wall motion. Calculated EF 65%. Mild aortic valve leaflet calcification. Inadequate tricuspid regurgitation jet to estimate pulmonary artery pressure. Normal right atrial pressure. No significant change compared to previous study in 2017.  Lexiscan myoview stress test 03/25/2018: 1. Lexiscan stress test was performed. Exercise capacity was not assessed. No stress symptoms reported. Peak effect blood pressure was 82/56 mmHg. The resting and stress electrocardiogram demonstrated sinus bradycardia, RBBB, no resting arrhythmias and normal rest repolarization. Stress EKG is non diagnostic for ischemia as it is a pharmacologic stress. 2. The overall quality of the study is good. Left ventricular cavity is noted to be normal on the rest and stress studies. Gated SPECT images reveal normal myocardial thickening and wall motion. The left ventricular ejection fraction was calculated to be 42%, although visually appears normal. REST and STRESS images demonstrate decreased tracer uptake in the basal inferior, basal inferolateral and mid inferolateral segments of the left ventricle. While defect  could be related to tissue attenuation, ischemia cannot be excluded. 3. Intermediate risk study.  Lower extremity arterial duplex 08/12/2018: No hemodynamically significant stenoses are identified in the lower  extremity arterial system. Non-compressible ABI on both legs, suggestive of medial calcinosis. Moderately abnormal waveforms of the ankle. Diffuse plaque noted especially below knee vessels.  Sleep study Dr. Brett Fairy 04/03/2016: Sleep study very mild sleep apnea, resolved with nonsupine sleeping position.  Assessment:     ICD-10-CM   1. Pulmonary embolism on right (HCC)  I26.99 PCV ECHOCARDIOGRAM COMPLETE  2. Claudication in peripheral vascular disease (HCC)  I73.9 EKG 12-Lead  3. Coronary artery calcification seen on CT scan  I25.10 EKG 12-Lead  4. Essential hypertension  I10   5. Controlled type 2 diabetes mellitus with stage 3 chronic kidney disease, with long-term current use of insulin ([place])  E11.22    N18.3    Z79.4     EKG 04/09/2019: Sinus bradycardia at 56 bpm, normal axis, RBBB. 1 PVC. Abnormal EKG  Recommendations:   Patient is overall doing well, he was found to have small right PE. He is still having some shortness of breath, fatigue, and occasional sharp chest pain, but better than before. I will obtain urgent echo for evaluation for RV strain. No evidence of decompensated heart failure. Blood pressure is stable. No changes were made to medications today. He will require life long anticoagulation. If he has not had evaluation for clotting disorder, would consider this.   I have started Lipitor as he stopped Crestor due to myalgia. He will need statin in view of coronary calcification and PAD. Claudication has been stable. He reports diabetes is well controlled. I will see him back in the next few weeks for follow up and to discuss echo results.  Miquel Dunn, MSN, APRN, FNP-C [facility]. [place] Office: [phone] Fax: [phone]

## 2019-04-21 ENCOUNTER — Other Ambulatory Visit: Payer: Self-pay | Admitting: Pharmacist

## 2019-04-21 NOTE — Patient Outreach (Addendum)
[facility]) Care Management  04/21/2019  Andras Grunewald 1946-08-01 [id number]   Called patient to see if he was able to get a 90 day supply of Xarelto as was requested from his provider. Unfortunately, he did not answer his phone. HIPAA compliant message was left on his voicemail.    Review of fill history does not reflect a new prescription being sent in for a 90 day supply as Xarelto was recently filled for a 30 day supply.  Plan: Send patient an unsuccessful contact letter. Call patient back in 10-14 business days.  Elayne Guerin, PharmD, [place] Clinical Pharmacist [phone]

## 2019-04-22 ENCOUNTER — Ambulatory Visit: Payer: Self-pay | Admitting: Pharmacist

## 2019-04-22 DIAGNOSIS — H43811 Vitreous degeneration, right eye: Secondary | ICD-10-CM | POA: Diagnosis not present

## 2019-04-22 DIAGNOSIS — H43812 Vitreous degeneration, left eye: Secondary | ICD-10-CM | POA: Diagnosis not present

## 2019-04-22 DIAGNOSIS — E113411 Type 2 diabetes mellitus with severe nonproliferative diabetic retinopathy with macular edema, right eye: Secondary | ICD-10-CM | POA: Diagnosis not present

## 2019-04-22 DIAGNOSIS — E113412 Type 2 diabetes mellitus with severe nonproliferative diabetic retinopathy with macular edema, left eye: Secondary | ICD-10-CM | POA: Diagnosis not present

## 2019-05-05 ENCOUNTER — Other Ambulatory Visit: Payer: Self-pay

## 2019-05-05 ENCOUNTER — Ambulatory Visit (INDEPENDENT_AMBULATORY_CARE_PROVIDER_SITE_OTHER): Payer: HMO

## 2019-05-05 DIAGNOSIS — I10 Essential (primary) hypertension: Secondary | ICD-10-CM | POA: Diagnosis not present

## 2019-05-05 DIAGNOSIS — I2699 Other pulmonary embolism without acute cor pulmonale: Secondary | ICD-10-CM

## 2019-05-08 ENCOUNTER — Ambulatory Visit: Payer: Self-pay | Admitting: Pharmacist

## 2019-05-08 ENCOUNTER — Other Ambulatory Visit: Payer: Self-pay | Admitting: Pharmacist

## 2019-05-08 NOTE — Patient Outreach (Signed)
[facility]) Care Management  05/08/2019  Matthew Zimmerman 07-07-1946 [id number]   Patient was called to follow up on 90-day supply refills.  Unfortunately, he did not answer the phone. HIPAA compliant message was left on his voicemail.    Reviewed [facility] fill history and the patient has not had a 90 day supply of Xarelto filled (he has had multiple 30 day fills).  Plan: Call patient back in 1 month. Sent patient an unsuccessful Economist.  Elayne Guerin, PharmD, [place] Clinical Pharmacist [phone]

## 2019-05-13 ENCOUNTER — Ambulatory Visit: Payer: HMO | Admitting: Cardiology

## 2019-05-21 ENCOUNTER — Other Ambulatory Visit: Payer: Self-pay

## 2019-05-21 ENCOUNTER — Ambulatory Visit (INDEPENDENT_AMBULATORY_CARE_PROVIDER_SITE_OTHER): Payer: HMO | Admitting: Cardiology

## 2019-05-21 ENCOUNTER — Encounter: Payer: Self-pay | Admitting: Cardiology

## 2019-05-21 VITALS — BP 144/63 | HR 72 | Ht 68.0 in | Wt 197.0 lb

## 2019-05-21 DIAGNOSIS — I2699 Other pulmonary embolism without acute cor pulmonale: Secondary | ICD-10-CM | POA: Diagnosis not present

## 2019-05-21 DIAGNOSIS — I10 Essential (primary) hypertension: Secondary | ICD-10-CM

## 2019-05-21 DIAGNOSIS — I739 Peripheral vascular disease, unspecified: Secondary | ICD-10-CM

## 2019-05-21 DIAGNOSIS — I2510 Atherosclerotic heart disease of native coronary artery without angina pectoris: Secondary | ICD-10-CM

## 2019-05-21 MED ORDER — METOPROLOL SUCCINATE ER 25 MG PO TB24: 25.0000 mg | ORAL_TABLET | Freq: Every day | ORAL | 1 refills | Status: DC

## 2019-05-21 NOTE — Progress Notes (Signed)
Primary Physician:  Jani Gravel, MD   Patient ID: Matthew Zimmerman, male    DOB: 07/23/1946, 73 y.o.   MRN: [id number]  Subjective:    Chief Complaint  Patient presents with  . Shortness of Breath  . Results    echo results    HPI: Matthew Zimmerman  is a 73 y.o. male  with hypertension, type 2 DM, hyperlipidemia, former tobacco use, and previous PE in 2014, coronary artery calcification by CT scan, with intermediate stress test in August 2019, medical management has been preferred.  Patient reports 4-5 weeks ago he was found to have right lung PE. He has been managed outpatient and is now in [place]. Patient reports he does not have DVT. He has previously had a PE several years ago. He underwent echocardiogram on 05/05/2019 and now presents for follow up.  He reports improvement in shortness of breath over the last several weeks. No chest pain. States that he is feeling much better.  Denies any PND, or orthopnea. Claudication has improved with stopping gabapentin. Has abnormal ABI, but no significant stenosis by LE arterial duplex.   He was started on Lipitor at his last office visit, and is actually still also taking Crestor by mistake.   He has also been found to have nocturnal hypoxemia, but is currently not on oxygen.   Past Medical History:  Diagnosis Date  . Carpal tunnel syndrome on both sides   . Chest pain   . Diabetes mellitus without complication ([place])   . GERD (gastroesophageal reflux disease)   . Headache   . Hypercholesteremia   . Hyperlipidemia   . PE (pulmonary embolism)   . Shortness of breath dyspnea    PE     Past Surgical History:  Procedure Laterality Date  . BILATERAL CARPAL TUNNEL RELEASE Bilateral   . COLONOSCOPY    . ELBOW SURGERY     BONE SPURS REMOVED B ELBOWS  . EYE SURGERY Bilateral    cataracts  . FOOT SURGERY     HEEL SPURS REMOVED B HEELS  . I&D EXTREMITY Right 09/18/2014   Procedure: IRRIGATION AND DEBRIDEMENT RIGHT THUMB, open  fracture repair;  Surgeon: Linna Hoff, MD;  Location: [facility];  Service: Orthopedics;  Laterality: Right;  . LEG SURGERY Left    left thigh table saw accident, "cut to the bone", I&D/suturing  . SHOULDER SURGERY Left    Bone spur  . SPINE SURGERY  09/04/1985   lumbar spine  . TOE SURGERY      Social History   Socioeconomic History  . Marital status: Married    Spouse name: Not on file  . Number of children: 2  . Years of education: Not on file  . Highest education level: Not on file  Occupational History  . Not on file  Social Needs  . Financial resource strain: Not on file  . Food insecurity    Worry: Not on file    Inability: Not on file  . Transportation needs    Medical: Not on file    Non-medical: Not on file  Tobacco Use  . Smoking status: Former Smoker    Packs/day: 0.50    Types: Cigarettes    Quit date: 1964    Years since quitting: 56.7  . Smokeless tobacco: Former Systems developer  . Tobacco comment: smoking since 73 years old  Substance and Sexual Activity  . Alcohol use: No  . Drug use: No  . Sexual activity: Not on file  Lifestyle  . Physical activity    Days per week: 0 days    Minutes per session: 0 min  . Stress: Only a little  Relationships  . Social connections    Talks on phone: More than three times a week    Gets together: Not on file    Attends religious service: Not on file    Active member of club or organization: Not on file    Attends meetings of clubs or organizations: Not on file    Relationship status: Married  . Intimate partner violence    Fear of current or ex partner: Not on file    Emotionally abused: Not on file    Physically abused: Not on file    Forced sexual activity: Not on file  Other Topics Concern  . Not on file  Social History Narrative   Lives with wife.[name]   He is retired from [employer], also Waylan Boga son age 32 years old died suddenly at client's home on 11/03/18- results of autopsy still pending on 03/20/19     Review of Systems  Constitution: Negative for decreased appetite, malaise/fatigue, weight gain and weight loss.  Eyes: Negative for visual disturbance.  Cardiovascular: Negative for chest pain, claudication, dyspnea on exertion, leg swelling, orthopnea, palpitations and syncope.  Respiratory: Negative for hemoptysis, shortness of breath and wheezing.   Endocrine: Negative for cold intolerance and heat intolerance.  Hematologic/Lymphatic: Does not bruise/bleed easily.  Skin: Negative for nail changes.  Musculoskeletal: Negative for muscle weakness and myalgias.  Gastrointestinal: Negative for abdominal pain, change in bowel habit, nausea and vomiting.  Neurological: Negative for difficulty with concentration, dizziness, focal weakness and headaches.  Psychiatric/Behavioral: Negative for altered mental status and suicidal ideas.  All other systems reviewed and are negative.     Objective:  Blood pressure (!) 144/63, pulse 72, height 5\' 8"  (1.727 m), weight 197 lb (89.4 kg), SpO2 100 %. Body mass index is 29.95 kg/m.    Physical Exam  Constitutional: He is oriented to person, place, and time. Vital signs are normal. He appears well-developed and well-nourished.  HENT:  Head: Normocephalic and atraumatic.  Neck: Normal range of motion.  Cardiovascular: Normal rate, regular rhythm, normal heart sounds and intact distal pulses.  Pulses:      Femoral pulses are 2+ on the right side and 2+ on the left side.      Popliteal pulses are 2+ on the right side and 2+ on the left side.       Dorsalis pedis pulses are 2+ on the right side and 2+ on the left side.       Posterior tibial pulses are 0 on the right side and 0 on the left side.  Pulmonary/Chest: Effort normal and breath sounds normal. No accessory muscle usage. No respiratory distress.  Abdominal: Soft. Bowel sounds are normal.  Musculoskeletal: Normal range of motion.  Neurological: He is alert and oriented to person, place, and  time.  Skin: Skin is warm and dry.  Vitals reviewed.  Radiology:  Echocardiogram 05/05/2019: Left ventricle cavity is normal in size. Mild concentric hypertrophy of the left ventricle. Normal LV systolic function with EF 55%. Normal global wall motion. Doppler evidence of grade I (impaired) diastolic dysfunction, normal LAP.  Aneurysmal interatrial septum without 2D or color Doppler evidence of interatrial shunt. No significant valvular abnormalities. Normal right atrial pressure.   CTA of chest 02/25/2019: Small pulmonary embolus in right lower lobe pulmonary arterial branches.  CT of abdomen 01/16/2018:  1. No acute findings are noted in the abdomen to account for the patient's symptoms. Specifically, no cholelithiasis and no evidence of acute cholecystitis at this time. 2. Aortic atherosclerosis, in addition to least 2 vessel coronary artery disease. Assessment for potential risk factor modification, dietary therapy or pharmacologic therapy may be warranted, if clinically indicated. 3. There are calcifications of the aortic valve. Echocardiographiccorrelation for evaluation of potential valvular dysfunction may be warranted if clinically indicated. 4. Cardiomegaly.  Laboratory examination:    CMP Latest Ref Rng & Units 09/18/2014 05/20/2014 05/19/2014  Glucose 70 - 99 mg/dL 82 143(H) 48(L)  BUN 6 - 23 mg/dL 11 14 16   Creatinine 0.50 - 1.35 mg/dL 1.11 1.12 1.30  Sodium 135 - 145 mmol/L 141 135(L) 140  Potassium 3.5 - 5.1 mmol/L 4.1 4.0 4.2  Chloride 96 - 112 mEq/L 106 97 102  CO2 19 - 32 mmol/L 29 27 -  Calcium 8.4 - 10.5 mg/dL 9.4 8.9 -  Total Protein 6.0 - 8.3 g/dL - - -  Total Bilirubin 0.3 - 1.2 mg/dL - - -  Alkaline Phos 39 - 117 units/L - - -  AST 0 - 37 units/L - - -  ALT 0 - 53 units/L - - -   CBC Latest Ref Rng & Units 09/18/2014 05/20/2014 05/19/2014  WBC 4.0 - 10.5 K/uL 5.8 8.3 -  Hemoglobin 13.0 - 17.0 g/dL 10.6(L) 11.8(L) 14.6  Hematocrit 39.0 - 52.0 % 34.2(L)  36.2(L) 43.0  Platelets 150 - 400 K/uL 385 279 -   Lipid Panel  No results found for: CHOL, TRIG, HDL, CHOLHDL, VLDL, LDLCALC, LDLDIRECT HEMOGLOBIN A1C Lab Results  Component Value Date   HGBA1C 7.2 (H) 05/20/2014   MPG 160 (H) 05/20/2014   TSH No results for input(s): TSH in the last 8760 hours.  PRN Meds:. Medications Discontinued During This Encounter  Medication Reason  . empagliflozin (JARDIANCE) 25 MG TABS tablet Error  . gabapentin (NEURONTIN) 300 MG capsule Error  . sitaGLIPtin (JANUVIA) 100 MG tablet Error  . atorvastatin (LIPITOR) 20 MG tablet Discontinued by provider   Current Meds  Medication Sig  . aspirin EC 81 MG tablet Take 81 mg by mouth daily.  [name] augmented betamethasone dipropionate (DIPROLENE-AF) 0.05 % cream Apply to affected area daily as needed  . diclofenac sodium (VOLTAREN) 1 % GEL Apply 1 application topically 4 (four) times daily as needed (pain).  [name] ibuprofen (IBU) 800 MG tablet IBU 800 mg tablet  . insulin NPH-regular Human (NOVOLIN 70/30) (70-30) 100 UNIT/ML injection Inject 25-35 Units into the skin 2 (two) times daily with a meal. Humulin  . metFORMIN (GLUCOPHAGE) 500 MG tablet Take 2 tablets by mouth twice daily  . rivaroxaban (XARELTO) 20 MG TABS tablet Take 20 mg by mouth daily with supper.  . rosuvastatin (CRESTOR) 10 MG tablet Take 10 mg by mouth daily.  . [DISCONTINUED] atorvastatin (LIPITOR) 20 MG tablet Take 1 tablet (20 mg total) by mouth daily.    Cardiac Studies:   Nocturnal oximetry 05/30/2018: O2 sat < 87% 2.7 minutes, < 89% 6.7 minutes, highest SpO2 99, lowest 77%.  Oxygen desaturation events 3%, Oxygen desaturation index is 16.  Qualifies for oxygen supplementation by Medicare guidelines group II.  Echocardiogram 05/05/2019: Left ventricle cavity is normal in size. Mild concentric hypertrophy of the left ventricle. Normal LV systolic function with EF 55%. Normal global wall motion. Doppler evidence of grade I (impaired) diastolic  dysfunction, normal LAP.  Aneurysmal interatrial  septum without 2D or color Doppler evidence of interatrial shunt. No significant valvular abnormalities. Normal right atrial pressure.   Lexiscan myoview stress test 03/25/2018: 1. Lexiscan stress test was performed. Exercise capacity was not assessed. No stress symptoms reported. Peak effect blood pressure was 82/56 mmHg. The resting and stress electrocardiogram demonstrated sinus bradycardia, RBBB, no resting arrhythmias and normal rest repolarization. Stress EKG is non diagnostic for ischemia as it is a pharmacologic stress. 2. The overall quality of the study is good. Left ventricular cavity is noted to be normal on the rest and stress studies. Gated SPECT images reveal normal myocardial thickening and wall motion. The left ventricular ejection fraction was calculated to be 42%, although visually appears normal. REST and STRESS images demonstrate decreased tracer uptake in the basal inferior, basal inferolateral and mid inferolateral segments of the left ventricle. While defect could be related to tissue attenuation, ischemia cannot be excluded. 3. Intermediate risk study.  Lower extremity arterial duplex 08/12/2018: No hemodynamically significant stenoses are identified in the lower extremity arterial system. Non-compressible ABI on both legs, suggestive of medial calcinosis. Moderately abnormal waveforms of the ankle. Diffuse plaque noted especially below knee vessels.  Sleep study Dr. Brett Fairy 04/03/2016: Sleep study very mild sleep apnea, resolved with nonsupine sleeping position.  Assessment:     ICD-10-CM   1. Pulmonary embolism on right (HCC)  I26.99   2. Coronary artery calcification seen on CT scan  I25.10   3. Essential hypertension  I10   4. Claudication in peripheral vascular disease ([place])  I73.9     EKG 04/09/2019: Sinus bradycardia at 56 bpm, normal axis, RBBB. 1 PVC. Abnormal EKG  Recommendations:   Patient has  had continued improvement in chest pain or shortness of breath with treatment of PE with Xarelto.  I discussed recently obtained echocardiogram, no evidence of RV failure.  Has normal LVEF.  He is without symptoms of angina or clinical evidence of heart failure.  He was previously on metoprolol as well as lisinopril.  His blood pressure is stable without current medications, but in view of his coronary calcification and intermediate risk stress test, I will restart metoprolol.  He previously had bradycardia, but heart rate is stable today.  Advised him to start half a tablet of metoprolol 25 mg daily until he can be reevaluated by me.  His symptoms of claudication have improved and he feels were contributed by gabapentin.  Vascular exam is remained stable.  He is to see his PCP in 2 weeks for follow-up and will have labs also performed at that time.  I would like to see him back in 4 weeks for follow-up on hypertension and to reevaluate his heart rate with metoprolol.  Miquel Dunn, MSN, APRN, FNP-C [facility]. [place] Office: [phone] Fax: [phone]

## 2019-05-23 ENCOUNTER — Other Ambulatory Visit: Payer: Self-pay

## 2019-05-23 MED ORDER — METOPROLOL SUCCINATE ER 25 MG PO TB24: 25.0000 mg | ORAL_TABLET | Freq: Every day | ORAL | 1 refills | Status: DC

## 2019-05-30 DIAGNOSIS — E118 Type 2 diabetes mellitus with unspecified complications: Secondary | ICD-10-CM | POA: Diagnosis not present

## 2019-05-30 DIAGNOSIS — Z7189 Other specified counseling: Secondary | ICD-10-CM | POA: Diagnosis not present

## 2019-05-30 DIAGNOSIS — I10 Essential (primary) hypertension: Secondary | ICD-10-CM | POA: Diagnosis not present

## 2019-05-30 DIAGNOSIS — E789 Disorder of lipoprotein metabolism, unspecified: Secondary | ICD-10-CM | POA: Diagnosis not present

## 2019-06-09 ENCOUNTER — Other Ambulatory Visit: Payer: Self-pay | Admitting: Pharmacist

## 2019-06-09 NOTE — Patient Outreach (Addendum)
[facility] [facility]) Care Management  06/09/2019  Somtochukwu Hiracheta [birth year] [id number]  Patient was called regarding medication assistance and adherence. HIPAA identifiers were obtained.   Patient is a 73 year old male with multiple medical conditions including but not limited to:  Type 2 diabetes, GERD, hypertension and history PE.  He saw cardiology on 05/21/2019. Metoprolol was restarted during that visit (12.5mg  1 tablet daily). In addition, it was discovered the patient was taking both atorvastatin and rosuvastatin.  Patient said he discontinued rosuvastatin after the visit.  Patient also said Dr. Maudie Mercury started him on Farxiga 5 mg 1 tablet daily. (Added to medication list)  HgA1c 7.8%   Patient's medications were reviewed:   Medications Reviewed Today    Reviewed by Elayne Guerin, [facility] (Pharmacist) on 06/09/19 at [place] List Status: <None>  Medication Order Taking? Sig Documenting Provider Last Dose Status Informant  aspirin EC 81 MG tablet [id number] Yes Take 81 mg by mouth daily. [provider] Taking Active Self  atorvastatin (LIPITOR) 20 MG tablet [id number] Yes Take 20 mg by mouth daily. [provider] Taking Active   augmented betamethasone dipropionate (DIPROLENE-AF) 0.05 % cream [id number] Yes Apply to affected area daily as needed [provider] Taking Active   dapagliflozin propanediol (FARXIGA) 5 MG TABS tablet [id number] Yes Take 5 mg by mouth daily. Jani Gravel, MD Taking Active   diclofenac sodium (VOLTAREN) 1 % GEL [id number] Yes Apply 1 application topically 4 (four) times daily as needed (pain). [provider] Taking Active Pharmacy Records  glucose blood (ONE TOUCH ULTRA TEST) test strip [id number] Yes Use to test blood sugar twice a day [provider] Taking Active   ibuprofen (IBU) 800 MG tablet [id number] Yes IBU 800 mg tablet [provider] Taking Active            Med Note Broadus John, Trude Mcburney   Thu  Mar 20, 2019  4:04 PM) Takes at night for pain so he can sleep  insulin NPH-regular Human (NOVOLIN 70/30) (70-30) 100 UNIT/ML injection [id number] Yes Inject 25-35 Units into the skin 2 (two) times daily with a meal. Humulin [provider] Taking Active Pharmacy Records           Med Note Broadus John, Trude Mcburney   Fri Mar 21, 2019 10:04 AM) He says if his blood sugar is >100 then he administers the higher dose and if it is lower than 100 then he administers the lower dose  Lancets (ONETOUCH DELICA PLUS [id number]) [place] [id number] Yes Use to test blood sugar twice daily [provider] Taking Active   metFORMIN (GLUCOPHAGE) 500 MG tablet [id number] Yes Take 2 tablets by mouth twice daily [provider] Taking Active   metoprolol succinate (TOPROL-XL) 25 MG 24 hr tablet [id number] Yes Take 1 tablet (25 mg total) by mouth daily. Take with or immediately following a meal. Take at night. Adrian Prows, MD Taking Active   rivaroxaban (XARELTO) 20 MG TABS tablet [id number] Yes Take 20 mg by mouth daily with supper. [provider] Taking Active Self        Discontinued 06/09/19 1026 (Discontinued by provider)           Patient would still like to have Xarelto filled for 90 days. (I will send a note to the Cardiology provider).  Plan: Follow up with patient in 3-4 weeks (about medication changes).  Elayne Guerin, PharmD, [place] Clinical Pharmacist [phone]

## 2019-06-17 ENCOUNTER — Other Ambulatory Visit: Payer: Self-pay

## 2019-06-17 MED ORDER — METOPROLOL SUCCINATE ER 25 MG PO TB24: 25.0000 mg | ORAL_TABLET | Freq: Every day | ORAL | 1 refills | Status: DC

## 2019-06-20 ENCOUNTER — Ambulatory Visit: Payer: HMO | Admitting: Cardiology

## 2019-06-26 ENCOUNTER — Ambulatory Visit: Payer: HMO | Admitting: Cardiology

## 2019-06-27 ENCOUNTER — Ambulatory Visit: Payer: Self-pay | Admitting: Pharmacist

## 2019-07-04 ENCOUNTER — Other Ambulatory Visit: Payer: Self-pay | Admitting: Pharmacist

## 2019-07-04 ENCOUNTER — Ambulatory Visit: Payer: Self-pay | Admitting: Pharmacist

## 2019-07-04 NOTE — Patient Outreach (Signed)
[place] [facility]) Care Management  07/04/2019  Matthew Zimmerman [birth year] [id number]   Patient was called for CSNP follow up. Unfortunately, he did not answer his phone. HIPAA compliant message was left on his voicemail.  Plan: Call patient back in 3 months.  Elayne Guerin, PharmD, [place] Clinical Pharmacist [phone]

## 2019-07-07 DIAGNOSIS — Z7189 Other specified counseling: Secondary | ICD-10-CM | POA: Diagnosis not present

## 2019-07-11 DIAGNOSIS — E118 Type 2 diabetes mellitus with unspecified complications: Secondary | ICD-10-CM | POA: Diagnosis not present

## 2019-07-11 DIAGNOSIS — I10 Essential (primary) hypertension: Secondary | ICD-10-CM | POA: Diagnosis not present

## 2019-07-11 DIAGNOSIS — R21 Rash and other nonspecific skin eruption: Secondary | ICD-10-CM | POA: Diagnosis not present

## 2019-07-11 DIAGNOSIS — E785 Hyperlipidemia, unspecified: Secondary | ICD-10-CM | POA: Diagnosis not present

## 2019-07-17 ENCOUNTER — Ambulatory Visit: Payer: Self-pay | Admitting: *Deleted

## 2019-07-22 DIAGNOSIS — E113412 Type 2 diabetes mellitus with severe nonproliferative diabetic retinopathy with macular edema, left eye: Secondary | ICD-10-CM | POA: Diagnosis not present

## 2019-07-22 DIAGNOSIS — E113411 Type 2 diabetes mellitus with severe nonproliferative diabetic retinopathy with macular edema, right eye: Secondary | ICD-10-CM | POA: Diagnosis not present

## 2019-07-22 DIAGNOSIS — H43811 Vitreous degeneration, right eye: Secondary | ICD-10-CM | POA: Diagnosis not present

## 2019-07-22 DIAGNOSIS — H43812 Vitreous degeneration, left eye: Secondary | ICD-10-CM | POA: Diagnosis not present

## 2019-08-07 DIAGNOSIS — E118 Type 2 diabetes mellitus with unspecified complications: Secondary | ICD-10-CM | POA: Diagnosis not present

## 2019-08-14 DIAGNOSIS — E118 Type 2 diabetes mellitus with unspecified complications: Secondary | ICD-10-CM | POA: Diagnosis not present

## 2019-08-14 DIAGNOSIS — I10 Essential (primary) hypertension: Secondary | ICD-10-CM | POA: Diagnosis not present

## 2019-08-14 DIAGNOSIS — R001 Bradycardia, unspecified: Secondary | ICD-10-CM | POA: Diagnosis not present

## 2019-08-15 ENCOUNTER — Encounter: Payer: Self-pay | Admitting: Cardiology

## 2019-08-19 ENCOUNTER — Ambulatory Visit: Payer: HMO | Admitting: *Deleted

## 2019-08-21 DIAGNOSIS — E113411 Type 2 diabetes mellitus with severe nonproliferative diabetic retinopathy with macular edema, right eye: Secondary | ICD-10-CM | POA: Diagnosis not present

## 2019-08-21 DIAGNOSIS — E113412 Type 2 diabetes mellitus with severe nonproliferative diabetic retinopathy with macular edema, left eye: Secondary | ICD-10-CM | POA: Diagnosis not present

## 2019-08-26 DIAGNOSIS — E113412 Type 2 diabetes mellitus with severe nonproliferative diabetic retinopathy with macular edema, left eye: Secondary | ICD-10-CM | POA: Diagnosis not present

## 2019-09-08 ENCOUNTER — Ambulatory Visit (INDEPENDENT_AMBULATORY_CARE_PROVIDER_SITE_OTHER): Payer: HMO | Admitting: Cardiology

## 2019-09-08 ENCOUNTER — Encounter: Payer: Self-pay | Admitting: Cardiology

## 2019-09-08 ENCOUNTER — Other Ambulatory Visit: Payer: Self-pay

## 2019-09-08 VITALS — BP 128/54 | HR 81 | Temp 97.9°F | Resp 16 | Ht 68.0 in | Wt 189.9 lb

## 2019-09-08 DIAGNOSIS — I10 Essential (primary) hypertension: Secondary | ICD-10-CM

## 2019-09-08 DIAGNOSIS — I2699 Other pulmonary embolism without acute cor pulmonale: Secondary | ICD-10-CM

## 2019-09-08 DIAGNOSIS — I739 Peripheral vascular disease, unspecified: Secondary | ICD-10-CM

## 2019-09-08 DIAGNOSIS — I2510 Atherosclerotic heart disease of native coronary artery without angina pectoris: Secondary | ICD-10-CM | POA: Diagnosis not present

## 2019-09-08 NOTE — Progress Notes (Signed)
Primary Physician:  Jani Gravel, MD   Patient ID: Matthew Zimmerman, male    DOB: [birth year], 74 y.o.   MRN: [id number]  Subjective:    Chief Complaint  Patient presents with   Hypertension    PE   Follow-up    50mo   HPI: Matthew Zimmerman is a 74y.o. male  with hypertension, type 2 DM, hyperlipidemia, former tobacco use, and previous PE in 2014 and again in July 2020 in the right lung, coronary artery calcification by CT scan, with intermediate stress test in August 2019, medical management has been preferred. Echocardiogram performed in August 2020 showed normal LVEF and no evidence of RV strain. He now presents for 4 week follow up on hypertension.  At his last visit, he was started on Metoprolol, which he is tolerating well. No complaints today.   Chest pain and dyspnea on exertion has resolved. Denies any PND, or orthopnea. Claudication has improved with stopping gabapentin. Has abnormal ABI, but no significant stenosis by LE arterial duplex.   He has also been found to have nocturnal hypoxemia, but is currently not on oxygen.   Past Medical History:  Diagnosis Date   Carpal tunnel syndrome on both sides    Chest pain    Diabetes mellitus without complication (HCC)    GERD (gastroesophageal reflux disease)    Headache    Hypercholesteremia    Hyperlipidemia    PE (pulmonary embolism)    Shortness of breath dyspnea    PE     Past Surgical History:  Procedure Laterality Date   BILATERAL CARPAL TUNNEL RELEASE Bilateral    COLONOSCOPY     ELBOW SURGERY     BONE SPURS REMOVED B ELBOWS   EYE SURGERY Bilateral    cataracts   FOOT SURGERY     HEEL SPURS REMOVED B HEELS   I & D EXTREMITY Right 09/18/2014   Procedure: IRRIGATION AND DEBRIDEMENT RIGHT THUMB, open fracture repair;  Surgeon: FLinna Hoff MD;  Location: [facility]  Service: Orthopedics;  Laterality: Right;   LEG SURGERY Left    left thigh table saw accident, "cut to the bone",  I&D/suturing   SHOULDER SURGERY Left    Bone spur   SPINE SURGERY  09/04/1985   lumbar spine   TOE SURGERY      Social History   Socioeconomic History   Marital status: Married    Spouse name: Not on file   Number of children: 2   Years of education: Not on file   Highest education level: Not on file  Occupational History   Not on file  Tobacco Use   Smoking status: Former Smoker    Packs/day: 0.50    Types: Cigarettes    Quit date: 1964    Years since quitting: 57.0   Smokeless tobacco: Former USystems developer  Tobacco comment: smoking since 74years old  Substance and Sexual Activity   Alcohol use: No   Drug use: No   Sexual activity: Not on file  Other Topics Concern   Not on file  Social History Narrative   Lives with wife.[name]  He is retired from [employer] also Matthew Zimmerman age [age]years old died suddenly at client's home on 11/03/18- results of autopsy still pending on 03/20/19   Social Determinants of Health   Financial Resource Strain:    Difficulty of Paying Living Expenses: Not on file  Food Insecurity:    Worried About  Running Out of Food in the Last Year: Not on file   Ran Out of Food in the Last Year: Not on file  Transportation Needs:    Lack of Transportation (Medical): Not on file   Lack of Transportation (Non-Medical): Not on file  Physical Activity: Inactive   Days of Exercise per Week: 0 days   Minutes of Exercise per Session: 0 min  Stress: No Stress Concern Present   Feeling of Stress : Only a little  Social Connections: Unknown   Frequency of Communication with Friends and Family: More than three times a week   Frequency of Social Gatherings with Friends and Family: Not on file   Attends Religious Services: Not on Electrical engineer or Organizations: Not on file   Attends Archivist Meetings: Not on file   Marital Status: Married  Human resources officer Violence:    Fear of Current or  Ex-Partner: Not on file   Emotionally Abused: Not on file   Physically Abused: Not on file   Sexually Abused: Not on file    Review of Systems  Constitution: Negative for decreased appetite, malaise/fatigue, weight gain and weight loss.  Eyes: Negative for visual disturbance.  Cardiovascular: Negative for chest pain, claudication, dyspnea on exertion, leg swelling, orthopnea, palpitations and syncope.  Respiratory: Negative for hemoptysis, shortness of breath and wheezing.   Endocrine: Negative for cold intolerance and heat intolerance.  Hematologic/Lymphatic: Does not bruise/bleed easily.  Skin: Negative for nail changes.  Musculoskeletal: Negative for muscle weakness and myalgias.  Gastrointestinal: Negative for abdominal pain, change in bowel habit, nausea and vomiting.  Neurological: Negative for difficulty with concentration, dizziness, focal weakness and headaches.  Psychiatric/Behavioral: Negative for altered mental status and suicidal ideas.  All other systems reviewed and are negative.     Objective:  Blood pressure (!) 128/54, pulse 81, temperature 97.9 F (36.6 C), temperature source Temporal, resp. rate 16, height '5\' 8"'$  (1.727 m), weight 189 lb 14.4 oz (86.1 kg), SpO2 98 %. Body mass index is 28.87 kg/m.    Physical Exam  Constitutional: He is oriented to person, place, and time. Vital signs are normal. He appears well-developed and well-nourished.  HENT:  Head: Normocephalic and atraumatic.  Cardiovascular: Normal rate, regular rhythm, normal heart sounds and intact distal pulses.  Pulses:      Femoral pulses are 2+ on the right side and 2+ on the left side.      Popliteal pulses are 2+ on the right side and 2+ on the left side.       Dorsalis pedis pulses are 2+ on the right side and 2+ on the left side.       Posterior tibial pulses are 0 on the right side and 0 on the left side.  Pulmonary/Chest: Effort normal and breath sounds normal. No accessory muscle usage.  No respiratory distress.  Abdominal: Soft. Bowel sounds are normal.  Musculoskeletal:        General: Normal range of motion.     Cervical back: Normal range of motion.  Neurological: He is alert and oriented to person, place, and time.  Skin: Skin is warm and dry.  Vitals reviewed.  Radiology:  Echocardiogram 05/05/2019: Left ventricle cavity is normal in size. Mild concentric hypertrophy of the left ventricle. Normal LV systolic function with EF 55%. Normal global wall motion. Doppler evidence of grade I (impaired) diastolic dysfunction, normal LAP.  Aneurysmal interatrial septum without 2D or color Doppler evidence of interatrial  shunt. No significant valvular abnormalities. Normal right atrial pressure.   CTA of chest 02/25/2019: Small pulmonary embolus in right lower lobe pulmonary arterial branches.   CT of abdomen 01/16/2018:  1. No acute findings are noted in the abdomen to account for the patient's symptoms. Specifically, no cholelithiasis and no evidence of acute cholecystitis at this time. 2. Aortic atherosclerosis, in addition to least 2 vessel coronary artery disease. Assessment for potential risk factor modification, dietary therapy or pharmacologic therapy may be warranted, if clinically indicated. 3. There are calcifications of the aortic valve. Echocardiographiccorrelation for evaluation of potential valvular dysfunction may be warranted if clinically indicated. 4. Cardiomegaly.  Laboratory examination:   08/08/2019: Cholesterol 126, triglycerides 55, HDL 45, LDL 69.  Creatinine 1.35, EGFR 51/59, potassium 5.2, glucose 131, CMP otherwise normal.  Hemoglobin A1c 7%.  CMP Latest Ref Rng & Units 09/18/2014 05/20/2014 05/19/2014  Glucose 70 - 99 mg/dL 82 143(H) 48(L)  BUN 6 - 23 mg/dL _0 Creatinine 0.50 - 1.35 mg/dL 1.11 1.12 1.30  Sodium 135 - 145 mmol/L 141 135(L) 140  Potassium 3.5 - 5.1 mmol/L 4.1 4.0 4.2  Chloride 96 - 112 mEq/L 106 97 102  CO2 19 - 32  mmol/L 29 27 -  Calcium 8.4 - 10.5 mg/dL 9.4 8.9 -  Total Protein 6.0 - 8.3 g/dL - - -  Total Bilirubin 0.3 - 1.2 mg/dL - - -  Alkaline Phos 39 - 117 units/L - - -  AST 0 - 37 units/L - - -  ALT 0 - 53 units/L - - -   CBC Latest Ref Rng & Units 09/18/2014 05/20/2014 05/19/2014  WBC 4.0 - 10.5 K/uL 5.8 8.3 -  Hemoglobin 13.0 - 17.0 g/dL 10.6(L) 11.8(L) 14.6  Hematocrit 39.0 - 52.0 % 34.2(L) 36.2(L) 43.0  Platelets 150 - 400 K/uL 385 279 -   Lipid Panel  No results found for: CHOL, TRIG, HDL, CHOLHDL, VLDL, LDLCALC, LDLDIRECT HEMOGLOBIN A1C Lab Results  Component Value Date   HGBA1C 7.2 (H) 05/20/2014   MPG 160 (H) 05/20/2014   TSH No results for input(s): TSH in the last 8760 hours.  PRN Meds:. Medications Discontinued During This Encounter  Medication Reason   ibuprofen (IBU) 800 MG tablet Error   Vitamin D, Ergocalciferol, (DRISDOL) 1.25 MG (50000 UT) CAPS capsule Error   Current Meds  Medication Sig   aspirin EC 81 MG tablet Take 81 mg by mouth daily.   atorvastatin (LIPITOR) 20 MG tablet Take 20 mg by mouth daily.   augmented betamethasone dipropionate (DIPROLENE-AF) 0.05 % cream Apply to affected area daily as needed   dapagliflozin propanediol (FARXIGA) 5 MG TABS tablet Take 5 mg by mouth daily.   diclofenac sodium (VOLTAREN) 1 % GEL Apply 1 application topically 4 (four) times daily as needed (pain).   glucose blood (ONE TOUCH ULTRA TEST) test strip Use to test blood sugar twice a day   insulin NPH-regular Human (NOVOLIN 70/30) (70-30) 100 UNIT/ML injection Inject 25-35 Units into the skin 2 (two) times daily with a meal. Humulin   Lancets (ONETOUCH DELICA PLUS [id number]) MISC Use to test blood sugar twice daily   metFORMIN (GLUCOPHAGE) 500 MG tablet Take 2 tablets by mouth twice daily   metoprolol succinate (TOPROL-XL) 25 MG 24 hr tablet Take 1 tablet (25 mg total) by mouth daily. Take with or immediately following a meal. Take at night.   rivaroxaban  (XARELTO) 20 MG TABS tablet Take 20 mg by mouth daily with  supper.   vitamin B-12 (CYANOCOBALAMIN) 100 MCG tablet Take 100 mcg by mouth daily.    Cardiac Studies:   Nocturnal oximetry 05/30/2018: O2 sat < 87% 2.7 minutes, < 89% 6.7 minutes, highest SpO2 99, lowest 77%.  Oxygen desaturation events 3%, Oxygen desaturation index is 16.  Qualifies for oxygen supplementation by Medicare guidelines group II.  Echocardiogram 05/05/2019: Left ventricle cavity is normal in size. Mild concentric hypertrophy of the left ventricle. Normal LV systolic function with EF 55%. Normal global wall motion. Doppler evidence of grade I (impaired) diastolic dysfunction, normal LAP.  Aneurysmal interatrial septum without 2D or color Doppler evidence of interatrial shunt. No significant valvular abnormalities. Normal right atrial pressure.   Lexiscan myoview stress test 03/25/2018: 1. Lexiscan stress test was performed. Exercise capacity was not assessed. No stress symptoms reported. Peak effect blood pressure was 82/56 mmHg. The resting and stress electrocardiogram demonstrated sinus bradycardia, RBBB, no resting arrhythmias and normal rest repolarization. Stress EKG is non diagnostic for ischemia as it is a pharmacologic stress. 2. The overall quality of the study is good. Left ventricular cavity is noted to be normal on the rest and stress studies. Gated SPECT images reveal normal myocardial thickening and wall motion. The left ventricular ejection fraction was calculated to be 42%, although visually appears normal. REST and STRESS images demonstrate decreased tracer uptake in the basal inferior, basal inferolateral and mid inferolateral segments of the left ventricle. While defect could be related to tissue attenuation, ischemia cannot be excluded. 3. Intermediate risk study.  Lower extremity arterial duplex 08/12/2018: No hemodynamically significant stenoses are identified in the lower extremity arterial  system. Non-compressible ABI on both legs, suggestive of medial calcinosis. Moderately abnormal waveforms of the ankle. Diffuse plaque noted especially below knee vessels.  Sleep study Dr. Brett Fairy 04/03/2016: Sleep study very mild sleep apnea, resolved with nonsupine sleeping position.  Assessment:     ICD-10-CM   1. Essential hypertension  I10   2. Coronary artery calcification seen on CT scan  I25.10   3. Claudication in peripheral vascular disease (HCC)  I73.9   4. Pulmonary embolism on right [facility])  I26.99     EKG 04/09/2019: Sinus bradycardia at 56 bpm, normal axis, RBBB. 1 PVC. Abnormal EKG  Recommendations:   Patient doing well without complaints today.  He denies any chest pain or shortness of breath.  He continues to be on anticoagulation for PE as per his PCP.  No RV strain was noted on echocardiogram performed in August. He is tolerating metoprolol well and blood pressure is well controlled.  We will continue with half a tablet of metoprolol succinate.  I reviewed recent labs from PCP office, his lipids are well controlled.  Continue with Lipitor 20 mg daily as he will need continued aggressive lipid control in view of coronary calcification on CT scan.  His symptoms of claudication have improved since being started on gabapentin. Will continue with this per his PCP. I will plan to see him back in 6 months, but encouraged sooner follow up if needed.   Miquel Dunn, MSN, APRN, FNP-C [facility]. [place] Office: [phone] Fax: [phone]

## 2019-09-09 ENCOUNTER — Encounter: Payer: Self-pay | Admitting: Cardiology

## 2019-09-18 ENCOUNTER — Inpatient Hospital Stay (HOSPITAL_COMMUNITY)
Admission: EM | Admit: 2019-09-18 | Discharge: 2019-09-21 | DRG: 377 | Disposition: A | Discharge status: Home / Self Care | Payer: HMO | Attending: Internal Medicine | Admitting: Internal Medicine

## 2019-09-18 ENCOUNTER — Other Ambulatory Visit: Payer: Self-pay

## 2019-09-18 ENCOUNTER — Encounter (HOSPITAL_COMMUNITY): Payer: Self-pay | Admitting: Emergency Medicine

## 2019-09-18 DIAGNOSIS — E11649 Type 2 diabetes mellitus with hypoglycemia without coma: Secondary | ICD-10-CM | POA: Diagnosis not present

## 2019-09-18 DIAGNOSIS — I10 Essential (primary) hypertension: Secondary | ICD-10-CM | POA: Diagnosis present

## 2019-09-18 DIAGNOSIS — E785 Hyperlipidemia, unspecified: Secondary | ICD-10-CM | POA: Diagnosis not present

## 2019-09-18 DIAGNOSIS — I2699 Other pulmonary embolism without acute cor pulmonale: Secondary | ICD-10-CM | POA: Diagnosis not present

## 2019-09-18 DIAGNOSIS — Z833 Family history of diabetes mellitus: Secondary | ICD-10-CM | POA: Diagnosis not present

## 2019-09-18 DIAGNOSIS — Z87891 Personal history of nicotine dependence: Secondary | ICD-10-CM

## 2019-09-18 DIAGNOSIS — Q2733 Arteriovenous malformation of digestive system vessel: Secondary | ICD-10-CM | POA: Diagnosis not present

## 2019-09-18 DIAGNOSIS — Z03818 Encounter for observation for suspected exposure to other biological agents ruled out: Secondary | ICD-10-CM | POA: Diagnosis not present

## 2019-09-18 DIAGNOSIS — Z7982 Long term (current) use of aspirin: Secondary | ICD-10-CM | POA: Diagnosis not present

## 2019-09-18 DIAGNOSIS — E119 Type 2 diabetes mellitus without complications: Secondary | ICD-10-CM

## 2019-09-18 DIAGNOSIS — K219 Gastro-esophageal reflux disease without esophagitis: Secondary | ICD-10-CM | POA: Diagnosis not present

## 2019-09-18 DIAGNOSIS — Z20822 Contact with and (suspected) exposure to covid-19: Secondary | ICD-10-CM | POA: Diagnosis present

## 2019-09-18 DIAGNOSIS — Z86711 Personal history of pulmonary embolism: Secondary | ICD-10-CM | POA: Diagnosis not present

## 2019-09-18 DIAGNOSIS — D62 Acute posthemorrhagic anemia: Secondary | ICD-10-CM | POA: Diagnosis present

## 2019-09-18 DIAGNOSIS — Z794 Long term (current) use of insulin: Secondary | ICD-10-CM | POA: Diagnosis not present

## 2019-09-18 DIAGNOSIS — Z79899 Other long term (current) drug therapy: Secondary | ICD-10-CM

## 2019-09-18 DIAGNOSIS — K922 Gastrointestinal hemorrhage, unspecified: Secondary | ICD-10-CM | POA: Diagnosis not present

## 2019-09-18 DIAGNOSIS — D649 Anemia, unspecified: Secondary | ICD-10-CM | POA: Diagnosis not present

## 2019-09-18 DIAGNOSIS — Z888 Allergy status to other drugs, medicaments and biological substances status: Secondary | ICD-10-CM

## 2019-09-18 DIAGNOSIS — K31819 Angiodysplasia of stomach and duodenum without bleeding: Secondary | ICD-10-CM | POA: Diagnosis not present

## 2019-09-18 DIAGNOSIS — E7800 Pure hypercholesterolemia, unspecified: Secondary | ICD-10-CM | POA: Diagnosis not present

## 2019-09-18 DIAGNOSIS — K5520 Angiodysplasia of colon without hemorrhage: Secondary | ICD-10-CM | POA: Diagnosis not present

## 2019-09-18 DIAGNOSIS — K625 Hemorrhage of anus and rectum: Secondary | ICD-10-CM | POA: Diagnosis present

## 2019-09-18 DIAGNOSIS — Z7901 Long term (current) use of anticoagulants: Secondary | ICD-10-CM | POA: Diagnosis not present

## 2019-09-18 DIAGNOSIS — K921 Melena: Principal | ICD-10-CM | POA: Diagnosis present

## 2019-09-18 LAB — CBC WITH DIFFERENTIAL/PLATELET
Abs Immature Granulocytes: 0.02 10*3/uL (ref 0.00–0.07)
Basophils Absolute: 0.1 10*3/uL (ref 0.0–0.1)
Basophils Relative: 1 %
Eosinophils Absolute: 0.3 10*3/uL (ref 0.0–0.5)
Eosinophils Relative: 3 %
HCT: 28.1 % — ABNORMAL LOW (ref 39.0–52.0)
Hemoglobin: 8 g/dL — ABNORMAL LOW (ref 13.0–17.0)
Immature Granulocytes: 0 %
Lymphocytes Relative: 27 %
Lymphs Abs: 2.4 10*3/uL (ref 0.7–4.0)
MCH: 22.7 pg — ABNORMAL LOW (ref 26.0–34.0)
MCHC: 28.5 g/dL — ABNORMAL LOW (ref 30.0–36.0)
MCV: 79.8 fL — ABNORMAL LOW (ref 80.0–100.0)
Monocytes Absolute: 0.9 10*3/uL (ref 0.1–1.0)
Monocytes Relative: 10 %
Neutro Abs: 5.3 10*3/uL (ref 1.7–7.7)
Neutrophils Relative %: 59 %
Platelets: 355 10*3/uL (ref 150–400)
RBC: 3.52 MIL/uL — ABNORMAL LOW (ref 4.22–5.81)
RDW: 15.8 % — ABNORMAL HIGH (ref 11.5–15.5)
WBC: 9 10*3/uL (ref 4.0–10.5)
nRBC: 0 % (ref 0.0–0.2)

## 2019-09-18 LAB — RESPIRATORY PANEL BY RT PCR (FLU A&B, COVID)
Influenza A by PCR: NEGATIVE
Influenza B by PCR: NEGATIVE
SARS Coronavirus 2 by RT PCR: NEGATIVE

## 2019-09-18 MED ORDER — SODIUM CHLORIDE 0.9 % IV BOLUS
500.0000 mL | Freq: Once | INTRAVENOUS | Status: AC
  Administered 2019-09-18: 22:00:00 500 mL via INTRAVENOUS

## 2019-09-18 NOTE — H&P (Signed)
History and Physical   [name] [id number] DOB: [birth year] DOA: 09/18/2019  Referring MD/NP/PA: Dr. Vanita Panda  PCP: Jani Gravel, MD   Outpatient Specialists: None  Patient coming from: Home  Chief Complaint: Rectal bleed  HPI: Matthew Zimmerman is a 74 y.o. male with medical history significant of diabetes, hypertension, GERD, pulmonary embolism currently on Xarelto and hyperlipidemia who came to the ER complaining of rectal bleed that occurred today.  He reported large amount of blood and clots.  Patient's baseline hemoglobin is around 12 g and hemoglobin here is 8 g.  He feels slightly lightheaded.  Denied any passing out.  Denied any NSAIDs use.  Patient continues to take Xarelto for pulmonary embolism.  No fever or chills.  No hematemesis.  No abdominal pain at this point.  Just some cramping.  Patient is being admitted with GI bleed most likely worsened by his anticoagulation.  GI has been consulted..  ED Course: Temperature 98.8 blood pressure 155/68 pulse 143 respirate of 18 oxygen sat 92% room air.  White count 9.0 hemoglobin 8.0 and platelets of 355.  Chemistry is stable.  COVID-19 screening is pending.  Patient being admitted with GI bleed for work-up.  Dr. Michail Sermon has been consulted  Review of Systems: As per HPI otherwise 10 point review of systems negative.    Past Medical History:  Diagnosis Date  . Carpal tunnel syndrome on both sides   . Chest pain   . Diabetes mellitus without complication ([place])   . GERD (gastroesophageal reflux disease)   . Headache   . Hypercholesteremia   . Hyperlipidemia   . PE (pulmonary embolism)   . Shortness of breath dyspnea    PE     Past Surgical History:  Procedure Laterality Date  . BILATERAL CARPAL TUNNEL RELEASE Bilateral   . COLONOSCOPY    . ELBOW SURGERY     BONE SPURS REMOVED B ELBOWS  . EYE SURGERY Bilateral    cataracts  . FOOT SURGERY     HEEL SPURS REMOVED B HEELS  . I & D EXTREMITY Right 09/18/2014   Procedure: IRRIGATION AND DEBRIDEMENT RIGHT THUMB, open fracture repair;  Surgeon: Linna Hoff, MD;  Location: [place];  Service: Orthopedics;  Laterality: Right;  . LEG SURGERY Left    left thigh table saw accident, "cut to the bone", I&D/suturing  . SHOULDER SURGERY Left    Bone spur  . SPINE SURGERY  09/04/1985   lumbar spine  . TOE SURGERY       reports that he quit smoking about 57 years ago. His smoking use included cigarettes. He smoked 0.50 packs per day. He has quit using smokeless tobacco. He reports that he does not drink alcohol or use drugs.  Allergies  Allergen Reactions  . Cortisone Other (See Comments)    CBG thrown out of control    Family History  Problem Relation Age of Onset  . Cancer Mother   . Diabetes Father      Prior to Admission medications   Medication Sig Start Date End Date Taking? Authorizing Provider  aspirin EC 81 MG tablet Take 81 mg by mouth daily.   Yes [provider]  atorvastatin (LIPITOR) 20 MG tablet Take 10 mg by mouth daily.    Yes [provider]  augmented betamethasone dipropionate (DIPROLENE-AF) 0.05 % cream Apply 1 application topically daily as needed (itchy skin near bottom).  10/28/18  Yes [provider]  dapagliflozin propanediol (FARXIGA) 5 MG TABS tablet Take  5 mg by mouth daily.   Yes Jani Gravel, MD  diclofenac sodium (VOLTAREN) 1 % GEL Apply 1 application topically 4 (four) times daily as needed (pain).   Yes [provider]  insulin NPH-regular Human (NOVOLIN 70/30) (70-30) 100 UNIT/ML injection Inject 25-35 Units into the skin 2 (two) times daily with a meal. Humulin   Yes [provider]  metFORMIN (GLUCOPHAGE) 500 MG tablet Take 2 tablets by mouth twice daily 01/30/19  Yes [provider]  metoprolol succinate (TOPROL-XL) 25 MG 24 hr tablet Take 1 tablet (25 mg total) by mouth daily. Take with or immediately following a meal. Take at night. Patient taking differently: Take  25 mg by mouth daily. Take with or immediately following a meal. 06/17/19 09/18/19 Yes Miquel Dunn, NP  rivaroxaban (XARELTO) 20 MG TABS tablet Take 20 mg by mouth daily.    Yes [provider]  vitamin B-12 (CYANOCOBALAMIN) 100 MCG tablet Take 100 mcg by mouth daily.   Yes [provider]  Vitamin D, Ergocalciferol, (DRISDOL) 1.25 MG (50000 UNIT) CAPS capsule Take 50,000 Units by mouth every 7 (seven) days.   Yes [provider]  glucose blood (ONE TOUCH ULTRA TEST) test strip Use to test blood sugar twice a day 12/12/18   [provider]  Lancets (ONETOUCH DELICA PLUS [id number]) [place] Use to test blood sugar twice daily 12/12/18   [provider]    Physical Exam: Vitals:   09/18/19 1925 09/18/19 2100 09/18/19 2133 09/18/19 2249  BP:  129/70 136/65 (!) 155/68  Pulse:  72 73 (!) 143  Resp:   18 16  Temp:      TempSrc:      SpO2: 98% 100% 100% 92%      Constitutional: NAD, calm, comfortable, obese Vitals:   09/18/19 1925 09/18/19 2100 09/18/19 2133 09/18/19 2249  BP:  129/70 136/65 (!) 155/68  Pulse:  72 73 (!) 143  Resp:   18 16  Temp:      TempSrc:      SpO2: 98% 100% 100% 92%   Eyes: PERRL, lids and conjunctivae pale ENMT: Mucous membranes are moist. Posterior pharynx clear of any exudate or lesions.Normal dentition.  Neck: normal, supple, no masses, no thyromegaly Respiratory: clear to auscultation bilaterally, no wheezing, no crackles. Normal respiratory effort. No accessory muscle use.  Cardiovascular: Sinus tachycardia, no murmurs / rubs / gallops. No extremity edema. 2+ pedal pulses. No carotid bruits.  Abdomen: no tenderness, no masses palpated. No hepatosplenomegaly. Bowel sounds positive.  Musculoskeletal: no clubbing / cyanosis. No joint deformity upper and lower extremities. Good ROM, no contractures. Normal muscle tone.  Skin: no rashes, lesions, ulcers. No induration Neurologic: CN 2-12 grossly intact. Sensation  intact, DTR normal. Strength 5/5 in all 4.  Psychiatric: Normal judgment and insight. Alert and oriented x 3. Normal mood.     Labs on Admission: I have personally reviewed following labs and imaging studies  CBC: Recent Labs  Lab 09/18/19 2220  WBC 9.0  NEUTROABS 5.3  HGB 8.0*  HCT 28.1*  MCV 79.8*  PLT [id number]   Basic Metabolic Panel: No results for input(s): NA, K, CL, CO2, GLUCOSE, BUN, CREATININE, CALCIUM, MG, PHOS in the last 168 hours. GFR: CrCl cannot be calculated (Patient's most recent lab result is older than the maximum 21 days allowed.). Liver Function Tests: No results for input(s): AST, ALT, ALKPHOS, BILITOT, PROT, ALBUMIN in the last 168 hours. No results for input(s): LIPASE, AMYLASE in the  last 168 hours. No results for input(s): AMMONIA in the last 168 hours. Coagulation Profile: No results for input(s): INR, PROTIME in the last 168 hours. Cardiac Enzymes: No results for input(s): CKTOTAL, CKMB, CKMBINDEX, TROPONINI in the last 168 hours. BNP (last 3 results) No results for input(s): PROBNP in the last 8760 hours. HbA1C: No results for input(s): HGBA1C in the last 72 hours. CBG: No results for input(s): GLUCAP in the last 168 hours. Lipid Profile: No results for input(s): CHOL, HDL, LDLCALC, TRIG, CHOLHDL, LDLDIRECT in the last 72 hours. Thyroid Function Tests: No results for input(s): TSH, T4TOTAL, FREET4, T3FREE, THYROIDAB in the last 72 hours. Anemia Panel: No results for input(s): VITAMINB12, FOLATE, FERRITIN, TIBC, IRON, RETICCTPCT in the last 72 hours. Urine analysis:    Component Value Date/Time   COLORURINE AMBER (A) 05/19/2014 1814   APPEARANCEUR CLEAR 05/19/2014 1814   LABSPEC 1.029 05/19/2014 1814   PHURINE 5.0 05/19/2014 1814   GLUCOSEU NEGATIVE 05/19/2014 1814   HGBUR NEGATIVE 05/19/2014 1814   BILIRUBINUR SMALL (A) 05/19/2014 1814   BILIRUBINUR neg 10/15/2012 1358   KETONESUR 15 (A) 05/19/2014 1814   PROTEINUR 100 (A) 05/19/2014 1814     UROBILINOGEN 1.0 05/19/2014 1814   NITRITE NEGATIVE 05/19/2014 1814   LEUKOCYTESUR NEGATIVE 05/19/2014 1814   Sepsis Labs: @LABRCNTIP (procalcitonin:4,lacticidven:4) )No results found for this or any previous visit (from the past 240 hour(s)).   Radiological Exams on Admission: No results found.  EKG: Independently reviewed.  It shows sinus tachycardia no significant changes  Assessment/Plan Principal Problem:   Rectal bleed Active Problems:   Diabetes ([place])   Essential hypertension   GERD   Pulmonary embolism (HCC)   Anemia due to acute blood loss     #1 rectal bleed: Cause not clear.  May be diverticular bleed worsened by use of Xarelto.  It could also be other causes.  We will admit the patient.  Discontinue Xarelto and aspirin for now.  GI consulted.  Serial CBCs, IV Protonix, IV fluids, he is symptomatic so we will transfuse 2 units of packed red blood cells and monitor.  #2 diabetes: Hold Metformin and initiate sliding scale insulin.  #3 hypertension: Monitor closely.  Continue beta-blockers as tolerated.  #4 GERD: We will continue with PPIs  #5 history of pulmonary embolism: On chronic anticoagulation.  We will hold anticoagulation temporarily.  Had pulmonary embolism years ago.  Will need a reassessment of ongoing anticoagulation.    DVT prophylaxis: SCD Code Status: Full code Family Communication: No family at bedside Disposition Plan: Home Consults called: Dr. Michail Sermon of [facility] Admission status: Inpatient  Severity of Illness: The appropriate patient status for this patient is INPATIENT. Inpatient status is judged to be reasonable and necessary in order to provide the required intensity of service to ensure the patient's safety. The patient's presenting symptoms, physical exam findings, and initial radiographic and laboratory data in the context of their chronic comorbidities is felt to place them at high risk for further clinical deterioration.  Furthermore, it is not anticipated that the patient will be medically stable for discharge from the hospital within 2 midnights of admission. The following factors support the patient status of inpatient.   " The patient's presenting symptoms include rectal bleed. " The worrisome physical exam findings include mild pallor and tachycardia. " The initial radiographic and laboratory data are worrisome because of hemoglobin 8 g. " The chronic co-morbidities include GERD and diabetes.   * I certify that at the point of admission  it is my clinical judgment that the patient will require inpatient hospital care spanning beyond 2 midnights from the point of admission due to high intensity of service, high risk for further deterioration and high frequency of surveillance required.Barbette Merino MD [facility] Pager [phone]  If 7PM-7AM, please contact night-coverage [email or web address] Password [facility]  09/18/2019, 11:07 PM

## 2019-09-18 NOTE — ED Triage Notes (Signed)
Pt reports several BM that had large amounts of blood and clots pt admits to take Xarelto 20 mg daily

## 2019-09-18 NOTE — ED Provider Notes (Addendum)
[facility] DEPT Provider Note   CSN: [id number] Arrival date & time: 09/18/19  1902     History Chief Complaint  Patient presents with  . Rectal Bleeding    Matthew Zimmerman is a 74 y.o. male.  HPI    Patient presents after 2 episodes of bright red blood. Currently has no complaints, including abdominal pain, lightheadedness, chest pain, dyspnea. He was in his usual state of health until about 2 hours ago. After drinking something he felt sudden urge to defecate.  Subsequently he has had after mentioned bloody stool.  No concurrent other changes including lightheadedness, syncope, falling, chest pain, dyspnea. Patient is on Xarelto due to history of pulmonary embolism.  He has no prior similar history of rectal bleeding.   Past Medical History:  Diagnosis Date  . Carpal tunnel syndrome on both sides   . Chest pain   . Diabetes mellitus without complication ([place])   . GERD (gastroesophageal reflux disease)   . Headache   . Hypercholesteremia   . Hyperlipidemia   . PE (pulmonary embolism)   . Shortness of breath dyspnea    PE     Patient Active Problem List   Diagnosis Date Noted  . PE (pulmonary embolism)   . Plantar fasciitis of right foot 05/21/2018  . Neck pain 05/20/2014  . Intractable back pain 05/20/2014  . Warfarin-induced coagulopathy ([place]) 05/20/2014  . DM 05/12/2010  . HYPERTENSION 05/12/2010  . GERD 05/12/2010  . COLONIC POLYPS, HX OF 05/12/2010    Past Surgical History:  Procedure Laterality Date  . BILATERAL CARPAL TUNNEL RELEASE Bilateral   . COLONOSCOPY    . ELBOW SURGERY     BONE SPURS REMOVED B ELBOWS  . EYE SURGERY Bilateral    cataracts  . FOOT SURGERY     HEEL SPURS REMOVED B HEELS  . I & D EXTREMITY Right 09/18/2014   Procedure: IRRIGATION AND DEBRIDEMENT RIGHT THUMB, open fracture repair;  Surgeon: Linna Hoff, MD;  Location: [facility];  Service: Orthopedics;  Laterality: Right;  . LEG SURGERY Left    left thigh table saw accident, "cut to the bone", I&D/suturing  . SHOULDER SURGERY Left    Bone spur  . SPINE SURGERY  09/04/1985   lumbar spine  . TOE SURGERY         Family History  Problem Relation Age of Onset  . Cancer Mother   . Diabetes Father     Social History   Tobacco Use  . Smoking status: Former Smoker    Packs/day: 0.50    Types: Cigarettes    Quit date: 1964    Years since quitting: 57.0  . Smokeless tobacco: Former Systems developer  . Tobacco comment: smoking since 74 years old  Substance Use Topics  . Alcohol use: No  . Drug use: No    Home Medications Prior to Admission medications   Medication Sig Start Date End Date Taking? Authorizing Provider  aspirin EC 81 MG tablet Take 81 mg by mouth daily.   Yes [provider]  atorvastatin (LIPITOR) 20 MG tablet Take 10 mg by mouth daily.    Yes [provider]  augmented betamethasone dipropionate (DIPROLENE-AF) 0.05 % cream Apply 1 application topically daily as needed (itchy skin near bottom).  10/28/18  Yes [provider]  dapagliflozin propanediol (FARXIGA) 5 MG TABS tablet Take 5 mg by mouth daily.   Yes Jani Gravel, MD  diclofenac sodium (VOLTAREN) 1 % GEL Apply 1 application  topically 4 (four) times daily as needed (pain).   Yes [provider]  insulin NPH-regular Human (NOVOLIN 70/30) (70-30) 100 UNIT/ML injection Inject 25-35 Units into the skin 2 (two) times daily with a meal. Humulin   Yes [provider]  metFORMIN (GLUCOPHAGE) 500 MG tablet Take 2 tablets by mouth twice daily 01/30/19  Yes [provider]  metoprolol succinate (TOPROL-XL) 25 MG 24 hr tablet Take 1 tablet (25 mg total) by mouth daily. Take with or immediately following a meal. Take at night. Patient taking differently: Take 25 mg by mouth daily. Take with or immediately following a meal. 06/17/19 09/18/19 Yes Miquel Dunn, NP  rivaroxaban (XARELTO) 20 MG TABS tablet Take 20 mg by mouth  daily.    Yes [provider]  vitamin B-12 (CYANOCOBALAMIN) 100 MCG tablet Take 100 mcg by mouth daily.   Yes [provider]  Vitamin D, Ergocalciferol, (DRISDOL) 1.25 MG (50000 UNIT) CAPS capsule Take 50,000 Units by mouth every 7 (seven) days.   Yes [provider]  glucose blood (ONE TOUCH ULTRA TEST) test strip Use to test blood sugar twice a day 12/12/18   [provider]  Lancets [facility] DELICA PLUS [id number]) [place] Use to test blood sugar twice daily 12/12/18   [provider]    Allergies    Cortisone  Review of Systems   Review of Systems  Constitutional:       Per HPI, otherwise negative  HENT:       Per HPI, otherwise negative  Respiratory:       Per HPI, otherwise negative  Cardiovascular:       Per HPI, otherwise negative  Gastrointestinal: Positive for blood in stool. Negative for abdominal pain and vomiting.  Endocrine:       Negative aside from HPI  Genitourinary:       Neg aside from HPI   Musculoskeletal:       Per HPI, otherwise negative  Skin: Negative.   Neurological: Negative for syncope.    Physical Exam Updated Vital Signs BP (!) 155/68 (BP Location: Right Arm)   Pulse (!) 143   Temp 98.8 F (37.1 C) (Oral)   Resp 16   SpO2 92%   Physical Exam Vitals and nursing note reviewed.  Constitutional:      General: He is not in acute distress.    Appearance: He is well-developed.  HENT:     Head: Normocephalic and atraumatic.  Eyes:     Conjunctiva/sclera: Conjunctivae normal.  Cardiovascular:     Rate and Rhythm: Normal rate and regular rhythm.  Pulmonary:     Effort: Pulmonary effort is normal. No respiratory distress.     Breath sounds: No stridor.  Abdominal:     General: There is no distension.     Tenderness: There is no abdominal tenderness. There is no guarding.  Skin:    General: Skin is warm and dry.  Neurological:     Mental Status: He is alert and oriented to person, place, and time.      ED Results / Procedures / Treatments   Labs (all labs ordered are listed, but only abnormal results are displayed) Labs Reviewed  CBC WITH DIFFERENTIAL/PLATELET - Abnormal; Notable for the following components:      Result Value   RBC 3.52 (*)    Hemoglobin 8.0 (*)    HCT 28.1 (*)    MCV 79.8 (*)    MCH 22.7 (*)    MCHC  28.5 (*)    RDW 15.8 (*)    All other components within normal limits  RESPIRATORY PANEL BY RT PCR (FLU A&B, COVID)  COMPREHENSIVE METABOLIC PANEL  TYPE AND SCREEN    Procedures Procedures (including critical care time)  Medications Ordered in ED Medications  sodium chloride 0.9 % bolus 500 mL (500 mLs Intravenous New Bag/Given 09/18/19 2147)    ED Course  I have reviewed the triage vital signs and the nursing notes.  Pertinent labs & imaging results that were available during my care of the patient were reviewed by me and considered in my medical decision making (see chart for details).     Update: Patient has had 1 bowel movement, with frankly red blood.  Update: I discussed patient's case with our gastroenterology colleagues. Dr. Michail Sermon is covering for the [facility] GI team. They will follow as a consulting team and see the patient in the morning.  Update: I d/w our IM colleague the admission.  Patient will start IV protonix  Update: Patient in similar condition.  MDM Rules/Calculators/A&P                      10:51 PM This adult male on Xarelto for history of PE presents with bright red blood per rectum. Patient has no abdominal pain, no vomiting, no fever.  No negation for CT scan, but given concern for ongoing bleeding, hemoglobin dropped to 8 from baseline ~11, the patient will require admission for monitoring, management. Patient is otherwise asymptomatic, no indication for emergent transfusion. GI aware, hospitalist called for admission.  Covid pending on admission Final Clinical Impression(s) / ED Diagnoses Final diagnoses:  Acute  GI bleeding     Carmin Muskrat, MD 09/18/19 [id number]    Carmin Muskrat, MD 09/18/19 [phone]

## 2019-09-19 ENCOUNTER — Other Ambulatory Visit: Payer: Self-pay | Admitting: *Deleted

## 2019-09-19 ENCOUNTER — Encounter (HOSPITAL_COMMUNITY): Payer: Self-pay | Admitting: Internal Medicine

## 2019-09-19 DIAGNOSIS — I10 Essential (primary) hypertension: Secondary | ICD-10-CM

## 2019-09-19 DIAGNOSIS — D62 Acute posthemorrhagic anemia: Secondary | ICD-10-CM

## 2019-09-19 LAB — CBC
HCT: 26.1 % — ABNORMAL LOW (ref 39.0–52.0)
HCT: 29.9 % — ABNORMAL LOW (ref 39.0–52.0)
HCT: 29.7 % — ABNORMAL LOW (ref 39.0–52.0)
HCT: 29.5 % — ABNORMAL LOW (ref 39.0–52.0)
Hemoglobin: 7.6 g/dL — ABNORMAL LOW (ref 13.0–17.0)
Hemoglobin: 9.1 g/dL — ABNORMAL LOW (ref 13.0–17.0)
Hemoglobin: 9.1 g/dL — ABNORMAL LOW (ref 13.0–17.0)
Hemoglobin: 8.9 g/dL — ABNORMAL LOW (ref 13.0–17.0)
MCH: 23.1 pg — ABNORMAL LOW (ref 26.0–34.0)
MCH: 24.3 pg — ABNORMAL LOW (ref 26.0–34.0)
MCH: 24.4 pg — ABNORMAL LOW (ref 26.0–34.0)
MCH: 24.2 pg — ABNORMAL LOW (ref 26.0–34.0)
MCHC: 29.1 g/dL — ABNORMAL LOW (ref 30.0–36.0)
MCHC: 30.4 g/dL (ref 30.0–36.0)
MCHC: 30.6 g/dL (ref 30.0–36.0)
MCHC: 30.2 g/dL (ref 30.0–36.0)
MCV: 79.3 fL — ABNORMAL LOW (ref 80.0–100.0)
MCV: 79.9 fL — ABNORMAL LOW (ref 80.0–100.0)
MCV: 79.6 fL — ABNORMAL LOW (ref 80.0–100.0)
MCV: 80.2 fL (ref 80.0–100.0)
Platelets: 312 10*3/uL (ref 150–400)
Platelets: 282 10*3/uL (ref 150–400)
Platelets: 281 10*3/uL (ref 150–400)
Platelets: 270 10*3/uL (ref 150–400)
RBC: 3.29 MIL/uL — ABNORMAL LOW (ref 4.22–5.81)
RBC: 3.74 MIL/uL — ABNORMAL LOW (ref 4.22–5.81)
RBC: 3.73 MIL/uL — ABNORMAL LOW (ref 4.22–5.81)
RBC: 3.68 MIL/uL — ABNORMAL LOW (ref 4.22–5.81)
RDW: 15.8 % — ABNORMAL HIGH (ref 11.5–15.5)
RDW: 16 % — ABNORMAL HIGH (ref 11.5–15.5)
RDW: 16 % — ABNORMAL HIGH (ref 11.5–15.5)
RDW: 16 % — ABNORMAL HIGH (ref 11.5–15.5)
WBC: 8.6 10*3/uL (ref 4.0–10.5)
WBC: 6.8 10*3/uL (ref 4.0–10.5)
WBC: 8.7 10*3/uL (ref 4.0–10.5)
WBC: 6.4 10*3/uL (ref 4.0–10.5)
nRBC: 0 % (ref 0.0–0.2)
nRBC: 0 % (ref 0.0–0.2)
nRBC: 0 % (ref 0.0–0.2)
nRBC: 0 % (ref 0.0–0.2)

## 2019-09-19 LAB — COMPREHENSIVE METABOLIC PANEL
ALT: 14 U/L (ref 0–44)
ALT: 16 U/L (ref 0–44)
AST: 15 U/L (ref 15–41)
AST: 17 U/L (ref 15–41)
Albumin: 3.5 g/dL (ref 3.5–5.0)
Albumin: 4 g/dL (ref 3.5–5.0)
Alkaline Phosphatase: 66 U/L (ref 38–126)
Alkaline Phosphatase: 74 U/L (ref 38–126)
Anion gap: 6 (ref 5–15)
Anion gap: 9 (ref 5–15)
BUN: 18 mg/dL (ref 8–23)
BUN: 20 mg/dL (ref 8–23)
CO2: 19 mmol/L — ABNORMAL LOW (ref 22–32)
CO2: 20 mmol/L — ABNORMAL LOW (ref 22–32)
Calcium: 8.4 mg/dL — ABNORMAL LOW (ref 8.9–10.3)
Calcium: 8.8 mg/dL — ABNORMAL LOW (ref 8.9–10.3)
Chloride: 112 mmol/L — ABNORMAL HIGH (ref 98–111)
Chloride: 111 mmol/L (ref 98–111)
Creatinine, Ser: 1.35 mg/dL — ABNORMAL HIGH (ref 0.61–1.24)
Creatinine, Ser: 1.55 mg/dL — ABNORMAL HIGH (ref 0.61–1.24)
GFR calc Af Amer: 60 mL/min — ABNORMAL LOW (ref >60)
GFR calc Af Amer: 50 mL/min — ABNORMAL LOW (ref >60)
GFR calc non Af Amer: 51 mL/min — ABNORMAL LOW (ref >60)
GFR calc non Af Amer: 43 mL/min — ABNORMAL LOW (ref >60)
Glucose, Bld: 140 mg/dL — ABNORMAL HIGH (ref 70–99)
Glucose, Bld: 170 mg/dL — ABNORMAL HIGH (ref 70–99)
Potassium: 4.4 mmol/L (ref 3.5–5.1)
Potassium: 4.3 mmol/L (ref 3.5–5.1)
Sodium: 137 mmol/L (ref 135–145)
Sodium: 140 mmol/L (ref 135–145)
Total Bilirubin: 0.5 mg/dL (ref 0.3–1.2)
Total Bilirubin: 0.2 mg/dL — ABNORMAL LOW (ref 0.3–1.2)
Total Protein: 6.6 g/dL (ref 6.5–8.1)
Total Protein: 7.4 g/dL (ref 6.5–8.1)

## 2019-09-19 LAB — HEMOGLOBIN A1C
Hgb A1c MFr Bld: 7.5 % — ABNORMAL HIGH (ref 4.8–5.6)
Mean Plasma Glucose: 168.55 mg/dL

## 2019-09-19 LAB — GLUCOSE, CAPILLARY
Glucose-Capillary: 109 mg/dL — ABNORMAL HIGH (ref 70–99)
Glucose-Capillary: 149 mg/dL — ABNORMAL HIGH (ref 70–99)
Glucose-Capillary: 147 mg/dL — ABNORMAL HIGH (ref 70–99)
Glucose-Capillary: 43 mg/dL — CL (ref 70–99)
Glucose-Capillary: 73 mg/dL (ref 70–99)
Glucose-Capillary: 105 mg/dL — ABNORMAL HIGH (ref 70–99)

## 2019-09-19 LAB — ABO/RH: ABO/RH(D): A POS

## 2019-09-19 LAB — PREPARE RBC (CROSSMATCH)

## 2019-09-19 MED ORDER — VITAMIN B-12 100 MCG PO TABS
100.0000 ug | ORAL_TABLET | Freq: Every day | ORAL | Status: DC
  Administered 2019-09-19 – 2019-09-21 (×3): 100 ug via ORAL
  Filled 2019-09-19 (×3): qty 1

## 2019-09-19 MED ORDER — PANTOPRAZOLE SODIUM 40 MG IV SOLR
40.0000 mg | Freq: Two times a day (BID) | INTRAVENOUS | Status: DC
  Administered 2019-09-19 – 2019-09-20 (×4): 40 mg via INTRAVENOUS
  Filled 2019-09-19 (×4): qty 40

## 2019-09-19 MED ORDER — METOPROLOL SUCCINATE ER 25 MG PO TB24
25.0000 mg | ORAL_TABLET | Freq: Every day | ORAL | Status: DC
  Administered 2019-09-19 – 2019-09-21 (×3): 25 mg via ORAL
  Filled 2019-09-19 (×3): qty 1

## 2019-09-19 MED ORDER — INSULIN ASPART PROT & ASPART (70-30 MIX) 100 UNIT/ML ~~LOC~~ SUSP: 5.0000 [IU] | Freq: Two times a day (BID) | SUBCUTANEOUS | Status: DC

## 2019-09-19 MED ORDER — BISACODYL 10 MG RE SUPP
10.0000 mg | Freq: Once | RECTAL | Status: AC
  Administered 2019-09-19: 10 mg via RECTAL
  Filled 2019-09-19: qty 1

## 2019-09-19 MED ORDER — ONDANSETRON HCL 4 MG/2ML IJ SOLN: 4.0000 mg | Freq: Four times a day (QID) | INTRAMUSCULAR | Status: DC | PRN

## 2019-09-19 MED ORDER — INSULIN ASPART PROT & ASPART (70-30 MIX) 100 UNIT/ML ~~LOC~~ SUSP
25.0000 [IU] | Freq: Two times a day (BID) | SUBCUTANEOUS | Status: AC
  Administered 2019-09-19: 25 [IU] via SUBCUTANEOUS
  Filled 2019-09-19: qty 10

## 2019-09-19 MED ORDER — SODIUM CHLORIDE 0.9 % IV SOLN: INTRAVENOUS | Status: DC

## 2019-09-19 MED ORDER — DEXTROSE-NACL 5-0.9 % IV SOLN: INTRAVENOUS | Status: DC

## 2019-09-19 MED ORDER — PEG 3350-KCL-NA BICARB-NACL 420 G PO SOLR
4000.0000 mL | Freq: Once | ORAL | Status: AC
  Administered 2019-09-19: 4000 mL via ORAL

## 2019-09-19 MED ORDER — CANAGLIFLOZIN 100 MG PO TABS
100.0000 mg | ORAL_TABLET | Freq: Every day | ORAL | Status: DC
  Filled 2019-09-19: qty 1

## 2019-09-19 MED ORDER — INSULIN ASPART 100 UNIT/ML ~~LOC~~ SOLN: 0.0000 [IU] | Freq: Every day | SUBCUTANEOUS | Status: DC

## 2019-09-19 MED ORDER — INSULIN ASPART 100 UNIT/ML ~~LOC~~ SOLN
0.0000 [IU] | Freq: Three times a day (TID) | SUBCUTANEOUS | Status: DC
  Administered 2019-09-20: 3 [IU] via SUBCUTANEOUS
  Administered 2019-09-20: 1 [IU] via SUBCUTANEOUS
  Administered 2019-09-21 (×2): 2 [IU] via SUBCUTANEOUS

## 2019-09-19 MED ORDER — SODIUM CHLORIDE 0.9% IV SOLUTION: Freq: Once | INTRAVENOUS | Status: AC

## 2019-09-19 MED ORDER — ONDANSETRON HCL 4 MG PO TABS: 4.0000 mg | ORAL_TABLET | Freq: Four times a day (QID) | ORAL | Status: DC | PRN

## 2019-09-19 MED ORDER — DEXTROSE 50 % IV SOLN
INTRAVENOUS | Status: AC
  Administered 2019-09-19: 25 mL
  Filled 2019-09-19: qty 50

## 2019-09-19 MED ORDER — ATORVASTATIN CALCIUM 10 MG PO TABS
10.0000 mg | ORAL_TABLET | Freq: Every day | ORAL | Status: DC
  Administered 2019-09-19 – 2019-09-21 (×3): 10 mg via ORAL
  Filled 2019-09-19 (×3): qty 1

## 2019-09-19 NOTE — H&P (View-Only) (Signed)
[facility] Consultation Note  Referring Provider: [facility] Primary Care Physician:  Jani Gravel, MD  Reason for Consultation:  hematochezia  HPI: Matthew Zimmerman is a 74 y.o. male whom we've been asked to see for hematochezia.  Patient admitted for a one-day history of abrupt onset hematochezia.  No abdominal pain, nausea, vomiting, unintentional weight loss, change in bowel habits.  Is on rivaroxaban for history of recurrent pulmonary emboli.  Had endoscopy and colonoscopy last in 2008 by Dr. Lajoyce Corners, colonoscopy showed small tubular adenoma at 20 cm.  Patient reports last episode of hematochezia was about 3 hours ago.   Past Medical History:  Diagnosis Date  . Carpal tunnel syndrome on both sides   . Chest pain   . Diabetes mellitus without complication ([place])   . GERD (gastroesophageal reflux disease)   . Headache   . Hypercholesteremia   . Hyperlipidemia   . PE (pulmonary embolism)   . Shortness of breath dyspnea    PE     Past Surgical History:  Procedure Laterality Date  . BILATERAL CARPAL TUNNEL RELEASE Bilateral   . COLONOSCOPY    . ELBOW SURGERY     BONE SPURS REMOVED B ELBOWS  . EYE SURGERY Bilateral    cataracts  . FOOT SURGERY     HEEL SPURS REMOVED B HEELS  . I & D EXTREMITY Right 09/18/2014   Procedure: IRRIGATION AND DEBRIDEMENT RIGHT THUMB, open fracture repair;  Surgeon: Linna Hoff, MD;  Location: [facility];  Service: Orthopedics;  Laterality: Right;  . LEG SURGERY Left    left thigh table saw accident, "cut to the bone", I&D/suturing  . SHOULDER SURGERY Left    Bone spur  . SPINE SURGERY  09/04/1985   lumbar spine  . TOE SURGERY      Prior to Admission medications   Medication Sig Start Date End Date Taking? Authorizing Provider  aspirin EC 81 MG tablet Take 81 mg by mouth daily.   Yes [provider]  atorvastatin (LIPITOR) 20 MG tablet Take 10 mg by mouth daily.    Yes [provider]  augmented betamethasone  dipropionate (DIPROLENE-AF) 0.05 % cream Apply 1 application topically daily as needed (itchy skin near bottom).  10/28/18  Yes [provider]  dapagliflozin propanediol (FARXIGA) 5 MG TABS tablet Take 5 mg by mouth daily.   Yes Jani Gravel, MD  diclofenac sodium (VOLTAREN) 1 % GEL Apply 1 application topically 4 (four) times daily as needed (pain).   Yes [provider]  insulin NPH-regular Human (NOVOLIN 70/30) (70-30) 100 UNIT/ML injection Inject 25-35 Units into the skin 2 (two) times daily with a meal. Humulin   Yes [provider]  metFORMIN (GLUCOPHAGE) 500 MG tablet Take 2 tablets by mouth twice daily 01/30/19  Yes [provider]  metoprolol succinate (TOPROL-XL) 25 MG 24 hr tablet Take 1 tablet (25 mg total) by mouth daily. Take with or immediately following a meal. Take at night. Patient taking differently: Take 25 mg by mouth daily. Take with or immediately following a meal. 06/17/19 09/18/19 Yes Miquel Dunn, NP  rivaroxaban (XARELTO) 20 MG TABS tablet Take 20 mg by mouth daily.    Yes [provider]  vitamin B-12 (CYANOCOBALAMIN) 100 MCG tablet Take 100 mcg by mouth daily.   Yes [provider]  Vitamin D, Ergocalciferol, (DRISDOL) 1.25 MG (50000 UNIT) CAPS capsule Take 50,000 Units by mouth every 7 (seven) days.   Yes [provider]  glucose  blood (ONE TOUCH ULTRA TEST) test strip Use to test blood sugar twice a day 12/12/18   [provider]  Lancets (ONETOUCH DELICA PLUS 123XX123) [place] Use to test blood sugar twice daily 12/12/18   [provider]    Current Facility-Administered Medications  Medication Dose Route Frequency Provider Last Rate Last Admin  . 0.9 %  sodium chloride infusion   Intravenous Continuous Elwyn Reach, MD 100 mL/hr at 09/19/19 0055 New Bag at 09/19/19 0055  . atorvastatin (LIPITOR) tablet 10 mg  10 mg Oral Daily Gala Romney L, MD   10 mg at 09/19/19 1009  . insulin  aspart (novoLOG) injection 0-5 Units  0-5 Units Subcutaneous QHS Garba, Mohammad L, MD      . insulin aspart (novoLOG) injection 0-9 Units  0-9 Units Subcutaneous TID WC Garba, Mohammad L, MD      . insulin aspart protamine- aspart (NOVOLOG MIX 70/30) injection 25 Units  25 Units Subcutaneous BID WC Elwyn Reach, MD   25 Units at 09/19/19 0829  . metoprolol succinate (TOPROL-XL) 24 hr tablet 25 mg  25 mg Oral Daily Gala Romney L, MD   25 mg at 09/19/19 1009  . ondansetron (ZOFRAN) tablet 4 mg  4 mg Oral Q6H PRN Elwyn Reach, MD       Or  . ondansetron (ZOFRAN) injection 4 mg  4 mg Intravenous Q6H PRN Gala Romney L, MD      . pantoprazole (PROTONIX) injection 40 mg  40 mg Intravenous Q12H Gala Romney L, MD   40 mg at 09/19/19 1009  . vitamin B-12 (CYANOCOBALAMIN) tablet 100 mcg  100 mcg Oral Daily Gala Romney L, MD   100 mcg at 09/19/19 1009    Allergies as of 09/18/2019 - Review Complete 09/18/2019  Allergen Reaction Noted  . Cortisone Other (See Comments)     Family History  Problem Relation Age of Onset  . Cancer Mother   . Diabetes Father     Social History   Socioeconomic History  . Marital status: Married    Spouse name: Not on file  . Number of children: 2  . Years of education: Not on file  . Highest education level: Not on file  Occupational History  . Not on file  Tobacco Use  . Smoking status: Former Smoker    Packs/day: 0.50    Types: Cigarettes    Quit date: 1964    Years since quitting: 57.0  . Smokeless tobacco: Former Systems developer  . Tobacco comment: smoking since 74 years old  Substance and Sexual Activity  . Alcohol use: No  . Drug use: No  . Sexual activity: Not on file  Other Topics Concern  . Not on file  Social History Narrative   Lives with wife.[name]   He is retired from [employer], also Waylan Boga son age 8 years old died suddenly at client's home on 11/03/18- results of autopsy still pending on 03/20/19   Social  Determinants of Health   Financial Resource Strain:   . Difficulty of Paying Living Expenses: Not on file  Food Insecurity:   . Worried About Charity fundraiser in the Last Year: Not on file  . Ran Out of Food in the Last Year: Not on file  Transportation Needs:   . Lack of Transportation (Medical): Not on file  . Lack of Transportation (Non-Medical): Not on file  Physical Activity: Inactive  . Days of Exercise per Week: 0  days  . Minutes of Exercise per Session: 0 min  Stress: No Stress Concern Present  . Feeling of Stress : Only a little  Social Connections: Unknown  . Frequency of Communication with Friends and Family: More than three times a week  . Frequency of Social Gatherings with Friends and Family: Not on file  . Attends Religious Services: Not on file  . Active Member of Clubs or Organizations: Not on file  . Attends Archivist Meetings: Not on file  . Marital Status: Married  Human resources officer Violence:   . Fear of Current or Ex-Partner: Not on file  . Emotionally Abused: Not on file  . Physically Abused: Not on file  . Sexually Abused: Not on file    Review of Systems: As per HPI, all others negative  Physical Exam: Vital signs in last 24 hours: Temp:  [97.6 F (36.4 C)-98.8 F (37.1 C)] 98.2 F (36.8 C) (01/15 1027) Pulse Rate:  [40-143] 57 (01/15 1027) Resp:  [14-24] 24 (01/15 1027) BP: (109-155)/(42-88) 137/70 (01/15 1027) SpO2:  [92 %-100 %] 100 % (01/15 1027) Weight:  [86 kg] 86 kg (01/15 0020) Last BM Date: 09/19/19 General:   Alert,  Well-developed, well-nourished, pleasant and cooperative in NAD Head:  Normocephalic and atraumatic. Eyes:  Sclera clear, no icterus.   Conjunctiva pink. Ears:  Normal auditory acuity. Nose:  No deformity, discharge,  or lesions. Mouth:  No deformity or lesions.  Oropharynx pink & moist. Neck:  Supple; no masses or thyromegaly. Lungs:  Clear throughout to auscultation.   No wheezes, crackles, or rhonchi. No  acute distress. Heart:  Regular rate and rhythm; no murmurs, clicks, rubs,  or gallops. Abdomen:  Soft, protuberant, nontender and nondistended. No masses, hepatosplenomegaly or hernias noted. Normal bowel sounds, without guarding, and without rebound.     Msk:  Symmetrical without gross deformities. Normal posture. Pulses:  Normal pulses noted. Extremities:  Without clubbing or edema. Neurologic:  Alert and  oriented x4;  grossly normal neurologically. Skin:  Intact without significant lesions or rashes. Psych:  Alert and cooperative. Normal mood and affect.   Lab Results: Recent Labs    09/18/19 2220 09/19/19 0056 09/19/19 0814  WBC 9.0 8.6 6.8  HGB 8.0* 7.6* 9.1*  HCT 28.1* 26.1* 29.9*  PLT 355 312 282   BMET Recent Labs    09/18/19 2220 09/19/19 0056  NA 140 137  K 4.3 4.4  CL 111 112*  CO2 20* 19*  GLUCOSE 170* 140*  BUN 20 18  CREATININE 1.55* 1.35*  CALCIUM 8.8* 8.4*   LFT Recent Labs    09/19/19 0056  PROT 6.6  ALBUMIN 3.5  AST 15  ALT 14  ALKPHOS 66  BILITOT 0.5   PT/INR No results for input(s): LABPROT, INR in the last 72 hours.  Studies/Results: No results found.  Impression:  1.  Painless hematochezia.  Suspect colonic diverticular bleeding, but other entities can't be excluded. 2.  Acute blood loss anemia. 3.  Recurrent pulmonary emboli, on chronic rivaroxaban (last dose yesterday).  Plan:  1.  Supportive care:  Volume repletion, serial CBCs, blood transfusion as needed. 2.  Rivaroxaban on hold. 3.  Clear liquid diet now, NPO after midnight. 4.  Plan for colonoscopy tomorrow with Dr. Therisa Doyne; if rampant rebleeding before then, would do expedited tagged RBC study as next step in management. 5.  Risks (bleeding, infection, bowel perforation that could require surgery, sedation-related changes in cardiopulmonary systems), benefits (identification and possible treatment of  source of symptoms, exclusion of certain causes of symptoms), and  alternatives (watchful waiting, radiographic imaging studies, empiric medical treatment) of colonoscopy were explained to patient/family in detail and patient wishes to proceed.   LOS: 1 day   Lariya Kinzie M  09/19/2019, 11:02 AM  Cell [phone] If no answer or after 5 PM call [phone]

## 2019-09-19 NOTE — Progress Notes (Signed)
Hypoglycemic Event  CBG:43  Treatment: 8 oz juice/soda and D50 25 mL (12.5 gm)  Symptoms: None  Follow-up CBG: Time:11:57 am CBG Result:147  Possible Reasons for Event: Inadequate meal intake  Comments/MD notified:Dr. Earlie Counts @ 11:45 am    Gomez Cleverly

## 2019-09-19 NOTE — Progress Notes (Signed)
PROGRESS NOTE    Matthew Zimmerman  [id number] DOB: 08-15-1946 DOA: 09/18/2019 PCP: Jani Gravel, MD    Brief Narrative:  74 y.o. male with medical history significant of diabetes, hypertension, GERD, pulmonary embolism currently on Xarelto and hyperlipidemia who came to the ER complaining of rectal bleed that occurred today.  He reported large amount of blood and clots.  Patient's baseline hemoglobin is around 12 g and hemoglobin here is 8 g.  He feels slightly lightheaded.  Denied any passing out.  Denied any NSAIDs use.  Patient continues to take Xarelto for pulmonary embolism.  No fever or chills.  No hematemesis.  No abdominal pain at this point.  Just some cramping.  Patient is being admitted with GI bleed most likely worsened by his anticoagulation.  GI has been consulted..  ED Course: Temperature 98.8 blood pressure 155/68 pulse 143 respirate of 18 oxygen sat 92% room air.  White count 9.0 hemoglobin 8.0 and platelets of 355.  Chemistry is stable.  COVID-19 screening is pending.  Patient being admitted with GI bleed for work-up.  Dr. Michail Sermon has been consulted  Assessment & Plan:   Principal Problem:   Rectal bleed Active Problems:   Diabetes ([place])   Essential hypertension   GERD   Pulmonary embolism ([place])   Anemia due to acute blood loss   #1 rectal bleed:  -Uncertain etiology at this time -GI consulted, appreciate input. Planning for colonoscopy tomorrow -NPO after midnight -Pt is s/p 2 units PRBC's -Repeat CBC in AM, continue to transfuse for hgb <7  #2 diabetes:  -Oral hyperglycemics on hold -Pt is on 70/30 insulin per home regimen -Hypoglycemic this AM while NPO with glucose in the 40's, corrected with oral glucose load -Pt currently with clear liquid diet, NPO after midnight for above endoscopy -Will reduce AM 70/30 to 5 units while NPO -Continue SSI coverage as needed  #3 hypertension:  -BP stable at present -Continue beta-blockers as tolerated.  #4  GERD:  -Will continue IV bid PPI  #5 history of pulmonary embolism:  -Had been on chronic anticoagulation prior to admit, now on hold given concern of acute blood loss anemia -Hemodynamically stable at this time -Repeat cbc in AM  DVT prophylaxis: SCD's Code Status: Full Family Communication: Pt in room, family not at bedside Disposition Plan: Home when hemoglobin stable and cleared by GI  Consultants:   GI  Procedures:     Antimicrobials: Anti-infectives (From admission, onward)   None       Subjective: Without complaints this AM  Objective: Vitals:   09/19/19 1027 09/19/19 1200 09/19/19 1352 09/19/19 1358  BP: 137/70 128/72 (!) 134/95 (!) 134/95  Pulse: (!) 57 (!) 59 66 60  Resp: (!) 24 16 16  (!) 24  Temp: 98.2 F (36.8 C) 98.2 F (36.8 C) 97.7 F (36.5 C) 98 F (36.7 C)  TempSrc:  Oral Oral   SpO2: 100% 100% 100% 100%  Weight:      Height:        Intake/Output Summary (Last 24 hours) at 09/19/2019 1541 Last data filed at 09/19/2019 0650 Gross per 24 hour  Intake 1513.15 ml  Output 6 ml  Net 1507.15 ml   Filed Weights   09/19/19 0020  Weight: 86 kg    Examination:  General exam: Appears calm and comfortable  Respiratory system: Clear to auscultation. Respiratory effort normal. Cardiovascular system: S1 & S2 heard, Regular Gastrointestinal system: Abdomen is nondistended, soft and nontender. No organomegaly or masses felt. Normal  bowel sounds heard. Central nervous system: Alert and oriented. No focal neurological deficits. Extremities: Symmetric 5 x 5 power. Skin: No rashes, lesions  Psychiatry: Judgement and insight appear normal. Mood & affect appropriate.   Data Reviewed: I have personally reviewed following labs and imaging studies  CBC: Recent Labs  Lab 09/18/19 2220 09/19/19 0056 09/19/19 0814 09/19/19 1152  WBC 9.0 8.6 6.8 8.7  NEUTROABS 5.3  --   --   --   HGB 8.0* 7.6* 9.1* 9.1*  HCT 28.1* 26.1* 29.9* 29.7*  MCV 79.8*  79.3* 79.9* 79.6*  PLT 355 312 282 [id number]   Basic Metabolic Panel: Recent Labs  Lab 09/18/19 2220 09/19/19 0056  NA 140 137  K 4.3 4.4  CL 111 112*  CO2 20* 19*  GLUCOSE 170* 140*  BUN 20 18  CREATININE 1.55* 1.35*  CALCIUM 8.8* 8.4*   GFR: Estimated Creatinine Clearance: 51.2 mL/min (A) (by C-G formula based on SCr of 1.35 mg/dL (H)). Liver Function Tests: Recent Labs  Lab 09/18/19 2220 09/19/19 0056  AST 17 15  ALT 16 14  ALKPHOS 74 66  BILITOT 0.2* 0.5  PROT 7.4 6.6  ALBUMIN 4.0 3.5   No results for input(s): LIPASE, AMYLASE in the last 168 hours. No results for input(s): AMMONIA in the last 168 hours. Coagulation Profile: No results for input(s): INR, PROTIME in the last 168 hours. Cardiac Enzymes: No results for input(s): CKTOTAL, CKMB, CKMBINDEX, TROPONINI in the last 168 hours. BNP (last 3 results) No results for input(s): PROBNP in the last 8760 hours. HbA1C: Recent Labs    09/19/19 0056  HGBA1C 7.5*   CBG: Recent Labs  Lab 09/19/19 0017 09/19/19 0810 09/19/19 1134 09/19/19 1156  GLUCAP 149* 109* 43* 147*   Lipid Profile: No results for input(s): CHOL, HDL, LDLCALC, TRIG, CHOLHDL, LDLDIRECT in the last 72 hours. Thyroid Function Tests: No results for input(s): TSH, T4TOTAL, FREET4, T3FREE, THYROIDAB in the last 72 hours. Anemia Panel: No results for input(s): VITAMINB12, FOLATE, FERRITIN, TIBC, IRON, RETICCTPCT in the last 72 hours. Sepsis Labs: No results for input(s): PROCALCITON, LATICACIDVEN in the last 168 hours.  Recent Results (from the past 240 hour(s))  Respiratory Panel by RT PCR (Flu A&B, Covid) - Nasopharyngeal Swab     Status: None   Collection Time: 09/18/19  8:52 PM   Specimen: Nasopharyngeal Swab  Result Value Ref Range Status   SARS Coronavirus 2 by RT PCR NEGATIVE NEGATIVE Final    Comment: (NOTE) SARS-CoV-2 target nucleic acids are NOT DETECTED. The SARS-CoV-2 RNA is generally detectable in upper respiratoy specimens  during the acute phase of infection. The lowest concentration of SARS-CoV-2 viral copies this assay can detect is 131 copies/mL. A negative result does not preclude SARS-Cov-2 infection and should not be used as the sole basis for treatment or other patient management decisions. A negative result may occur with  improper specimen collection/handling, submission of specimen other than nasopharyngeal swab, presence of viral mutation(s) within the areas targeted by this assay, and inadequate number of viral copies (<131 copies/mL). A negative result must be combined with clinical observations, patient history, and epidemiological information. The expected result is Negative. Fact Sheet for Patients:  PinkCheek.be Fact Sheet for Healthcare Providers:  GravelBags.it This test is not yet ap proved or cleared by the [place] FDA and  has been authorized for detection and/or diagnosis of SARS-CoV-2 by FDA under an Emergency Use Authorization (EUA). This EUA will remain  in effect (meaning this test  can be used) for the duration of the COVID-19 declaration under Section 564(b)(1) of the Act, 21 U.S.C. section 360bbb-3(b)(1), unless the authorization is terminated or revoked sooner.    Influenza A by PCR NEGATIVE NEGATIVE Final   Influenza B by PCR NEGATIVE NEGATIVE Final    Comment: (NOTE) The Xpert Xpress SARS-CoV-2/FLU/RSV assay is intended as an aid in  the diagnosis of influenza from Nasopharyngeal swab specimens and  should not be used as a sole basis for treatment. Nasal washings and  aspirates are unacceptable for Xpert Xpress SARS-CoV-2/FLU/RSV  testing. Fact Sheet for Patients: PinkCheek.be Fact Sheet for Healthcare Providers: GravelBags.it This test is not yet approved or cleared by the [place] FDA and  has been authorized for detection and/or diagnosis of  SARS-CoV-2 by  FDA under an Emergency Use Authorization (EUA). This EUA will remain  in effect (meaning this test can be used) for the duration of the  Covid-19 declaration under Section 564(b)(1) of the Act, 21  U.S.C. section 360bbb-3(b)(1), unless the authorization is  terminated or revoked. Performed at [facility], [place] [address], [place], [place] [address]      Radiology Studies: No results found.  Scheduled Meds: . atorvastatin  10 mg Oral Daily  . bisacodyl  10 mg Rectal Once  . insulin aspart  0-5 Units Subcutaneous QHS  . insulin aspart  0-9 Units Subcutaneous TID WC  . insulin aspart protamine- aspart  25 Units Subcutaneous BID WC  . [START ON 09/20/2019] insulin aspart protamine- aspart  5 Units Subcutaneous BID WC  . metoprolol succinate  25 mg Oral Daily  . pantoprazole (PROTONIX) IV  40 mg Intravenous Q12H  . polyethylene glycol-electrolytes  4,000 mL Oral Once  . vitamin B-12  100 mcg Oral Daily   Continuous Infusions: . sodium chloride 100 mL/hr at 09/19/19 0055     LOS: 1 day   Marylu Lund, MD [facility] Pager On Amion  If 7PM-7AM, please contact night-coverage 09/19/2019, 3:41 PM

## 2019-09-19 NOTE — Consult Note (Signed)
[facility] Consultation Note  Referring Provider: [facility] Primary Care Physician:  Jani Gravel, MD  Reason for Consultation:  hematochezia  HPI: Matthew Zimmerman is a 74 y.o. male whom we've been asked to see for hematochezia.  Patient admitted for a one-day history of abrupt onset hematochezia.  No abdominal pain, nausea, vomiting, unintentional weight loss, change in bowel habits.  Is on rivaroxaban for history of recurrent pulmonary emboli.  Had endoscopy and colonoscopy last in 2008 by Dr. Lajoyce Corners, colonoscopy showed small tubular adenoma at 20 cm.  Patient reports last episode of hematochezia was about 3 hours ago.   Past Medical History:  Diagnosis Date  . Carpal tunnel syndrome on both sides   . Chest pain   . Diabetes mellitus without complication ([place])   . GERD (gastroesophageal reflux disease)   . Headache   . Hypercholesteremia   . Hyperlipidemia   . PE (pulmonary embolism)   . Shortness of breath dyspnea    PE     Past Surgical History:  Procedure Laterality Date  . BILATERAL CARPAL TUNNEL RELEASE Bilateral   . COLONOSCOPY    . ELBOW SURGERY     BONE SPURS REMOVED B ELBOWS  . EYE SURGERY Bilateral    cataracts  . FOOT SURGERY     HEEL SPURS REMOVED B HEELS  . I & D EXTREMITY Right 09/18/2014   Procedure: IRRIGATION AND DEBRIDEMENT RIGHT THUMB, open fracture repair;  Surgeon: Linna Hoff, MD;  Location: [facility];  Service: Orthopedics;  Laterality: Right;  . LEG SURGERY Left    left thigh table saw accident, "cut to the bone", I&D/suturing  . SHOULDER SURGERY Left    Bone spur  . SPINE SURGERY  09/04/1985   lumbar spine  . TOE SURGERY      Prior to Admission medications   Medication Sig Start Date End Date Taking? Authorizing Provider  aspirin EC 81 MG tablet Take 81 mg by mouth daily.   Yes [provider]  atorvastatin (LIPITOR) 20 MG tablet Take 10 mg by mouth daily.    Yes [provider]  augmented betamethasone  dipropionate (DIPROLENE-AF) 0.05 % cream Apply 1 application topically daily as needed (itchy skin near bottom).  10/28/18  Yes [provider]  dapagliflozin propanediol (FARXIGA) 5 MG TABS tablet Take 5 mg by mouth daily.   Yes Jani Gravel, MD  diclofenac sodium (VOLTAREN) 1 % GEL Apply 1 application topically 4 (four) times daily as needed (pain).   Yes [provider]  insulin NPH-regular Human (NOVOLIN 70/30) (70-30) 100 UNIT/ML injection Inject 25-35 Units into the skin 2 (two) times daily with a meal. Humulin   Yes [provider]  metFORMIN (GLUCOPHAGE) 500 MG tablet Take 2 tablets by mouth twice daily 01/30/19  Yes [provider]  metoprolol succinate (TOPROL-XL) 25 MG 24 hr tablet Take 1 tablet (25 mg total) by mouth daily. Take with or immediately following a meal. Take at night. Patient taking differently: Take 25 mg by mouth daily. Take with or immediately following a meal. 06/17/19 09/18/19 Yes Miquel Dunn, NP  rivaroxaban (XARELTO) 20 MG TABS tablet Take 20 mg by mouth daily.    Yes [provider]  vitamin B-12 (CYANOCOBALAMIN) 100 MCG tablet Take 100 mcg by mouth daily.   Yes [provider]  Vitamin D, Ergocalciferol, (DRISDOL) 1.25 MG (50000 UNIT) CAPS capsule Take 50,000 Units by mouth every 7 (seven) days.   Yes [provider]  glucose  blood (ONE TOUCH ULTRA TEST) test strip Use to test blood sugar twice a day 12/12/18   [provider]  Lancets (ONETOUCH DELICA PLUS [id number]) [place] Use to test blood sugar twice daily 12/12/18   [provider]    Current Facility-Administered Medications  Medication Dose Route Frequency Provider Last Rate Last Admin  . 0.9 %  sodium chloride infusion   Intravenous Continuous Elwyn Reach, MD 100 mL/hr at 09/19/19 0055 New Bag at 09/19/19 0055  . atorvastatin (LIPITOR) tablet 10 mg  10 mg Oral Daily Gala Romney L, MD   10 mg at 09/19/19 1009  . insulin  aspart (novoLOG) injection 0-5 Units  0-5 Units Subcutaneous QHS Garba, Mohammad L, MD      . insulin aspart (novoLOG) injection 0-9 Units  0-9 Units Subcutaneous TID WC Garba, Mohammad L, MD      . insulin aspart protamine- aspart (NOVOLOG MIX 70/30) injection 25 Units  25 Units Subcutaneous BID WC Elwyn Reach, MD   25 Units at 09/19/19 0829  . metoprolol succinate (TOPROL-XL) 24 hr tablet 25 mg  25 mg Oral Daily Gala Romney L, MD   25 mg at 09/19/19 1009  . ondansetron (ZOFRAN) tablet 4 mg  4 mg Oral Q6H PRN Elwyn Reach, MD       Or  . ondansetron (ZOFRAN) injection 4 mg  4 mg Intravenous Q6H PRN Gala Romney L, MD      . pantoprazole (PROTONIX) injection 40 mg  40 mg Intravenous Q12H Gala Romney L, MD   40 mg at 09/19/19 1009  . vitamin B-12 (CYANOCOBALAMIN) tablet 100 mcg  100 mcg Oral Daily Gala Romney L, MD   100 mcg at 09/19/19 1009    Allergies as of 09/18/2019 - Review Complete 09/18/2019  Allergen Reaction Noted  . Cortisone Other (See Comments)     Family History  Problem Relation Age of Onset  . Cancer Mother   . Diabetes Father     Social History   Socioeconomic History  . Marital status: Married    Spouse name: Not on file  . Number of children: 2  . Years of education: Not on file  . Highest education level: Not on file  Occupational History  . Not on file  Tobacco Use  . Smoking status: Former Smoker    Packs/day: 0.50    Types: Cigarettes    Quit date: 1964    Years since quitting: 57.0  . Smokeless tobacco: Former Systems developer  . Tobacco comment: smoking since 74 years old  Substance and Sexual Activity  . Alcohol use: No  . Drug use: No  . Sexual activity: Not on file  Other Topics Concern  . Not on file  Social History Narrative   Lives with wife.[name]   He is retired from [employer], also Waylan Boga son age 30 years old died suddenly at client's home on 11/03/18- results of autopsy still pending on 03/20/19   Social  Determinants of Health   Financial Resource Strain:   . Difficulty of Paying Living Expenses: Not on file  Food Insecurity:   . Worried About Charity fundraiser in the Last Year: Not on file  . Ran Out of Food in the Last Year: Not on file  Transportation Needs:   . Lack of Transportation (Medical): Not on file  . Lack of Transportation (Non-Medical): Not on file  Physical Activity: Inactive  . Days of Exercise per Week: 0  days  . Minutes of Exercise per Session: 0 min  Stress: No Stress Concern Present  . Feeling of Stress : Only a little  Social Connections: Unknown  . Frequency of Communication with Friends and Family: More than three times a week  . Frequency of Social Gatherings with Friends and Family: Not on file  . Attends Religious Services: Not on file  . Active Member of Clubs or Organizations: Not on file  . Attends Archivist Meetings: Not on file  . Marital Status: Married  Human resources officer Violence:   . Fear of Current or Ex-Partner: Not on file  . Emotionally Abused: Not on file  . Physically Abused: Not on file  . Sexually Abused: Not on file    Review of Systems: As per HPI, all others negative  Physical Exam: Vital signs in last 24 hours: Temp:  [97.6 F (36.4 C)-98.8 F (37.1 C)] 98.2 F (36.8 C) (01/15 1027) Pulse Rate:  [40-143] 57 (01/15 1027) Resp:  [14-24] 24 (01/15 1027) BP: (109-155)/(42-88) 137/70 (01/15 1027) SpO2:  [92 %-100 %] 100 % (01/15 1027) Weight:  [86 kg] 86 kg (01/15 0020) Last BM Date: 09/19/19 General:   Alert,  Well-developed, well-nourished, pleasant and cooperative in NAD Head:  Normocephalic and atraumatic. Eyes:  Sclera clear, no icterus.   Conjunctiva pink. Ears:  Normal auditory acuity. Nose:  No deformity, discharge,  or lesions. Mouth:  No deformity or lesions.  Oropharynx pink & moist. Neck:  Supple; no masses or thyromegaly. Lungs:  Clear throughout to auscultation.   No wheezes, crackles, or rhonchi. No  acute distress. Heart:  Regular rate and rhythm; no murmurs, clicks, rubs,  or gallops. Abdomen:  Soft, protuberant, nontender and nondistended. No masses, hepatosplenomegaly or hernias noted. Normal bowel sounds, without guarding, and without rebound.     Msk:  Symmetrical without gross deformities. Normal posture. Pulses:  Normal pulses noted. Extremities:  Without clubbing or edema. Neurologic:  Alert and  oriented x4;  grossly normal neurologically. Skin:  Intact without significant lesions or rashes. Psych:  Alert and cooperative. Normal mood and affect.   Lab Results: Recent Labs    09/18/19 2220 09/19/19 0056 09/19/19 0814  WBC 9.0 8.6 6.8  HGB 8.0* 7.6* 9.1*  HCT 28.1* 26.1* 29.9*  PLT 355 312 282   BMET Recent Labs    09/18/19 2220 09/19/19 0056  NA 140 137  K 4.3 4.4  CL 111 112*  CO2 20* 19*  GLUCOSE 170* 140*  BUN 20 18  CREATININE 1.55* 1.35*  CALCIUM 8.8* 8.4*   LFT Recent Labs    09/19/19 0056  PROT 6.6  ALBUMIN 3.5  AST 15  ALT 14  ALKPHOS 66  BILITOT 0.5   PT/INR No results for input(s): LABPROT, INR in the last 72 hours.  Studies/Results: No results found.  Impression:  1.  Painless hematochezia.  Suspect colonic diverticular bleeding, but other entities can't be excluded. 2.  Acute blood loss anemia. 3.  Recurrent pulmonary emboli, on chronic rivaroxaban (last dose yesterday).  Plan:  1.  Supportive care:  Volume repletion, serial CBCs, blood transfusion as needed. 2.  Rivaroxaban on hold. 3.  Clear liquid diet now, NPO after midnight. 4.  Plan for colonoscopy tomorrow with Dr. Therisa Doyne; if rampant rebleeding before then, would do expedited tagged RBC study as next step in management. 5.  Risks (bleeding, infection, bowel perforation that could require surgery, sedation-related changes in cardiopulmonary systems), benefits (identification and possible treatment of  source of symptoms, exclusion of certain causes of symptoms), and  alternatives (watchful waiting, radiographic imaging studies, empiric medical treatment) of colonoscopy were explained to patient/family in detail and patient wishes to proceed.   LOS: 1 day   Rafael Salway M  09/19/2019, 11:02 AM  Cell [phone] If no answer or after 5 PM call [phone]

## 2019-09-19 NOTE — Plan of Care (Signed)
  Problem: Education: Goal: Ability to identify signs and symptoms of gastrointestinal bleeding will improve Outcome: Progressing   

## 2019-09-19 NOTE — Patient Outreach (Signed)
  [facility]) Care Management Chronic Special Needs Program    09/19/2019  Name: Matthew Zimmerman, DOB: 12/27/1945  MRN: [id number]   [name] is enrolled in a chronic special needs plan for Diabetes. [name] presented to the [facility] emergency department with chief complaint of hematochezia. He is on Xarelto 20 mg daily for previous pulmonary embolism (in 2014) He was admitted to [facility] on 09/19/19.  Plan: Individualized care plan will be sent to [facility] Utilization Management Department . RNCM will monitor progress and assist with transition of care process.   Kelli Churn RN, CCM, [place] Management Coordinator [facility] Care Management [phone]

## 2019-09-20 ENCOUNTER — Inpatient Hospital Stay (HOSPITAL_COMMUNITY): Payer: HMO | Admitting: Certified Registered Nurse Anesthetist

## 2019-09-20 ENCOUNTER — Encounter (HOSPITAL_COMMUNITY): Payer: Self-pay | Admitting: Internal Medicine

## 2019-09-20 ENCOUNTER — Encounter (HOSPITAL_COMMUNITY)
Admission: EM | Disposition: A | Discharge status: Home / Self Care | Payer: Self-pay | Source: Home / Self Care | Attending: Internal Medicine

## 2019-09-20 HISTORY — PX: HOT HEMOSTASIS: SHX5433

## 2019-09-20 HISTORY — PX: HEMOSTASIS CLIP PLACEMENT: SHX6857

## 2019-09-20 HISTORY — PX: COLONOSCOPY WITH PROPOFOL: SHX5780

## 2019-09-20 LAB — COMPREHENSIVE METABOLIC PANEL
ALT: 14 U/L (ref 0–44)
AST: 13 U/L — ABNORMAL LOW (ref 15–41)
Albumin: 3.4 g/dL — ABNORMAL LOW (ref 3.5–5.0)
Alkaline Phosphatase: 63 U/L (ref 38–126)
Anion gap: 6 (ref 5–15)
BUN: 9 mg/dL (ref 8–23)
CO2: 19 mmol/L — ABNORMAL LOW (ref 22–32)
Calcium: 8.2 mg/dL — ABNORMAL LOW (ref 8.9–10.3)
Chloride: 114 mmol/L — ABNORMAL HIGH (ref 98–111)
Creatinine, Ser: 0.99 mg/dL (ref 0.61–1.24)
GFR calc Af Amer: >60 mL/min (ref >60)
GFR calc non Af Amer: >60 mL/min (ref >60)
Glucose, Bld: 142 mg/dL — ABNORMAL HIGH (ref 70–99)
Potassium: 4.5 mmol/L (ref 3.5–5.1)
Sodium: 139 mmol/L (ref 135–145)
Total Bilirubin: 1 mg/dL (ref 0.3–1.2)
Total Protein: 6.4 g/dL — ABNORMAL LOW (ref 6.5–8.1)

## 2019-09-20 LAB — CBC
HCT: 30.3 % — ABNORMAL LOW (ref 39.0–52.0)
Hemoglobin: 9.1 g/dL — ABNORMAL LOW (ref 13.0–17.0)
MCH: 23.9 pg — ABNORMAL LOW (ref 26.0–34.0)
MCHC: 30 g/dL (ref 30.0–36.0)
MCV: 79.5 fL — ABNORMAL LOW (ref 80.0–100.0)
Platelets: 284 10*3/uL (ref 150–400)
RBC: 3.81 MIL/uL — ABNORMAL LOW (ref 4.22–5.81)
RDW: 16.2 % — ABNORMAL HIGH (ref 11.5–15.5)
WBC: 7 10*3/uL (ref 4.0–10.5)
nRBC: 0 % (ref 0.0–0.2)

## 2019-09-20 LAB — GLUCOSE, CAPILLARY
Glucose-Capillary: 133 mg/dL — ABNORMAL HIGH (ref 70–99)
Glucose-Capillary: 118 mg/dL — ABNORMAL HIGH (ref 70–99)
Glucose-Capillary: 173 mg/dL — ABNORMAL HIGH (ref 70–99)
Glucose-Capillary: 98 mg/dL (ref 70–99)

## 2019-09-20 SURGERY — COLONOSCOPY WITH PROPOFOL
Anesthesia: Monitor Anesthesia Care

## 2019-09-20 MED ORDER — PANTOPRAZOLE SODIUM 40 MG IV SOLR
40.0000 mg | INTRAVENOUS | Status: DC
  Administered 2019-09-21: 40 mg via INTRAVENOUS
  Filled 2019-09-20: qty 40

## 2019-09-20 MED ORDER — LIDOCAINE 2% (20 MG/ML) 5 ML SYRINGE
INTRAMUSCULAR | Status: DC | PRN
  Administered 2019-09-20: 60 mg via INTRAVENOUS

## 2019-09-20 MED ORDER — PROPOFOL 500 MG/50ML IV EMUL
INTRAVENOUS | Status: DC | PRN
  Administered 2019-09-20: 80 mg via INTRAVENOUS
  Administered 2019-09-20: 150 ug/kg/min via INTRAVENOUS

## 2019-09-20 MED ORDER — INSULIN ASPART PROT & ASPART (70-30 MIX) 100 UNIT/ML ~~LOC~~ SUSP
10.0000 [IU] | Freq: Two times a day (BID) | SUBCUTANEOUS | Status: DC
  Administered 2019-09-20 – 2019-09-21 (×2): 10 [IU] via SUBCUTANEOUS
  Filled 2019-09-20: qty 10

## 2019-09-20 MED ORDER — LACTATED RINGERS IV SOLN
INTRAVENOUS | Status: AC | PRN
  Administered 2019-09-20: 1000 mL via INTRAVENOUS

## 2019-09-20 SURGICAL SUPPLY — 22 items
ELECT REM PT RETURN 9FT ADLT (ELECTROSURGICAL)
ELECTRODE REM PT RTRN 9FT ADLT (ELECTROSURGICAL) IMPLANT
FCP BXJMBJMB 240X2.8X (CUTTING FORCEPS)
FLOOR PAD 36X40 (MISCELLANEOUS) ×4
FORCEPS BIOP RAD 4 LRG CAP 4 (CUTTING FORCEPS) IMPLANT
FORCEPS BIOP RJ4 240 W/NDL (CUTTING FORCEPS)
FORCEPS BXJMBJMB 240X2.8X (CUTTING FORCEPS) IMPLANT
INJECTOR/SNARE I SNARE (MISCELLANEOUS) IMPLANT
LUBRICANT JELLY 4.5OZ STERILE (MISCELLANEOUS) IMPLANT
MANIFOLD NEPTUNE II (INSTRUMENTS) IMPLANT
NDL SCLEROTHERAPY 25GX240 (NEEDLE) IMPLANT
NEEDLE SCLEROTHERAPY 25GX240 (NEEDLE) IMPLANT
PAD FLOOR 36X40 (MISCELLANEOUS) ×3 IMPLANT
PROBE APC STR FIRE (PROBE) IMPLANT
PROBE INJECTION GOLD (MISCELLANEOUS)
PROBE INJECTION GOLD 7FR (MISCELLANEOUS) IMPLANT
SNARE ROTATE MED OVAL 20MM (MISCELLANEOUS) IMPLANT
SYR 50ML LL SCALE MARK (SYRINGE) IMPLANT
TRAP SPECIMEN MUCOUS 40CC (MISCELLANEOUS) IMPLANT
TUBING ENDO SMARTCAP PENTAX (MISCELLANEOUS) IMPLANT
TUBING IRRIGATION ENDOGATOR (MISCELLANEOUS) ×4 IMPLANT
WATER STERILE IRR 1000ML POUR (IV SOLUTION) IMPLANT

## 2019-09-20 NOTE — Op Note (Signed)
[facility] Patient Name: Matthew Zimmerman Procedure Date: 09/20/2019 MRN: [id number] Attending MD: Ronnette Juniper , MD Date of Birth: May 11, 1946 CSN: [id number] Age: 74 Admit Type: Inpatient Procedure:                Colonoscopy Indications:              Last colonoscopy: 2017, Hematochezia Providers:                Ronnette Juniper, MD, Glori Bickers, RN, Elspeth Cho                            Tech., Technician, Eliberto Ivory, CRNA Referring MD:             [facility] Medicines:                Monitored Anesthesia Care Complications:            No immediate complications. Estimated blood loss:                            Minimal. Estimated Blood Loss:     Estimated blood loss was minimal. Procedure:                Pre-Anesthesia Assessment:                           - Prior to the procedure, a History and Physical                            was performed, and patient medications and                            allergies were reviewed. The patient's tolerance of                            previous anesthesia was also reviewed. The risks                            and benefits of the procedure and the sedation                            options and risks were discussed with the patient.                            All questions were answered, and informed consent                            was obtained. Prior Anticoagulants: The patient has                            taken Xarelto (rivaroxaban), last dose was 2 days                            prior to procedure. ASA Grade Assessment: III - A  patient with severe systemic disease. After                            reviewing the risks and benefits, the patient was                            deemed in satisfactory condition to undergo the                            procedure.                           After obtaining informed consent, the colonoscope                            was passed under  direct vision. Throughout the                            procedure, the patient's blood pressure, pulse, and                            oxygen saturations were monitored continuously. The                            [id number] Olympus pediatric colonscope                            was introduced through the anus and advanced to the                            the terminal ileum. The colonoscopy was performed                            without difficulty. The patient tolerated the                            procedure well. The quality of the bowel                            preparation was fair. Scope In: 10:51:15 AM Scope Out: 11:12:45 AM Scope Withdrawal Time: 0 hours 18 minutes 40 seconds  Total Procedure Duration: 0 hours 21 minutes 30 seconds  Findings:      The perianal and digital rectal examinations were normal.      A single medium-sized localized angioectasia with typical arborization       was found in the cecum. Coagulation for tissue destruction using argon       plasma at 0.5 liters/minute and 15 watts was successful. For hemostasis,       two hemostatic clips were successfully placed (MR conditional). There       was no bleeding at the end of the procedure.      A single medium-sized localized angioectasia with typical arborization       was found in the transverse colon. Coagulation for tissue destruction       using argon plasma at 0.5 liters/minute and 15 watts was  successful. For       hemostasis, two hemostatic clips were successfully placed (MR       conditional). There was no bleeding at the end of the procedure.      The exam was otherwise without abnormality on direct and retroflexion       views.      The terminal ileum appeared normal. Impression:               - Preparation of the colon was fair.                           - A single colonic angioectasia. Treated with argon                            plasma coagulation (APC). Clips (MR conditional)                             were placed.                           - A single colonic angioectasia. Treated with argon                            plasma coagulation (APC). Clips (MR conditional)                            were placed.                           - The examination was otherwise normal on direct                            and retroflexion views.                           - The examined portion of the ileum was normal.                           - No specimens collected. Moderate Sedation:      Patient did not receive moderate sedation for this procedure, but       instead received monitored anesthesia care. Recommendation:           - Resume regular diet.                           - Resume Xarelto (rivaroxaban) at prior dose in 3                            days. Procedure Code(s):        --- Professional ---                           [phone], Colonoscopy, flexible; with ablation of                            tumor(s), polyp(s), or other lesion(s) (includes  pre- and post-dilation and guide wire passage, when                            performed) Diagnosis Code(s):        --- Professional ---                           K55.20, Angiodysplasia of colon without hemorrhage                           K92.1, Melena (includes Hematochezia) CPT copyright 2019 American Medical Association. All rights reserved. The codes documented in this report are preliminary and upon coder review may  be revised to meet current compliance requirements. Ronnette Juniper, MD 09/20/2019 11:19:03 AM This report has been signed electronically. Number of Addenda: 0

## 2019-09-20 NOTE — Anesthesia Preprocedure Evaluation (Signed)
Anesthesia Evaluation  Patient identified by MRN, date of birth, ID band Patient awake    Reviewed: Allergy & Precautions, H&P , NPO status , Patient's Chart, lab work & pertinent test results  Airway Mallampati: II   Neck ROM: full    Dental   Pulmonary shortness of breath, former smoker,  H/o PE. Currently takes xarelto   breath sounds clear to auscultation       Cardiovascular hypertension,  Rhythm:regular Rate:Normal     Neuro/Psych  Headaches,  Neuromuscular disease    GI/Hepatic GERD  ,GI bleeding   Endo/Other  diabetes, Type 2  Renal/GU      Musculoskeletal   Abdominal   Peds  Hematology  (+) Blood dyscrasia, anemia ,   Anesthesia Other Findings   Reproductive/Obstetrics                             Anesthesia Physical Anesthesia Plan  ASA: III  Anesthesia Plan: MAC   Post-op Pain Management:    Induction: Intravenous  PONV Risk Score and Plan: 1 and Propofol infusion and Treatment may vary due to age or medical condition  Airway Management Planned: Nasal Cannula  Additional Equipment:   Intra-op Plan:   Post-operative Plan:   Informed Consent: I have reviewed the patients History and Physical, chart, labs and discussed the procedure including the risks, benefits and alternatives for the proposed anesthesia with the patient or authorized representative who has indicated his/her understanding and acceptance.       Plan Discussed with: CRNA, Surgeon and Anesthesiologist  Anesthesia Plan Comments:         Anesthesia Quick Evaluation

## 2019-09-20 NOTE — Op Note (Signed)
Colonoscopy was performed for painless hematochezia, last dosage of Xarelto was 09/18/2019.   Findings: A medium sized AVM found in the cecum, treated with APC at 0.5 L/min and 15 W, 2 endoclips placed. A medium sized AVM noted in the transverse colon, treated with APC of 0.5 L/min at 15 W, 2 endoclips placed. Normal-appearing terminal ileum. Rest of the colon appeared unremarkable. Prep was fair.   Recommendations: Resume regular diet. Okay to DC home from GI standpoint in a.m.. Recommend resuming Xarelto after 3 days. Patient to follow-up with Dr. Paulita Fujita as an outpatient.   Ronnette Juniper, MD

## 2019-09-20 NOTE — Interval H&P Note (Signed)
History and Physical Interval Note: 73/male with hematochezia, on Xarelto for PE (last dose 2 days ago) for a colonoscopy and possible EGD(if colonoscopy is unremarkable).  09/20/2019 10:41 AM  Matthew Zimmerman  has presented today for colonoscopy, possible EGD, with the diagnosis of hematochezia, anemia.  The various methods of treatment have been discussed with the patient and family. After consideration of risks, benefits and other options for treatment, the patient has consented to  Procedure(s): COLONOSCOPY WITH PROPOFOL (Left) as a surgical intervention.  The patient's history has been reviewed, patient examined, no change in status, stable for surgery.  I have reviewed the patient's chart and labs.  Questions were answered to the patient's satisfaction.     Ronnette Juniper

## 2019-09-20 NOTE — Transfer of Care (Signed)
Immediate Anesthesia Transfer of Care Note  Patient: Matthew Zimmerman  Procedure(s) Performed: Procedure(s): COLONOSCOPY WITH PROPOFOL (Left) HOT HEMOSTASIS (ARGON PLASMA COAGULATION/BICAP) (N/A) HEMOSTASIS CLIP PLACEMENT  Patient Location: PACU and Endoscopy Unit  Anesthesia Type:General  Level of Consciousness: awake, alert  and oriented  Airway & Oxygen Therapy: Patient Spontanous Breathing and Patient connected to nasal cannula oxygen  Post-op Assessment: Report given to RN and Post -op Vital signs reviewed and stable  Post vital signs: Reviewed and stable  Last Vitals:  Vitals:   09/20/19 0525 09/20/19 1041  BP: 118/73 (!) 157/70  Pulse: 61 (!) 57  Resp: 18 18  Temp: 36.8 C 36.7 C  SpO2: [id number]    Complications: No apparent anesthesia complications

## 2019-09-20 NOTE — Brief Op Note (Signed)
09/18/2019 - 09/20/2019  11:19 AM  PATIENT:  Matthew Zimmerman  74 y.o. male  PRE-OPERATIVE DIAGNOSIS:  hematochezia, anemia  POST-OPERATIVE DIAGNOSIS:  AVM at cecum, APC with 2 clips placed, transverse AVM-APC with 2 clips placed.   PROCEDURE:  Procedure(s): COLONOSCOPY WITH PROPOFOL (Left) HOT HEMOSTASIS (ARGON PLASMA COAGULATION/BICAP) (N/A) HEMOSTASIS CLIP PLACEMENT  SURGEON:  Surgeon(s) and Role:    Ronnette Juniper, MD - Primary  PHYSICIAN ASSISTANT:   ASSISTANTS: Glori Bickers, RN, Elspeth Cho, Tech   ANESTHESIA:   MAC  EBL:  minimal  BLOOD ADMINISTERED:none  DRAINS: none   LOCAL MEDICATIONS USED:  NONE  SPECIMEN:  No Specimen  DISPOSITION OF SPECIMEN:  N/A  COUNTS:  YES  TOURNIQUET:  * No tourniquets in log *  DICTATION: .Dragon Dictation  PLAN OF CARE: Admit to inpatient   PATIENT DISPOSITION:  PACU - hemodynamically stable.   Delay start of Pharmacological VTE agent (>24hrs) due to surgical blood loss or risk of bleeding: yes

## 2019-09-20 NOTE — Progress Notes (Signed)
PROGRESS NOTE    Matthew Zimmerman  [id number] DOB: March 13, 1946 DOA: 09/18/2019 PCP: Jani Gravel, MD    Brief Narrative:  74 y.o. male with medical history significant of diabetes, hypertension, GERD, pulmonary embolism currently on Xarelto and hyperlipidemia who came to the ER complaining of rectal bleed that occurred today.  He reported large amount of blood and clots.  Patient's baseline hemoglobin is around 12 g and hemoglobin here is 8 g.  He feels slightly lightheaded.  Denied any passing out.  Denied any NSAIDs use.  Patient continues to take Xarelto for pulmonary embolism.  No fever or chills.  No hematemesis.  No abdominal pain at this point.  Just some cramping.  Patient is being admitted with GI bleed most likely worsened by his anticoagulation.  GI has been consulted..  ED Course: Temperature 98.8 blood pressure 155/68 pulse 143 respirate of 18 oxygen sat 92% room air.  White count 9.0 hemoglobin 8.0 and platelets of 355.  Chemistry is stable.  COVID-19 screening is pending.  Patient being admitted with GI bleed for work-up.  Dr. Michail Sermon has been consulted  Assessment & Plan:   Principal Problem:   Rectal bleed Active Problems:   Diabetes ([place])   Essential hypertension   GERD   Pulmonary embolism ([place])   Anemia due to acute blood loss   #1 rectal bleed secondary to AVM:  -GI consulted, appreciate input. Pt underwent colonoscopy 1/16 with findings of AVM in the cecum treated with APC and clips and AVM in transverse colon treated with APC and clips  -Pt is s/p 2 units PRBC's this visit -Repeat CBC in AM  #2 diabetes:  -Oral hyperglycemics on hold -Pt is on 70/30 insulin per home regimen, noted to be hypoglycemic recently thus insulin was held -Pt no longer NPO. Will resume 70/30 at lower dose of 10units BID -Continue SSI coverage as needed  #3 hypertension:  -BP stable at present -Continue beta-blockers as pt tolerates  #4 GERD:  -Will continue IV bid  PPI  #5 history of pulmonary embolism:  -Had been on chronic anticoagulation prior to admit, now on hold given concern of acute blood loss anemia -Hemodynamically stable at this time -recheck cbc in AM  DVT prophylaxis: SCD's Code Status: Full Family Communication: Pt in room, family not at bedside Disposition Plan: Home when hemoglobin stable and cleared by GI  Consultants:   GI  Procedures:   Colonoscopy 1/16  Antimicrobials: Anti-infectives (From admission, onward)   None      Subjective: No complaints at this time  Objective: Vitals:   09/20/19 1119 09/20/19 1120 09/20/19 1130 09/20/19 1138  BP: (!) 100/53 (!) 109/55 (!) 108/55 (!) 114/59  Pulse: (!) 54 (!) 50 (!) 52 (!) 56  Resp: 17 19 12 19   Temp: (!) 97.3 F (36.3 C)     TempSrc: Temporal     SpO2: 100% 100% 100% 100%  Weight:      Height:        Intake/Output Summary (Last 24 hours) at 09/20/2019 1449 Last data filed at 09/20/2019 1330 Gross per 24 hour  Intake 3617.18 ml  Output 7 ml  Net 3610.18 ml   Filed Weights   09/19/19 0020 09/20/19 1041  Weight: 86 kg 86 kg    Examination: General exam: Awake, laying in bed, in nad Respiratory system: Normal respiratory effort, no wheezing Cardiovascular system: regular rate, s1, s2 Gastrointestinal system: Soft, nondistended, positive BS Central nervous system: CN2-12 grossly intact, strength intact Extremities: Perfused,  no clubbing Skin: Normal skin turgor, no notable skin lesions seen Psychiatry: Mood normal // no visual hallucinations   Data Reviewed: I have personally reviewed following labs and imaging studies  CBC: Recent Labs  Lab 09/18/19 2220 09/18/19 2220 09/19/19 0056 09/19/19 0814 09/19/19 1152 09/19/19 1802 09/20/19 0520  WBC 9.0   < > 8.6 6.8 8.7 6.4 7.0  NEUTROABS 5.3  --   --   --   --   --   --   HGB 8.0*   < > 7.6* 9.1* 9.1* 8.9* 9.1*  HCT 28.1*   < > 26.1* 29.9* 29.7* 29.5* 30.3*  MCV 79.8*   < > 79.3* 79.9* 79.6*  80.2 79.5*  PLT 355   < > 312 282 281 270 284   < > = values in this interval not displayed.   Basic Metabolic Panel: Recent Labs  Lab 09/18/19 2220 09/19/19 0056 09/20/19 0520  NA 140 137 139  K 4.3 4.4 4.5  CL 111 112* 114*  CO2 20* 19* 19*  GLUCOSE 170* 140* 142*  BUN 20 18 9   CREATININE 1.55* 1.35* 0.99  CALCIUM 8.8* 8.4* 8.2*   GFR: Estimated Creatinine Clearance: 69.8 mL/min (by C-G formula based on SCr of 0.99 mg/dL). Liver Function Tests: Recent Labs  Lab 09/18/19 2220 09/19/19 0056 09/20/19 0520  AST 17 15 13*  ALT 16 14 14   ALKPHOS 74 66 63  BILITOT 0.2* 0.5 1.0  PROT 7.4 6.6 6.4*  ALBUMIN 4.0 3.5 3.4*   No results for input(s): LIPASE, AMYLASE in the last 168 hours. No results for input(s): AMMONIA in the last 168 hours. Coagulation Profile: No results for input(s): INR, PROTIME in the last 168 hours. Cardiac Enzymes: No results for input(s): CKTOTAL, CKMB, CKMBINDEX, TROPONINI in the last 168 hours. BNP (last 3 results) No results for input(s): PROBNP in the last 8760 hours. HbA1C: Recent Labs    09/19/19 0056  HGBA1C 7.5*   CBG: Recent Labs  Lab 09/19/19 1156 09/19/19 1705 09/19/19 2224 09/20/19 0816 09/20/19 1212  GLUCAP 147* 73 105* 133* 118*   Lipid Profile: No results for input(s): CHOL, HDL, LDLCALC, TRIG, CHOLHDL, LDLDIRECT in the last 72 hours. Thyroid Function Tests: No results for input(s): TSH, T4TOTAL, FREET4, T3FREE, THYROIDAB in the last 72 hours. Anemia Panel: No results for input(s): VITAMINB12, FOLATE, FERRITIN, TIBC, IRON, RETICCTPCT in the last 72 hours. Sepsis Labs: No results for input(s): PROCALCITON, LATICACIDVEN in the last 168 hours.  Recent Results (from the past 240 hour(s))  Respiratory Panel by RT PCR (Flu A&B, Covid) - Nasopharyngeal Swab     Status: None   Collection Time: 09/18/19  8:52 PM   Specimen: Nasopharyngeal Swab  Result Value Ref Range Status   SARS Coronavirus 2 by RT PCR NEGATIVE NEGATIVE  Final    Comment: (NOTE) SARS-CoV-2 target nucleic acids are NOT DETECTED. The SARS-CoV-2 RNA is generally detectable in upper respiratoy specimens during the acute phase of infection. The lowest concentration of SARS-CoV-2 viral copies this assay can detect is 131 copies/mL. A negative result does not preclude SARS-Cov-2 infection and should not be used as the sole basis for treatment or other patient management decisions. A negative result may occur with  improper specimen collection/handling, submission of specimen other than nasopharyngeal swab, presence of viral mutation(s) within the areas targeted by this assay, and inadequate number of viral copies (<131 copies/mL). A negative result must be combined with clinical observations, patient history, and epidemiological information. The expected  result is Negative. Fact Sheet for Patients:  PinkCheek.be Fact Sheet for Healthcare Providers:  GravelBags.it This test is not yet ap proved or cleared by the [place] FDA and  has been authorized for detection and/or diagnosis of SARS-CoV-2 by FDA under an Emergency Use Authorization (EUA). This EUA will remain  in effect (meaning this test can be used) for the duration of the COVID-19 declaration under Section 564(b)(1) of the Act, 21 U.S.C. section 360bbb-3(b)(1), unless the authorization is terminated or revoked sooner.    Influenza A by PCR NEGATIVE NEGATIVE Final   Influenza B by PCR NEGATIVE NEGATIVE Final    Comment: (NOTE) The Xpert Xpress SARS-CoV-2/FLU/RSV assay is intended as an aid in  the diagnosis of influenza from Nasopharyngeal swab specimens and  should not be used as a sole basis for treatment. Nasal washings and  aspirates are unacceptable for Xpert Xpress SARS-CoV-2/FLU/RSV  testing. Fact Sheet for Patients: PinkCheek.be Fact Sheet for Healthcare  Providers: GravelBags.it This test is not yet approved or cleared by the [place] FDA and  has been authorized for detection and/or diagnosis of SARS-CoV-2 by  FDA under an Emergency Use Authorization (EUA). This EUA will remain  in effect (meaning this test can be used) for the duration of the  Covid-19 declaration under Section 564(b)(1) of the Act, 21  U.S.C. section 360bbb-3(b)(1), unless the authorization is  terminated or revoked. Performed at [facility], [place] [address], [place], [place] [address]      Radiology Studies: No results found.  Scheduled Meds: . atorvastatin  10 mg Oral Daily  . insulin aspart  0-5 Units Subcutaneous QHS  . insulin aspart  0-9 Units Subcutaneous TID WC  . insulin aspart protamine- aspart  10 Units Subcutaneous BID WC  . metoprolol succinate  25 mg Oral Daily  . [START ON 09/21/2019] pantoprazole (PROTONIX) IV  40 mg Intravenous Q24H  . vitamin B-12  100 mcg Oral Daily   Continuous Infusions:    LOS: 2 days   Marylu Lund, MD [facility] Pager On Amion  If 7PM-7AM, please contact night-coverage 09/20/2019, 2:49 PM

## 2019-09-21 DIAGNOSIS — K922 Gastrointestinal hemorrhage, unspecified: Secondary | ICD-10-CM

## 2019-09-21 LAB — CBC
HCT: 32.9 % — ABNORMAL LOW (ref 39.0–52.0)
Hemoglobin: 9.5 g/dL — ABNORMAL LOW (ref 13.0–17.0)
MCH: 23.3 pg — ABNORMAL LOW (ref 26.0–34.0)
MCHC: 28.9 g/dL — ABNORMAL LOW (ref 30.0–36.0)
MCV: 80.8 fL (ref 80.0–100.0)
Platelets: 322 10*3/uL (ref 150–400)
RBC: 4.07 MIL/uL — ABNORMAL LOW (ref 4.22–5.81)
RDW: 16.3 % — ABNORMAL HIGH (ref 11.5–15.5)
WBC: 8.4 10*3/uL (ref 4.0–10.5)
nRBC: 0 % (ref 0.0–0.2)

## 2019-09-21 LAB — GLUCOSE, CAPILLARY
Glucose-Capillary: 176 mg/dL — ABNORMAL HIGH (ref 70–99)
Glucose-Capillary: 184 mg/dL — ABNORMAL HIGH (ref 70–99)

## 2019-09-21 NOTE — Progress Notes (Signed)
All discharge information including medications and follow up appts discussed with patient. All personal belongings gathered including patients cell phone/ clothes and wallet and sent with patient. Pt called grandson to come pick him up.

## 2019-09-21 NOTE — Discharge Summary (Signed)
Physician Discharge Summary  Matthew Zimmerman [id number] DOB: Nov 26, 1945 DOA: 09/18/2019  PCP: Jani Gravel, MD  Admit date: 09/18/2019 Discharge date: 09/21/2019  Admitted From: Home Disposition:  Home  Recommendations for Outpatient Follow-up:  1. Follow up with PCP in 1-2 weeks 2. Pt to resume xarelto on 1/19  Discharge Condition:Stable CODE STATUS:Full Diet recommendation: Diabetic   Brief/Interim Summary: 74 y.o.malewith medical history significant ofdiabetes, hypertension, GERD, pulmonary embolism currently on Xarelto and hyperlipidemia who came to the ER complaining of rectal bleed that occurred today. He reported large amount of blood and clots. Patient's baseline hemoglobin is around 12 g and hemoglobin here is 8 g. He feels slightly lightheaded. Denied any passing out. Denied any NSAIDs use. Patient continues to take Xarelto for pulmonary embolism. No fever or chills. No hematemesis. No abdominal pain at this point. Just some cramping. Patient is being admitted with GI bleed most likely worsened by his anticoagulation. GI has been consulted.[name]  Discharge Diagnoses:  Principal Problem:   Rectal bleed Active Problems:   Diabetes ([place])   Essential hypertension   GERD   Pulmonary embolism (HCC)   Anemia due to acute blood loss   #1 rectal bleed secondary to AVM: -GI consulted, appreciate input. Pt underwent colonoscopy 1/16 with findings of AVM in the cecum treated with APC and clips and AVM in transverse colon treated with APC and clips  -Pt is s/p 2 units PRBC's this visit -Repeat CBC stable -OK for d/c today per GI. Recommendation to hold xarelto x 3 days after procedure (resume on 1/19)  #2 diabetes: -Oral hyperglycemics on hold -Pt is on 70/30 insulin per home regimen, noted to be hypoglycemic recently thus insulin was held -Lower dose of insulin resumed with good glycemic control -Will continue home dose of insulin on d/c. A1c of 7.5  #3  hypertension: -BP stable at present -Continue beta-blockers as pt tolerates  #4 GERD: -Will continue IV bid PPI  #5 history of pulmonary embolism: -Had been on chronic anticoagulation prior to admit, now on hold given concern of acute blood loss anemia -Hemodynamically stable at this time -Resume xarelto on 1/19 per above  Discharge Instructions   Allergies as of 09/21/2019      Reactions   Cortisone Other (See Comments)   CBG thrown out of control      Medication List    TAKE these medications   aspirin EC 81 MG tablet Take 81 mg by mouth daily.   atorvastatin 20 MG tablet Commonly known as: LIPITOR Take 10 mg by mouth daily.   augmented betamethasone dipropionate 0.05 % cream Commonly known as: DIPROLENE-AF Apply 1 application topically daily as needed (itchy skin near bottom).   diclofenac sodium 1 % Gel Commonly known as: VOLTAREN Apply 1 application topically 4 (four) times daily as needed (pain).   Farxiga 5 MG Tabs tablet Generic drug: dapagliflozin propanediol Take 5 mg by mouth daily.   insulin NPH-regular Human (70-30) 100 UNIT/ML injection Inject 25-35 Units into the skin 2 (two) times daily with a meal. Humulin   metFORMIN 500 MG tablet Commonly known as: GLUCOPHAGE Take 2 tablets by mouth twice daily   metoprolol succinate 25 MG 24 hr tablet Commonly known as: TOPROL-XL Take 1 tablet (25 mg total) by mouth daily. Take with or immediately following a meal. Take at night. What changed: additional instructions   ONE TOUCH ULTRA TEST test strip Generic drug: glucose blood Use to test blood sugar twice a day   OneTouch Delica Plus  Lancet33G Misc Use to test blood sugar twice daily   rivaroxaban 20 MG Tabs tablet Commonly known as: XARELTO Take 20 mg by mouth daily.   vitamin B-12 100 MCG tablet Commonly known as: CYANOCOBALAMIN Take 100 mcg by mouth daily.   Vitamin D (Ergocalciferol) 1.25 MG (50000 UNIT) Caps capsule Commonly known  as: DRISDOL Take 50,000 Units by mouth every 7 (seven) days.      Follow-up Information    Jani Gravel, MD. Schedule an appointment as soon as possible for a visit in 2 week(s).   Specialty: Internal Medicine Contact information: [place] [address] [phone]          Allergies  Allergen Reactions  . Cortisone Other (See Comments)    CBG thrown out of control    Consultations:  GI  Procedures/Studies:  No results found.  Subjective: Eager to go home  Discharge Exam: Vitals:   09/21/19 0521 09/21/19 0905  BP: 126/65 118/66  Pulse: (!) 55 61  Resp:    Temp: 98.1 F (36.7 C)   SpO2: 99%    Vitals:   09/20/19 1542 09/20/19 1955 09/21/19 0521 09/21/19 0905  BP: 111/66 133/75 126/65 118/66  Pulse: 67 (!) 59 (!) 55 61  Resp: (!) 21 18    Temp: 98 F (36.7 C) 98.4 F (36.9 C) 98.1 F (36.7 C)   TempSrc: Oral Oral Oral   SpO2: 100% 98% 99%   Weight:      Height:        General: Pt is alert, awake, not in acute distress Cardiovascular: RRR, S1/S2 +, no rubs, no gallops Respiratory: CTA bilaterally, no wheezing, no rhonchi Abdominal: Soft, NT, ND, bowel sounds + Extremities: no edema, no cyanosis   The results of significant diagnostics from this hospitalization (including imaging, microbiology, ancillary and laboratory) are listed below for reference.     Microbiology: Recent Results (from the past 240 hour(s))  Respiratory Panel by RT PCR (Flu A&B, Covid) - Nasopharyngeal Swab     Status: None   Collection Time: 09/18/19  8:52 PM   Specimen: Nasopharyngeal Swab  Result Value Ref Range Status   SARS Coronavirus 2 by RT PCR NEGATIVE NEGATIVE Final    Comment: (NOTE) SARS-CoV-2 target nucleic acids are NOT DETECTED. The SARS-CoV-2 RNA is generally detectable in upper respiratoy specimens during the acute phase of infection. The lowest concentration of SARS-CoV-2 viral copies this assay can detect is 131 copies/mL. A  negative result does not preclude SARS-Cov-2 infection and should not be used as the sole basis for treatment or other patient management decisions. A negative result may occur with  improper specimen collection/handling, submission of specimen other than nasopharyngeal swab, presence of viral mutation(s) within the areas targeted by this assay, and inadequate number of viral copies (<131 copies/mL). A negative result must be combined with clinical observations, patient history, and epidemiological information. The expected result is Negative. Fact Sheet for Patients:  PinkCheek.be Fact Sheet for Healthcare Providers:  GravelBags.it This test is not yet ap proved or cleared by the [place] FDA and  has been authorized for detection and/or diagnosis of SARS-CoV-2 by FDA under an Emergency Use Authorization (EUA). This EUA will remain  in effect (meaning this test can be used) for the duration of the COVID-19 declaration under Section 564(b)(1) of the Act, 21 U.S.C. section 360bbb-3(b)(1), unless the authorization is terminated or revoked sooner.    Influenza A by PCR NEGATIVE NEGATIVE Final  Influenza B by PCR NEGATIVE NEGATIVE Final    Comment: (NOTE) The Xpert Xpress SARS-CoV-2/FLU/RSV assay is intended as an aid in  the diagnosis of influenza from Nasopharyngeal swab specimens and  should not be used as a sole basis for treatment. Nasal washings and  aspirates are unacceptable for Xpert Xpress SARS-CoV-2/FLU/RSV  testing. Fact Sheet for Patients: PinkCheek.be Fact Sheet for Healthcare Providers: GravelBags.it This test is not yet approved or cleared by the [place] FDA and  has been authorized for detection and/or diagnosis of SARS-CoV-2 by  FDA under an Emergency Use Authorization (EUA). This EUA will remain  in effect (meaning this test can be used) for  the duration of the  Covid-19 declaration under Section 564(b)(1) of the Act, 21  U.S.C. section 360bbb-3(b)(1), unless the authorization is  terminated or revoked. Performed at [facility], [place] [address] [place], [place] [address]      Labs: BNP (last 3 results) No results for input(s): BNP in the last 8760 hours. Basic Metabolic Panel: Recent Labs  Lab 09/18/19 2220 09/19/19 0056 09/20/19 0520  NA 140 137 139  K 4.3 4.4 4.5  CL 111 112* 114*  CO2 20* 19* 19*  GLUCOSE 170* 140* 142*  BUN 20 18 9   CREATININE 1.55* 1.35* 0.99  CALCIUM 8.8* 8.4* 8.2*   Liver Function Tests: Recent Labs  Lab 09/18/19 2220 09/19/19 0056 09/20/19 0520  AST 17 15 13*  ALT 16 14 14   ALKPHOS 74 66 63  BILITOT 0.2* 0.5 1.0  PROT 7.4 6.6 6.4*  ALBUMIN 4.0 3.5 3.4*   No results for input(s): LIPASE, AMYLASE in the last 168 hours. No results for input(s): AMMONIA in the last 168 hours. CBC: Recent Labs  Lab 09/18/19 2220 09/19/19 0056 09/19/19 0814 09/19/19 1152 09/19/19 1802 09/20/19 0520 09/21/19 0453  WBC 9.0   < > 6.8 8.7 6.4 7.0 8.4  NEUTROABS 5.3  --   --   --   --   --   --   HGB 8.0*   < > 9.1* 9.1* 8.9* 9.1* 9.5*  HCT 28.1*   < > 29.9* 29.7* 29.5* 30.3* 32.9*  MCV 79.8*   < > 79.9* 79.6* 80.2 79.5* 80.8  PLT 355   < > 282 281 270 284 322   < > = values in this interval not displayed.   Cardiac Enzymes: No results for input(s): CKTOTAL, CKMB, CKMBINDEX, TROPONINI in the last 168 hours. BNP: Invalid input(s): POCBNP CBG: Recent Labs  Lab 09/20/19 1212 09/20/19 1630 09/20/19 2003 09/21/19 0820 09/21/19 1134  GLUCAP 118* 173* 98 176* 184*   D-Dimer No results for input(s): DDIMER in the last 72 hours. Hgb A1c Recent Labs    09/19/19 0056  HGBA1C 7.5*   Lipid Profile No results for input(s): CHOL, HDL, LDLCALC, TRIG, CHOLHDL, LDLDIRECT in the last 72 hours. Thyroid function studies No results for input(s): TSH, T4TOTAL, T3FREE,  THYROIDAB in the last 72 hours.  Invalid input(s): FREET3 Anemia work up No results for input(s): VITAMINB12, FOLATE, FERRITIN, TIBC, IRON, RETICCTPCT in the last 72 hours. Urinalysis    Component Value Date/Time   COLORURINE AMBER (A) 05/19/2014 1814   APPEARANCEUR CLEAR 05/19/2014 1814   LABSPEC 1.029 05/19/2014 1814   PHURINE 5.0 05/19/2014 1814   GLUCOSEU NEGATIVE 05/19/2014 1814   HGBUR NEGATIVE 05/19/2014 1814   BILIRUBINUR SMALL (A) 05/19/2014 1814   BILIRUBINUR neg 10/15/2012 1358   KETONESUR 15 (A) 05/19/2014 1814   PROTEINUR  100 (A) 05/19/2014 1814   UROBILINOGEN 1.0 05/19/2014 1814   NITRITE NEGATIVE 05/19/2014 1814   LEUKOCYTESUR NEGATIVE 05/19/2014 1814   Sepsis Labs Invalid input(s): PROCALCITONIN,  WBC,  LACTICIDVEN Microbiology Recent Results (from the past 240 hour(s))  Respiratory Panel by RT PCR (Flu A&B, Covid) - Nasopharyngeal Swab     Status: None   Collection Time: 09/18/19  8:52 PM   Specimen: Nasopharyngeal Swab  Result Value Ref Range Status   SARS Coronavirus 2 by RT PCR NEGATIVE NEGATIVE Final    Comment: (NOTE) SARS-CoV-2 target nucleic acids are NOT DETECTED. The SARS-CoV-2 RNA is generally detectable in upper respiratoy specimens during the acute phase of infection. The lowest concentration of SARS-CoV-2 viral copies this assay can detect is 131 copies/mL. A negative result does not preclude SARS-Cov-2 infection and should not be used as the sole basis for treatment or other patient management decisions. A negative result may occur with  improper specimen collection/handling, submission of specimen other than nasopharyngeal swab, presence of viral mutation(s) within the areas targeted by this assay, and inadequate number of viral copies (<131 copies/mL). A negative result must be combined with clinical observations, patient history, and epidemiological information. The expected result is Negative. Fact Sheet for Patients:   PinkCheek.be Fact Sheet for Healthcare Providers:  GravelBags.it This test is not yet ap proved or cleared by the [place] FDA and  has been authorized for detection and/or diagnosis of SARS-CoV-2 by FDA under an Emergency Use Authorization (EUA). This EUA will remain  in effect (meaning this test can be used) for the duration of the COVID-19 declaration under Section 564(b)(1) of the Act, 21 U.S.C. section 360bbb-3(b)(1), unless the authorization is terminated or revoked sooner.    Influenza A by PCR NEGATIVE NEGATIVE Final   Influenza B by PCR NEGATIVE NEGATIVE Final    Comment: (NOTE) The Xpert Xpress SARS-CoV-2/FLU/RSV assay is intended as an aid in  the diagnosis of influenza from Nasopharyngeal swab specimens and  should not be used as a sole basis for treatment. Nasal washings and  aspirates are unacceptable for Xpert Xpress SARS-CoV-2/FLU/RSV  testing. Fact Sheet for Patients: PinkCheek.be Fact Sheet for Healthcare Providers: GravelBags.it This test is not yet approved or cleared by the [place] FDA and  has been authorized for detection and/or diagnosis of SARS-CoV-2 by  FDA under an Emergency Use Authorization (EUA). This EUA will remain  in effect (meaning this test can be used) for the duration of the  Covid-19 declaration under Section 564(b)(1) of the Act, 21  U.S.C. section 360bbb-3(b)(1), unless the authorization is  terminated or revoked. Performed at [facility], [place] [address], [place], [place] [address]    Time spent: 30 min  SIGNED:   Marylu Lund, MD  [facility] 09/21/2019, 12:09 PM  If 7PM-7AM, please contact night-coverage

## 2019-09-21 NOTE — Discharge Instructions (Signed)
Resume your Xarelto on 1/19

## 2019-09-21 NOTE — Plan of Care (Signed)

## 2019-09-22 ENCOUNTER — Encounter: Payer: Self-pay | Admitting: *Deleted

## 2019-09-22 ENCOUNTER — Other Ambulatory Visit: Payer: Self-pay | Admitting: *Deleted

## 2019-09-22 LAB — BPAM RBC
Blood Product Expiration Date: 202101302359
Blood Product Expiration Date: 202101312359
ISSUE DATE / TIME: 202101150058
ISSUE DATE / TIME: 202101150411
Unit Type and Rh: 6200
Unit Type and Rh: 6200

## 2019-09-22 LAB — TYPE AND SCREEN
ABO/RH(D): A POS
Antibody Screen: NEGATIVE
Unit division: 0
Unit division: 0

## 2019-09-22 NOTE — Patient Outreach (Addendum)
Matthew Zimmerman) Care Management Chronic Special Needs Program  09/22/2019  Name: Matthew Zimmerman DOB: 01/26/1946  MRN: [id number]  Matthew Zimmerman is enrolled in a chronic special needs plan for Diabetes. Reviewed and updated care plan. He was hospitalized at [facility] from 1/14-1/17/21 for lower GI bleed secondary to two colonic angioectasia.   Subjective: Reached client at home number, 2 HIPAA identifiers verified; completed transition of care call.  Matthew Zimmerman states he is doing well, denies any problems post Zimmerman discharge. Says he will call Matthew Zimmerman, his primary care provider, to arrange a follow up appointment later this week as today is a holiday Matthew Zimmerman).  He says he does not know the results of his colonoscopy or his Covid test done while hospitalized. Client reports his Hgb A1C was 7.0% when Matthew Zimmerman checked it on 08/07/19. He says he does not know if it was checked in the Zimmerman. He reports his fasting CBG this morning as 113 and his pre dinner CBG on 1/17 as 261. He says he administered 40 units of 70/30 insulin prior to dinner.  Client says he has all prescribed medications and knows to delay taking his Xarelto until 09/22/18. He accepted the offer of two medication boxes as he did not receive one in July.  He says he would like to know the results of his Covid test and plans to receive the vaccine when it is available. He says his wife was able to schedule an appointment for herself to receive the vaccine on 10/06/19.  Matthew Zimmerman gave this Matthew Zimmerman verbal permission to speak to his wife if he is not home during an outreach.   Goals Addressed            This Visit's Progress     Patient Stated   . COMPLETED: "I don't know what caused the bleeding in my bowel" (pt-stated)       09/22/19 Results of colonoscopy done 09/20/19 explained to client and client voiced good understanding of explanation    . "Keep my A1C under 7.0%"  (pt-stated)   Not on track    Reviewed results of most recent Hgb A1C on 09/19/19 of 7.5% while client was hospitalized    . Remain free of Covid 19 infection for rest of the year (pt-stated)   On track    09/22/19 Client states he has not contracted Covid and per his request discussed the results of his Covid test done 09/18/19 which was negative. He says he will get the Covid vaccine when it is available. Advised him to listen to the news regarding area vaccine clinics and that this Matthew Zimmerman will notify him of any vaccination opportunities and when appointments are available.       Other   . COMPLETED:  Acknowledge receipt of Advanced Directive package   On track    03/20/19 Client states he received the packet but does not wish to complete an advance directive     . Client understands the importance of follow-up with providers by attending scheduled visits   On track    Client states he is consistent in keeping provider appointments    . COMPLETED: Client will report ability to obtain Medications within next 4-5 months   On track    Client states he is not struggling with medication copays at this time    . COMPLETED: Client will report no worsening of symptoms of Atrial Fibrillation within the next 4 months  On track    Client without complaints related to his heart and has been in sinus rhythm for several years so atrial fibrillation is not an active problem    . Client will verbalize knowledge of self management of Hypertension as evidences by BP reading of 140/90 or less; or as defined by provider   On track    Client  states he does not monitor his blood pressure at home but says he does takes his medicine as directed.  Review of his blood pressure readings in [facility] ([employer]- point of care tool) over the last year, demonstrate his readings are meeting target    . General - Client will not be readmitted within 30 days (C-SNP)       09/22/19 Transition of care call completed  including problems requiring provider notification. See transition of care template for details.     [name] HEMOGLOBIN A1C < 7.0       Reviewed diabetes self management actions:  Glucose monitoring per provider recommendations  Perform Quality checks on blood meter  Eat Healthy  Check feet daily  Visit provider every 3-6 months as directed  Hbg A1C level every 3-6 months.  Eye Exam yearly    . Maintain timely refills of diabetic medication as prescribed within the year .   On track    Client states he refills his diabetic medications as prescribed and review of medication dispense record supports client's statement    . COMPLETED: Obtain annual  Lipid Profile, LDL-C   On track    Lipid profile completed 08/07/19 and all elements meet target    . COMPLETED: Obtain Annual Eye (retinal)  Exam    On track    Diabetic eye exam completed on 07/22/19    . Obtain Annual Foot Exam   Not on track    09/22/19 Client denies skin impairment on feet but says he has not established care with a podiatrist or asked provider to prescribe diabetic shoes. Unable to find results of diabetic foot exam in the electronic medial record or in [place] ([employer] Now- point of care tool)  Reviewed health insurance benefits related to specialist copay and diabetic shoe copay.     . COMPLETED: Obtain annual screen for micro albuminuria (urine) , nephropathy (kidney problems)   On track    Urine for protein test completed on 02/21/19    . COMPLETED: Obtain Hemoglobin A1C at least 2 times per year   On track    Hgb A1C test completed on 02/21/19 7.8%, 05/23/19 7.45, 08/07/19 7.0% and 09/19/19 while hospitalized 7.5%    . COMPLETED: Visit Primary Care Provider or Endocrinologist at least 2 times per year    On track    Client completed appointments with primary care provider on 02/21/19, 03/05/19, 07/11/19 and 08/07/19. He says he will call to make Zimmerman follow up appointment with Matthew Zimmerman after [facility] this week.        Assessment: Client recently hospitalized for lower GI bleed related to two colonic ( cecum and transverse) angioectasia, treated with argon plasma coagulation (APC) and clipping.   Client is not meeting diabetes self-management goal of hemoglobin A1C of <7% with most recent reading of 7.5% on 09/19/19 while hospitalized, previously 7.0% on 08/07/19.  Client has good understanding of:  COVID-19 cause, symptoms, precautions (social distancing, stay at home order, hand washing, wear face covering when unable to maintain or ensure 6 foot social distancing), and symptoms  requiring provider notification.  Plan:   Send successful outreach letter with a copy of their updated individualized care plan to client  Send individual care plan to provider Chronic care management coordinator will outreach in:  1 Month  Kelli Churn RN, CCM, [place] Management Coordinator [facility] [facility] [phone]     .

## 2019-09-25 NOTE — Anesthesia Postprocedure Evaluation (Signed)
Anesthesia Post Note  Patient: Zaydan Papesh  Procedure(s) Performed: COLONOSCOPY WITH PROPOFOL (Left ) HOT HEMOSTASIS (ARGON PLASMA COAGULATION/BICAP) (N/A ) HEMOSTASIS CLIP PLACEMENT     Patient location during evaluation: Endoscopy Anesthesia Type: MAC Level of consciousness: awake and alert Pain management: pain level controlled Vital Signs Assessment: post-procedure vital signs reviewed and stable Respiratory status: spontaneous breathing, nonlabored ventilation, respiratory function stable and patient connected to nasal cannula oxygen Cardiovascular status: blood pressure returned to baseline and stable Postop Assessment: no apparent nausea or vomiting Anesthetic complications: no    Last Vitals:  Vitals:   09/21/19 0905 09/21/19 1237  BP: 118/66 120/71  Pulse: 61 67  Resp:  16  Temp:  36.7 C  SpO2:  100%    Last Pain:  Vitals:   09/21/19 1237  TempSrc: Oral  PainSc:                  [place]

## 2019-09-29 DIAGNOSIS — D649 Anemia, unspecified: Secondary | ICD-10-CM | POA: Diagnosis not present

## 2019-09-30 ENCOUNTER — Other Ambulatory Visit: Payer: Self-pay

## 2019-09-30 ENCOUNTER — Emergency Department (HOSPITAL_COMMUNITY): Payer: HMO

## 2019-09-30 ENCOUNTER — Other Ambulatory Visit: Payer: Self-pay | Admitting: *Deleted

## 2019-09-30 ENCOUNTER — Inpatient Hospital Stay (HOSPITAL_COMMUNITY)
Admission: EM | Admit: 2019-09-30 | Discharge: 2019-10-05 | DRG: 394 | Disposition: A | Discharge status: Home Health | Payer: HMO | Attending: Internal Medicine | Admitting: Internal Medicine

## 2019-09-30 ENCOUNTER — Encounter (HOSPITAL_COMMUNITY): Payer: Self-pay | Admitting: Emergency Medicine

## 2019-09-30 DIAGNOSIS — K219 Gastro-esophageal reflux disease without esophagitis: Secondary | ICD-10-CM | POA: Diagnosis present

## 2019-09-30 DIAGNOSIS — K922 Gastrointestinal hemorrhage, unspecified: Secondary | ICD-10-CM | POA: Diagnosis present

## 2019-09-30 DIAGNOSIS — I4510 Unspecified right bundle-branch block: Secondary | ICD-10-CM | POA: Diagnosis not present

## 2019-09-30 DIAGNOSIS — Z833 Family history of diabetes mellitus: Secondary | ICD-10-CM | POA: Diagnosis not present

## 2019-09-30 DIAGNOSIS — R110 Nausea: Secondary | ICD-10-CM | POA: Diagnosis not present

## 2019-09-30 DIAGNOSIS — D62 Acute posthemorrhagic anemia: Secondary | ICD-10-CM | POA: Diagnosis present

## 2019-09-30 DIAGNOSIS — M545 Low back pain: Secondary | ICD-10-CM | POA: Diagnosis not present

## 2019-09-30 DIAGNOSIS — R569 Unspecified convulsions: Secondary | ICD-10-CM | POA: Diagnosis not present

## 2019-09-30 DIAGNOSIS — Z7982 Long term (current) use of aspirin: Secondary | ICD-10-CM

## 2019-09-30 DIAGNOSIS — Z888 Allergy status to other drugs, medicaments and biological substances status: Secondary | ICD-10-CM

## 2019-09-30 DIAGNOSIS — K633 Ulcer of intestine: Secondary | ICD-10-CM | POA: Diagnosis not present

## 2019-09-30 DIAGNOSIS — M25561 Pain in right knee: Secondary | ICD-10-CM | POA: Diagnosis not present

## 2019-09-30 DIAGNOSIS — K625 Hemorrhage of anus and rectum: Secondary | ICD-10-CM

## 2019-09-30 DIAGNOSIS — M25571 Pain in right ankle and joints of right foot: Secondary | ICD-10-CM | POA: Diagnosis not present

## 2019-09-30 DIAGNOSIS — Z20822 Contact with and (suspected) exposure to covid-19: Secondary | ICD-10-CM | POA: Diagnosis not present

## 2019-09-30 DIAGNOSIS — E7800 Pure hypercholesterolemia, unspecified: Secondary | ICD-10-CM | POA: Diagnosis present

## 2019-09-30 DIAGNOSIS — Z7901 Long term (current) use of anticoagulants: Secondary | ICD-10-CM | POA: Diagnosis not present

## 2019-09-30 DIAGNOSIS — Z87891 Personal history of nicotine dependence: Secondary | ICD-10-CM | POA: Diagnosis not present

## 2019-09-30 DIAGNOSIS — Z794 Long term (current) use of insulin: Secondary | ICD-10-CM

## 2019-09-30 DIAGNOSIS — Z9842 Cataract extraction status, left eye: Secondary | ICD-10-CM

## 2019-09-30 DIAGNOSIS — I2699 Other pulmonary embolism without acute cor pulmonale: Secondary | ICD-10-CM | POA: Diagnosis not present

## 2019-09-30 DIAGNOSIS — Z86711 Personal history of pulmonary embolism: Secondary | ICD-10-CM

## 2019-09-30 DIAGNOSIS — Z79899 Other long term (current) drug therapy: Secondary | ICD-10-CM | POA: Diagnosis not present

## 2019-09-30 DIAGNOSIS — Z9841 Cataract extraction status, right eye: Secondary | ICD-10-CM | POA: Diagnosis not present

## 2019-09-30 DIAGNOSIS — K5520 Angiodysplasia of colon without hemorrhage: Secondary | ICD-10-CM | POA: Diagnosis present

## 2019-09-30 DIAGNOSIS — M25461 Effusion, right knee: Secondary | ICD-10-CM | POA: Diagnosis not present

## 2019-09-30 DIAGNOSIS — I10 Essential (primary) hypertension: Secondary | ICD-10-CM | POA: Diagnosis present

## 2019-09-30 DIAGNOSIS — K640 First degree hemorrhoids: Secondary | ICD-10-CM | POA: Diagnosis present

## 2019-09-30 DIAGNOSIS — K921 Melena: Secondary | ICD-10-CM | POA: Diagnosis not present

## 2019-09-30 DIAGNOSIS — E785 Hyperlipidemia, unspecified: Secondary | ICD-10-CM | POA: Diagnosis not present

## 2019-09-30 DIAGNOSIS — Z7409 Other reduced mobility: Secondary | ICD-10-CM

## 2019-09-30 DIAGNOSIS — S99911A Unspecified injury of right ankle, initial encounter: Secondary | ICD-10-CM | POA: Diagnosis not present

## 2019-09-30 DIAGNOSIS — N181 Chronic kidney disease, stage 1: Secondary | ICD-10-CM | POA: Diagnosis present

## 2019-09-30 DIAGNOSIS — R55 Syncope and collapse: Secondary | ICD-10-CM | POA: Diagnosis present

## 2019-09-30 DIAGNOSIS — R52 Pain, unspecified: Secondary | ICD-10-CM

## 2019-09-30 DIAGNOSIS — Z03818 Encounter for observation for suspected exposure to other biological agents ruled out: Secondary | ICD-10-CM | POA: Diagnosis not present

## 2019-09-30 DIAGNOSIS — R001 Bradycardia, unspecified: Secondary | ICD-10-CM | POA: Diagnosis not present

## 2019-09-30 DIAGNOSIS — I82409 Acute embolism and thrombosis of unspecified deep veins of unspecified lower extremity: Secondary | ICD-10-CM | POA: Diagnosis not present

## 2019-09-30 DIAGNOSIS — Z86718 Personal history of other venous thrombosis and embolism: Secondary | ICD-10-CM

## 2019-09-30 DIAGNOSIS — M549 Dorsalgia, unspecified: Secondary | ICD-10-CM | POA: Diagnosis not present

## 2019-09-30 DIAGNOSIS — M7989 Other specified soft tissue disorders: Secondary | ICD-10-CM | POA: Diagnosis not present

## 2019-09-30 DIAGNOSIS — E1122 Type 2 diabetes mellitus with diabetic chronic kidney disease: Secondary | ICD-10-CM | POA: Diagnosis present

## 2019-09-30 DIAGNOSIS — D649 Anemia, unspecified: Secondary | ICD-10-CM | POA: Diagnosis not present

## 2019-09-30 DIAGNOSIS — R58 Hemorrhage, not elsewhere classified: Secondary | ICD-10-CM | POA: Diagnosis not present

## 2019-09-30 LAB — COMPREHENSIVE METABOLIC PANEL
ALT: 12 U/L (ref 0–44)
AST: 16 U/L (ref 15–41)
Albumin: 2.9 g/dL — ABNORMAL LOW (ref 3.5–5.0)
Alkaline Phosphatase: 53 U/L (ref 38–126)
Anion gap: 7 (ref 5–15)
BUN: 16 mg/dL (ref 8–23)
CO2: 23 mmol/L (ref 22–32)
Calcium: 8.1 mg/dL — ABNORMAL LOW (ref 8.9–10.3)
Chloride: 112 mmol/L — ABNORMAL HIGH (ref 98–111)
Creatinine, Ser: 1.27 mg/dL — ABNORMAL HIGH (ref 0.61–1.24)
GFR calc Af Amer: >60 mL/min (ref >60)
GFR calc non Af Amer: 55 mL/min — ABNORMAL LOW (ref >60)
Glucose, Bld: 193 mg/dL — ABNORMAL HIGH (ref 70–99)
Potassium: 4.1 mmol/L (ref 3.5–5.1)
Sodium: 142 mmol/L (ref 135–145)
Total Bilirubin: 0.6 mg/dL (ref 0.3–1.2)
Total Protein: 5.4 g/dL — ABNORMAL LOW (ref 6.5–8.1)

## 2019-09-30 LAB — GLUCOSE, CAPILLARY
Glucose-Capillary: 112 mg/dL — ABNORMAL HIGH (ref 70–99)
Glucose-Capillary: 129 mg/dL — ABNORMAL HIGH (ref 70–99)
Glucose-Capillary: 132 mg/dL — ABNORMAL HIGH (ref 70–99)

## 2019-09-30 LAB — CBC WITH DIFFERENTIAL/PLATELET
Abs Immature Granulocytes: 0.03 10*3/uL (ref 0.00–0.07)
Basophils Absolute: 0.1 10*3/uL (ref 0.0–0.1)
Basophils Relative: 1 %
Eosinophils Absolute: 0.3 10*3/uL (ref 0.0–0.5)
Eosinophils Relative: 5 %
HCT: 26.3 % — ABNORMAL LOW (ref 39.0–52.0)
Hemoglobin: 7.5 g/dL — ABNORMAL LOW (ref 13.0–17.0)
Immature Granulocytes: 1 %
Lymphocytes Relative: 37 %
Lymphs Abs: 2.5 10*3/uL (ref 0.7–4.0)
MCH: 23.4 pg — ABNORMAL LOW (ref 26.0–34.0)
MCHC: 28.5 g/dL — ABNORMAL LOW (ref 30.0–36.0)
MCV: 81.9 fL (ref 80.0–100.0)
Monocytes Absolute: 0.6 10*3/uL (ref 0.1–1.0)
Monocytes Relative: 9 %
Neutro Abs: 3.1 10*3/uL (ref 1.7–7.7)
Neutrophils Relative %: 47 %
Platelets: 273 10*3/uL (ref 150–400)
RBC: 3.21 MIL/uL — ABNORMAL LOW (ref 4.22–5.81)
RDW: 16.4 % — ABNORMAL HIGH (ref 11.5–15.5)
WBC: 6.6 10*3/uL (ref 4.0–10.5)
nRBC: 0 % (ref 0.0–0.2)

## 2019-09-30 LAB — RESPIRATORY PANEL BY RT PCR (FLU A&B, COVID)
Influenza A by PCR: NEGATIVE
Influenza B by PCR: NEGATIVE
SARS Coronavirus 2 by RT PCR: NEGATIVE

## 2019-09-30 LAB — PREPARE RBC (CROSSMATCH)

## 2019-09-30 LAB — TROPONIN I (HIGH SENSITIVITY)
Troponin I (High Sensitivity): 3 ng/L (ref <18)
Troponin I (High Sensitivity): 7 ng/L (ref <18)

## 2019-09-30 LAB — POC OCCULT BLOOD, ED: Fecal Occult Bld: POSITIVE — AB

## 2019-09-30 LAB — HEMOGLOBIN AND HEMATOCRIT, BLOOD
HCT: 26.7 % — ABNORMAL LOW (ref 39.0–52.0)
Hemoglobin: 8.3 g/dL — ABNORMAL LOW (ref 13.0–17.0)

## 2019-09-30 MED ORDER — ONDANSETRON HCL 4 MG PO TABS: 4.0000 mg | ORAL_TABLET | Freq: Four times a day (QID) | ORAL | Status: DC | PRN

## 2019-09-30 MED ORDER — ASPIRIN EC 81 MG PO TBEC: 81.0000 mg | DELAYED_RELEASE_TABLET | Freq: Every day | ORAL | Status: DC

## 2019-09-30 MED ORDER — MORPHINE SULFATE (PF) 2 MG/ML IV SOLN
1.0000 mg | INTRAVENOUS | Status: DC | PRN
  Administered 2019-09-30 (×2): 1 mg via INTRAVENOUS
  Filled 2019-09-30 (×2): qty 1

## 2019-09-30 MED ORDER — VITAMIN D (ERGOCALCIFEROL) 1.25 MG (50000 UNIT) PO CAPS: 50000.0000 [IU] | ORAL_CAPSULE | ORAL | Status: DC

## 2019-09-30 MED ORDER — INSULIN ASPART 100 UNIT/ML ~~LOC~~ SOLN
0.0000 [IU] | Freq: Three times a day (TID) | SUBCUTANEOUS | Status: DC
  Administered 2019-09-30 – 2019-10-01 (×3): 2 [IU] via SUBCUTANEOUS
  Administered 2019-10-01: 3 [IU] via SUBCUTANEOUS

## 2019-09-30 MED ORDER — SODIUM CHLORIDE 0.9 % IV SOLN: INTRAVENOUS | Status: DC

## 2019-09-30 MED ORDER — SODIUM CHLORIDE 0.9% FLUSH: 3.0000 mL | INTRAVENOUS | Status: DC | PRN

## 2019-09-30 MED ORDER — SODIUM CHLORIDE 0.9% IV SOLUTION: Freq: Once | INTRAVENOUS | Status: DC

## 2019-09-30 MED ORDER — ATORVASTATIN CALCIUM 10 MG PO TABS
10.0000 mg | ORAL_TABLET | Freq: Every day | ORAL | Status: DC
  Administered 2019-09-30 – 2019-10-05 (×6): 10 mg via ORAL
  Filled 2019-09-30 (×6): qty 1

## 2019-09-30 MED ORDER — SODIUM CHLORIDE 0.9 % IV BOLUS
500.0000 mL | Freq: Once | INTRAVENOUS | Status: AC
  Administered 2019-09-30: 500 mL via INTRAVENOUS

## 2019-09-30 MED ORDER — TRAMADOL HCL 50 MG PO TABS
50.0000 mg | ORAL_TABLET | Freq: Four times a day (QID) | ORAL | Status: DC | PRN
  Administered 2019-09-30 – 2019-10-05 (×8): 50 mg via ORAL
  Filled 2019-09-30 (×8): qty 1

## 2019-09-30 MED ORDER — SODIUM CHLORIDE 0.9 % IV SOLN: 250.0000 mL | INTRAVENOUS | Status: DC | PRN

## 2019-09-30 MED ORDER — ONDANSETRON HCL 4 MG/2ML IJ SOLN: 4.0000 mg | Freq: Four times a day (QID) | INTRAMUSCULAR | Status: DC | PRN

## 2019-09-30 MED ORDER — VITAMIN B-12 100 MCG PO TABS
100.0000 ug | ORAL_TABLET | Freq: Every day | ORAL | Status: DC
  Administered 2019-09-30 – 2019-10-05 (×6): 100 ug via ORAL
  Filled 2019-09-30 (×6): qty 1

## 2019-09-30 MED ORDER — SODIUM CHLORIDE 0.9% FLUSH: 3.0000 mL | Freq: Two times a day (BID) | INTRAVENOUS | Status: DC

## 2019-09-30 MED ORDER — INSULIN ASPART 100 UNIT/ML ~~LOC~~ SOLN: 0.0000 [IU] | Freq: Every day | SUBCUTANEOUS | Status: DC

## 2019-09-30 NOTE — ED Triage Notes (Signed)
Pt presents to ED via [facility] from home after having a bright red BM, followed by an episode of syncope. Pt did not fall, he lowered himself to the ground before he passed out. Pt also had 1 episode of vomiting with EMS, the gave 4 mg zofran IV. Pt has a hx of the same 10 days ago and has recently started back on his Xarelto.

## 2019-09-30 NOTE — H&P (Signed)
History and Physical    Matthew Zimmerman [id number] DOB: 06-04-1946 DOA: 09/30/2019  PCP: Jani Gravel, MD  Patient coming from: Home  Chief Complaint: Bright red blood per rectum and syncopal episode  HPI: Matthew Zimmerman is a 74 y.o. male with medical history significant of prior GI bleed, hyperlipidemia, pulmonary emboli on Xarelto unknown timeframe but over a year.  Recent discharge for GI bleed on September 21, 2019 underwent colonoscopy found to have AVM which was clipped required 2 units of blood transfusion.  He resumed his Xarelto on September 23, 2019.  Patient tonight had a very large bloody bowel movement bright red blood after which she had a syncopal episode.  EMS was called and he vomited once when he tried to get up.  Denies any fevers.  Denies any abdominal pain.  He is taken to Xarelto.  Patient is referred for admission for acute GI bleed.  Hemoglobin is dropped to 7.5 from 9.5 in the last couple days.  Also being transfused 2 units of blood in the emergency department being prepared.  Review of Systems: As per HPI otherwise 10 point review of systems negative.   Past Medical History:  Diagnosis Date   Carpal tunnel syndrome on both sides    Chest pain    Diabetes mellitus without complication (HCC)    GERD (gastroesophageal reflux disease)    Headache    Hypercholesteremia    Hyperlipidemia    PE (pulmonary embolism)    Shortness of breath dyspnea    PE     Past Surgical History:  Procedure Laterality Date   BILATERAL CARPAL TUNNEL RELEASE Bilateral    COLONOSCOPY     COLONOSCOPY WITH PROPOFOL Left 09/20/2019   Procedure: COLONOSCOPY WITH PROPOFOL;  Surgeon: Ronnette Juniper, MD;  Location: [facility];  Service: Gastroenterology;  Laterality: Left;   ELBOW SURGERY     BONE SPURS REMOVED B ELBOWS   EYE SURGERY Bilateral    cataracts   FOOT SURGERY     HEEL SPURS REMOVED B HEELS   HEMOSTASIS CLIP PLACEMENT  09/20/2019   Procedure: HEMOSTASIS CLIP  PLACEMENT;  Surgeon: Ronnette Juniper, MD;  Location: [facility];  Service: Gastroenterology;;   HOT HEMOSTASIS N/A 09/20/2019   Procedure: HOT HEMOSTASIS (ARGON PLASMA COAGULATION/BICAP);  Surgeon: Ronnette Juniper, MD;  Location: [facility];  Service: Gastroenterology;  Laterality: N/A;   I & D EXTREMITY Right 09/18/2014   Procedure: IRRIGATION AND DEBRIDEMENT RIGHT THUMB, open fracture repair;  Surgeon: Linna Hoff, MD;  Location: [place];  Service: Orthopedics;  Laterality: Right;   LEG SURGERY Left    left thigh table saw accident, "cut to the bone", I&D/suturing   SHOULDER SURGERY Left    Bone spur   SPINE SURGERY  09/04/1985   lumbar spine   TOE SURGERY       reports that he quit smoking about 57 years ago. His smoking use included cigarettes. He smoked 0.50 packs per day. He has quit using smokeless tobacco. He reports that he does not drink alcohol or use drugs.  Allergies  Allergen Reactions   Cortisone Other (See Comments)    CBG thrown out of control    Family History  Problem Relation Age of Onset   Cancer Mother    Diabetes Father     Prior to Admission medications   Medication Sig Start Date End Date Taking? Authorizing Provider  aspirin EC 81 MG tablet Take 81 mg by mouth daily.   Yes [provider]  atorvastatin (LIPITOR) 20 MG tablet Take 10 mg by mouth daily.    Yes [provider]  augmented betamethasone dipropionate (DIPROLENE-AF) 0.05 % cream Apply 1 application topically daily as needed (itchy skin near bottom).  10/28/18  Yes [provider]  clotrimazole-betamethasone (LOTRISONE) cream Apply 1 application topically 2 (two) times daily.  07/11/19  Yes [provider]  dapagliflozin propanediol (FARXIGA) 5 MG TABS tablet Take 5 mg by mouth daily.   Yes Jani Gravel, MD  diclofenac sodium (VOLTAREN) 1 % GEL Apply 1 application topically 4 (four) times daily as needed (pain).   Yes [provider]  insulin NPH-regular  Human (NOVOLIN 70/30) (70-30) 100 UNIT/ML injection Inject 25-35 Units into the skin 2 (two) times daily with a meal. Humulin   Yes [provider]  metFORMIN (GLUCOPHAGE) 500 MG tablet Take 1,000 mg by mouth 2 (two) times daily with a meal.  01/30/19  Yes [provider]  metoprolol succinate (TOPROL-XL) 25 MG 24 hr tablet Take 1 tablet (25 mg total) by mouth daily. Take with or immediately following a meal. Take at night. Patient taking differently: Take 25 mg by mouth daily. Take with or immediately following a meal. 06/17/19 09/30/19 Yes Miquel Dunn, NP  rivaroxaban (XARELTO) 20 MG TABS tablet Take 20 mg by mouth daily.    Yes [provider]  vitamin B-12 (CYANOCOBALAMIN) 100 MCG tablet Take 100 mcg by mouth daily.   Yes [provider]  Vitamin D, Ergocalciferol, (DRISDOL) 1.25 MG (50000 UNIT) CAPS capsule Take 50,000 Units by mouth every 7 (seven) days.   Yes [provider]  BD INSULIN SYRINGE U/F 31G X 5/16" 0.5 ML MISC 2 (two) times daily. 07/30/19   [provider]  glucose blood (ONE TOUCH ULTRA TEST) test strip Use to test blood sugar twice a day 12/12/18   [provider]  Lancets (ONETOUCH DELICA PLUS [id number]) [place] Use to test blood sugar twice daily 12/12/18   [provider]    Physical Exam: Vitals:   09/30/19 0417 09/30/19 0418 09/30/19 0507 09/30/19 0508  BP: 116/63  (!) 109/59 (!) 109/59  Pulse: (!) 55   (!) 52  Resp: 16   18  Temp: 97.8 F (36.6 C)     TempSrc: Oral     SpO2: 100%   100%  Weight:  86 kg    Height:  5\' 8"  (1.727 m)        Constitutional: NAD, calm, comfortable Vitals:   09/30/19 0417 09/30/19 0418 09/30/19 0507 09/30/19 0508  BP: 116/63  (!) 109/59 (!) 109/59  Pulse: (!) 55   (!) 52  Resp: 16   18  Temp: 97.8 F (36.6 C)     TempSrc: Oral     SpO2: 100%   100%  Weight:  86 kg    Height:  5\' 8"  (1.727 m)     Eyes: PERRL, lids and conjunctivae normal ENMT: Mucous  membranes are moist. Posterior pharynx clear of any exudate or lesions.Normal dentition.  Neck: normal, supple, no masses, no thyromegaly Respiratory: clear to auscultation bilaterally, no wheezing, no crackles. Normal respiratory effort. No accessory muscle use.  Cardiovascular: Regular rate and rhythm, no murmurs / rubs / gallops. No extremity edema. 2+ pedal pulses. No carotid bruits.  Abdomen: no tenderness, no masses palpated. No hepatosplenomegaly. Bowel sounds positive.  Musculoskeletal: no clubbing / cyanosis. No joint deformity upper and lower extremities. Good ROM, no contractures. Normal muscle tone.  Skin: no  rashes, lesions, ulcers. No induration Neurologic: CN 2-12 grossly intact. Sensation intact, DTR normal. Strength 5/5 in all 4.  Psychiatric: Normal judgment and insight. Alert and oriented x 3. Normal mood.    Labs on Admission: I have personally reviewed following labs and imaging studies  CBC: Recent Labs  Lab 09/30/19 0410  WBC 6.6  NEUTROABS 3.1  HGB 7.5*  HCT 26.3*  MCV 81.9  PLT 123456   Basic Metabolic Panel: Recent Labs  Lab 09/30/19 0410  NA 142  K 4.1  CL 112*  CO2 23  GLUCOSE 193*  BUN 16  CREATININE 1.27*  CALCIUM 8.1*   GFR: Estimated Creatinine Clearance: 54.4 mL/min (A) (by C-G formula based on SCr of 1.27 mg/dL (H)). Liver Function Tests: Recent Labs  Lab 09/30/19 0410  AST 16  ALT 12  ALKPHOS 53  BILITOT 0.6  PROT 5.4*  ALBUMIN 2.9*   No results for input(s): LIPASE, AMYLASE in the last 168 hours. No results for input(s): AMMONIA in the last 168 hours. Coagulation Profile: No results for input(s): INR, PROTIME in the last 168 hours. Cardiac Enzymes: No results for input(s): CKTOTAL, CKMB, CKMBINDEX, TROPONINI in the last 168 hours. BNP (last 3 results) No results for input(s): PROBNP in the last 8760 hours. HbA1C: No results for input(s): HGBA1C in the last 72 hours. CBG: No results for input(s): GLUCAP in the last 168  hours. Lipid Profile: No results for input(s): CHOL, HDL, LDLCALC, TRIG, CHOLHDL, LDLDIRECT in the last 72 hours. Thyroid Function Tests: No results for input(s): TSH, T4TOTAL, FREET4, T3FREE, THYROIDAB in the last 72 hours. Anemia Panel: No results for input(s): VITAMINB12, FOLATE, FERRITIN, TIBC, IRON, RETICCTPCT in the last 72 hours. Urine analysis:    Component Value Date/Time   COLORURINE AMBER (A) 05/19/2014 1814   APPEARANCEUR CLEAR 05/19/2014 1814   LABSPEC 1.029 05/19/2014 1814   PHURINE 5.0 05/19/2014 1814   GLUCOSEU NEGATIVE 05/19/2014 1814   HGBUR NEGATIVE 05/19/2014 1814   BILIRUBINUR SMALL (A) 05/19/2014 1814   BILIRUBINUR neg 10/15/2012 1358   KETONESUR 15 (A) 05/19/2014 1814   PROTEINUR 100 (A) 05/19/2014 1814   UROBILINOGEN 1.0 05/19/2014 1814   NITRITE NEGATIVE 05/19/2014 1814   LEUKOCYTESUR NEGATIVE 05/19/2014 1814   Sepsis Labs: !!!!!!!!!!!!!!!!!!!!!!!!!!!!!!!!!!!!!!!!!!!! @LABRCNTIP (procalcitonin:4,lacticidven:4) ) Recent Results (from the past 240 hour(s))  Respiratory Panel by RT PCR (Flu A&B, Covid) - Nasopharyngeal Swab     Status: None   Collection Time: 09/30/19  4:13 AM   Specimen: Nasopharyngeal Swab  Result Value Ref Range Status   SARS Coronavirus 2 by RT PCR NEGATIVE NEGATIVE Final    Comment: (NOTE) SARS-CoV-2 target nucleic acids are NOT DETECTED. The SARS-CoV-2 RNA is generally detectable in upper respiratoy specimens during the acute phase of infection. The lowest concentration of SARS-CoV-2 viral copies this assay can detect is 131 copies/mL. A negative result does not preclude SARS-Cov-2 infection and should not be used as the sole basis for treatment or other patient management decisions. A negative result may occur with  improper specimen collection/handling, submission of specimen other than nasopharyngeal swab, presence of viral mutation(s) within the areas targeted by this assay, and inadequate number of viral copies (<131  copies/mL). A negative result must be combined with clinical observations, patient history, and epidemiological information. The expected result is Negative. Fact Sheet for Patients:  PinkCheek.be Fact Sheet for Healthcare Providers:  GravelBags.it This test is not yet ap proved or cleared by the [place] and  has been authorized for  detection and/or diagnosis of SARS-CoV-2 by FDA under an Emergency Use Authorization (EUA). This EUA will remain  in effect (meaning this test can be used) for the duration of the COVID-19 declaration under Section 564(b)(1) of the Act, 21 U.S.C. section 360bbb-3(b)(1), unless the authorization is terminated or revoked sooner.    Influenza A by PCR NEGATIVE NEGATIVE Final   Influenza B by PCR NEGATIVE NEGATIVE Final    Comment: (NOTE) The Xpert Xpress SARS-CoV-2/FLU/RSV assay is intended as an aid in  the diagnosis of influenza from Nasopharyngeal swab specimens and  should not be used as a sole basis for treatment. Nasal washings and  aspirates are unacceptable for Xpert Xpress SARS-CoV-2/FLU/RSV  testing. Fact Sheet for Patients: PinkCheek.be Fact Sheet for Healthcare Providers: GravelBags.it This test is not yet approved or cleared by the [place] FDA and  has been authorized for detection and/or diagnosis of SARS-CoV-2 by  FDA under an Emergency Use Authorization (EUA). This EUA will remain  in effect (meaning this test can be used) for the duration of the  Covid-19 declaration under Section 564(b)(1) of the Act, 21  U.S.C. section 360bbb-3(b)(1), unless the authorization is  terminated or revoked. Performed at [facility], [place] [address] [place], [place] [address]      Radiological Exams on Admission: DG Chest 2 View  Result Date: 09/30/2019 CLINICAL DATA:  Syncope, GI bleed EXAM: CHEST - 2  VIEW COMPARISON:  Radiograph 10/15/2012 FINDINGS: No consolidation, features of edema, pneumothorax, or effusion. The aorta is calcified. The remaining cardiomediastinal contours are unremarkable. No acute osseous or soft tissue abnormality. Degenerative changes are present in the imaged spine and shoulders. Telemetry leads overlie the chest. IMPRESSION: No acute cardiopulmonary abnormality. Aortic Atherosclerosis (ICD10-I70.0). Electronically Signed   By: Lovena Le M.D.   On: 09/30/2019 05:02   CT Head Wo Contrast  Result Date: 09/30/2019 CLINICAL DATA:  Syncope, woke up on floor, unsure of head injury, recently restarted Xarelto EXAM: CT HEAD WITHOUT CONTRAST TECHNIQUE: Contiguous axial images were obtained from the base of the skull through the vertex without intravenous contrast. COMPARISON:  CT 05/21/2014 FINDINGS: Brain: Diffuse mild parafalcine and tentorial hyperattenuation is unchanged from comparison study 05/21/2014 and likely reflects benign dural calcification. No evidence of acute infarction, hemorrhage, hydrocephalus, extra-axial collection or mass lesion/mass effect. Patchy areas of white matter hypoattenuation are most compatible with chronic microvascular angiopathy. Symmetric prominence of the ventricles, cisterns and sulci compatible with parenchymal volume loss. Vascular: Atherosclerotic calcification of the carotid siphons. No hyperdense vessel. Skull: No significant scalp swelling or hematoma. No calvarial fracture. Sinuses/Orbits: Paranasal sinuses and mastoid air cells are predominantly clear. Orbital structures are unremarkable aside from prior lens extractions. Other: None IMPRESSION: 1. No acute intracranial abnormality. 2. Slightly hyperattenuating appearance of the falx and tentorium is unchanged from prior and is favored to reflect chronic dural mineralization. 3. Mild chronic microvascular angiopathy and parenchymal volume loss. Electronically Signed   By: Lovena Le M.D.    On: 09/30/2019 05:08   Old records reviewed Case discussed with EDP Chest x-ray no edema or infiltrate  Assessment/Plan 74 year old male with history of AVM status post clipping in the last week on Xarelto for remote history of pulmonary emboli comes in with recurrent hematochezia and worsening anemia Principal Problem:   GI (gastrointestinal bleed) hematochezia with syncopal episode-blood pressure stable in the emergency department.  However hemoglobin is dropped 2 g over the last several days.  Transfuse 2 units of blood.  GI called  will see in the morning.  Hold Xarelto and clarify whether or not he needs this after a year of treatment for PE.  Cannot see any other reason why he is on chronic anticoagulation.  Monitor blood pressure closely.  IV fluids until blood transfusion.  Further recommendations pending on GI recommendations.  Active Problems:   Anemia due to acute blood loss-due to acute blood loss GI tract.  Transfuse 2 units of blood.  Hemoglobin 7.5.  Blood pressure stable.    Syncope-due to blood loss.  Neurological exam normal.    Essential hypertension-hold blood pressure meds at this time.    Pulmonary embolism (HCC)-on Xarelto for unknown period of time.  Need to clarify whether or not this is needed beyond a year of treatment.   DVT prophylaxis: SCDs Code Status: Full Family Communication: None Disposition Plan: Days Consults called: GI Admission status: Admission   Niyah Mamaril A MD [facility]  If 7PM-7AM, please contact night-coverage [email or web address] Password [facility]  09/30/2019, 5:42 AM

## 2019-09-30 NOTE — Patient Outreach (Addendum)
  [facility]) Care Management Chronic Special Needs Program    09/30/2019  Name: Matthew Zimmerman, DOB: 1946-08-24  MRN: [id number]   [name] is enrolled in a chronic special needs plan for Diabetes. [name] was taken by EMS to [facility] emergency department in the early morning of 09/30/19 after he experienced a bloody stool with syncope. Hgb/Hct on admission was 7.5/26.3 He was previously hospitalized for the same diagnosis from 09/19/19-09/21/19 and a colonoscopy done 09/20/19 showed cecal and transverse colon angioectasia which where treated with hemostatic clips. He also received 2 units of PRBC while hospitalized. He is on Xarelto 20 mg for a previous pulmonary embolism and was instructed to resume the Xarelto on 09/23/19.  Plan: Client's individualized care plan will be sent to [facility] Utilization Management Department. RNCM will monitor progress and assist with transition of care process.  Kelli Churn RN, CCM, [place] Management Coordinator [facility] Care Management [phone]

## 2019-09-30 NOTE — Progress Notes (Signed)
PROGRESS NOTE    Matthew Zimmerman  [id number]  DOB: Aug 28, 1946  PCP: Jani Gravel, MD Admit date:09/30/2019  74 y.o. male with medical history significant of prior GI bleed, hyperlipidemia, pulmonary emboli on Xarelto for over a year who was recently admitted for GI bleed on September 21, 2019- underwent colonoscopy found to have AVM which was clipped- required 2 units of blood transfusion- resumed his Xarelto on September 23, 2019.  Patient tonight had a very large bloody bowel movement bright red blood after which he had a syncopal episode.  EMS was called and he vomited once when he tried to get up.    ED Course: Afebrile,SBP 109-130s, HR 44-70s, RR 17, O2 sat 99% RA. Hemoglobin dropped to 7.5 from 9.5 and HCT down to 26.3. BUN 16, creatinine 1.27 (0.9 on 1/16). Other labs okay. Being transfused 2 units of blood in the emergency department.  Hospital course: Patient admitted to [facility] with GI consultation for further evaluation and management of GI bleed. Subjective:  No further rectal bleeding this morning.   Objective: Vitals:   09/30/19 0531 09/30/19 0600 09/30/19 0630 09/30/19 0700  BP: 133/67 129/61 116/61 121/63  Pulse: (!) 51 (!) 55 70 (!) 44  Resp: 17 17 13 17   Temp:      TempSrc:      SpO2: 97% 100% 98% 99%  Weight:      Height:       No intake or output data in the 24 hours ending 09/30/19 0737 Filed Weights   09/30/19 0418  Weight: 86 kg    Physical Examination:  General exam: Appears calm and comfortable  Respiratory system: Clear to auscultation. Respiratory effort normal. Cardiovascular system: S1 & S2 heard, RRR. No JVD, murmurs, rubs, gallops or clicks. No pedal edema. Gastrointestinal system: Abdomen is nondistended, soft and nontender. Normal bowel sounds heard. Central nervous system: Alert and oriented. No new focal neurological deficits. Extremities: No contractures, edema or joint deformities.  Skin: No rashes, lesions or ulcers Psychiatry: Judgement and  insight appear normal. Mood & affect appropriate.   Data Reviewed: I have personally reviewed following labs and imaging studies  CBC: Recent Labs  Lab 09/30/19 0410  WBC 6.6  NEUTROABS 3.1  HGB 7.5*  HCT 26.3*  MCV 81.9  PLT 123456   Basic Metabolic Panel: Recent Labs  Lab 09/30/19 0410  NA 142  K 4.1  CL 112*  CO2 23  GLUCOSE 193*  BUN 16  CREATININE 1.27*  CALCIUM 8.1*   GFR: Estimated Creatinine Clearance: 54.4 mL/min (A) (by C-G formula based on SCr of 1.27 mg/dL (H)). Liver Function Tests: Recent Labs  Lab 09/30/19 0410  AST 16  ALT 12  ALKPHOS 53  BILITOT 0.6  PROT 5.4*  ALBUMIN 2.9*   No results for input(s): LIPASE, AMYLASE in the last 168 hours. No results for input(s): AMMONIA in the last 168 hours. Coagulation Profile: No results for input(s): INR, PROTIME in the last 168 hours. Cardiac Enzymes: No results for input(s): CKTOTAL, CKMB, CKMBINDEX, TROPONINI in the last 168 hours. BNP (last 3 results) No results for input(s): PROBNP in the last 8760 hours. HbA1C: No results for input(s): HGBA1C in the last 72 hours. CBG: No results for input(s): GLUCAP in the last 168 hours. Lipid Profile: No results for input(s): CHOL, HDL, LDLCALC, TRIG, CHOLHDL, LDLDIRECT in the last 72 hours. Thyroid Function Tests: No results for input(s): TSH, T4TOTAL, FREET4, T3FREE, THYROIDAB in the last 72 hours. Anemia Panel: No results for  input(s): VITAMINB12, FOLATE, FERRITIN, TIBC, IRON, RETICCTPCT in the last 72 hours. Sepsis Labs: No results for input(s): PROCALCITON, LATICACIDVEN in the last 168 hours.  Recent Results (from the past 240 hour(s))  Respiratory Panel by RT PCR (Flu A&B, Covid) - Nasopharyngeal Swab     Status: None   Collection Time: 09/30/19  4:13 AM   Specimen: Nasopharyngeal Swab  Result Value Ref Range Status   SARS Coronavirus 2 by RT PCR NEGATIVE NEGATIVE Final    Comment: (NOTE) SARS-CoV-2 target nucleic acids are NOT DETECTED. The  SARS-CoV-2 RNA is generally detectable in upper respiratoy specimens during the acute phase of infection. The lowest concentration of SARS-CoV-2 viral copies this assay can detect is 131 copies/mL. A negative result does not preclude SARS-Cov-2 infection and should not be used as the sole basis for treatment or other patient management decisions. A negative result may occur with  improper specimen collection/handling, submission of specimen other than nasopharyngeal swab, presence of viral mutation(s) within the areas targeted by this assay, and inadequate number of viral copies (<131 copies/mL). A negative result must be combined with clinical observations, patient history, and epidemiological information. The expected result is Negative. Fact Sheet for Patients:  PinkCheek.be Fact Sheet for Healthcare Providers:  GravelBags.it This test is not yet ap proved or cleared by the [place] FDA and  has been authorized for detection and/or diagnosis of SARS-CoV-2 by FDA under an Emergency Use Authorization (EUA). This EUA will remain  in effect (meaning this test can be used) for the duration of the COVID-19 declaration under Section 564(b)(1) of the Act, 21 U.S.C. section 360bbb-3(b)(1), unless the authorization is terminated or revoked sooner.    Influenza A by PCR NEGATIVE NEGATIVE Final   Influenza B by PCR NEGATIVE NEGATIVE Final    Comment: (NOTE) The Xpert Xpress SARS-CoV-2/FLU/RSV assay is intended as an aid in  the diagnosis of influenza from Nasopharyngeal swab specimens and  should not be used as a sole basis for treatment. Nasal washings and  aspirates are unacceptable for Xpert Xpress SARS-CoV-2/FLU/RSV  testing. Fact Sheet for Patients: PinkCheek.be Fact Sheet for Healthcare Providers: GravelBags.it This test is not yet approved or cleared by the [place] FDA and  has been authorized for detection and/or diagnosis of SARS-CoV-2 by  FDA under an Emergency Use Authorization (EUA). This EUA will remain  in effect (meaning this test can be used) for the duration of the  Covid-19 declaration under Section 564(b)(1) of the Act, 21  U.S.C. section 360bbb-3(b)(1), unless the authorization is  terminated or revoked. Performed at [facility], [place] [address], [place], [place] [address]       Radiology Studies: DG Chest 2 View  Result Date: 09/30/2019 CLINICAL DATA:  Syncope, GI bleed EXAM: CHEST - 2 VIEW COMPARISON:  Radiograph 10/15/2012 FINDINGS: No consolidation, features of edema, pneumothorax, or effusion. The aorta is calcified. The remaining cardiomediastinal contours are unremarkable. No acute osseous or soft tissue abnormality. Degenerative changes are present in the imaged spine and shoulders. Telemetry leads overlie the chest. IMPRESSION: No acute cardiopulmonary abnormality. Aortic Atherosclerosis (ICD10-I70.0). Electronically Signed   By: Lovena Le M.D.   On: 09/30/2019 05:02   CT Head Wo Contrast  Result Date: 09/30/2019 CLINICAL DATA:  Syncope, woke up on floor, unsure of head injury, recently restarted Xarelto EXAM: CT HEAD WITHOUT CONTRAST TECHNIQUE: Contiguous axial images were obtained from the base of the skull through the vertex without intravenous contrast. COMPARISON:  CT 05/21/2014  FINDINGS: Brain: Diffuse mild parafalcine and tentorial hyperattenuation is unchanged from comparison study 05/21/2014 and likely reflects benign dural calcification. No evidence of acute infarction, hemorrhage, hydrocephalus, extra-axial collection or mass lesion/mass effect. Patchy areas of white matter hypoattenuation are most compatible with chronic microvascular angiopathy. Symmetric prominence of the ventricles, cisterns and sulci compatible with parenchymal volume loss. Vascular: Atherosclerotic calcification of the  carotid siphons. No hyperdense vessel. Skull: No significant scalp swelling or hematoma. No calvarial fracture. Sinuses/Orbits: Paranasal sinuses and mastoid air cells are predominantly clear. Orbital structures are unremarkable aside from prior lens extractions. Other: None IMPRESSION: 1. No acute intracranial abnormality. 2. Slightly hyperattenuating appearance of the falx and tentorium is unchanged from prior and is favored to reflect chronic dural mineralization. 3. Mild chronic microvascular angiopathy and parenchymal volume loss. Electronically Signed   By: Lovena Le M.D.   On: 09/30/2019 05:08        Scheduled Meds:  sodium chloride   Intravenous Once   sodium chloride flush  3 mL Intravenous Q12H   Continuous Infusions:  sodium chloride      Assessment & Plan:   Lower GI bleed: -Transfuse 2 units PRBC as planned -NPO/IV fluids -Hold home BP meds for now -GI eval appreciated-started on CLD  Recurrent PE: per patient, he was diagnosed with DVT/ PE after a 6 hr drive to [place] in 2013 for which he was treated with coumadin for a year or so , then transitioned to Eliquis. Patient apparently took Eliquis for another year or so. He developed pleuritic chest pain in 2019 and diagnosed with recurrent PE and started on Xarelto. He was apparently told by his PCP that he would need life long anticoagulation for recurrent PE  Diabetes Mellitus , insulin dependent: Takes 70/30 insulin 25 units qam and 30units qhs (says adjusts dosing per BG level) and oral hypoglycemics. SSI here and resume 70/30 when diet advanced or if BG too high.    DVT prophylaxis: SCD Code Status: Full Family / Patient Communication: d/w patient Disposition Plan: home when cleared by GI     LOS: 0 days    Time spent: 25 minutes    Guilford Shi, MD [facility] Pager [phone]  If 7PM-7AM, please contact night-coverage [email or web address] Password [facility] 09/30/2019, 7:37 AM

## 2019-09-30 NOTE — Consult Note (Signed)
Referring Provider: Dr. Earnest Conroy Primary Care Physician:  Jani Gravel, MD Primary Gastroenterologist:  Althia Forts  Reason for Consultation:  GI bleed  HPI: Matthew Zimmerman is a 74 y.o. male with recent GI bleed with 2 colonic AVMs (cecum and transverse colon) that were fulgurated and hemoclipped by Dr. Therisa Doyne on 09/20/19. Discharged on 09/21/19 with Hgb 9.5 and Xarelto on hold until 09/23/19 when he was advised to resume. Reports normal brown stools until 2 AM this morning when he had the acute onset of a very large bloody bowel movement described as dark red blood and when he stood up from the toilet he passed out. Reports vomiting when EMS came but he does not know what the vomitus looked like. Denies any associated abdominal pain. Denies any further rectal bleeding after that one episode and denies any BMs or bleeding since admit. No further vomiting. Hgb 7.5 on admit and hemodynamically stable. On Aspirin 81 mg/day but otherwise denies NSAIDs. Denies alcohol.  Past Medical History:  Diagnosis Date  . Carpal tunnel syndrome on both sides   . Chest pain   . Diabetes mellitus without complication ([place])   . GERD (gastroesophageal reflux disease)   . Headache   . Hypercholesteremia   . Hyperlipidemia   . PE (pulmonary embolism)   . Shortness of breath dyspnea    PE     Past Surgical History:  Procedure Laterality Date  . BILATERAL CARPAL TUNNEL RELEASE Bilateral   . COLONOSCOPY    . COLONOSCOPY WITH PROPOFOL Left 09/20/2019   Procedure: COLONOSCOPY WITH PROPOFOL;  Surgeon: Ronnette Juniper, MD;  Location: [facility];  Service: Gastroenterology;  Laterality: Left;  . ELBOW SURGERY     BONE SPURS REMOVED B ELBOWS  . EYE SURGERY Bilateral    cataracts  . FOOT SURGERY     HEEL SPURS REMOVED B HEELS  . HEMOSTASIS CLIP PLACEMENT  09/20/2019   Procedure: HEMOSTASIS CLIP PLACEMENT;  Surgeon: Ronnette Juniper, MD;  Location: [facility];  Service: Gastroenterology;;  . HOT HEMOSTASIS N/A 09/20/2019    Procedure: HOT HEMOSTASIS (ARGON PLASMA COAGULATION/BICAP);  Surgeon: Ronnette Juniper, MD;  Location: [facility];  Service: Gastroenterology;  Laterality: N/A;  . I & D EXTREMITY Right 09/18/2014   Procedure: IRRIGATION AND DEBRIDEMENT RIGHT THUMB, open fracture repair;  Surgeon: Linna Hoff, MD;  Location: [facility];  Service: Orthopedics;  Laterality: Right;  . LEG SURGERY Left    left thigh table saw accident, "cut to the bone", I&D/suturing  . SHOULDER SURGERY Left    Bone spur  . SPINE SURGERY  09/04/1985   lumbar spine  . TOE SURGERY      Prior to Admission medications   Medication Sig Start Date End Date Taking? Authorizing Provider  aspirin EC 81 MG tablet Take 81 mg by mouth daily.   Yes [provider]  atorvastatin (LIPITOR) 20 MG tablet Take 10 mg by mouth daily.    Yes [provider]  augmented betamethasone dipropionate (DIPROLENE-AF) 0.05 % cream Apply 1 application topically daily as needed (itchy skin near bottom).  10/28/18  Yes [provider]  clotrimazole-betamethasone (LOTRISONE) cream Apply 1 application topically 2 (two) times daily.  07/11/19  Yes [provider]  dapagliflozin propanediol (FARXIGA) 5 MG TABS tablet Take 5 mg by mouth daily.   Yes Jani Gravel, MD  diclofenac sodium (VOLTAREN) 1 % GEL Apply 1 application topically 4 (four) times daily as needed (pain).   Yes [provider]  insulin NPH-regular Human (NOVOLIN  70/30) (70-30) 100 UNIT/ML injection Inject 25-35 Units into the skin 2 (two) times daily with a meal. Humulin   Yes [provider]  metFORMIN (GLUCOPHAGE) 500 MG tablet Take 1,000 mg by mouth 2 (two) times daily with a meal.  01/30/19  Yes [provider]  metoprolol succinate (TOPROL-XL) 25 MG 24 hr tablet Take 1 tablet (25 mg total) by mouth daily. Take with or immediately following a meal. Take at night. Patient taking differently: Take 25 mg by mouth daily. Take with or immediately  following a meal. 06/17/19 09/30/19 Yes Miquel Dunn, NP  rivaroxaban (XARELTO) 20 MG TABS tablet Take 20 mg by mouth daily.    Yes [provider]  vitamin B-12 (CYANOCOBALAMIN) 100 MCG tablet Take 100 mcg by mouth daily.   Yes [provider]  Vitamin D, Ergocalciferol, (DRISDOL) 1.25 MG (50000 UNIT) CAPS capsule Take 50,000 Units by mouth every 7 (seven) days.   Yes [provider]  BD INSULIN SYRINGE U/F 31G X 5/16" 0.5 ML MISC 2 (two) times daily. 07/30/19   [provider]  glucose blood (ONE TOUCH ULTRA TEST) test strip Use to test blood sugar twice a day 12/12/18   [provider]  Lancets (ONETOUCH DELICA PLUS [id number]) [place] Use to test blood sugar twice daily 12/12/18   [provider]    Scheduled Meds: . sodium chloride   Intravenous Once   Continuous Infusions: . sodium chloride     PRN Meds:.ondansetron **OR** ondansetron (ZOFRAN) IV, sodium chloride flush  Allergies as of 09/30/2019 - Review Complete 09/30/2019  Allergen Reaction Noted  . Cortisone Other (See Comments)     Family History  Problem Relation Age of Onset  . Cancer Mother   . Diabetes Father     Social History   Socioeconomic History  . Marital status: Married    Spouse name: Not on file  . Number of children: 2  . Years of education: Not on file  . Highest education level: Not on file  Occupational History  . Not on file  Tobacco Use  . Smoking status: Former Smoker    Packs/day: 0.50    Types: Cigarettes    Quit date: 1964    Years since quitting: 57.1  . Smokeless tobacco: Former Systems developer  . Tobacco comment: smoking since 74 years old  Substance and Sexual Activity  . Alcohol use: No  . Drug use: No  . Sexual activity: Not on file  Other Topics Concern  . Not on file  Social History Narrative   Lives with wife.[name]   He is retired from [employer], also disabled   [name] son age 42 years old died suddenly at client's home on  11/03/18- results of autopsy still pending on 03/20/19   Social Determinants of Health   Financial Resource Strain: Low Risk   . Difficulty of Paying Living Expenses: Not very hard  Food Insecurity: No Food Insecurity  . Worried About Charity fundraiser in the Last Year: Never true  . Ran Out of Food in the Last Year: Never true  Transportation Needs: No Transportation Needs  . Lack of Transportation (Medical): No  . Lack of Transportation (Non-Medical): No  Physical Activity: Inactive  . Days of Exercise per Week: 0 days  . Minutes of Exercise per Session: 0 min  Stress: No Stress Concern Present  . Feeling of Stress : Only a little  Social Connections: Unknown  . Frequency of Communication with Friends  and Family: More than three times a week  . Frequency of Social Gatherings with Friends and Family: Not on file  . Attends Religious Services: Not on file  . Active Member of Clubs or Organizations: Not on file  . Attends Archivist Meetings: Not on file  . Marital Status: Married  Human resources officer Violence:   . Fear of Current or Ex-Partner: Not on file  . Emotionally Abused: Not on file  . Physically Abused: Not on file  . Sexually Abused: Not on file    Review of Systems: All negative except as stated above in HPI.  Physical Exam: Vital signs: Vitals:   09/30/19 0700 09/30/19 0749  BP: 121/63 119/65  Pulse: (!) 44 (!) 56  Resp: 17 17  Temp:  98 F (36.7 C)  SpO2: 99% 98%     General:  Elderly, well-nourished, lethargic, pleasant, no acute distress  Head: normocephalic, atraumatic Eyes: anicteric sclera ENT: oropharynx clear Neck: supple, nontender Lungs:  Clear throughout to auscultation.   No wheezes, crackles, or rhonchi. No acute distress. Heart:  Regular rate and rhythm; no murmurs, clicks, rubs,  or gallops. Abdomen: soft, nontender, nondistended, +BS  Rectal:  Deferred Ext: no edema  GI:  Lab Results: Recent Labs    09/30/19 0410  WBC 6.6   HGB 7.5*  HCT 26.3*  PLT 273   BMET Recent Labs    09/30/19 0410  NA 142  K 4.1  CL 112*  CO2 23  GLUCOSE 193*  BUN 16  CREATININE 1.27*  CALCIUM 8.1*   LFT Recent Labs    09/30/19 0410  PROT 5.4*  ALBUMIN 2.9*  AST 16  ALT 12  ALKPHOS 53  BILITOT 0.6   PT/INR No results for input(s): LABPROT, INR in the last 72 hours.   Studies/Results: DG Chest 2 View  Result Date: 09/30/2019 CLINICAL DATA:  Syncope, GI bleed EXAM: CHEST - 2 VIEW COMPARISON:  Radiograph 10/15/2012 FINDINGS: No consolidation, features of edema, pneumothorax, or effusion. The aorta is calcified. The remaining cardiomediastinal contours are unremarkable. No acute osseous or soft tissue abnormality. Degenerative changes are present in the imaged spine and shoulders. Telemetry leads overlie the chest. IMPRESSION: No acute cardiopulmonary abnormality. Aortic Atherosclerosis (ICD10-I70.0). Electronically Signed   By: Lovena Le M.D.   On: 09/30/2019 05:02   CT Head Wo Contrast  Result Date: 09/30/2019 CLINICAL DATA:  Syncope, woke up on floor, unsure of head injury, recently restarted Xarelto EXAM: CT HEAD WITHOUT CONTRAST TECHNIQUE: Contiguous axial images were obtained from the base of the skull through the vertex without intravenous contrast. COMPARISON:  CT 05/21/2014 FINDINGS: Brain: Diffuse mild parafalcine and tentorial hyperattenuation is unchanged from comparison study 05/21/2014 and likely reflects benign dural calcification. No evidence of acute infarction, hemorrhage, hydrocephalus, extra-axial collection or mass lesion/mass effect. Patchy areas of white matter hypoattenuation are most compatible with chronic microvascular angiopathy. Symmetric prominence of the ventricles, cisterns and sulci compatible with parenchymal volume loss. Vascular: Atherosclerotic calcification of the carotid siphons. No hyperdense vessel. Skull: No significant scalp swelling or hematoma. No calvarial fracture.  Sinuses/Orbits: Paranasal sinuses and mastoid air cells are predominantly clear. Orbital structures are unremarkable aside from prior lens extractions. Other: None IMPRESSION: 1. No acute intracranial abnormality. 2. Slightly hyperattenuating appearance of the falx and tentorium is unchanged from prior and is favored to reflect chronic dural mineralization. 3. Mild chronic microvascular angiopathy and parenchymal volume loss. Electronically Signed   By: Lovena Le M.D.   On:  09/30/2019 05:08    Impression/Plan: GI bleed in setting of Xarelto and recent colonic AVMs that were hemoclipped X 2 each and fulgurated on 09/20/19. No further bleeding since admit. Doubt upper tract source. Suspect recently treated AVMs source of bleeding with resumed anticoagulation. If bleeding recurs, then may need a repeat colonoscopy to evaluate the hemoclipped sites but otherwise continue to hold Xarelto, agree with blood transfusion, and supportive care. Will start clear liquid diet. Will follow.    LOS: 0 days   [name]  09/30/2019, 9:08 AM  Questions please call [phone]

## 2019-09-30 NOTE — ED Provider Notes (Signed)
[facility] DEPT Provider Note   CSN: [id number] Arrival date & time: 09/30/19  0359     History Chief Complaint  Patient presents with  . GI Bleeding    Matthew Zimmerman is a 74 y.o. male.  The history is provided by the patient and the EMS personnel. The history is limited by the condition of the patient.  Rectal Bleeding Quality:  Bright red Amount:  Moderate Timing:  Constant Chronicity:  Recurrent Context: spontaneously   Context: not rectal pain   Context comment:  Reportedly did not feel well and had a syncopal event.  Was admitted last week and had cecal AVM clipped by GI. but has restarted Xarelto Similar prior episodes: yes   Relieved by:  Nothing Worsened by:  Nothing Ineffective treatments:  None tried Associated symptoms: loss of consciousness   Associated symptoms: no fever   Risk factors: anticoagulant use        Past Medical History:  Diagnosis Date  . Carpal tunnel syndrome on both sides   . Chest pain   . Diabetes mellitus without complication ([place])   . GERD (gastroesophageal reflux disease)   . Headache   . Hypercholesteremia   . Hyperlipidemia   . PE (pulmonary embolism)   . Shortness of breath dyspnea    PE     Patient Active Problem List   Diagnosis Date Noted  . Anemia due to acute blood loss 09/18/2019  . Rectal bleed 09/18/2019  . Pulmonary embolism ([place])   . Plantar fasciitis of right foot 05/21/2018  . Neck pain 05/20/2014  . Intractable back pain 05/20/2014  . Warfarin-induced coagulopathy ([place]) 05/20/2014  . Diabetes ([place]) 05/12/2010  . Essential hypertension 05/12/2010  . GERD 05/12/2010  . COLONIC POLYPS, HX OF 05/12/2010    Past Surgical History:  Procedure Laterality Date  . BILATERAL CARPAL TUNNEL RELEASE Bilateral   . COLONOSCOPY    . COLONOSCOPY WITH PROPOFOL Left 09/20/2019   Procedure: COLONOSCOPY WITH PROPOFOL;  Surgeon: Ronnette Juniper, MD;  Location: [facility];  Service:  Gastroenterology;  Laterality: Left;  . ELBOW SURGERY     BONE SPURS REMOVED B ELBOWS  . EYE SURGERY Bilateral    cataracts  . FOOT SURGERY     HEEL SPURS REMOVED B HEELS  . HEMOSTASIS CLIP PLACEMENT  09/20/2019   Procedure: HEMOSTASIS CLIP PLACEMENT;  Surgeon: Ronnette Juniper, MD;  Location: [facility];  Service: Gastroenterology;;  . HOT HEMOSTASIS N/A 09/20/2019   Procedure: HOT HEMOSTASIS (ARGON PLASMA COAGULATION/BICAP);  Surgeon: Ronnette Juniper, MD;  Location: [facility];  Service: Gastroenterology;  Laterality: N/A;  . I & D EXTREMITY Right 09/18/2014   Procedure: IRRIGATION AND DEBRIDEMENT RIGHT THUMB, open fracture repair;  Surgeon: Linna Hoff, MD;  Location: [facility];  Service: Orthopedics;  Laterality: Right;  . LEG SURGERY Left    left thigh table saw accident, "cut to the bone", I&D/suturing  . SHOULDER SURGERY Left    Bone spur  . SPINE SURGERY  09/04/1985   lumbar spine  . TOE SURGERY         Family History  Problem Relation Age of Onset  . Cancer Mother   . Diabetes Father     Social History   Tobacco Use  . Smoking status: Former Smoker    Packs/day: 0.50    Types: Cigarettes    Quit date: 1964    Years since quitting: 57.1  . Smokeless tobacco: Former Systems developer  . Tobacco comment: smoking since 74  years old  Substance Use Topics  . Alcohol use: No  . Drug use: No    Home Medications Prior to Admission medications   Medication Sig Start Date End Date Taking? Authorizing Provider  aspirin EC 81 MG tablet Take 81 mg by mouth daily.    [provider]  atorvastatin (LIPITOR) 20 MG tablet Take 10 mg by mouth daily.     [provider]  augmented betamethasone dipropionate (DIPROLENE-AF) 0.05 % cream Apply 1 application topically daily as needed (itchy skin near bottom).  10/28/18   [provider]  BD INSULIN SYRINGE U/F 31G X 5/16" 0.5 ML MISC 2 (two) times daily. 07/30/19   [provider]  clotrimazole-betamethasone (LOTRISONE)  cream APP EXT AA BID 07/11/19   [provider]  dapagliflozin propanediol (FARXIGA) 5 MG TABS tablet Take 5 mg by mouth daily.    Jani Gravel, MD  diclofenac sodium (VOLTAREN) 1 % GEL Apply 1 application topically 4 (four) times daily as needed (pain).    [provider]  glucose blood (ONE TOUCH ULTRA TEST) test strip Use to test blood sugar twice a day 12/12/18   [provider]  insulin NPH-regular Human (NOVOLIN 70/30) (70-30) 100 UNIT/ML injection Inject 25-35 Units into the skin 2 (two) times daily with a meal. Humulin    [provider]  Lancets (ONETOUCH DELICA PLUS [id number]) [place] Use to test blood sugar twice daily 12/12/18   [provider]  metFORMIN (GLUCOPHAGE) 500 MG tablet Take 2 tablets by mouth twice daily 01/30/19   [provider]  metoprolol succinate (TOPROL-XL) 25 MG 24 hr tablet Take 1 tablet (25 mg total) by mouth daily. Take with or immediately following a meal. Take at night. Patient taking differently: Take 25 mg by mouth daily. Take with or immediately following a meal. 06/17/19 09/18/19  Miquel Dunn, NP  rivaroxaban (XARELTO) 20 MG TABS tablet Take 20 mg by mouth daily.     [provider]  vitamin B-12 (CYANOCOBALAMIN) 100 MCG tablet Take 100 mcg by mouth daily.    [provider]  Vitamin D, Ergocalciferol, (DRISDOL) 1.25 MG (50000 UNIT) CAPS capsule Take 50,000 Units by mouth every 7 (seven) days.    [provider]    Allergies    Cortisone  Review of Systems   Review of Systems  Unable to perform ROS: Acuity of condition  Constitutional: Negative for fever.  Gastrointestinal: Positive for hematochezia.  Neurological: Positive for loss of consciousness.    Physical Exam Updated Vital Signs BP 116/63 (BP Location: Right Arm)   Pulse (!) 55   Temp 97.8 F (36.6 C) (Oral)   Resp 16   Ht 5\' 8"  (1.727 m)   Wt 86 kg   SpO2 100%   BMI 28.83 kg/m   Physical Exam Vitals  and nursing note reviewed.  Constitutional:      Appearance: Normal appearance. He is not diaphoretic.  HENT:     Head: Normocephalic and atraumatic.     Nose: Nose normal.  Eyes:     Conjunctiva/sclera: Conjunctivae normal.     Pupils: Pupils are equal, round, and reactive to light.  Cardiovascular:     Rate and Rhythm: Normal rate and regular rhythm.     Pulses: Normal pulses.     Heart sounds: Normal heart sounds.  Pulmonary:     Effort: Pulmonary effort is normal.     Breath sounds: Normal breath sounds.  Abdominal:  General: Abdomen is flat. Bowel sounds are normal.     Tenderness: There is no abdominal tenderness. There is no guarding or rebound.  Genitourinary:    Rectum: Guaiac result positive.  Musculoskeletal:        General: Normal range of motion.     Cervical back: Normal range of motion and neck supple.  Skin:    General: Skin is warm and dry.     Capillary Refill: Capillary refill takes less than 2 seconds.     Coloration: Skin is pale.  Neurological:     General: No focal deficit present.     Mental Status: He is alert.     Deep Tendon Reflexes: Reflexes normal.  Psychiatric:        Mood and Affect: Mood normal.     ED Results / Procedures / Treatments   Labs (all labs ordered are listed, but only abnormal results are displayed) Results for orders placed or performed during the hospital encounter of 09/30/19  CBC with Differential/Platelet  Result Value Ref Range   WBC 6.6 4.0 - 10.5 K/uL   RBC 3.21 (L) 4.22 - 5.81 MIL/uL   Hemoglobin 7.5 (L) 13.0 - 17.0 g/dL   HCT 26.3 (L) 39.0 - 52.0 %   MCV 81.9 80.0 - 100.0 fL   MCH 23.4 (L) 26.0 - 34.0 pg   MCHC 28.5 (L) 30.0 - 36.0 g/dL   RDW 16.4 (H) 11.5 - 15.5 %   Platelets 273 150 - 400 K/uL   nRBC 0.0 0.0 - 0.2 %   Neutrophils Relative % 47 %   Neutro Abs 3.1 1.7 - 7.7 K/uL   Lymphocytes Relative 37 %   Lymphs Abs 2.5 0.7 - 4.0 K/uL   Monocytes Relative 9 %   Monocytes Absolute 0.6 0.1 - 1.0 K/uL    Eosinophils Relative 5 %   Eosinophils Absolute 0.3 0.0 - 0.5 K/uL   Basophils Relative 1 %   Basophils Absolute 0.1 0.0 - 0.1 K/uL   Immature Granulocytes 1 %   Abs Immature Granulocytes 0.03 0.00 - 0.07 K/uL  POC occult blood, ED Provider will collect  Result Value Ref Range   Fecal Occult Bld POSITIVE (A) NEGATIVE   No results found.  EKG None  Radiology No results found.  Procedures Procedures (including critical care time)  Medications Ordered in ED Medications  sodium chloride 0.9 % bolus 500 mL (has no administration in time range)   MDM Reviewed: previous chart, nursing note and vitals Reviewed previous: labs Interpretation: labs and ECG Total time providing critical care: 30-74 minutes (blood transfusion). This excludes time spent performing separately reportable procedures and services. Consults: admitting MD (gastroenterology)  CRITICAL CARE Performed by: Ifeanyi Mickelson K Ryver Poblete-Rasch Total critical care time: 60 minutes Critical care time was exclusive of separately billable procedures and treating other patients. Critical care was necessary to treat or prevent imminent or life-threatening deterioration. Critical care was time spent personally by me on the following activities: development of treatment plan with patient and/or surrogate as well as nursing, discussions with consultants, evaluation of patient's response to treatment, examination of patient, obtaining history from patient or surrogate, ordering and performing treatments and interventions, ordering and review of laboratory studies, ordering and review of radiographic studies, pulse oximetry and re-evaluation of patient's condition. ED Course  I have reviewed the triage vital signs and the nursing notes.  Pertinent labs & imaging results that were available during my care of the patient were reviewed by me and  considered in my medical decision making (see chart for details).    458 case d/w Dr.  Alessandra Bevels.  NO NEED TO REVERSE XARELTO.  ADMIT TO MEDICINE.  GI WILL CONSULT   Final Clinical Impression(s) / ED Diagnoses Gi BLEED has dropped 2 grams of hemoglobin.  Will admit back to [facility].     Garrick Midgley, MD 09/30/19 [phone]

## 2019-09-30 NOTE — ED Notes (Signed)
Report given to [facility], RN, stated cannot take patient up until after 7 AM though.

## 2019-10-01 ENCOUNTER — Inpatient Hospital Stay (HOSPITAL_COMMUNITY): Payer: HMO

## 2019-10-01 DIAGNOSIS — R001 Bradycardia, unspecified: Secondary | ICD-10-CM

## 2019-10-01 DIAGNOSIS — I2699 Other pulmonary embolism without acute cor pulmonale: Secondary | ICD-10-CM

## 2019-10-01 DIAGNOSIS — R55 Syncope and collapse: Secondary | ICD-10-CM

## 2019-10-01 DIAGNOSIS — D62 Acute posthemorrhagic anemia: Secondary | ICD-10-CM

## 2019-10-01 DIAGNOSIS — I10 Essential (primary) hypertension: Secondary | ICD-10-CM

## 2019-10-01 LAB — HEMOGLOBIN AND HEMATOCRIT, BLOOD
HCT: 26.6 % — ABNORMAL LOW (ref 39.0–52.0)
HCT: 28.7 % — ABNORMAL LOW (ref 39.0–52.0)
Hemoglobin: 8.4 g/dL — ABNORMAL LOW (ref 13.0–17.0)
Hemoglobin: 8.7 g/dL — ABNORMAL LOW (ref 13.0–17.0)

## 2019-10-01 LAB — BASIC METABOLIC PANEL
Anion gap: 6 (ref 5–15)
BUN: 11 mg/dL (ref 8–23)
CO2: 24 mmol/L (ref 22–32)
Calcium: 7.9 mg/dL — ABNORMAL LOW (ref 8.9–10.3)
Chloride: 109 mmol/L (ref 98–111)
Creatinine, Ser: 1.11 mg/dL (ref 0.61–1.24)
GFR calc Af Amer: >60 mL/min (ref >60)
GFR calc non Af Amer: >60 mL/min (ref >60)
Glucose, Bld: 136 mg/dL — ABNORMAL HIGH (ref 70–99)
Potassium: 4.6 mmol/L (ref 3.5–5.1)
Sodium: 139 mmol/L (ref 135–145)

## 2019-10-01 LAB — GLUCOSE, CAPILLARY
Glucose-Capillary: 127 mg/dL — ABNORMAL HIGH (ref 70–99)
Glucose-Capillary: 147 mg/dL — ABNORMAL HIGH (ref 70–99)
Glucose-Capillary: 161 mg/dL — ABNORMAL HIGH (ref 70–99)
Glucose-Capillary: 194 mg/dL — ABNORMAL HIGH (ref 70–99)

## 2019-10-01 MED ORDER — SODIUM CHLORIDE 0.9 % IV BOLUS
500.0000 mL | Freq: Once | INTRAVENOUS | Status: AC
  Administered 2019-10-01: 500 mL via INTRAVENOUS

## 2019-10-01 MED ORDER — PANTOPRAZOLE SODIUM 40 MG IV SOLR
40.0000 mg | Freq: Once | INTRAVENOUS | Status: AC
  Administered 2019-10-01: 40 mg via INTRAVENOUS
  Filled 2019-10-01: qty 40

## 2019-10-01 MED ORDER — INSULIN ASPART 100 UNIT/ML ~~LOC~~ SOLN
0.0000 [IU] | SUBCUTANEOUS | Status: DC
  Administered 2019-10-02: 2 [IU] via SUBCUTANEOUS
  Administered 2019-10-02: 8 [IU] via SUBCUTANEOUS
  Administered 2019-10-02: 3 [IU] via SUBCUTANEOUS
  Administered 2019-10-02 (×2): 2 [IU] via SUBCUTANEOUS
  Administered 2019-10-02: 5 [IU] via SUBCUTANEOUS
  Administered 2019-10-03: 2 [IU] via SUBCUTANEOUS
  Administered 2019-10-03 (×2): 3 [IU] via SUBCUTANEOUS
  Administered 2019-10-03: 2 [IU] via SUBCUTANEOUS
  Administered 2019-10-03: 3 [IU] via SUBCUTANEOUS
  Administered 2019-10-04 (×3): 2 [IU] via SUBCUTANEOUS
  Administered 2019-10-04: 5 [IU] via SUBCUTANEOUS
  Administered 2019-10-04: 3 [IU] via SUBCUTANEOUS
  Administered 2019-10-05: 2 [IU] via SUBCUTANEOUS
  Administered 2019-10-05: 11 [IU] via SUBCUTANEOUS
  Administered 2019-10-05: 2 [IU] via SUBCUTANEOUS

## 2019-10-01 MED ORDER — AMLODIPINE BESYLATE 5 MG PO TABS
2.5000 mg | ORAL_TABLET | Freq: Every day | ORAL | Status: DC
  Administered 2019-10-01: 2.5 mg via ORAL
  Filled 2019-10-01 (×2): qty 1

## 2019-10-01 MED ORDER — METOPROLOL TARTRATE 12.5 MG HALF TABLET: 12.5000 mg | ORAL_TABLET | Freq: Two times a day (BID) | ORAL | Status: DC

## 2019-10-01 NOTE — Progress Notes (Addendum)
PROGRESS NOTE    Matthew Zimmerman  [id number]  DOB: 1946/01/13  PCP: Jani Gravel, MD Admit date:09/30/2019  74 y.o. male with medical history significant of prior GI bleed, hyperlipidemia, pulmonary emboli on Xarelto for over a year who was recently admitted for GI bleed on September 21, 2019- underwent colonoscopy found to have AVM which was clipped- required 2 units of blood transfusion- resumed his Xarelto on September 23, 2019.  Patient tonight had a very large bloody bowel movement bright red blood after which he had a syncopal episode.  EMS was called and he vomited once when he tried to get up.    ED Course: Afebrile,SBP 109-130s, HR 44-70s, RR 17, O2 sat 99% RA. Hemoglobin dropped to 7.5 from 9.5 and HCT down to 26.3. BUN 16, creatinine 1.27 (0.9 on 1/16). Other labs okay. Being transfused 2 units of blood in the emergency department.  Hospital course: Patient admitted to [facility] with GI consultation for further evaluation and management of GI bleed. Subjective:  No further rectal bleeding this morning.  Tolerated liquid diet well and advance to soft diet by GI this morning.  Complaining of right knee and ankle pain today.  Objective: Vitals:   09/30/19 1850 09/30/19 2104 10/01/19 0604 10/01/19 1336  BP: 120/65 117/66 136/68 (!) 141/66  Pulse: 73 62 64 71  Resp: 18 20 16 17   Temp: 98.6 F (37 C) 98.7 F (37.1 C) 99.3 F (37.4 C) 99.1 F (37.3 C)  TempSrc: Oral Oral Oral Oral  SpO2: 100% 98% 97% 98%  Weight:      Height:        Intake/Output Summary (Last 24 hours) at 10/01/2019 1644 Last data filed at 10/01/2019 1600 Gross per 24 hour  Intake 2340.92 ml  Output 1820 ml  Net 520.92 ml   Filed Weights   09/30/19 0418  Weight: 86 kg    Physical Examination:  General exam: Appears calm and comfortable  Respiratory system: Clear to auscultation. Respiratory effort normal. Cardiovascular system: S1 & S2 heard, RRR. No JVD, murmurs, rubs, gallops or clicks. No pedal  edema. Gastrointestinal system: Abdomen is nondistended, soft and nontender. Normal bowel sounds heard. Central nervous system: Alert and oriented. No new focal neurological deficits. Extremities: No edema but has mild right knee swelling/warmth compared to left.  Decreased range of motion along the right knee and right ankle due to pain. Skin: No rashes, lesions or ulcers Psychiatry: Judgement and insight appear normal. Mood & affect appropriate.   Data Reviewed: I have personally reviewed following labs and imaging studies  CBC: Recent Labs  Lab 09/30/19 0410 09/30/19 2048 10/01/19 0145 10/01/19 0924  WBC 6.6  --   --   --   NEUTROABS 3.1  --   --   --   HGB 7.5* 8.3* 8.4* 8.7*  HCT 26.3* 26.7* 26.6* 28.7*  MCV 81.9  --   --   --   PLT 273  --   --   --    Basic Metabolic Panel: Recent Labs  Lab 09/30/19 0410 10/01/19 0145  NA 142 139  K 4.1 4.6  CL 112* 109  CO2 23 24  GLUCOSE 193* 136*  BUN 16 11  CREATININE 1.27* 1.11  CALCIUM 8.1* 7.9*   GFR: Estimated Creatinine Clearance: 62.3 mL/min (by C-G formula based on SCr of 1.11 mg/dL). Liver Function Tests: Recent Labs  Lab 09/30/19 0410  AST 16  ALT 12  ALKPHOS 53  BILITOT 0.6  PROT 5.4*  ALBUMIN 2.9*   No results for input(s): LIPASE, AMYLASE in the last 168 hours. No results for input(s): AMMONIA in the last 168 hours. Coagulation Profile: No results for input(s): INR, PROTIME in the last 168 hours. Cardiac Enzymes: No results for input(s): CKTOTAL, CKMB, CKMBINDEX, TROPONINI in the last 168 hours. BNP (last 3 results) No results for input(s): PROBNP in the last 8760 hours. HbA1C: No results for input(s): HGBA1C in the last 72 hours. CBG: Recent Labs  Lab 09/30/19 1709 09/30/19 2106 10/01/19 0736 10/01/19 1131 10/01/19 1637  GLUCAP 129* 132* 127* 147* 161*   Lipid Profile: No results for input(s): CHOL, HDL, LDLCALC, TRIG, CHOLHDL, LDLDIRECT in the last 72 hours. Thyroid Function Tests: No  results for input(s): TSH, T4TOTAL, FREET4, T3FREE, THYROIDAB in the last 72 hours. Anemia Panel: No results for input(s): VITAMINB12, FOLATE, FERRITIN, TIBC, IRON, RETICCTPCT in the last 72 hours. Sepsis Labs: No results for input(s): PROCALCITON, LATICACIDVEN in the last 168 hours.  Recent Results (from the past 240 hour(s))  Respiratory Panel by RT PCR (Flu A&B, Covid) - Nasopharyngeal Swab     Status: None   Collection Time: 09/30/19  4:13 AM   Specimen: Nasopharyngeal Swab  Result Value Ref Range Status   SARS Coronavirus 2 by RT PCR NEGATIVE NEGATIVE Final    Comment: (NOTE) SARS-CoV-2 target nucleic acids are NOT DETECTED. The SARS-CoV-2 RNA is generally detectable in upper respiratoy specimens during the acute phase of infection. The lowest concentration of SARS-CoV-2 viral copies this assay can detect is 131 copies/mL. A negative result does not preclude SARS-Cov-2 infection and should not be used as the sole basis for treatment or other patient management decisions. A negative result may occur with  improper specimen collection/handling, submission of specimen other than nasopharyngeal swab, presence of viral mutation(s) within the areas targeted by this assay, and inadequate number of viral copies (<131 copies/mL). A negative result must be combined with clinical observations, patient history, and epidemiological information. The expected result is Negative. Fact Sheet for Patients:  PinkCheek.be Fact Sheet for Healthcare Providers:  GravelBags.it This test is not yet ap proved or cleared by the [place] FDA and  has been authorized for detection and/or diagnosis of SARS-CoV-2 by FDA under an Emergency Use Authorization (EUA). This EUA will remain  in effect (meaning this test can be used) for the duration of the COVID-19 declaration under Section 564(b)(1) of the Act, 21 U.S.C. section 360bbb-3(b)(1), unless  the authorization is terminated or revoked sooner.    Influenza A by PCR NEGATIVE NEGATIVE Final   Influenza B by PCR NEGATIVE NEGATIVE Final    Comment: (NOTE) The Xpert Xpress SARS-CoV-2/FLU/RSV assay is intended as an aid in  the diagnosis of influenza from Nasopharyngeal swab specimens and  should not be used as a sole basis for treatment. Nasal washings and  aspirates are unacceptable for Xpert Xpress SARS-CoV-2/FLU/RSV  testing. Fact Sheet for Patients: PinkCheek.be Fact Sheet for Healthcare Providers: GravelBags.it This test is not yet approved or cleared by the [place] FDA and  has been authorized for detection and/or diagnosis of SARS-CoV-2 by  FDA under an Emergency Use Authorization (EUA). This EUA will remain  in effect (meaning this test can be used) for the duration of the  Covid-19 declaration under Section 564(b)(1) of the Act, 21  U.S.C. section 360bbb-3(b)(1), unless the authorization is  terminated or revoked. Performed at [facility], [place] [address], [address], [place] [address]  Radiology Studies: DG Chest 2 View  Result Date: 09/30/2019 CLINICAL DATA:  Syncope, GI bleed EXAM: CHEST - 2 VIEW COMPARISON:  Radiograph 10/15/2012 FINDINGS: No consolidation, features of edema, pneumothorax, or effusion. The aorta is calcified. The remaining cardiomediastinal contours are unremarkable. No acute osseous or soft tissue abnormality. Degenerative changes are present in the imaged spine and shoulders. Telemetry leads overlie the chest. IMPRESSION: No acute cardiopulmonary abnormality. Aortic Atherosclerosis (ICD10-I70.0). Electronically Signed   By: Lovena Le M.D.   On: 09/30/2019 05:02   DG Knee 1-2 Views Right  Result Date: 10/01/2019 CLINICAL DATA:  Right knee and ankle pain since falling several days ago. EXAM: RIGHT KNEE - 1-2 VIEW COMPARISON:  None. FINDINGS: The mineralization  and alignment are normal. There is no evidence of acute fracture or dislocation. Mild tricompartmental degenerative changes, greatest in the medial compartment. Small to moderate knee joint effusion. There is patellar spurring and diffuse vascular calcifications. IMPRESSION: No acute osseous findings. Mild degenerative changes and small to moderate joint effusion. Electronically Signed   By: Richardean Sale M.D.   On: 10/01/2019 11:24   DG Ankle 2 Views Right  Result Date: 10/01/2019 CLINICAL DATA:  Right knee and ankle pain since falling several days ago. EXAM: RIGHT ANKLE - 2 VIEW COMPARISON:  Report from right ankle MRI 10/10/2013. FINDINGS: The bones appear adequately mineralized. No evidence of acute fracture or dislocation. There is moderate lateral soft tissue swelling. Prominent calcaneal spurs, appearing mildly fragmented at the Achilles insertion. There are prominent vascular calcifications. IMPRESSION: Lateral soft tissue swelling without evidence of acute fracture or dislocation. Prominent calcaneal spurs. Electronically Signed   By: Richardean Sale M.D.   On: 10/01/2019 11:25   CT Head Wo Contrast  Result Date: 09/30/2019 CLINICAL DATA:  Syncope, woke up on floor, unsure of head injury, recently restarted Xarelto EXAM: CT HEAD WITHOUT CONTRAST TECHNIQUE: Contiguous axial images were obtained from the base of the skull through the vertex without intravenous contrast. COMPARISON:  CT 05/21/2014 FINDINGS: Brain: Diffuse mild parafalcine and tentorial hyperattenuation is unchanged from comparison study 05/21/2014 and likely reflects benign dural calcification. No evidence of acute infarction, hemorrhage, hydrocephalus, extra-axial collection or mass lesion/mass effect. Patchy areas of white matter hypoattenuation are most compatible with chronic microvascular angiopathy. Symmetric prominence of the ventricles, cisterns and sulci compatible with parenchymal volume loss. Vascular: Atherosclerotic  calcification of the carotid siphons. No hyperdense vessel. Skull: No significant scalp swelling or hematoma. No calvarial fracture. Sinuses/Orbits: Paranasal sinuses and mastoid air cells are predominantly clear. Orbital structures are unremarkable aside from prior lens extractions. Other: None IMPRESSION: 1. No acute intracranial abnormality. 2. Slightly hyperattenuating appearance of the falx and tentorium is unchanged from prior and is favored to reflect chronic dural mineralization. 3. Mild chronic microvascular angiopathy and parenchymal volume loss. Electronically Signed   By: Lovena Le M.D.   On: 09/30/2019 05:08        Scheduled Meds: . sodium chloride   Intravenous Once  . sodium chloride   Intravenous Once  . atorvastatin  10 mg Oral Daily  . insulin aspart  0-15 Units Subcutaneous TID WC  . insulin aspart  0-5 Units Subcutaneous QHS  . vitamin B-12  100 mcg Oral Daily  . [START ON 10/06/2019] Vitamin D (Ergocalciferol)  50,000 Units Oral Q7 days   Continuous Infusions: . sodium chloride 100 mL/hr at 10/01/19 1227    Assessment & Plan:   Lower GI bleed: -S/p transfusion of 2 units PRBC.  Hemoglobin improved  from 7.5-8.5 and has remained stable.  No further rectal bleeding while here.  Appreciate GI evaluation and follow-up.  Diet advanced today.  Will likely be cleared for resuming anticoagulation in a.m.--may do better with Eliquis.?  Monitor as inpatient prior to discharge to ensure tolerance.  Syncope/fall: In the setting of GI bleed and possibly hypotension/vagal response.  Patient also noted to be bradycardic on presentation.  He is on Toprol-XL 25 mg for hypertension at home.  Feels better now.  Patient complaining of right knee pain/ankle pain since the fall (worse today than yesterday).  Does have some right knee swelling/warmth-x-rays ordered to rule out acute injuries.  Renal insufficiency with possible CKD stage I: Patient's baseline creatinine seems to have  fluctuated between 0.9-1.3 in last hospitalization and historically in 2011/2014.  Patient had elevated creatinine on this admission at 1.27.  Today improved at 1.1.  Continue to monitor with IV fluids.  Not on any ACE inhibitor's or diuretics at baseline.  Hypertension: BP meds held on admission in concern for GI bleed and syncope.  Blood pressure now improved and going up.  Patient on Toprol-XL 25 mg at home.  Given bradycardia on presentation, will hold AV blockers.  Low-dose Norvasc prescribed.   Recurrent PE: per patient, he was diagnosed with DVT/ PE after a 6 hr drive to [place] in 2013 for which he was treated with coumadin for a year or so , then transitioned to Eliquis. Patient apparently took Eliquis for another year or so. He developed pleuritic chest pain in 2019 and diagnosed with recurrent PE and started on Xarelto. He was apparently told by his PCP that he would need life long anticoagulation for recurrent PE  Diabetes Mellitus , insulin dependent: Takes 70/30 insulin 25 units qam and 30units qhs (says adjusts dosing per BG level) and oral hypoglycemics.  Blood glucose less than 170 while here with sliding scale only.  70/30 can be initiated if BG levels creep up.  Counseled patient regarding diet compliance.  Hyperlipidemia: Resume statins   DVT prophylaxis: SCD Code Status: Full Family / Patient Communication: d/w patient Disposition Plan: home when cleared by GI     LOS: 1 day    Time spent: 35 minutes    Guilford Shi, MD [facility] Pager [phone]  If 7PM-7AM, please contact night-coverage [email or web address] Password [facility] 10/01/2019, 4:44 PM

## 2019-10-01 NOTE — Progress Notes (Addendum)
Pt got very dizzy when he sat on side of bed. Pt then had 500 ml bloody BM. CBG 194. Vitals stable. On call MD paged. See new orders.

## 2019-10-01 NOTE — Progress Notes (Signed)
[facility] Gastroenterology Progress Note  Terin Barse 74 y.o. [birth year]   Subjective: No BMs or bleeding since admit. Denies abdominal pain. Reports significant flatus yesterday. Reports lower left back pain that started this morning.  Objective: Vital signs: Vitals:   09/30/19 2104 10/01/19 0604  BP: 117/66 136/68  Pulse: 62 64  Resp: 20 16  Temp: 98.7 F (37.1 C) 99.3 F (37.4 C)  SpO2: 98% 97%    Physical Exam: Gen: lethargic, elderly, no acute distress, well-nourished HEENT: anicteric sclera CV: RRR Chest: CTA B Abd: soft, nontender, nondistended, +BS Ext: no edema  Lab Results: Recent Labs    09/30/19 0410 10/01/19 0145  NA 142 139  K 4.1 4.6  CL 112* 109  CO2 23 24  GLUCOSE 193* 136*  BUN 16 11  CREATININE 1.27* 1.11  CALCIUM 8.1* 7.9*   Recent Labs    09/30/19 0410  AST 16  ALT 12  ALKPHOS 53  BILITOT 0.6  PROT 5.4*  ALBUMIN 2.9*   Recent Labs    09/30/19 0410 09/30/19 0410 09/30/19 2048 10/01/19 0145  WBC 6.6  --   --   --   NEUTROABS 3.1  --   --   --   HGB 7.5*   < > 8.3* 8.4*  HCT 26.3*   < > 26.7* 26.6*  MCV 81.9  --   --   --   PLT 273  --   --   --    < > = values in this interval not displayed.       Assessment/Plan: Rectal bleeding that has resolved. Recent clipping and fulguration of colonic AVMs during last admit. No bleeding since admit. S/P 1 U PRBCs with improvement in Hgb to 8.4 from 7.5. Advance diet. If no bleeding today, then ok to resume anticoagulation tomorrow. No plans for repeat colonoscopy at this time. Will follow.   [name] 10/01/2019, 8:47 AM  Questions please call [phone] ID: Nekhi Oen, male   DOB: Jun 22, 1946, 74 y.o.   MRN: [id number]

## 2019-10-02 ENCOUNTER — Ambulatory Visit: Payer: Self-pay | Admitting: Pharmacist

## 2019-10-02 DIAGNOSIS — K922 Gastrointestinal hemorrhage, unspecified: Secondary | ICD-10-CM

## 2019-10-02 LAB — GLUCOSE, CAPILLARY
Glucose-Capillary: 140 mg/dL — ABNORMAL HIGH (ref 70–99)
Glucose-Capillary: 145 mg/dL — ABNORMAL HIGH (ref 70–99)
Glucose-Capillary: 150 mg/dL — ABNORMAL HIGH (ref 70–99)
Glucose-Capillary: 189 mg/dL — ABNORMAL HIGH (ref 70–99)
Glucose-Capillary: 195 mg/dL — ABNORMAL HIGH (ref 70–99)
Glucose-Capillary: 207 mg/dL — ABNORMAL HIGH (ref 70–99)
Glucose-Capillary: 277 mg/dL — ABNORMAL HIGH (ref 70–99)

## 2019-10-02 LAB — BASIC METABOLIC PANEL
Anion gap: 4 — ABNORMAL LOW (ref 5–15)
BUN: 16 mg/dL (ref 8–23)
CO2: 23 mmol/L (ref 22–32)
Calcium: 7.8 mg/dL — ABNORMAL LOW (ref 8.9–10.3)
Chloride: 109 mmol/L (ref 98–111)
Creatinine, Ser: 1.16 mg/dL (ref 0.61–1.24)
GFR calc Af Amer: >60 mL/min (ref >60)
GFR calc non Af Amer: >60 mL/min (ref >60)
Glucose, Bld: 203 mg/dL — ABNORMAL HIGH (ref 70–99)
Potassium: 4.8 mmol/L (ref 3.5–5.1)
Sodium: 136 mmol/L (ref 135–145)

## 2019-10-02 LAB — CBC
HCT: 22.4 % — ABNORMAL LOW (ref 39.0–52.0)
Hemoglobin: 7 g/dL — ABNORMAL LOW (ref 13.0–17.0)
MCH: 25.3 pg — ABNORMAL LOW (ref 26.0–34.0)
MCHC: 31.3 g/dL (ref 30.0–36.0)
MCV: 80.9 fL (ref 80.0–100.0)
Platelets: 211 10*3/uL (ref 150–400)
RBC: 2.77 MIL/uL — ABNORMAL LOW (ref 4.22–5.81)
RDW: 15.9 % — ABNORMAL HIGH (ref 11.5–15.5)
WBC: 13 10*3/uL — ABNORMAL HIGH (ref 4.0–10.5)
nRBC: 0.2 % (ref 0.0–0.2)

## 2019-10-02 LAB — HEMOGLOBIN AND HEMATOCRIT, BLOOD
HCT: 20.3 % — ABNORMAL LOW (ref 39.0–52.0)
Hemoglobin: 6.2 g/dL — CL (ref 13.0–17.0)

## 2019-10-02 LAB — PREPARE RBC (CROSSMATCH)

## 2019-10-02 MED ORDER — METOPROLOL SUCCINATE ER 25 MG PO TB24
25.0000 mg | ORAL_TABLET | Freq: Every day | ORAL | Status: DC
  Administered 2019-10-02 – 2019-10-05 (×4): 25 mg via ORAL
  Filled 2019-10-02 (×4): qty 1

## 2019-10-02 MED ORDER — PEG 3350-KCL-NA BICARB-NACL 420 G PO SOLR
4000.0000 mL | Freq: Once | ORAL | Status: AC
  Administered 2019-10-02: 4000 mL via ORAL

## 2019-10-02 MED ORDER — SODIUM CHLORIDE 0.9 % IV SOLN: INTRAVENOUS | Status: DC

## 2019-10-02 MED ORDER — SODIUM CHLORIDE 0.9% IV SOLUTION: Freq: Once | INTRAVENOUS | Status: AC

## 2019-10-02 NOTE — Progress Notes (Signed)
PT Cancellation Note  Patient Details Name: Matthew Zimmerman MRN: [id number] DOB: 1946-07-03   Cancelled Treatment:     PT order received but eval deferred - pt with Hgb of 6.2 with transfusion ordered.  Will follow.  Debe Coder PT Acute Rehabilitation Services Pager [phone] Office [phone]    [facility] 10/02/2019, 10:34 AM

## 2019-10-02 NOTE — H&P (View-Only) (Signed)
[facility] Gastroenterology Progress Note  Matthew Zimmerman 74 y.o. Nov 15, 1945   Subjective: Large bloody stool with dizziness. Denies abdominal pain. Denies any further bleeding.  Objective: Vital signs: Vitals:   10/02/19 0009 10/02/19 0527  BP: (!) 133/59 (!) 120/57  Pulse: 78 78  Resp: 18 18  Temp: 98 F (36.7 C) 98.1 F (36.7 C)  SpO2: 97% 98%    Physical Exam: Gen: lethargic, no acute distress, elderly, pleasant HEENT: anicteric sclera CV: RRR Chest: CTA B Abd: soft, nontender, nondistended, +BS Ext: no edema  Lab Results: Recent Labs    10/01/19 0145 10/02/19 0506  NA 139 136  K 4.6 4.8  CL 109 109  CO2 24 23  GLUCOSE 136* 203*  BUN 11 16  CREATININE 1.11 1.16  CALCIUM 7.9* 7.8*   Recent Labs    09/30/19 0410  AST 16  ALT 12  ALKPHOS 53  BILITOT 0.6  PROT 5.4*  ALBUMIN 2.9*   Recent Labs    09/30/19 0410 09/30/19 2048 10/01/19 2256 10/02/19 0506  WBC 6.6  --  13.0*  --   NEUTROABS 3.1  --   --   --   HGB 7.5*   < > 7.0* 6.2*  HCT 26.3*   < > 22.4* 20.3*  MCV 81.9  --  80.9  --   PLT 273  --  211  --    < > = values in this interval not displayed.      Assessment/Plan: GI bleed concerning for re-bleeding of colonic AVMs that were fulgurated and hemoclipped earlier this month. Needs repeat colonoscopy due to rectal bleeding last night and drop in Hgb to 6.2 especially with plan for resuming anticoagulation prior to discharge. Colon prep today and repeat colonoscopy tomorrow. Clear liquid diet. NPO p MN. Updated wife on phone.   Matthew Zimmerman 10/02/2019, 9:09 AM  Questions please call [phone] ID: Matthew Zimmerman, male   DOB: 1945-11-28, 74 y.o.   MRN: [id number]

## 2019-10-02 NOTE — Progress Notes (Signed)
[facility] Gastroenterology Progress Note  Matthew Zimmerman 74 y.o. 09/11/1945   Subjective: Large bloody stool with dizziness. Denies abdominal pain. Denies any further bleeding.  Objective: Vital signs: Vitals:   10/02/19 0009 10/02/19 0527  BP: (!) 133/59 (!) 120/57  Pulse: 78 78  Resp: 18 18  Temp: 98 F (36.7 C) 98.1 F (36.7 C)  SpO2: 97% 98%    Physical Exam: Gen: lethargic, no acute distress, elderly, pleasant HEENT: anicteric sclera CV: RRR Chest: CTA B Abd: soft, nontender, nondistended, +BS Ext: no edema  Lab Results: Recent Labs    10/01/19 0145 10/02/19 0506  NA 139 136  K 4.6 4.8  CL 109 109  CO2 24 23  GLUCOSE 136* 203*  BUN 11 16  CREATININE 1.11 1.16  CALCIUM 7.9* 7.8*   Recent Labs    09/30/19 0410  AST 16  ALT 12  ALKPHOS 53  BILITOT 0.6  PROT 5.4*  ALBUMIN 2.9*   Recent Labs    09/30/19 0410 09/30/19 2048 10/01/19 2256 10/02/19 0506  WBC 6.6  --  13.0*  --   NEUTROABS 3.1  --   --   --   HGB 7.5*   < > 7.0* 6.2*  HCT 26.3*   < > 22.4* 20.3*  MCV 81.9  --  80.9  --   PLT 273  --  211  --    < > = values in this interval not displayed.      Assessment/Plan: GI bleed concerning for re-bleeding of colonic AVMs that were fulgurated and hemoclipped earlier this month. Needs repeat colonoscopy due to rectal bleeding last night and drop in Hgb to 6.2 especially with plan for resuming anticoagulation prior to discharge. Colon prep today and repeat colonoscopy tomorrow. Clear liquid diet. NPO p MN. Updated wife on phone.   Matthew Zimmerman 10/02/2019, 9:09 AM  Questions please call [phone] ID: Matthew Zimmerman, male   DOB: June 12, 1946, 74 y.o.   MRN: [id number]

## 2019-10-02 NOTE — Consult Note (Signed)
   [facility] Inpatient Consult   10/02/2019  Matthew Zimmerman [birth year] [id number]    HTA-CSNP (Chronic Special Needs Program):   Patient is currently active [facility] Management for chronic disease management services. Patient has been engaged by a [place] (HTA)- CSNPcoordinator (who is aware of admission) for DM management.    Patient will receive a post hospital call and will be evaluated for assessments and disease process education if transitioning to home.  Per chart review and MD brief narrative, shows that: On 09/30/19, patient presented to the ED with complaint of acute onset large BRBPR (bright red blood per rectum). When he stood up from the toilet, he passed out. His hemoglobin had further dropped to 6.2, requiring blood transfusion.  PT evaluation deferred due to patient's low Hgb of 6.2 with transfusion order.  Plan:  Will follow for progression and discharge recommendations. [facility] HTA-CSNP coordinator of disposition and transitional needs.     Of note, [facility] Care Management services does not replace or interfere with any services that are needed or arranged by inpatient case management or social work.   For questionsandadditionalinformation,please contact:  Sagar Tengan A. Suzan Manon, BSN, RN-BC [facility] Liaison Cell: [phone]

## 2019-10-02 NOTE — Progress Notes (Signed)
PROGRESS NOTE  Matthew Zimmerman  DOB: [birth year]  PCP: Jani Gravel, MD [id number]  DOA: 09/30/2019 Admitted From: Home  LOS: 2 days   Chief Complaint  Patient presents with  . GI Bleeding   Brief narrative: Matthew Zimmerman is a 74 y.o. male with PMH of HTN, HLD, DM2, PE on Xarelto, GERD and recent GI bleed with 2 colonic AVMs (cecum and transverse colon) that were fulgurated and hemoclipped by Dr. Therisa Doyne on 09/20/19. Discharged on 09/21/19 with Hgb 9.5.  As advised, patient resumed Xarelto on 09/23/2019.  On 1/26, patient presented to the ED with complaint of acute onset large BRBPR. When he stood up from the toilet he passed out. Reports vomiting when EMS came but he is not sure if he had blood in the vomitus.  In the ED, patient was hemodynamically stable. Hemoglobin was noted to have dropped from 9.5-7.5.   Patient was given 2 units PRBC transfusion. Admitted to hospitalist medicine service. GI consultation obtained.  Subjective: Patient was seen and examined this morning.  Events last night noted.  Patient had another BRBPR of around 500 ml.  Hemoglobin further dropped to 6.2 this morning.  Third unit of PRBC was transfused.  Assessment/Plan: GI bleeding, likely rebleeding from colonic AVMs -Seen by GI.  Tentative plan of colonoscopy tomorrow.  Clear liquid diet for today.  Acute on chronic anemia -Hemoglobin was noted to have dropped from 9.5 at last discharge to 7.5 on this admission.  Patient was given 2 units PRBC transfusion.  Hemoglobin further dropped to 6.2 this morning.  3rd unit of PRBC transfused.  History of pulmonary embolism -Reports to episodes of pulmonary medicine in the past, last was more than a year ago from a right leg DVT. -I think he could be a candidate for IVC filter placement.  Will discuss with IR.  Syncope -likely due to blood loss.  Neurological exam normal.  Essential hypertension -Home meds include metoprolol succinate, aspirin,  statin -Continue metoprolol and statin.  Keep aspirin on hold  Diabetes mellitus -Home meds include Farxiga, Novolin 70/30 25 units twice daily, Metformin 500 mg twice daily,  Mobility: Encourage ambulation Diet: Clear liquid diet Fluid: Normal saline at 100 mL/h DVT prophylaxis:  SCDs Code Status:  Full code Family Communication:  Not at bedside Expected Discharge:  Continue inpatient management  Consultants:  GI  Procedures:    Antimicrobials: Anti-infectives (From admission, onward)   None        Code Status: Full Code   Diet Order            Diet NPO time specified  Diet effective midnight        Diet clear liquid Room service appropriate? Yes; Fluid consistency: Thin  Diet effective now              Infusions:  . sodium chloride 100 mL/hr at 10/01/19 2240  . sodium chloride 20 mL/hr at 10/02/19 [id number]    Scheduled Meds: . sodium chloride   Intravenous Once  . sodium chloride   Intravenous Once  . amLODipine  2.5 mg Oral Daily  . atorvastatin  10 mg Oral Daily  . insulin aspart  0-15 Units Subcutaneous Q4H  . polyethylene glycol-electrolytes  4,000 mL Oral Once  . vitamin B-12  100 mcg Oral Daily  . [START ON 10/06/2019] Vitamin D (Ergocalciferol)  50,000 Units Oral Q7 days    PRN meds: morphine injection, ondansetron **OR** ondansetron (ZOFRAN) IV, sodium chloride flush, traMADol   Objective: Vitals:  10/02/19 0928 10/02/19 1007  BP: 105/60 (!) 112/57  Pulse: 72 68  Resp: 18 18  Temp: 99.4 F (37.4 C) 98.7 F (37.1 C)  SpO2: 98% 95%    Intake/Output Summary (Last 24 hours) at 10/02/2019 1235 Last data filed at 10/02/2019 1000 Gross per 24 hour  Intake 1427.59 ml  Output 2845 ml  Net -1417.41 ml   Filed Weights   09/30/19 0418  Weight: 86 kg   Weight change:  Body mass index is 28.83 kg/m.   Physical Exam: General exam: Appears calm and comfortable.  Skin: No rashes, lesions or ulcers. HEENT: Atraumatic, normocephalic, supple  neck, no obvious bleeding Lungs: Clear to auscultation bilaterally CVS: Regular rate and rhythm, no murmur GI/Abd soft, nontender, nondistended, bowel sound present CNS: Alert, awake monitor x3 Psychiatry: Mood appropriate Extremities: No pedal edema, no calf tenderness  Data Review: I have personally reviewed the laboratory data and studies available.  Recent Labs  Lab 09/30/19 0410 09/30/19 0410 09/30/19 2048 10/01/19 0145 10/01/19 0924 10/01/19 2256 10/02/19 0506  WBC 6.6  --   --   --   --  13.0*  --   NEUTROABS 3.1  --   --   --   --   --   --   HGB 7.5*   < > 8.3* 8.4* 8.7* 7.0* 6.2*  HCT 26.3*   < > 26.7* 26.6* 28.7* 22.4* 20.3*  MCV 81.9  --   --   --   --  80.9  --   PLT 273  --   --   --   --  211  --    < > = values in this interval not displayed.   Recent Labs  Lab 09/30/19 0410 10/01/19 0145 10/02/19 0506  NA 142 139 136  K 4.1 4.6 4.8  CL 112* 109 109  CO2 23 24 23   GLUCOSE 193* 136* 203*  BUN 16 11 16   CREATININE 1.27* 1.11 1.16  CALCIUM 8.1* 7.9* 7.8*    Terrilee Croak, MD  [facility] 10/02/2019

## 2019-10-02 NOTE — Anesthesia Preprocedure Evaluation (Addendum)
Anesthesia Evaluation  Patient identified by MRN, date of birth, ID band Patient awake    Reviewed: Allergy & Precautions, NPO status , Patient's Chart, lab work & pertinent test results, reviewed documented beta blocker date and time   History of Anesthesia Complications Negative for: history of anesthetic complications  Airway Mallampati: II  TM Distance: >3 FB Neck ROM: Full    Dental  (+) Dental Advisory Given   Pulmonary former smoker, PE   Pulmonary exam normal        Cardiovascular hypertension, Pt. on medications and Pt. on home beta blockers Normal cardiovascular exam     Neuro/Psych  Headaches,  Neuromuscular disease (carpal tunnel syndrome) negative psych ROS   GI/Hepatic Neg liver ROS, GERD  Controlled,  Endo/Other  diabetes, Type 2, Insulin Dependent, Oral Hypoglycemic Agents  Renal/GU negative Renal ROS     Musculoskeletal negative musculoskeletal ROS (+)   Abdominal   Peds  Hematology  (+) anemia ,   Anesthesia Other Findings Covid neg 1/26  Reproductive/Obstetrics                            Anesthesia Physical Anesthesia Plan  ASA: III  Anesthesia Plan: MAC   Post-op Pain Management:    Induction: Intravenous  PONV Risk Score and Plan: 1 and Propofol infusion and Treatment may vary due to age or medical condition  Airway Management Planned: Natural Airway and Simple Face Mask  Additional Equipment: None  Intra-op Plan:   Post-operative Plan:   Informed Consent: I have reviewed the patients History and Physical, chart, labs and discussed the procedure including the risks, benefits and alternatives for the proposed anesthesia with the patient or authorized representative who has indicated his/her understanding and acceptance.       Plan Discussed with: CRNA and Anesthesiologist  Anesthesia Plan Comments:        Anesthesia Quick Evaluation

## 2019-10-03 ENCOUNTER — Inpatient Hospital Stay (HOSPITAL_COMMUNITY): Payer: HMO | Admitting: Anesthesiology

## 2019-10-03 ENCOUNTER — Encounter (HOSPITAL_COMMUNITY): Payer: Self-pay | Admitting: Family Medicine

## 2019-10-03 ENCOUNTER — Inpatient Hospital Stay (HOSPITAL_COMMUNITY): Payer: HMO

## 2019-10-03 ENCOUNTER — Encounter (HOSPITAL_COMMUNITY)
Admission: EM | Disposition: A | Discharge status: Home Health | Payer: Self-pay | Source: Home / Self Care | Attending: Internal Medicine

## 2019-10-03 HISTORY — PX: SUBMUCOSAL INJECTION: SHX5543

## 2019-10-03 HISTORY — PX: COLONOSCOPY WITH PROPOFOL: SHX5780

## 2019-10-03 HISTORY — PX: IR IVC FILTER PLMT / S&I /IMG GUID/MOD SED: IMG701

## 2019-10-03 HISTORY — PX: HEMOSTASIS CLIP PLACEMENT: SHX6857

## 2019-10-03 LAB — CBC WITH DIFFERENTIAL/PLATELET
Abs Immature Granulocytes: 0.04 10*3/uL (ref 0.00–0.07)
Basophils Absolute: 0.1 10*3/uL (ref 0.0–0.1)
Basophils Relative: 1 %
Eosinophils Absolute: 0.5 10*3/uL (ref 0.0–0.5)
Eosinophils Relative: 5 %
HCT: 21 % — ABNORMAL LOW (ref 39.0–52.0)
Hemoglobin: 6.7 g/dL — CL (ref 13.0–17.0)
Immature Granulocytes: 0 %
Lymphocytes Relative: 22 %
Lymphs Abs: 2.1 10*3/uL (ref 0.7–4.0)
MCH: 26.6 pg (ref 26.0–34.0)
MCHC: 31.9 g/dL (ref 30.0–36.0)
MCV: 83.3 fL (ref 80.0–100.0)
Monocytes Absolute: 0.9 10*3/uL (ref 0.1–1.0)
Monocytes Relative: 10 %
Neutro Abs: 6 10*3/uL (ref 1.7–7.7)
Neutrophils Relative %: 62 %
Platelets: 203 10*3/uL (ref 150–400)
RBC: 2.52 MIL/uL — ABNORMAL LOW (ref 4.22–5.81)
RDW: 16.4 % — ABNORMAL HIGH (ref 11.5–15.5)
WBC: 9.6 10*3/uL (ref 4.0–10.5)
nRBC: 0.2 % (ref 0.0–0.2)

## 2019-10-03 LAB — PROTIME-INR
INR: 1.1 (ref 0.8–1.2)
Prothrombin Time: 14.4 seconds (ref 11.4–15.2)

## 2019-10-03 LAB — GLUCOSE, CAPILLARY
Glucose-Capillary: 180 mg/dL — ABNORMAL HIGH (ref 70–99)
Glucose-Capillary: 138 mg/dL — ABNORMAL HIGH (ref 70–99)
Glucose-Capillary: 164 mg/dL — ABNORMAL HIGH (ref 70–99)
Glucose-Capillary: 160 mg/dL — ABNORMAL HIGH (ref 70–99)
Glucose-Capillary: 149 mg/dL — ABNORMAL HIGH (ref 70–99)
Glucose-Capillary: 146 mg/dL — ABNORMAL HIGH (ref 70–99)

## 2019-10-03 LAB — BASIC METABOLIC PANEL
Anion gap: 9 (ref 5–15)
BUN: 13 mg/dL (ref 8–23)
CO2: 23 mmol/L (ref 22–32)
Calcium: 7.8 mg/dL — ABNORMAL LOW (ref 8.9–10.3)
Chloride: 104 mmol/L (ref 98–111)
Creatinine, Ser: 1 mg/dL (ref 0.61–1.24)
GFR calc Af Amer: >60 mL/min (ref >60)
GFR calc non Af Amer: >60 mL/min (ref >60)
Glucose, Bld: 171 mg/dL — ABNORMAL HIGH (ref 70–99)
Potassium: 3.9 mmol/L (ref 3.5–5.1)
Sodium: 136 mmol/L (ref 135–145)

## 2019-10-03 LAB — PREPARE RBC (CROSSMATCH)

## 2019-10-03 LAB — HEMOGLOBIN AND HEMATOCRIT, BLOOD
HCT: 24.3 % — ABNORMAL LOW (ref 39.0–52.0)
Hemoglobin: 7.5 g/dL — ABNORMAL LOW (ref 13.0–17.0)

## 2019-10-03 SURGERY — COLONOSCOPY WITH PROPOFOL
Anesthesia: Monitor Anesthesia Care

## 2019-10-03 MED ORDER — EPHEDRINE SULFATE-NACL 50-0.9 MG/10ML-% IV SOSY
PREFILLED_SYRINGE | INTRAVENOUS | Status: DC | PRN
  Administered 2019-10-03: 15 mg via INTRAVENOUS

## 2019-10-03 MED ORDER — SODIUM CHLORIDE 0.9% IV SOLUTION: Freq: Once | INTRAVENOUS | Status: AC

## 2019-10-03 MED ORDER — PROPOFOL 10 MG/ML IV BOLUS
INTRAVENOUS | Status: DC | PRN
  Administered 2019-10-03: 40 ug via INTRAVENOUS

## 2019-10-03 MED ORDER — MIDAZOLAM HCL 2 MG/2ML IJ SOLN
INTRAMUSCULAR | Status: AC
  Filled 2019-10-03: qty 2

## 2019-10-03 MED ORDER — MIDAZOLAM HCL 2 MG/2ML IJ SOLN
INTRAMUSCULAR | Status: AC | PRN
  Administered 2019-10-03 (×2): 1 mg via INTRAVENOUS

## 2019-10-03 MED ORDER — IOHEXOL 300 MG/ML  SOLN
100.0000 mL | Freq: Once | INTRAMUSCULAR | Status: AC | PRN
  Administered 2019-10-03: 40 mL via INTRAVENOUS

## 2019-10-03 MED ORDER — EPINEPHRINE 1 MG/10ML IJ SOSY
PREFILLED_SYRINGE | INTRAMUSCULAR | Status: AC
  Filled 2019-10-03: qty 10

## 2019-10-03 MED ORDER — FENTANYL CITRATE (PF) 100 MCG/2ML IJ SOLN
INTRAMUSCULAR | Status: AC
  Filled 2019-10-03: qty 2

## 2019-10-03 MED ORDER — LIDOCAINE 2% (20 MG/ML) 5 ML SYRINGE
INTRAMUSCULAR | Status: DC | PRN
  Administered 2019-10-03: 50 mg via INTRAVENOUS

## 2019-10-03 MED ORDER — PROPOFOL 500 MG/50ML IV EMUL
INTRAVENOUS | Status: AC
  Filled 2019-10-03: qty 50

## 2019-10-03 MED ORDER — SODIUM CHLORIDE (PF) 0.9 % IJ SOLN
PREFILLED_SYRINGE | INTRAMUSCULAR | Status: DC | PRN
  Administered 2019-10-03: 5 mL

## 2019-10-03 MED ORDER — LIDOCAINE HCL 1 % IJ SOLN
INTRAMUSCULAR | Status: AC
  Filled 2019-10-03: qty 20

## 2019-10-03 MED ORDER — LIDOCAINE HCL (PF) 1 % IJ SOLN
INTRAMUSCULAR | Status: AC | PRN
  Administered 2019-10-03: 5 mL

## 2019-10-03 MED ORDER — IOHEXOL 300 MG/ML  SOLN
50.0000 mL | Freq: Once | INTRAMUSCULAR | Status: AC | PRN
  Administered 2019-10-03: 25 mL via INTRAVENOUS

## 2019-10-03 MED ORDER — FENTANYL CITRATE (PF) 100 MCG/2ML IJ SOLN
INTRAMUSCULAR | Status: AC | PRN
  Administered 2019-10-03: 50 ug via INTRAVENOUS

## 2019-10-03 MED ORDER — PROPOFOL 500 MG/50ML IV EMUL
INTRAVENOUS | Status: DC | PRN
  Administered 2019-10-03: 150 ug/kg/min via INTRAVENOUS

## 2019-10-03 MED ORDER — PHENYLEPHRINE 40 MCG/ML (10ML) SYRINGE FOR IV PUSH (FOR BLOOD PRESSURE SUPPORT)
PREFILLED_SYRINGE | INTRAVENOUS | Status: DC | PRN
  Administered 2019-10-03: 120 ug via INTRAVENOUS

## 2019-10-03 MED ORDER — LACTATED RINGERS IV SOLN: INTRAVENOUS | Status: DC

## 2019-10-03 SURGICAL SUPPLY — 22 items

## 2019-10-03 NOTE — Transfer of Care (Signed)
Immediate Anesthesia Transfer of Care Note  Patient: Matthew Zimmerman  Procedure(s) Performed: COLONOSCOPY WITH PROPOFOL (N/A ) HEMOSTASIS CLIP PLACEMENT SUBMUCOSAL INJECTION  Patient Location: endoscopy  Anesthesia Type:MAC  Level of Consciousness: drowsy  Airway & Oxygen Therapy: Patient Spontanous Breathing and Patient connected to face mask oxygen  Post-op Assessment: Report given to RN and Post -op Vital signs reviewed and stable  Post vital signs: Reviewed and stable  Last Vitals:  Vitals Value Taken Time  BP 123/57 10/03/19 0823  Temp    Pulse 79 10/03/19 0824  Resp 26 10/03/19 0824  SpO2 100 % 10/03/19 0824  Vitals shown include unvalidated device data.  Last Pain:  Vitals:   10/03/19 0657  TempSrc: Oral  PainSc: 0-No pain      Patients Stated Pain Goal: 2 (60/45/40 9811)  Complications: No apparent anesthesia complications

## 2019-10-03 NOTE — Consult Note (Signed)
Chief Complaint: Patient was seen in consultation today for inferior vena cava filter placement Chief Complaint  Patient presents with  . GI Bleeding    Referring Physician(s): Dahal,B  Supervising Physician: Daryll Brod  Patient Status: [facility] - In-pt  History of Present Illness: Matthew Zimmerman is a 74 y.o. male with past medical history of carpal tunnel syndrome, diabetes, GERD, hypercholesterolemia, reported history per patient of right lower extremity DVT in [place] in 2013 with associated PE, small right lower lobe PE diagnosed again in June 2020 at [facility] as outpatient) as well as recent GI bleed.  Patient is status post argon plasma coagulation and clipping of colonic angiectasia on 09/20/2019.  He was admitted to [facility] on 09/30/19 with recurrent bloody bowel movements as well as syncopal spell.  He was transfused.  Colonoscopy performed this morning revealed solitary ulcer in the cecum with no bleeding as well as ulcerated mucosa of the transverse colon which was treated with epinephrine and clipping.  Request now received from primary care team for IVC filter placement.  Past Medical History:  Diagnosis Date  . Carpal tunnel syndrome on both sides   . Chest pain   . Diabetes mellitus without complication ([place])   . GERD (gastroesophageal reflux disease)   . Headache   . Hypercholesteremia   . Hyperlipidemia   . PE (pulmonary embolism)   . Shortness of breath dyspnea    PE     Past Surgical History:  Procedure Laterality Date  . BILATERAL CARPAL TUNNEL RELEASE Bilateral   . COLONOSCOPY    . COLONOSCOPY WITH PROPOFOL Left 09/20/2019   Procedure: COLONOSCOPY WITH PROPOFOL;  Surgeon: Ronnette Juniper, MD;  Location: [facility];  Service: Gastroenterology;  Laterality: Left;  . ELBOW SURGERY     BONE SPURS REMOVED B ELBOWS  . EYE SURGERY Bilateral    cataracts  . FOOT SURGERY     HEEL SPURS REMOVED B HEELS  . HEMOSTASIS CLIP PLACEMENT   09/20/2019   Procedure: HEMOSTASIS CLIP PLACEMENT;  Surgeon: Ronnette Juniper, MD;  Location: [facility];  Service: Gastroenterology;;  . HOT HEMOSTASIS N/A 09/20/2019   Procedure: HOT HEMOSTASIS (ARGON PLASMA COAGULATION/BICAP);  Surgeon: Ronnette Juniper, MD;  Location: [facility];  Service: Gastroenterology;  Laterality: N/A;  . I & D EXTREMITY Right 09/18/2014   Procedure: IRRIGATION AND DEBRIDEMENT RIGHT THUMB, open fracture repair;  Surgeon: Linna Hoff, MD;  Location: [place];  Service: Orthopedics;  Laterality: Right;  . LEG SURGERY Left    left thigh table saw accident, "cut to the bone", I&D/suturing  . SHOULDER SURGERY Left    Bone spur  . SPINE SURGERY  09/04/1985   lumbar spine  . TOE SURGERY      Allergies: Cortisone  Medications: Prior to Admission medications   Medication Sig Start Date End Date Taking? Authorizing Provider  aspirin EC 81 MG tablet Take 81 mg by mouth daily.   Yes [provider]  atorvastatin (LIPITOR) 20 MG tablet Take 10 mg by mouth daily.    Yes [provider]  augmented betamethasone dipropionate (DIPROLENE-AF) 0.05 % cream Apply 1 application topically daily as needed (itchy skin near bottom).  10/28/18  Yes [provider]  clotrimazole-betamethasone (LOTRISONE) cream Apply 1 application topically 2 (two) times daily.  07/11/19  Yes [provider]  dapagliflozin propanediol (FARXIGA) 5 MG TABS tablet Take 5 mg by mouth daily.   Yes Jani Gravel, MD  diclofenac sodium (VOLTAREN) 1 % GEL  Apply 1 application topically 4 (four) times daily as needed (pain).   Yes [provider]  insulin NPH-regular Human (NOVOLIN 70/30) (70-30) 100 UNIT/ML injection Inject 25-35 Units into the skin 2 (two) times daily with a meal. Humulin   Yes [provider]  metFORMIN (GLUCOPHAGE) 500 MG tablet Take 1,000 mg by mouth 2 (two) times daily with a meal.  01/30/19  Yes [provider]  metoprolol succinate (TOPROL-XL)  25 MG 24 hr tablet Take 1 tablet (25 mg total) by mouth daily. Take with or immediately following a meal. Take at night. Patient taking differently: Take 25 mg by mouth daily. Take with or immediately following a meal. 06/17/19 09/30/19 Yes Miquel Dunn, NP  rivaroxaban (XARELTO) 20 MG TABS tablet Take 20 mg by mouth daily.    Yes [provider]  vitamin B-12 (CYANOCOBALAMIN) 100 MCG tablet Take 100 mcg by mouth daily.   Yes [provider]  Vitamin D, Ergocalciferol, (DRISDOL) 1.25 MG (50000 UNIT) CAPS capsule Take 50,000 Units by mouth every 7 (seven) days.   Yes [provider]  BD INSULIN SYRINGE U/F 31G X 5/16" 0.5 ML MISC 2 (two) times daily. 07/30/19   [provider]  glucose blood (ONE TOUCH ULTRA TEST) test strip Use to test blood sugar twice a day 12/12/18   [provider]  Lancets [facility] DELICA PLUS [id number]) MISC Use to test blood sugar twice daily 12/12/18   [provider]     Family History  Problem Relation Age of Onset  . Cancer Mother   . Diabetes Father     Social History   Socioeconomic History  . Marital status: Married    Spouse name: Not on file  . Number of children: 2  . Years of education: Not on file  . Highest education level: Not on file  Occupational History  . Not on file  Tobacco Use  . Smoking status: Former Smoker    Packs/day: 0.50    Types: Cigarettes    Quit date: 1964    Years since quitting: 57.1  . Smokeless tobacco: Former Systems developer  . Tobacco comment: smoking since 74 years old  Substance and Sexual Activity  . Alcohol use: No  . Drug use: No  . Sexual activity: Not on file  Other Topics Concern  . Not on file  Social History Narrative   Lives with wife.Matthew Zimmerman   He is retired from [employer], also disabled   Matthew Zimmerman son age 4 years old died suddenly at client's home on 11/03/18- results of autopsy still pending on 03/20/19   Social Determinants of Health   Financial  Resource Strain: Low Risk   . Difficulty of Paying Living Expenses: Not very hard  Food Insecurity: No Food Insecurity  . Worried About Charity fundraiser in the Last Year: Never true  . Ran Out of Food in the Last Year: Never true  Transportation Needs: No Transportation Needs  . Lack of Transportation (Medical): No  . Lack of Transportation (Non-Medical): No  Physical Activity: Inactive  . Days of Exercise per Week: 0 days  . Minutes of Exercise per Session: 0 min  Stress: No Stress Concern Present  . Feeling of Stress : Only a little  Social Connections: Unknown  . Frequency of Communication with Friends and Family: More than three times a week  . Frequency of Social Gatherings with Friends and Family: Not on file  . Attends Religious Services: Not on file  .  Active Member of Clubs or Organizations: Not on file  . Attends Archivist Meetings: Not on file  . Marital Status: Married      Review of Systems currently denies fever, headache, chest pain, dyspnea, cough, abdominal/back pain, nausea, vomiting or bleeding at the moment.  Does have some right lower extremity soreness  Vital Signs: BP 132/63 (BP Location: Left Arm)   Pulse 77   Temp 98.6 F (37 C) (Oral)   Resp 16   Ht 5\' 8"  (1.727 m)   Wt 189 lb 9.5 oz (86 kg)   SpO2 99%   BMI 28.83 kg/m   Physical Exam awake, alert.  Chest clear to auscultation bilaterally.  Heart with regular rate and rhythm.  Abdomen soft, positive bowel sounds, nontender.  No significant lower extremity edema.  Imaging: DG Chest 2 View  Result Date: 09/30/2019 CLINICAL DATA:  Syncope, GI bleed EXAM: CHEST - 2 VIEW COMPARISON:  Radiograph 10/15/2012 FINDINGS: No consolidation, features of edema, pneumothorax, or effusion. The aorta is calcified. The remaining cardiomediastinal contours are unremarkable. No acute osseous or soft tissue abnormality. Degenerative changes are present in the imaged spine and shoulders. Telemetry leads  overlie the chest. IMPRESSION: No acute cardiopulmonary abnormality. Aortic Atherosclerosis (ICD10-I70.0). Electronically Signed   By: Lovena Le M.D.   On: 09/30/2019 05:02   DG Knee 1-2 Views Right  Result Date: 10/01/2019 CLINICAL DATA:  Right knee and ankle pain since falling several days ago. EXAM: RIGHT KNEE - 1-2 VIEW COMPARISON:  None. FINDINGS: The mineralization and alignment are normal. There is no evidence of acute fracture or dislocation. Mild tricompartmental degenerative changes, greatest in the medial compartment. Small to moderate knee joint effusion. There is patellar spurring and diffuse vascular calcifications. IMPRESSION: No acute osseous findings. Mild degenerative changes and small to moderate joint effusion. Electronically Signed   By: Richardean Sale M.D.   On: 10/01/2019 11:24   DG Ankle 2 Views Right  Result Date: 10/01/2019 CLINICAL DATA:  Right knee and ankle pain since falling several days ago. EXAM: RIGHT ANKLE - 2 VIEW COMPARISON:  Report from right ankle MRI 10/10/2013. FINDINGS: The bones appear adequately mineralized. No evidence of acute fracture or dislocation. There is moderate lateral soft tissue swelling. Prominent calcaneal spurs, appearing mildly fragmented at the Achilles insertion. There are prominent vascular calcifications. IMPRESSION: Lateral soft tissue swelling without evidence of acute fracture or dislocation. Prominent calcaneal spurs. Electronically Signed   By: Richardean Sale M.D.   On: 10/01/2019 11:25   CT Head Wo Contrast  Result Date: 09/30/2019 CLINICAL DATA:  Syncope, woke up on floor, unsure of head injury, recently restarted Xarelto EXAM: CT HEAD WITHOUT CONTRAST TECHNIQUE: Contiguous axial images were obtained from the base of the skull through the vertex without intravenous contrast. COMPARISON:  CT 05/21/2014 FINDINGS: Brain: Diffuse mild parafalcine and tentorial hyperattenuation is unchanged from comparison study 05/21/2014 and likely  reflects benign dural calcification. No evidence of acute infarction, hemorrhage, hydrocephalus, extra-axial collection or mass lesion/mass effect. Patchy areas of white matter hypoattenuation are most compatible with chronic microvascular angiopathy. Symmetric prominence of the ventricles, cisterns and sulci compatible with parenchymal volume loss. Vascular: Atherosclerotic calcification of the carotid siphons. No hyperdense vessel. Skull: No significant scalp swelling or hematoma. No calvarial fracture. Sinuses/Orbits: Paranasal sinuses and mastoid air cells are predominantly clear. Orbital structures are unremarkable aside from prior lens extractions. Other: None IMPRESSION: 1. No acute intracranial abnormality. 2. Slightly hyperattenuating appearance of the falx and tentorium is unchanged  from prior and is favored to reflect chronic dural mineralization. 3. Mild chronic microvascular angiopathy and parenchymal volume loss. Electronically Signed   By: Lovena Le M.D.   On: 09/30/2019 05:08    Labs:  CBC: Recent Labs    09/21/19 0453 09/21/19 0453 09/30/19 0410 09/30/19 2048 10/01/19 0924 10/01/19 2256 10/02/19 0506 10/03/19 0903  WBC 8.4  --  6.6  --   --  13.0*  --  9.6  HGB 9.5*   < > 7.5*   < > 8.7* 7.0* 6.2* 6.7*  HCT 32.9*   < > 26.3*   < > 28.7* 22.4* 20.3* 21.0*  PLT 322  --  273  --   --  211  --  203   < > = values in this interval not displayed.    COAGS: No results for input(s): INR, APTT in the last 8760 hours.  BMP: Recent Labs    09/30/19 0410 10/01/19 0145 10/02/19 0506 10/03/19 0903  NA 142 139 136 136  K 4.1 4.6 4.8 3.9  CL 112* 109 109 104  CO2 23 24 23 23   GLUCOSE 193* 136* 203* 171*  BUN 16 11 16 13   CALCIUM 8.1* 7.9* 7.8* 7.8*  CREATININE 1.27* 1.11 1.16 1.00  GFRNONAA 55* >60 >60 >60  GFRAA >60 >60 >60 >60    LIVER FUNCTION TESTS: Recent Labs    09/18/19 2220 09/19/19 0056 09/20/19 0520 09/30/19 0410  BILITOT 0.2* 0.5 1.0 0.6  AST 17 15  13* 16  ALT 16 14 14 12   ALKPHOS 74 66 63 53  PROT 7.4 6.6 6.4* 5.4*  ALBUMIN 4.0 3.5 3.4* 2.9*    TUMOR MARKERS: No results for input(s): AFPTM, CEA, CA199, CHROMGRNA in the last 8760 hours.  Assessment and Plan: 74 y.o. male with past medical history of carpal tunnel syndrome, diabetes, GERD, hypercholesterolemia, reported history per patient of right lower extremity DVT in [place] in 2013 with associated PE, small right lower lobe PE diagnosed again in June 2020 at [facility] as outpatient) as well as recent GI bleed.  Patient is status post argon plasma coagulation and clipping of colonic angiectasia on 09/20/2019.  He was admitted to [facility] on 09/30/19 with recurrent bloody bowel movements as well as syncopal spell.  He was transfused.  Colonoscopy performed this morning revealed solitary ulcer in the cecum with no bleeding as well as ulcerated mucosa of the transverse colon which was treated with epinephrine and clipping.  Request now received from primary care team for IVC filter placement.  Case has been reviewed by Dr. Annamaria Boots.Risks and benefits discussed with the patient including, but not limited to bleeding, infection, contrast induced renal failure, filter fracture or migration which can lead to emergency surgery or even death, strut penetration with damage or irritation to adjacent structures and caval thrombosis.  All of the patient's questions were answered, patient is agreeable to proceed. Consent signed and in chart.  Procedure tentatively planned for today.  We will also try to reach patient's spouse to update her on plans as well.   Thank you for this interesting consult.  I greatly enjoyed meeting Hillis Denzer and look forward to participating in their care.  A copy of this report was sent to the requesting provider on this date.  Electronically Signed: D. Rowe Robert, PA-C 10/03/2019, 10:13 AM   I spent a total of 30 minutes   in face to  face in clinical consultation, greater than 50%  of which was counseling/coordinating care for inferior vena cava filter placement

## 2019-10-03 NOTE — Procedures (Signed)
Previous PE / DVT, acute GI bleeding,   S/p IVC filter  No comp Stable ebl min Full report in pacs

## 2019-10-03 NOTE — Brief Op Note (Signed)
No active bleeding seen and no blood products noted on the colonoscopy. Cecal hemoclips intact on ulcer that developed following fulguration of the AVM during the previous colonoscopy. Transverse colon site ulcer noted without any intact hemoclips (hemoclips came off this site). 5 cc of epinephrine:saline mixture injected and 2 hemoclips successfully placed at this site. See endopro note for details. Clear liquid diet today and advance as tolerated. Hold off on resuming anticoagulation until tomorrow if stable. CBC pending. Discussed with patient and wife by phone. Dr. Paulita Fujita will f/u tomorrow.

## 2019-10-03 NOTE — Progress Notes (Signed)
Blood consent completed yesterday. Located in chart.

## 2019-10-03 NOTE — Op Note (Signed)
[facility] Patient Name: Matthew Zimmerman Procedure Date: 10/03/2019 MRN: [id number] Attending MD: [name] , MD Date of Birth: 11-Aug-1946 CSN: [id number] Age: 74 Admit Type: Inpatient Procedure:                Colonoscopy Indications:              Last colonoscopy: January 2021, Hematochezia Providers:                [name], MD, Cleda Daub, RN, Lina Sar, Technician, Stephanie [place] CRNA Referring MD:             hospital team Medicines:                Propofol per Anesthesia, Monitored Anesthesia Care Complications:            No immediate complications. Estimated Blood Loss:     Estimated blood loss: none. Procedure:                Pre-Anesthesia Assessment:                           - Prior to the procedure, a History and Physical                            was performed, and patient medications and                            allergies were reviewed. The patient's tolerance of                            previous anesthesia was also reviewed. The risks                            and benefits of the procedure and the sedation                            options and risks were discussed with the patient.                            All questions were answered, and informed consent                            was obtained. Prior Anticoagulants: The patient has                            taken no previous anticoagulant or antiplatelet                            agents. ASA Grade Assessment: III - A patient with                            severe systemic disease. After reviewing the risks  and benefits, the patient was deemed in                            satisfactory condition to undergo the procedure.                           After obtaining informed consent, the colonoscope                            was passed under direct vision. Throughout the                            procedure,  the patient's blood pressure, pulse, and                            oxygen saturations were monitored continuously. The                            [id number] Olympus pediatric colonscope                            was introduced through the anus and advanced to the                            the cecum, identified by appendiceal orifice and                            ileocecal valve. The colonoscopy was performed                            without difficulty. The patient tolerated the                            procedure well. The quality of the bowel                            preparation was adequate and good. The terminal                            ileum, the appendiceal orifice and the rectum were                            photographed. Scope In: 7:53:41 AM Scope Out: 8:15:54 AM Scope Withdrawal Time: 0 hours 18 minutes 10 seconds  Total Procedure Duration: 0 hours 22 minutes 13 seconds  Findings:      The perianal and digital rectal examinations were normal.      A single (solitary) ulcer was found in the cecum. No bleeding was       present.      Area of previous hemoclipping intact with nonbleeding ulcer noted. No       cecal lesions noted.      A localized area of ulcerated mucosa was found in the transverse colon.       Area was successfully injected with 5 mL of a 1:10,000 solution of  epinephrine for hemostasis. For hemostasis, two hemostatic clips were       successfully placed. There was no bleeding during, or at the end, of the       procedure.      Internal hemorrhoids were found during retroflexion. The hemorrhoids       were small and Grade I (internal hemorrhoids that do not prolapse). Impression:               - A single (solitary) ulcer in the cecum.                           - Ulcerated mucosa in the transverse colon.                            Injected. Clips were placed.                           - Internal hemorrhoids.                           -  No specimens collected. Moderate Sedation:      Not Applicable - Patient had care per Anesthesia. Recommendation:           - Clear liquid diet.                           - Repeat colonoscopy in 5 years for screening                            purposes.                           - Continue present medications. Procedure Code(s):        --- Professional ---                           [phone], Colonoscopy, flexible; with control of                            bleeding, any method Diagnosis Code(s):        --- Professional ---                           K92.1, Melena (includes Hematochezia)                           K63.3, Ulcer of intestine                           K64.0, First degree hemorrhoids CPT copyright 2019 American Medical Association. All rights reserved. The codes documented in this report are preliminary and upon coder review may  be revised to meet current compliance requirements. [name], MD 10/03/2019 8:36:12 AM This report has been signed electronically. Number of Addenda: 0

## 2019-10-03 NOTE — Care Management Important Message (Signed)
Important Message  Patient Details IM Letter given to [place] Case Manager to present to the Patient Name: Matthew Zimmerman MRN: [id number] Date of Birth: 1946/06/06   Medicare Important Message Given:  Yes     Kerin Salen 10/03/2019, 12:53 PM

## 2019-10-03 NOTE — Progress Notes (Signed)
PT Cancellation Note  Patient Details Name: Matthew Zimmerman MRN: [id number] DOB: Jan 12, 1946   Cancelled Treatment:    Reason Eval/Treat Not Completed: Medical issues which prohibited therapy  Pt had IVC filter placed today and hgb remain low at 6.7.  Will f/u as able.  Maggie Font, PT Acute Rehab Services Pager [phone] [facility] 10/03/2019, 4:32 PM

## 2019-10-03 NOTE — Anesthesia Postprocedure Evaluation (Signed)
Anesthesia Post Note  Patient: Matthew Zimmerman  Procedure(s) Performed: COLONOSCOPY WITH PROPOFOL (N/A ) HEMOSTASIS CLIP PLACEMENT SUBMUCOSAL INJECTION     Patient location during evaluation: PACU Anesthesia Type: MAC Level of consciousness: awake and alert Pain management: pain level controlled Vital Signs Assessment: post-procedure vital signs reviewed and stable Respiratory status: spontaneous breathing, nonlabored ventilation and respiratory function stable Cardiovascular status: stable and blood pressure returned to baseline Anesthetic complications: no    Last Vitals:  Vitals:   10/03/19 0840 10/03/19 0857  BP: (!) 136/56 132/63  Pulse: 80 77  Resp: (!) 25 16  Temp:  37 C  SpO2: 97% 99%                  Audry Pili

## 2019-10-03 NOTE — Interval H&P Note (Signed)
History and Physical Interval Note:  10/03/2019 7:39 AM  Matthew Zimmerman  has presented today for surgery, with the diagnosis of GI Bleed.  The various methods of treatment have been discussed with the patient and family. After consideration of risks, benefits and other options for treatment, the patient has consented to  Procedure(s): COLONOSCOPY WITH PROPOFOL (N/A) as a surgical intervention.  The patient's history has been reviewed, patient examined, no change in status, stable for surgery.  I have reviewed the patient's chart and labs.  Questions were answered to the patient's satisfaction.     [name]

## 2019-10-03 NOTE — Progress Notes (Signed)
PROGRESS NOTE  Matthew Zimmerman  DOB: 05-24-1946  PCP: Jani Gravel, MD [id number]  DOA: 09/30/2019 Admitted From: Home  LOS: 3 days   Chief Complaint  Patient presents with  . GI Bleeding   Brief narrative: Matthew Zimmerman is a 74 y.o. male with PMH of HTN, HLD, DM2, PE on Xarelto, GERD and recent GI bleed with 2 colonic AVMs (cecum and transverse colon) that were fulgurated and hemoclipped by Dr. Therisa Doyne on 09/20/19. Discharged on 09/21/19 with Hgb 9.5.  As advised, patient resumed Xarelto on 09/23/2019.  On 1/26, patient presented to the ED with complaint of acute onset large BRBPR. When he stood up from the toilet he passed out. Reports vomiting when EMS came but he is not sure if he had blood in the vomitus.  In the ED, patient was hemodynamically stable. Hemoglobin was noted to have dropped from 9.5-7.5.   Patient was given 2 units PRBC transfusion. Admitted to hospitalist medicine service. GI consultation obtained.  Subjective: Patient was seen and examined this morning.  Lying down in bed.  Underwent colonoscopy today.  Noted to have a 1 single ulceration cecum likely the source of bleeding this time.  Patient also underwent an IVC filter placement this afternoon.   Assessment/Plan: GI bleeding, likely rebleeding from colonic AVMs -Seen by GI.  -Underwent colonoscopy today.  Per GI note, no active bleeding was seen. A single (solitary) ulcer in the cecum noted. - Ulcerated mucosa in the transverse colon. Injected. Clips were placed.  Acute on chronic anemia -Hemoglobin was noted to have dropped from 9.5 at last discharge to 7.5 on this admission.  Patient was given 3 units PRBC transfusion. Hemoglobin still remains still high at 6.7 this morning. I will give him 1 more unit of PRBC today. -Repeat hemoglobin tomorrow.  History of pulmonary embolism -Reports to episodes of pulmonary embolism in the past, last was more than a year ago from a right leg DVT. -Discussed with IR.   Patient qualified for IVC filter.  I have placed it today.  No plan to resume anticoagulation.  Syncope -likely due to blood loss.  Neurological exam normal.  Essential hypertension -Home meds include metoprolol succinate, aspirin, statin -Continue metoprolol and statin.  Keep aspirin on hold  Diabetes mellitus -Home meds include Farxiga, Novolin 70/30 25 units twice daily, Metformin 500 mg twice daily,  Mobility: Encourage ambulation Diet: Clear liquid diet Fluid: Normal saline to reduce to 50 mils per hour DVT prophylaxis:  SCDs Code Status:  Full code Family Communication:  Not at bedside Expected Discharge:  Continue inpatient management  Consultants:  GI  Procedures:    Antimicrobials: Anti-infectives (From admission, onward)   None        Code Status: Full Code   Diet Order            Diet clear liquid Room service appropriate? Yes; Fluid consistency: Thin  Diet effective now              Infusions:  . sodium chloride 100 mL/hr at 10/02/19 2036  . sodium chloride 20 mL/hr at 10/02/19 [id number]    Scheduled Meds: . sodium chloride   Intravenous Once  . sodium chloride   Intravenous Once  . sodium chloride   Intravenous Once  . atorvastatin  10 mg Oral Daily  . insulin aspart  0-15 Units Subcutaneous Q4H  . lidocaine      . metoprolol succinate  25 mg Oral Daily  . vitamin B-12  100 mcg  Oral Daily  . [START ON 10/06/2019] Vitamin D (Ergocalciferol)  50,000 Units Oral Q7 days    PRN meds: morphine injection, ondansetron **OR** ondansetron (ZOFRAN) IV, sodium chloride flush, traMADol   Objective: Vitals:   10/03/19 1125 10/03/19 1137  BP: 119/66 122/60  Pulse: 77 73  Resp:  17  Temp:  98.1 F (36.7 C)  SpO2: 90% 100%    Intake/Output Summary (Last 24 hours) at 10/03/2019 1447 Last data filed at 10/03/2019 1400 Gross per 24 hour  Intake 3623.23 ml  Output 2900 ml  Net 723.23 ml   Filed Weights   09/30/19 0418  Weight: 86 kg   Weight  change:  Body mass index is 28.83 kg/m.   Physical Exam: General exam: Appears calm and comfortable.  Skin: No rashes, lesions or ulcers. HEENT: Atraumatic, normocephalic, supple neck, no obvious bleeding Lungs: Clear to auscultation bilaterally CVS: Regular rate and rhythm, no murmur GI/Abd soft, nontender, nondistended, bowel sound present CNS: Alert, awake monitor x3 Psychiatry: Mood appropriate Extremities: No pedal edema, no calf tenderness  Data Review: I have personally reviewed the laboratory data and studies available.  Recent Labs  Lab 09/30/19 0410 09/30/19 2048 10/01/19 0145 10/01/19 0924 10/01/19 2256 10/02/19 0506 10/03/19 0903  WBC 6.6  --   --   --  13.0*  --  9.6  NEUTROABS 3.1  --   --   --   --   --  6.0  HGB 7.5*   < > 8.4* 8.7* 7.0* 6.2* 6.7*  HCT 26.3*   < > 26.6* 28.7* 22.4* 20.3* 21.0*  MCV 81.9  --   --   --  80.9  --  83.3  PLT 273  --   --   --  211  --  203   < > = values in this interval not displayed.   Recent Labs  Lab 09/30/19 0410 10/01/19 0145 10/02/19 0506 10/03/19 0903  NA 142 139 136 136  K 4.1 4.6 4.8 3.9  CL 112* 109 109 104  CO2 23 24 23 23   GLUCOSE 193* 136* 203* 171*  BUN 16 11 16 13   CREATININE 1.27* 1.11 1.16 1.00  CALCIUM 8.1* 7.9* 7.8* 7.8*    Terrilee Croak, MD  [facility] 10/03/2019

## 2019-10-04 LAB — TYPE AND SCREEN
ABO/RH(D): A POS
Antibody Screen: NEGATIVE
Unit division: 0
Unit division: 0
Unit division: 0
Unit division: 0

## 2019-10-04 LAB — CBC WITH DIFFERENTIAL/PLATELET
Abs Immature Granulocytes: 0.03 10*3/uL (ref 0.00–0.07)
Basophils Absolute: 0 10*3/uL (ref 0.0–0.1)
Basophils Relative: 1 %
Eosinophils Absolute: 0.5 10*3/uL (ref 0.0–0.5)
Eosinophils Relative: 8 %
HCT: 22.5 % — ABNORMAL LOW (ref 39.0–52.0)
Hemoglobin: 7 g/dL — ABNORMAL LOW (ref 13.0–17.0)
Immature Granulocytes: 0 %
Lymphocytes Relative: 20 %
Lymphs Abs: 1.4 10*3/uL (ref 0.7–4.0)
MCH: 26.2 pg (ref 26.0–34.0)
MCHC: 31.1 g/dL (ref 30.0–36.0)
MCV: 84.3 fL (ref 80.0–100.0)
Monocytes Absolute: 0.8 10*3/uL (ref 0.1–1.0)
Monocytes Relative: 12 %
Neutro Abs: 4.2 10*3/uL (ref 1.7–7.7)
Neutrophils Relative %: 59 %
Platelets: 240 10*3/uL (ref 150–400)
RBC: 2.67 MIL/uL — ABNORMAL LOW (ref 4.22–5.81)
RDW: 16.3 % — ABNORMAL HIGH (ref 11.5–15.5)
WBC: 7 10*3/uL (ref 4.0–10.5)
nRBC: 0.4 % — ABNORMAL HIGH (ref 0.0–0.2)

## 2019-10-04 LAB — BPAM RBC
Blood Product Expiration Date: 202102092359
Blood Product Expiration Date: 202102122359
Blood Product Expiration Date: 202102122359
Blood Product Expiration Date: 202102152359
ISSUE DATE / TIME: 202101261115
ISSUE DATE / TIME: 202101261526
ISSUE DATE / TIME: 202101280943
ISSUE DATE / TIME: 202101291547
Unit Type and Rh: 6200
Unit Type and Rh: 6200
Unit Type and Rh: 6200
Unit Type and Rh: 6200

## 2019-10-04 LAB — BASIC METABOLIC PANEL
Anion gap: 5 (ref 5–15)
BUN: 6 mg/dL — ABNORMAL LOW (ref 8–23)
CO2: 27 mmol/L (ref 22–32)
Calcium: 8.1 mg/dL — ABNORMAL LOW (ref 8.9–10.3)
Chloride: 106 mmol/L (ref 98–111)
Creatinine, Ser: 0.88 mg/dL (ref 0.61–1.24)
GFR calc Af Amer: >60 mL/min (ref >60)
GFR calc non Af Amer: >60 mL/min (ref >60)
Glucose, Bld: 123 mg/dL — ABNORMAL HIGH (ref 70–99)
Potassium: 3.5 mmol/L (ref 3.5–5.1)
Sodium: 138 mmol/L (ref 135–145)

## 2019-10-04 LAB — GLUCOSE, CAPILLARY
Glucose-Capillary: 120 mg/dL — ABNORMAL HIGH (ref 70–99)
Glucose-Capillary: 122 mg/dL — ABNORMAL HIGH (ref 70–99)
Glucose-Capillary: 134 mg/dL — ABNORMAL HIGH (ref 70–99)
Glucose-Capillary: 147 mg/dL — ABNORMAL HIGH (ref 70–99)
Glucose-Capillary: 153 mg/dL — ABNORMAL HIGH (ref 70–99)
Glucose-Capillary: 209 mg/dL — ABNORMAL HIGH (ref 70–99)

## 2019-10-04 LAB — HEMOGLOBIN AND HEMATOCRIT, BLOOD
HCT: 29.3 % — ABNORMAL LOW (ref 39.0–52.0)
Hemoglobin: 9.3 g/dL — ABNORMAL LOW (ref 13.0–17.0)

## 2019-10-04 LAB — PREPARE RBC (CROSSMATCH)

## 2019-10-04 MED ORDER — SODIUM CHLORIDE 0.9% IV SOLUTION: Freq: Once | INTRAVENOUS | Status: DC

## 2019-10-04 NOTE — Plan of Care (Signed)
  Problem: Clinical Measurements: Goal: Will remain free from infection Outcome: Progressing   Problem: Clinical Measurements: Goal: Diagnostic test results will improve Outcome: Progressing   Problem: Clinical Measurements: Goal: Respiratory complications will improve Outcome: Progressing   Problem: Clinical Measurements: Goal: Cardiovascular complication will be avoided Outcome: Progressing   

## 2019-10-04 NOTE — Progress Notes (Signed)
PT Cancellation Note  Patient Details Name: Matthew Zimmerman MRN: [id number] DOB: 01/12/1946   Cancelled Treatment:     PT deferred this am at request of RN.  Pt with Hgb of 7.0 with transfusion ordered.  Will follow.   Marina Desire 10/04/2019, 9:34 AM

## 2019-10-04 NOTE — Progress Notes (Signed)
Subjective: No bowel movement since procedure. No blood in stool. Drop in Hgb overnight: received another blood transfusion  Objective: Vital signs in last 24 hours: Temp:  [98.4 F (36.9 C)-99.5 F (37.5 C)] 99.3 F (37.4 C) (01/30 1521) Pulse Rate:  [64-74] 64 (01/30 1521) Resp:  [16-18] 18 (01/30 1521) BP: (122-142)/(55-80) 140/72 (01/30 1521) SpO2:  [98 %-100 %] 100 % (01/30 1521) Weight change:  Last BM Date: 10/02/19  PE: GEN:  NAD ABD:  Soft, non tender  Lab Results: CBC    Component Value Date/Time   WBC 7.0 10/04/2019 0335   RBC 2.67 (L) 10/04/2019 0335   HGB 7.0 (L) 10/04/2019 0335   HCT 22.5 (L) 10/04/2019 0335   PLT 240 10/04/2019 0335   MCV 84.3 10/04/2019 0335   MCV 86.1 10/15/2012 1358   MCH 26.2 10/04/2019 0335   MCHC 31.1 10/04/2019 0335   RDW 16.3 (H) 10/04/2019 0335   LYMPHSABS 1.4 10/04/2019 0335   MONOABS 0.8 10/04/2019 0335   EOSABS 0.5 10/04/2019 0335   BASOSABS 0.0 10/04/2019 0335   Assessment:  1.  Painless hematochezia. Recurrent.  Colonoscopy x 2, most recently yesterday with further clipping of transverse colon ulcerated region.  Not sure if ulcerated spot in transverse colon from yesterday was bleeding spot, or just residual finding after APC to that area a couple weeks ago. 2.  Acute blood loss anemia, recurrent. 3.  Recurrent pulmonary emboli, on chronic rivaroxaban (currently on hold).  Plan:  1.  Follow CBC, transfuse as needed. 2.  If continues to have recurrent bleeding and/or hgb drop, would need to consider endoscopy +/- capsule endoscopy. 3.  PPI. 4.  Advance diet as tolerated. 5.  [facility] will follow.   Landry Dyke 10/04/2019, 3:48 PM   Cell [phone] If no answer or after 5 PM call [phone]

## 2019-10-04 NOTE — Progress Notes (Addendum)
PROGRESS NOTE  Matthew Zimmerman  DOB: 04-10-1946  PCP: Matthew Gravel, MD [id number]  DOA: 09/30/2019 Admitted From: Home  LOS: 4 days   Chief Complaint  Patient presents with  . GI Bleeding   Brief narrative: Matthew Zimmerman is a 74 y.o. male with PMH of HTN, HLD, DM2, PE on Xarelto, GERD and recent GI bleed with 2 colonic AVMs (cecum and transverse colon) that were fulgurated and hemoclipped by Dr. Therisa Doyne on 09/20/19. Discharged on 09/21/19 with Hgb 9.5.  As advised, patient resumed Xarelto on 09/23/2019.  On 1/26, patient presented to the ED with complaint of acute onset large BRBPR. When he stood up from the toilet he passed out. Reports vomiting when EMS came but he is not sure if he had blood in the vomitus.  In the ED, patient was hemodynamically stable. Hemoglobin was noted to have dropped from 9.5-7.5.   Patient was given 2 units PRBC transfusion. Admitted to hospitalist medicine service. GI consultation obtained.  Subjective: Patient was seen and examined this morning.  Lying down in bed.   Yesterday, patient underwent colonoscopy as well as IVC filter placement.  Assessment/Plan: GI bleeding, likely rebleeding from colonic AVMs -Seen by GI.  -1/29, underwent colonoscopy today.  Per GI note, no active bleeding was seen. A single (solitary) ulcer in the cecum noted. Ulcerated mucosa noted in the transverse colon. Injected. Clips were placed.  Acute on chronic anemia -Hemoglobin was noted to have dropped from 9.5 at last discharge to 7.5 on this admission.   -Patient received 5 units. Hemoglobin is still dropped from 7.5 yesterday to 7 today.  1 more unit of PRBC was given.  -We'll repeat hemoglobin tomorrow.  History of pulmonary embolism -Reports to episodes of pulmonary embolism in the past, last was more than a year ago from a right leg DVT. -1/29.,  IVC filter was placed.  Patient does need to be on anticoagulation for any further  Syncope -At admission.  Likely due  to blood loss.  Neurological exam normal.  Essential hypertension -Home meds include metoprolol succinate, aspirin, statin -Continue metoprolol and statin.  Keep aspirin on hold  Diabetes mellitus -Home meds include Farxiga, Novolin 70/30 25 units twice daily, Metformin 500 mg twice daily, -Currently denies insulin with Accu-Cheks.  Mobility: Encourage ambulation Diet: Currently on clear liquid diet. Advance to full liquid diet. Fluid: IV fluids stopped DVT prophylaxis:  SCDs Code Status:  Full code Family Communication:  Not at bedside Expected Discharge:  Continue inpatient management.  Plan is to discharge to home tomorrow  Consultants:  GI  Procedures:    Antimicrobials: Anti-infectives (From admission, onward)   None        Code Status: Full Code   Diet Order            Diet full liquid Room service appropriate? Yes; Fluid consistency: Thin  Diet effective now              Infusions:  . sodium chloride 20 mL/hr at 10/02/19 [id number]    Scheduled Meds: . sodium chloride   Intravenous Once  . sodium chloride   Intravenous Once  . sodium chloride   Intravenous Once  . atorvastatin  10 mg Oral Daily  . insulin aspart  0-15 Units Subcutaneous Q4H  . metoprolol succinate  25 mg Oral Daily  . vitamin B-12  100 mcg Oral Daily  . [START ON 10/06/2019] Vitamin D (Ergocalciferol)  50,000 Units Oral Q7 days    PRN meds: morphine  injection, ondansetron **OR** ondansetron (ZOFRAN) IV, sodium chloride flush, traMADol   Objective: Vitals:   10/04/19 1211 10/04/19 1227  BP: 132/71 (!) 142/67  Pulse: 68 68  Resp: 18 18  Temp: 99.1 F (37.3 C) 99.5 F (37.5 C)  SpO2: 98% 98%    Intake/Output Summary (Last 24 hours) at 10/04/2019 1401 Last data filed at 10/04/2019 1321 Gross per 24 hour  Intake 4255.36 ml  Output 3800 ml  Net 455.36 ml   Filed Weights   09/30/19 0418  Weight: 86 kg   Weight change:  Body mass index is 28.83 kg/m.   Physical  Exam: General exam: Appears calm and comfortable.  Not in distress. Skin: No rashes, lesions or ulcers. HEENT: Atraumatic, normocephalic, supple neck, no obvious bleeding Lungs: Clear to auscultation bilaterally CVS: Regular rate and rhythm, no murmur GI/Abd soft, nontender, nondistended, bowel sound present CNS: Alert, awake monitor x3 Psychiatry: Mood appropriate Extremities: No pedal edema, no calf tenderness  Data Review: I have personally reviewed the laboratory data and studies available.  Recent Labs  Lab 09/30/19 0410 09/30/19 2048 10/01/19 2256 10/02/19 0506 10/03/19 0903 10/03/19 1957 10/04/19 0335  WBC 6.6  --  13.0*  --  9.6  --  7.0  NEUTROABS 3.1  --   --   --  6.0  --  4.2  HGB 7.5*   < > 7.0* 6.2* 6.7* 7.5* 7.0*  HCT 26.3*   < > 22.4* 20.3* 21.0* 24.3* 22.5*  MCV 81.9  --  80.9  --  83.3  --  84.3  PLT 273  --  211  --  203  --  240   < > = values in this interval not displayed.   Recent Labs  Lab 09/30/19 0410 10/01/19 0145 10/02/19 0506 10/03/19 0903 10/04/19 0335  NA 142 139 136 136 138  K 4.1 4.6 4.8 3.9 3.5  CL 112* 109 109 104 106  CO2 23 24 23 23 27   GLUCOSE 193* 136* 203* 171* 123*  BUN 16 11 16 13  6*  CREATININE 1.27* 1.11 1.16 1.00 0.88  CALCIUM 8.1* 7.9* 7.8* 7.8* 8.1*    Terrilee Croak, MD  [facility] 10/04/2019

## 2019-10-04 NOTE — Evaluation (Signed)
Physical Therapy Evaluation Patient Details Name: Matthew Zimmerman MRN: [id number] DOB: [birth year] Today's Date: 10/04/2019   History of Present Illness  Pt admitted s/p fall 2* syncope caused by blood loss 2* GIB.  Pt states he injured R knee and ankle in fall - imaging indicates no fractures.   Pt s/p colonscopy and IVC filter placement 10/03/19 and with hx of DM  Clinical Impression  Pt admitted as above and presenting with functional mobility limitations 2* generalized weakness, limited endurance and inability to tolerate R LE WB with ambulation.  Pt hopes to dc to home but reports difficult access 2* multi-level home and spouse is limited in ability to assist.  Dependent on acute stay progress and level of assist that can be arranged for home, pt may need to consider SNF level rehab to maximize IND and safety prior to return home.    Follow Up Recommendations Home health PT;SNF    Equipment Recommendations  Rolling walker with 5" wheels    Recommendations for Other Services OT consult     Precautions / Restrictions Precautions Precautions: Fall Precaution Comments: Pt tolerating no wt on R LE 2* foot/ankle pain Required Braces or Orthoses: Other Brace Other Brace: Knee sleeve in place; no oders regarding ankle Restrictions Weight Bearing Restrictions: No      Mobility  Bed Mobility Overal bed mobility: Needs Assistance Bed Mobility: Supine to Sit     Supine to sit: Min assist     General bed mobility comments: increaed time with assist to bring trunk to upright  Transfers Overall transfer level: Needs assistance Equipment used: Rolling walker (2 wheeled) Transfers: Sit to/from Stand Sit to Stand: Min assist         General transfer comment: cues for use of UEs to self assist.  Assist to bring wt up and fwd and to balance in standing  Ambulation/Gait Ambulation/Gait assistance: Min assist;+2 safety/equipment Gait Distance (Feet): 11 Feet Assistive device:  Rolling walker (2 wheeled) Gait Pattern/deviations: Step-to pattern;Decreased step length - right;Decreased step length - left;Shuffle;Trunk flexed Gait velocity: decr   General Gait Details: pt tolerating no WB on R; pt unable to extend to achieve heel contact on R 2* pain  Stairs            Wheelchair Mobility    Modified Rankin (Stroke Patients Only)       Balance Overall balance assessment: Needs assistance Sitting-balance support: Feet supported;No upper extremity supported Sitting balance-Leahy Scale: Good     Standing balance support: Bilateral upper extremity supported Standing balance-Leahy Scale: Poor Standing balance comment: Pt reliant on UEs and RW to maintain balance 2* pain limiting ability to WB on R LE                             Pertinent Vitals/Pain Pain Assessment: Faces Faces Pain Scale: Hurts even more Pain Location: R ankle with any attempt to extend knee to place heel on floor or WB Pain Descriptors / Indicators: Grimacing;Guarding Pain Intervention(s): Limited activity within patient's tolerance;Monitored during session    Home Living Family/patient expects to be discharged to:: Private residence Living Arrangements: Spouse/significant other Available Help at Discharge: Family;Available 24 hours/day Type of Home: House Home Access: Stairs to enter Entrance Stairs-Rails: Right Entrance Stairs-Number of Steps: 4 Home Layout: Multi-level Home Equipment: Other (comment)(tub seat)      Prior Function Level of Independence: Independent  Hand Dominance        Extremity/Trunk Assessment   Upper Extremity Assessment Upper Extremity Assessment: Overall WFL for tasks assessed    Lower Extremity Assessment Lower Extremity Assessment: RLE deficits/detail RLE Deficits / Details: mild swelling noted at knee; mod swelling noted at foot/ankle lateral aspect; pt able to lift R LE unassisted but unable to tolerate  movement at ankle    Cervical / Trunk Assessment Cervical / Trunk Assessment: Normal  Communication   Communication: HOH  Cognition Arousal/Alertness: Awake/alert Behavior During Therapy: WFL for tasks assessed/performed Overall Cognitive Status: Within Functional Limits for tasks assessed                                        General Comments      Exercises     Assessment/Plan    PT Assessment Patient needs continued PT services  PT Problem List Decreased strength;Decreased range of motion;Decreased activity tolerance;Decreased balance;Decreased mobility;Decreased knowledge of use of DME;Pain       PT Treatment Interventions DME instruction;Gait training;Stair training;Functional mobility training;Therapeutic activities;Therapeutic exercise;Balance training;Patient/family education    PT Goals (Current goals can be found in the Care Plan section)  Acute Rehab PT Goals Patient Stated Goal: Regain IND PT Goal Formulation: With patient Time For Goal Achievement: 10/18/19 Potential to Achieve Goals: Fair    Frequency Min 5X/week   Barriers to discharge Decreased caregiver support;Inaccessible home environment stairs and spouse who can not physically assist    Co-evaluation               AM-PAC PT "6 Clicks" Mobility  Outcome Measure Help needed turning from your back to your side while in a flat bed without using bedrails?: A Little Help needed moving from lying on your back to sitting on the side of a flat bed without using bedrails?: A Little Help needed moving to and from a bed to a chair (including a wheelchair)?: A Lot Help needed standing up from a chair using your arms (e.g., wheelchair or bedside chair)?: A Little Help needed to walk in hospital room?: A Lot Help needed climbing 3-5 steps with a railing? : A Lot 6 Click Score: 15    End of Session Equipment Utilized During Treatment: Gait belt Activity Tolerance: Patient limited by  pain;Patient limited by fatigue Patient left: in chair;with call bell/phone within reach;with nursing/sitter in room Nurse Communication: Mobility status PT Visit Diagnosis: Difficulty in walking, not elsewhere classified (R26.2)    Time: [id number] PT Time Calculation (min) (ACUTE ONLY): 27 min   Charges:   PT Evaluation $PT Eval Low Complexity: 1 Low PT Treatments $Gait Training: 8-22 mins        [place] Pager [phone] Office [phone]   Simora Dingee 10/04/2019, 5:29 PM

## 2019-10-05 LAB — CBC WITH DIFFERENTIAL/PLATELET
Abs Immature Granulocytes: 0.06 10*3/uL (ref 0.00–0.07)
Basophils Absolute: 0.1 10*3/uL (ref 0.0–0.1)
Basophils Relative: 1 %
Eosinophils Absolute: 0.6 10*3/uL — ABNORMAL HIGH (ref 0.0–0.5)
Eosinophils Relative: 8 %
HCT: 28.2 % — ABNORMAL LOW (ref 39.0–52.0)
Hemoglobin: 9.3 g/dL — ABNORMAL LOW (ref 13.0–17.0)
Immature Granulocytes: 1 %
Lymphocytes Relative: 21 %
Lymphs Abs: 1.5 10*3/uL (ref 0.7–4.0)
MCH: 27.3 pg (ref 26.0–34.0)
MCHC: 33 g/dL (ref 30.0–36.0)
MCV: 82.7 fL (ref 80.0–100.0)
Monocytes Absolute: 0.8 10*3/uL (ref 0.1–1.0)
Monocytes Relative: 11 %
Neutro Abs: 4.3 10*3/uL (ref 1.7–7.7)
Neutrophils Relative %: 58 %
Platelets: 238 10*3/uL (ref 150–400)
RBC: 3.41 MIL/uL — ABNORMAL LOW (ref 4.22–5.81)
RDW: 16.7 % — ABNORMAL HIGH (ref 11.5–15.5)
WBC: 7.4 10*3/uL (ref 4.0–10.5)
nRBC: 0.5 % — ABNORMAL HIGH (ref 0.0–0.2)

## 2019-10-05 LAB — GLUCOSE, CAPILLARY
Glucose-Capillary: 112 mg/dL — ABNORMAL HIGH (ref 70–99)
Glucose-Capillary: 128 mg/dL — ABNORMAL HIGH (ref 70–99)
Glucose-Capillary: 139 mg/dL — ABNORMAL HIGH (ref 70–99)
Glucose-Capillary: 302 mg/dL — ABNORMAL HIGH (ref 70–99)
Glucose-Capillary: 341 mg/dL — ABNORMAL HIGH (ref 70–99)

## 2019-10-05 NOTE — Progress Notes (Signed)
Discharge paperwork discussed w pt at the bedside.  He demonstrated understanding.  DME at bedside. HHPT set up by case management. Pt escorted by wheelchair to main lobby in stable condition.

## 2019-10-05 NOTE — Progress Notes (Signed)
Physical Therapy Treatment Patient Details Name: Matthew Zimmerman MRN: [id number] DOB: 28-Feb-1946 Today's Date: 10/05/2019    History of Present Illness Pt admitted s/p fall 2* syncope caused by blood loss 2* GIB.  Pt states he injured R knee and ankle in fall - imaging indicates no fractures.   Pt s/p colonscopy and IVC filter placement 10/03/19 and with hx of DM    PT Comments    Pt requires increased time for all mobility tasks but continues to progress with mobility.  This session pt able to negotiate stairs with rail, crutch, cues and min assist - written instruction provided.   Follow Up Recommendations  Home health PT;SNF     Equipment Recommendations  Rolling walker with 5" wheels    Recommendations for Other Services OT consult     Precautions / Restrictions Precautions Precautions: Fall Precaution Comments: Pt tolerating ltd wt on R LE 2* foot/ankle pain Required Braces or Orthoses: Other Brace Other Brace: Knee sleeve in place; no oders regarding ankle Restrictions Weight Bearing Restrictions: No    Mobility  Bed Mobility Overal bed mobility: Needs Assistance Bed Mobility: Supine to Sit     Supine to sit: Supervision     General bed mobility comments: up in chair and requests back to same  Transfers Overall transfer level: Needs assistance Equipment used: Rolling walker (2 wheeled) Transfers: Sit to/from Stand Sit to Stand: Min guard         General transfer comment: cues for LE management and use of UEs to self assist.   Ambulation/Gait Ambulation/Gait assistance: Min guard Gait Distance (Feet): 38 Feet Assistive device: Rolling walker (2 wheeled) Gait Pattern/deviations: Step-to pattern;Decreased step length - right;Decreased step length - left;Shuffle;Trunk flexed Gait velocity: decr   General Gait Details: Increased time with cues for sequence, posture, position from RW, increased UE WB   Stairs Stairs: Yes Stairs assistance: Min  assist Stair Management: One rail Right;Step to pattern;Forwards;With crutches Number of Stairs: 5 General stair comments: 2+3 stairs with crutch, rail and cues for sequence and foot/crutch placement.  Written instruction provided   Wheelchair Mobility    Modified Rankin (Stroke Patients Only)       Balance Overall balance assessment: Needs assistance Sitting-balance support: Feet supported;No upper extremity supported Sitting balance-Leahy Scale: Good     Standing balance support: Bilateral upper extremity supported Standing balance-Leahy Scale: Poor Standing balance comment: Pt reliant on UEs and RW to maintain balance 2* pain limiting ability to WB on R LE                            Cognition Arousal/Alertness: Awake/alert Behavior During Therapy: WFL for tasks assessed/performed Overall Cognitive Status: Within Functional Limits for tasks assessed                                        Exercises      General Comments        Pertinent Vitals/Pain Pain Assessment: 0-10 Pain Score: 4  Pain Location: R foot/ankle Pain Descriptors / Indicators: Grimacing;Guarding Pain Intervention(s): Limited activity within patient's tolerance;Monitored during session;Premedicated before session    Home Living                      Prior Function            PT Goals (current goals  can now be found in the care plan section) Acute Rehab PT Goals Patient Stated Goal: Regain IND PT Goal Formulation: With patient Time For Goal Achievement: 10/18/19 Potential to Achieve Goals: Fair Progress towards PT goals: Progressing toward goals    Frequency    Min 5X/week      PT Plan Current plan remains appropriate    Co-evaluation              AM-PAC PT "6 Clicks" Mobility   Outcome Measure  Help needed turning from your back to your side while in a flat bed without using bedrails?: A Little Help needed moving from lying on your back  to sitting on the side of a flat bed without using bedrails?: A Little Help needed moving to and from a bed to a chair (including a wheelchair)?: A Little Help needed standing up from a chair using your arms (e.g., wheelchair or bedside chair)?: A Little Help needed to walk in hospital room?: A Little Help needed climbing 3-5 steps with a railing? : A Little 6 Click Score: 18    End of Session Equipment Utilized During Treatment: Gait belt Activity Tolerance: Patient tolerated treatment well Patient left: in chair;with call bell/phone within reach Nurse Communication: Mobility status PT Visit Diagnosis: Difficulty in walking, not elsewhere classified (R26.2)     Time: [id number] PT Time Calculation (min) (ACUTE ONLY): 25 min  Charges:  $Gait Training: 8-22 mins $Therapeutic Activity: 8-22 mins                     Debe Coder PT Acute Rehabilitation Services Pager [phone] Office [phone]    Lakeisa Heninger 10/05/2019, 12:11 PM

## 2019-10-05 NOTE — Progress Notes (Addendum)
Unable to arrange HHPT. I contacted the following [place] agencies:  1. [place], he declined the referral.   2. [facility], sw [place], she declined the referral.  [place], left a voicemail for [place], received message from [place] that he can't take the referral.  [place], provided all information needed for referral, he sent a message that he is unable to accept the pt.   5. [facility], left a VM for [place]ill B[place], awaiting for return call.  Met with pt. Informed him that I was not able to arrange HHPT. Encouraged pt to discuss outpt rehab with his PCP on his f/u appt. Contacted pt's wife at [phone], informed her that I was not able to arrange [facility]. Discussed with wife outpt rehab if PCP agrees and encouraged wife to discuss this with his PCP on his f/u appt. She verbalized understanding. Notified Dr. Pietro Cassis that I was not able to arrange [facility] PT but I discussed outpt rehab.

## 2019-10-05 NOTE — Progress Notes (Signed)
Received call from [place] with [facility] and she accepted the referral. Met with pt and nurse and notified them that [facility] will provide the [place]. Also notified pt's wife.

## 2019-10-05 NOTE — Discharge Instructions (Signed)
Inferior Vena Cava Filter Insertion, Care After This sheet gives you information about how to care for yourself after your procedure. Your health care provider may also give you more specific instructions. If you have problems or questions, contact your health care provider. What can I expect after the procedure? After your procedure, it is common to have:  Mild pain in the area where the filter was inserted.  Mild bruising in the area where the filter was inserted. Follow these instructions at home: Insertion site care   Follow instructions from your health care provider about how to take care of the site where a catheter was inserted at your neck or groin (insertion site). Make sure you: ? Wash your hands with soap and water before you change your bandage (dressing). If soap and water are not available, use hand sanitizer. ? Change your dressing as told by your health care provider.  Check your insertion site every day for signs of infection. Check for: ? More redness, swelling, or pain. ? More fluid or blood. ? Warmth. ? Pus or a bad smell.  Keep the insertion site clean and dry.  Do not shower, bathe, use a hot tub, or let the dressing get wet until your health care provider approves. General instructions  Take over-the-counter and prescription medicines only as told by your health care provider.  Avoid heavy lifting or hard activities for 48 hours after the procedure or as told by your health care provider.  Do not drive for 24 hours if you were given a medicine to help you relax (sedative).  Do not drive or use heavy machinery while taking prescription pain medicine.  Do not go back to school or work until your health care provider approves.  Keep all follow-up visits as told by your health care provider. This is important. Contact a health care provider if:  You have more redness, swelling, or pain around your insertion site.  You have more fluid or blood coming from  your insertion site.  Your insertion site feels warm to the touch.  You have pus or a bad smell coming from your insertion site.  You have a fever.  You are dizzy.  You have nausea and vomiting.  You develop a rash. Get help right away if:  You develop chest pain, a cough, or difficulty breathing.  You develop shortness of breath, feel faint, or pass out.  You cough up blood.  You have severe pain in your abdomen.  You develop swelling and discoloration or pain in your legs.  Your legs become pale and cold or blue.  You develop weakness, difficulty moving your arms or legs, or balance problems.  You develop problems with speech or vision. These symptoms may represent a serious problem that is an emergency. Do not wait to see if the symptoms will go away. Get medical help right away. Call your local emergency services (911 in the [place] Do not drive yourself to the hospital. Summary  After your insertion procedure, it is common to have mild pain and bruising.  Do not shower, bathe, use a hot tub, or let the dressing get wet until your health care provider approves.  Every day, check for signs of infection where a catheter was inserted at your neck or groin (insertion site). This information is not intended to replace advice given to you by your health care provider. Make sure you discuss any questions you have with your health care provider. Document Revised: 08/03/2017 Document Reviewed:   07/12/2016 Elsevier Patient Education  2020 Elsevier Inc.  

## 2019-10-05 NOTE — Care Management Note (Signed)
Case Management Note  Patient Details  Name: Matthew Zimmerman MRN: [id number] Date of Birth: Jan 07, 1946  Subjective/Objective:                    Action/Plan:   Expected Discharge Date:  10/05/19               Expected Discharge Plan:  [place]  In-House Referral:     Discharge planning Services  CM Consult  Post Acute Care Choice:  Durable Medical Equipment, Home Health Choice offered to:  Patient, Spouse  DME Arranged:  [place] rolling DME Agency:  [facility]  HH Arranged:  PT [place]:  [facility] ([facility]), [place] ([facility]), Other - See comment  Status of Service:     If discussed at [place] of Stay Meetings, dates discussed:    Additional Comments:  Norina Buzzard, RN 10/05/2019, 1:59 PM

## 2019-10-05 NOTE — TOC Progression Note (Signed)
Transition of Care [facility]) - Progression Note    Patient Details  Name: Matthew Zimmerman MRN: [id number] Date of Birth: [birth year]  Transition of Care [facility]) CM/SW Contact  Norina Buzzard, RN Phone Number: 10/05/2019, 10:42 AM  Clinical Narrative:  74 yo M    Expected Discharge Plan: [place] Barriers to Discharge: No Barriers Identified  Expected Discharge Plan and Services Expected Discharge Plan: [place]   Discharge Planning Services: CM Consult Post Acute Care Choice: Durable Medical Equipment, Home Health Living arrangements for the past 2 months: Single Family Home Expected Discharge Date: 10/05/19               DME Arranged: Gilford Rile rolling DME Agency: [facility] Date DME Agency Contacted: 10/05/19 Time DME Agency Contacted: [phone] Representative spoke with at DME Agency: [place]: PT [place]: [place] ([place]) Date [place]: 10/05/19 Time [place]: 67 Representative spoke with at [place]: [place] ([place]) Interventions    Readmission Risk Interventions No flowsheet data found.

## 2019-10-05 NOTE — Discharge Summary (Signed)
Physician Discharge Summary  Matthew Zimmerman [id number] DOB: 1946-07-30 DOA: 09/30/2019  PCP: Jani Gravel, MD  Admit date: 09/30/2019 Discharge date: 10/05/2019  Admitted From: Home Discharge disposition: Home with home PT   Code Status: Full Code  Diet Recommendation: Cardiac/diabetic diet   Recommendations for Outpatient Follow-Up:   1. Follow-up with PCP as an outpatient 2. Follow-up with GI as an outpatient  Discharge Diagnosis:   Principal Problem:   GI (gastrointestinal bleed) Active Problems:   Essential hypertension   Pulmonary embolism (HCC)   Anemia due to acute blood loss   Syncope  History of Present Illness / Brief narrative:  Matthew Richardsonis a 74 y.o.malewith PMH of HTN, HLD, DM2, PE on Xarelto, GERD and recent GI bleed with 2 colonic AVMs (cecum and transverse colon) that were fulgurated and hemoclipped by Dr. Therisa Doyne on 09/20/19. Discharged on 09/21/19 with Hgb 9.5.  As advised, patient resumed Xarelto on 09/23/2019.  On 1/26, patient presented to the ED with complaint of acute onset large BRBPR. When he stood up from the toilet he passed out. Reports vomiting when EMS came but he is not sure if he had blood in the vomitus.  In the ED, patient was hemodynamically stable. Hemoglobin was noted to have dropped from 9.5-7.5.   Patient was given 2 units PRBC transfusion. Admitted to hospitalist medicine service. GI consultation obtained.  Hospital Course:  GI bleeding, likely rebleeding from colonic AVMs -Seen by GI.  -1/29, underwent colonoscopy today.  Per GI note, no active bleeding was seen. A single (solitary) ulcer in the cecum noted. Ulcerated mucosa noted in the transverse colon. Injected. Clips were placed.  Acute on chronic anemia -Hemoglobin was noted to have dropped from 9.5 at last discharge to 7.5 on this admission.   -Patient received 6 units total of PRBC this admission, last transfusion yesterday.  Posttransfusion H&H 9.3 yesterday evening  and this morning as well.   -No active evidence of further bleeding.  Continue to follow-up with GI/PCP as an outpatient  History of pulmonary embolism -Reports to episodes of pulmonary embolism in the past, last was more than a year ago from a right leg DVT. -1/29.,  IVC filter was placed.  Patient does need to be on anticoagulation for any further.  I have instructed patient not to resume Xarelto post discharge.  Syncope -At admission.  Likely due to blood loss. Neurological exam normal.  Essential hypertension -Home meds include metoprolol succinate, aspirin, statin -Continue metoprolol and statin.  Keep aspirin on hold  Diabetes mellitus -Home meds include Farxiga, Novolin 70/30 25 units twice daily, Metformin 500 mg twice daily, -Resume the same post discharge.  Stable for discharge to home today.  Subjective:  Seen and examined this morning.  Pleasant elderly African-American male.  Lying down in bed.  Not in distress.  No new symptoms.  No further evidence of bleeding.  Discharge Exam:   Vitals:   10/04/19 1521 10/04/19 2109 10/05/19 0416 10/05/19 0818  BP: 140/72 (!) 135/46 (!) 145/70 127/68  Pulse: 64 66 69 63  Resp: 18 16 20 17   Temp: 99.3 F (37.4 C) 98.8 F (37.1 C) 98.6 F (37 C) 99.1 F (37.3 C)  TempSrc: Oral Oral Oral Oral  SpO2: 100% 99% 96% 99%  Weight:      Height:        Body mass index is 28.83 kg/m.  General exam: Appears calm and comfortable.  Skin: No rashes, lesions or ulcers. HEENT: Atraumatic, normocephalic, supple neck,  no obvious bleeding Lungs: Clear to auscultation bilaterally CVS: Regular rate and rhythm, no murmur GI/Abd soft, nontender, nondistended, bowel sound present CNS: Alert, awake, oriented x3 Psychiatry: Mood appropriate Extremities: No edema, no calf tenderness  Discharge Instructions:  Wound care: None Discharge Instructions    Call MD for:  persistant dizziness or light-headedness   Complete by: As directed     Diet - low sodium heart healthy   Complete by: As directed    Diet Carb Modified   Complete by: As directed    For home use only DME 4 wheeled rolling walker with seat   Complete by: As directed    Patient needs a walker to treat with the following condition: Impaired mobility   Increase activity slowly   Complete by: As directed      Follow-up Information    Jani Gravel, MD Follow up.   Specialty: Internal Medicine Contact information: [place] [address] [phone]          Allergies as of 10/05/2019      Reactions   Cortisone Other (See Comments)   CBG thrown out of control      Medication List    STOP taking these medications   rivaroxaban 20 MG Tabs tablet Commonly known as: XARELTO     TAKE these medications   aspirin EC 81 MG tablet Take 81 mg by mouth daily.   atorvastatin 20 MG tablet Commonly known as: LIPITOR Take 10 mg by mouth daily.   augmented betamethasone dipropionate 0.05 % cream Commonly known as: DIPROLENE-AF Apply 1 application topically daily as needed (itchy skin near bottom).   BD Insulin Syringe U/F 31G X 5/16" 0.5 ML Misc Generic drug: Insulin Syringe-Needle U-100 2 (two) times daily.   clotrimazole-betamethasone cream Commonly known as: LOTRISONE Apply 1 application topically 2 (two) times daily.   diclofenac sodium 1 % Gel Commonly known as: VOLTAREN Apply 1 application topically 4 (four) times daily as needed (pain).   Farxiga 5 MG Tabs tablet Generic drug: dapagliflozin propanediol Take 5 mg by mouth daily.   insulin NPH-regular Human (70-30) 100 UNIT/ML injection Inject 25-35 Units into the skin 2 (two) times daily with a meal. Humulin   metFORMIN 500 MG tablet Commonly known as: GLUCOPHAGE Take 1,000 mg by mouth 2 (two) times daily with a meal.   metoprolol succinate 25 MG 24 hr tablet Commonly known as: TOPROL-XL Take 1 tablet (25 mg total) by mouth daily. Take with or immediately  following a meal. Take at night. What changed: additional instructions   ONE TOUCH ULTRA TEST test strip Generic drug: glucose blood Use to test blood sugar twice a day   OneTouch Delica Plus 0000000 Misc Use to test blood sugar twice daily   vitamin B-12 100 MCG tablet Commonly known as: CYANOCOBALAMIN Take 100 mcg by mouth daily.   Vitamin D (Ergocalciferol) 1.25 MG (50000 UNIT) Caps capsule Commonly known as: DRISDOL Take 50,000 Units by mouth every 7 (seven) days.            Durable Medical Equipment  (From admission, onward)         Start     Ordered   10/05/19 0000  For home use only DME 4 wheeled rolling walker with seat    Question:  Patient needs a walker to treat with the following condition  Answer:  Impaired mobility   10/05/19 0954          Time coordinating  discharge: 35 minutes  The results of significant diagnostics from this hospitalization (including imaging, microbiology, ancillary and laboratory) are listed below for reference.    Procedures and Diagnostic Studies:   DG Chest 2 View  Result Date: 09/30/2019 CLINICAL DATA:  Syncope, GI bleed EXAM: CHEST - 2 VIEW COMPARISON:  Radiograph 10/15/2012 FINDINGS: No consolidation, features of edema, pneumothorax, or effusion. The aorta is calcified. The remaining cardiomediastinal contours are unremarkable. No acute osseous or soft tissue abnormality. Degenerative changes are present in the imaged spine and shoulders. Telemetry leads overlie the chest. IMPRESSION: No acute cardiopulmonary abnormality. Aortic Atherosclerosis (ICD10-I70.0). Electronically Signed   By: Lovena Le M.D.   On: 09/30/2019 05:02   DG Knee 1-2 Views Right  Result Date: 10/01/2019 CLINICAL DATA:  Right knee and ankle pain since falling several days ago. EXAM: RIGHT KNEE - 1-2 VIEW COMPARISON:  None. FINDINGS: The mineralization and alignment are normal. There is no evidence of acute fracture or dislocation. Mild tricompartmental  degenerative changes, greatest in the medial compartment. Small to moderate knee joint effusion. There is patellar spurring and diffuse vascular calcifications. IMPRESSION: No acute osseous findings. Mild degenerative changes and small to moderate joint effusion. Electronically Signed   By: Richardean Sale M.D.   On: 10/01/2019 11:24   DG Ankle 2 Views Right  Result Date: 10/01/2019 CLINICAL DATA:  Right knee and ankle pain since falling several days ago. EXAM: RIGHT ANKLE - 2 VIEW COMPARISON:  Report from right ankle MRI 10/10/2013. FINDINGS: The bones appear adequately mineralized. No evidence of acute fracture or dislocation. There is moderate lateral soft tissue swelling. Prominent calcaneal spurs, appearing mildly fragmented at the Achilles insertion. There are prominent vascular calcifications. IMPRESSION: Lateral soft tissue swelling without evidence of acute fracture or dislocation. Prominent calcaneal spurs. Electronically Signed   By: Richardean Sale M.D.   On: 10/01/2019 11:25   CT Head Wo Contrast  Result Date: 09/30/2019 CLINICAL DATA:  Syncope, woke up on floor, unsure of head injury, recently restarted Xarelto EXAM: CT HEAD WITHOUT CONTRAST TECHNIQUE: Contiguous axial images were obtained from the base of the skull through the vertex without intravenous contrast. COMPARISON:  CT 05/21/2014 FINDINGS: Brain: Diffuse mild parafalcine and tentorial hyperattenuation is unchanged from comparison study 05/21/2014 and likely reflects benign dural calcification. No evidence of acute infarction, hemorrhage, hydrocephalus, extra-axial collection or mass lesion/mass effect. Patchy areas of white matter hypoattenuation are most compatible with chronic microvascular angiopathy. Symmetric prominence of the ventricles, cisterns and sulci compatible with parenchymal volume loss. Vascular: Atherosclerotic calcification of the carotid siphons. No hyperdense vessel. Skull: No significant scalp swelling or  hematoma. No calvarial fracture. Sinuses/Orbits: Paranasal sinuses and mastoid air cells are predominantly clear. Orbital structures are unremarkable aside from prior lens extractions. Other: None IMPRESSION: 1. No acute intracranial abnormality. 2. Slightly hyperattenuating appearance of the falx and tentorium is unchanged from prior and is favored to reflect chronic dural mineralization. 3. Mild chronic microvascular angiopathy and parenchymal volume loss. Electronically Signed   By: Lovena Le M.D.   On: 09/30/2019 05:08     Labs:   Basic Metabolic Panel: Recent Labs  Lab 09/30/19 0410 09/30/19 0410 10/01/19 0145 10/01/19 0145 10/02/19 0506 10/02/19 0506 10/03/19 0903 10/04/19 0335  NA 142  --  139  --  136  --  136 138  K 4.1   < > 4.6   < > 4.8   < > 3.9 3.5  CL 112*  --  109  --  109  --  104 106  CO2 23  --  24  --  23  --  23 27  GLUCOSE 193*  --  136*  --  203*  --  171* 123*  BUN 16  --  11  --  16  --  13 6*  CREATININE 1.27*  --  1.11  --  1.16  --  1.00 0.88  CALCIUM 8.1*  --  7.9*  --  7.8*  --  7.8* 8.1*   < > = values in this interval not displayed.   GFR Estimated Creatinine Clearance: 78.5 mL/min (by C-G formula based on SCr of 0.88 mg/dL). Liver Function Tests: Recent Labs  Lab 09/30/19 0410  AST 16  ALT 12  ALKPHOS 53  BILITOT 0.6  PROT 5.4*  ALBUMIN 2.9*   No results for input(s): LIPASE, AMYLASE in the last 168 hours. No results for input(s): AMMONIA in the last 168 hours. Coagulation profile Recent Labs  Lab 10/03/19 1003  INR 1.1    CBC: Recent Labs  Lab 09/30/19 0410 09/30/19 2048 10/01/19 2256 10/02/19 0506 10/03/19 0903 10/03/19 1957 10/04/19 0335 10/04/19 1714 10/05/19 0757  WBC 6.6  --  13.0*  --  9.6  --  7.0  --  7.4  NEUTROABS 3.1  --   --   --  6.0  --  4.2  --  4.3  HGB 7.5*   < > 7.0*   < > 6.7* 7.5* 7.0* 9.3* 9.3*  HCT 26.3*   < > 22.4*   < > 21.0* 24.3* 22.5* 29.3* 28.2*  MCV 81.9  --  80.9  --  83.3  --  84.3  --   82.7  PLT 273  --  211  --  203  --  240  --  238   < > = values in this interval not displayed.   Cardiac Enzymes: No results for input(s): CKTOTAL, CKMB, CKMBINDEX, TROPONINI in the last 168 hours. BNP: Invalid input(s): POCBNP CBG: Recent Labs  Lab 10/04/19 1529 10/04/19 2047 10/05/19 0015 10/05/19 0419 10/05/19 0740  GLUCAP 134* 153* 128* 139* 112*   D-Dimer No results for input(s): DDIMER in the last 72 hours. Hgb A1c No results for input(s): HGBA1C in the last 72 hours. Lipid Profile No results for input(s): CHOL, HDL, LDLCALC, TRIG, CHOLHDL, LDLDIRECT in the last 72 hours. Thyroid function studies No results for input(s): TSH, T4TOTAL, T3FREE, THYROIDAB in the last 72 hours.  Invalid input(s): FREET3 Anemia work up No results for input(s): VITAMINB12, FOLATE, FERRITIN, TIBC, IRON, RETICCTPCT in the last 72 hours. Microbiology Recent Results (from the past 240 hour(s))  Respiratory Panel by RT PCR (Flu A&B, Covid) - Nasopharyngeal Swab     Status: None   Collection Time: 09/30/19  4:13 AM   Specimen: Nasopharyngeal Swab  Result Value Ref Range Status   SARS Coronavirus 2 by RT PCR NEGATIVE NEGATIVE Final    Comment: (NOTE) SARS-CoV-2 target nucleic acids are NOT DETECTED. The SARS-CoV-2 RNA is generally detectable in upper respiratoy specimens during the acute phase of infection. The lowest concentration of SARS-CoV-2 viral copies this assay can detect is 131 copies/mL. A negative result does not preclude SARS-Cov-2 infection and should not be used as the sole basis for treatment or other patient management decisions. A negative result may occur with  improper specimen collection/handling, submission of specimen other than nasopharyngeal swab, presence of viral mutation(s) within the areas targeted by this assay, and inadequate number of viral copies (<  131 copies/mL). A negative result must be combined with clinical observations, patient history, and  epidemiological information. The expected result is Negative. Fact Sheet for Patients:  PinkCheek.be Fact Sheet for Healthcare Providers:  GravelBags.it This test is not yet ap proved or cleared by the [place] FDA and  has been authorized for detection and/or diagnosis of SARS-CoV-2 by FDA under an Emergency Use Authorization (EUA). This EUA will remain  in effect (meaning this test can be used) for the duration of the COVID-19 declaration under Section 564(b)(1) of the Act, 21 U.S.C. section 360bbb-3(b)(1), unless the authorization is terminated or revoked sooner.    Influenza A by PCR NEGATIVE NEGATIVE Final   Influenza B by PCR NEGATIVE NEGATIVE Final    Comment: (NOTE) The Xpert Xpress SARS-CoV-2/FLU/RSV assay is intended as an aid in  the diagnosis of influenza from Nasopharyngeal swab specimens and  should not be used as a sole basis for treatment. Nasal washings and  aspirates are unacceptable for Xpert Xpress SARS-CoV-2/FLU/RSV  testing. Fact Sheet for Patients: PinkCheek.be Fact Sheet for Healthcare Providers: GravelBags.it This test is not yet approved or cleared by the [place] FDA and  has been authorized for detection and/or diagnosis of SARS-CoV-2 by  FDA under an Emergency Use Authorization (EUA). This EUA will remain  in effect (meaning this test can be used) for the duration of the  Covid-19 declaration under Section 564(b)(1) of the Act, 21  U.S.C. section 360bbb-3(b)(1), unless the authorization is  terminated or revoked. Performed at [facility], [place] [address], [place], [place] [address]     Please note: You were cared for by a hospitalist during your hospital stay. Once you are discharged, your primary care physician will handle any further medical issues. Please note that NO REFILLS for any discharge medications  will be authorized once you are discharged, as it is imperative that you return to your primary care physician (or establish a relationship with a primary care physician if you do not have one) for your post hospital discharge needs so that they can reassess your need for medications and monitor your lab values.  Signed: Marlowe Aschoff Michalle Rademaker  [facility] 10/05/2019, 9:55 AM

## 2019-10-05 NOTE — Progress Notes (Signed)
Physical Therapy Treatment Patient Details Name: Matthew Zimmerman MRN: [id number] DOB: [birth year] Today's Date: 10/05/2019    History of Present Illness Pt admitted s/p fall 2* syncope caused by blood loss 2* GIB.  Pt states he injured R knee and ankle in fall - imaging indicates no fractures.   Pt s/p colonscopy and IVC filter placement 10/03/19 and with hx of DM    PT Comments    Pt requiring increased time for all tasks but with noted improvement in activity tolerance.  This am, pt able to achieve heel contact and PWB on R LE allowing increased stability and activity tolerance.   Follow Up Recommendations  Home health PT;SNF     Equipment Recommendations  Rolling walker with 5" wheels    Recommendations for Other Services OT consult     Precautions / Restrictions Precautions Precautions: Fall Precaution Comments: Pt tolerating ltd wt on R LE 2* foot/ankle pain Required Braces or Orthoses: Other Brace Other Brace: Knee sleeve in place; no oders regarding ankle Restrictions Weight Bearing Restrictions: No    Mobility  Bed Mobility Overal bed mobility: Needs Assistance Bed Mobility: Supine to Sit     Supine to sit: Supervision     General bed mobility comments: Increased time but no physical assist  Transfers Overall transfer level: Needs assistance Equipment used: Rolling walker (2 wheeled) Transfers: Sit to/from Stand Sit to Stand: Min guard         General transfer comment: cues for use of UEs to self assist.   Ambulation/Gait Ambulation/Gait assistance: Min assist;Min guard Gait Distance (Feet): 55 Feet Assistive device: Rolling walker (2 wheeled) Gait Pattern/deviations: Step-to pattern;Decreased step length - right;Decreased step length - left;Shuffle;Trunk flexed Gait velocity: decr   General Gait Details: Increased time with cues for sequence, posture, position from RW, increased UE WB   Stairs             Wheelchair Mobility     Modified Rankin (Stroke Patients Only)       Balance Overall balance assessment: Needs assistance Sitting-balance support: Feet supported;No upper extremity supported Sitting balance-Leahy Scale: Good     Standing balance support: Bilateral upper extremity supported Standing balance-Leahy Scale: Poor Standing balance comment: Pt reliant on UEs and RW to maintain balance 2* pain limiting ability to WB on R LE                            Cognition Arousal/Alertness: Awake/alert Behavior During Therapy: WFL for tasks assessed/performed Overall Cognitive Status: Within Functional Limits for tasks assessed                                        Exercises      General Comments        Pertinent Vitals/Pain Pain Assessment: 0-10 Pain Score: 4  Pain Location: R foot/ankle Pain Descriptors / Indicators: Grimacing;Guarding Pain Intervention(s): Limited activity within patient's tolerance;Monitored during session;Premedicated before session    Home Living                      Prior Function            PT Goals (current goals can now be found in the care plan section) Acute Rehab PT Goals Patient Stated Goal: Regain IND PT Goal Formulation: With patient Time For Goal Achievement: 10/18/19 Potential to  Achieve Goals: Fair Progress towards PT goals: Progressing toward goals    Frequency    Min 5X/week      PT Plan Current plan remains appropriate    Co-evaluation              AM-PAC PT "6 Clicks" Mobility   Outcome Measure  Help needed turning from your back to your side while in a flat bed without using bedrails?: A Little Help needed moving from lying on your back to sitting on the side of a flat bed without using bedrails?: A Little Help needed moving to and from a bed to a chair (including a wheelchair)?: A Little Help needed standing up from a chair using your arms (e.g., wheelchair or bedside chair)?: A  Little Help needed to walk in hospital room?: A Little Help needed climbing 3-5 steps with a railing? : A Lot 6 Click Score: 17    End of Session Equipment Utilized During Treatment: Gait belt Activity Tolerance: Patient tolerated treatment well Patient left: in chair;with call bell/phone within reach;with nursing/sitter in room Nurse Communication: Mobility status PT Visit Diagnosis: Difficulty in walking, not elsewhere classified (R26.2)     Time: [id number] PT Time Calculation (min) (ACUTE ONLY): 32 min  Charges:  $Gait Training: 23-37 mins                     [place] Pager [phone] Office [phone]    Matthew Zimmerman 10/05/2019, 12:06 PM

## 2019-10-05 NOTE — Progress Notes (Signed)
Subjective: No abdominal pain. No blood in stool.  Objective: Vital signs in last 24 hours: Temp:  [98.6 F (37 C)-99.5 F (37.5 C)] 99.1 F (37.3 C) (01/31 0818) Pulse Rate:  [63-69] 63 (01/31 0818) Resp:  [16-20] 17 (01/31 0818) BP: (127-145)/(46-72) 127/68 (01/31 0818) SpO2:  [96 %-100 %] 99 % (01/31 0818) Weight change:  Last BM Date: 10/03/19  PE: GEN:  NAD  Lab Results: CBC    Component Value Date/Time   WBC 7.4 10/05/2019 0757   RBC 3.41 (L) 10/05/2019 0757   HGB 9.3 (L) 10/05/2019 0757   HCT 28.2 (L) 10/05/2019 0757   PLT 238 10/05/2019 0757   MCV 82.7 10/05/2019 0757   MCV 86.1 10/15/2012 1358   MCH 27.3 10/05/2019 0757   MCHC 33.0 10/05/2019 0757   RDW 16.7 (H) 10/05/2019 0757   LYMPHSABS 1.5 10/05/2019 0757   MONOABS 0.8 10/05/2019 0757   EOSABS 0.6 (H) 10/05/2019 0757   BASOSABS 0.1 10/05/2019 0757   CMP     Component Value Date/Time   NA 138 10/04/2019 0335   K 3.5 10/04/2019 0335   CL 106 10/04/2019 0335   CO2 27 10/04/2019 0335   GLUCOSE 123 (H) 10/04/2019 0335   BUN 6 (L) 10/04/2019 0335   CREATININE 0.88 10/04/2019 0335   CALCIUM 8.1 (L) 10/04/2019 0335   PROT 5.4 (L) 09/30/2019 0410   ALBUMIN 2.9 (L) 09/30/2019 0410   AST 16 09/30/2019 0410   ALT 12 09/30/2019 0410   ALKPHOS 53 09/30/2019 0410   BILITOT 0.6 09/30/2019 0410   GFRNONAA >60 10/04/2019 0335   GFRAA >60 10/04/2019 0335   Assessment:  1. Painless hematochezia. Recurrent, but no bleeding since most recent colonoscopy couple days ago.  Colonoscopy x 2, most recently yesterday with further clipping of transverse colon ulcerated region.  Not sure if ulcerated spot in transverse colon from yesterday was bleeding spot, or just residual finding after APC to that area a couple weeks ago. 2. Acute blood loss anemia, recurrent. 3. Recurrent pulmonary emboli, on chronic rivaroxaban (currently on hold).  Plan:  1.  Advancing diet. 2.  Per notes, patient has no further need for  anticoagulation, so rivaroxaban is on indefinite hold. 3.  OK to discharge home today from GI perspective; patient can follow-up with [facility] as outpatient in 3-4 weeks. 4.  [facility] will sign-off; please call with questions; thank you for the consultation.   Matthew Zimmerman 10/05/2019, 10:30 AM   Cell [phone] If no answer or after 5 PM call [phone]

## 2019-10-05 NOTE — Plan of Care (Signed)

## 2019-10-05 NOTE — Care Management Note (Signed)
Case Management Note  Patient Details  Name: Matthew Zimmerman MRN: [id number] Date of Birth: 23-Jan-1946  Subjective/Objective: 74 yo M admitted with GI bleed.                    Action/Plan: Received referral to assist with DME and HHPT.   Expected Discharge Date:  10/05/19               Expected Discharge Plan:  [place]  In-House Referral:     Discharge planning Services  CM Consult  Post Acute Care Choice:  Durable Medical Equipment, Home Health Choice offered to:  Patient, Spouse  DME Arranged:  Walker rolling DME Agency:  [facility]  HH Arranged:  PT HH Agency:  [facility] ([facility])  Status of Service:     If discussed at [place] of Stay Meetings, dates discussed:    Additional Comments: Met with pt to discuss referral. Pt plans to return home with the support of his wife and grandson. He reports that he has a PCP and he denies any issues with his prescriptions. PT is recommending a RW and HHPT. Discussed preference for a [facility] agency. He reports that he doesn't have a preference. Provided pt with a CMS Medicare.gov Compare Post Acute list of [place] agencies. Pt was on the phone with his wife and he asked me to talk to his wife. Discussed referral with wife. She reports that she doesn't have a preference for a [place] agency. Will contact [place] agencies for referral. Contacted [place] at [facility] for DME referral.  Norina Buzzard, RN 10/05/2019, 12:28 PM

## 2019-10-06 ENCOUNTER — Encounter: Payer: Self-pay | Admitting: *Deleted

## 2019-10-06 ENCOUNTER — Other Ambulatory Visit: Payer: Self-pay | Admitting: *Deleted

## 2019-10-06 LAB — BPAM RBC
Blood Product Expiration Date: 202102152359
ISSUE DATE / TIME: 202101301204
Unit Type and Rh: 6200

## 2019-10-06 LAB — TYPE AND SCREEN
ABO/RH(D): A POS
Antibody Screen: NEGATIVE
Unit division: 0

## 2019-10-06 NOTE — Patient Outreach (Addendum)
[place] [facility]) Care Management Chronic Special Needs Program    10/06/2019  Name: Matthew Zimmerman, DOB: 1945/10/08  MRN: [id number]   [name] is enrolled in a chronic special needs plan for Diabetes. Reviewed and updated care plan.  Client was admitted to [facility] from 1/26-1/31/21 for lower Gi bleed. He was previously hospitalized from 1/14-1/17/21 for the same. He required a total of 6 units of PRBC, a repeat colonoscopy on 10/03/19 that did not show active bleeding and placement t of an inferior vena cava filter on 10/03/19. He was discharged to home on 1/31 with home health services of physical therapy and DME of a rolling walker. He was not to resume Xarelto.   Goals Addressed            This Visit's Progress     Patient Stated   . "do some kind of exercise that doesn't involve standing on my feet" (pt-stated)       Home Health physical therapy was ordered upon client's discharge from the hospital on 10/05/19; to be provided by [facility] home health agency    . "Keep my A1C under 7.0%" (pt-stated)       Reviewed results of most recent Hgb A1C on 09/19/19 of 7.5% while client was hospitalized Hgb A1C was not checked when client was readmitted from 1/26-/10/05/19 form another lower GI bleed    . Remain free of Covid 19 infection for rest of the year (pt-stated)   On track    09/22/19 Client states he has not contracted Covid and per his request discussed the results of his Covid test done 09/18/19 which was negative. He says he will get the Covid vaccine when it is available. Advised him to listen to the news regarding area vaccine clinics and that this RNCM will notify him of any vaccination opportunities and when appointments are available.  09/24/19 With client's permission, placed him on Covid Vaccine waiting list. 10/06/19 Per  hospital discharge summary, there is no indication that client has Covid and Covid test done 09/30/19 was negative        Other   . Client will verbalize knowledge of self management of Hypertension as evidences by BP reading of 140/90 or less; or as defined by provider       Client  states he does not monitor his blood pressure at home but says he does takes his medicine as directed.  Review of his blood pressure readings in KPN (Knowledge Performance Now- point of care tool) over the last year, demonstrate his readings are meeting target Review of chart during hospitalization from 1/26-1/31/21 demonstrates the majority of client's blood pressure readings were meeting target of <140/<90    . General - Client will not be readmitted within 30 days (C-SNP)   Not on track    10/06/19  Client was readmitted for another lower GI bleed from 1/26-1/31/21. (He was previously hospitalized from 1/14-1/17/21 for lower GI bleed.) During the 1/26 admission, the client required 6 units of PRBC, repeat colonoscopy on 1/29 and insertion of IVC filter on 10/03/19. Client was told to stop Xarelto, follow up with primary care provider and gastroenterologist as an outpatient.  Client to be assigned [place] Discharge calls Client instructed to follow discharge instructions including taking medications as prescribed,call provider for problems, call 24 hour Nurse Advice Line as needed at [phone]       Per transition of care process client will received [place] Discharge calls.  Plan: Send outreach letter with updated care plan to client. Send updated car plan to provider. This RNCM will outreach to client in one month to review plan of care, assess clinical status and provide care management assistance as needed.   Kelli Churn RN, CCM, Cable Management Coordinator [facility] Care Management [phone]

## 2019-10-09 ENCOUNTER — Other Ambulatory Visit: Payer: Self-pay | Admitting: Pharmacist

## 2019-10-09 NOTE — Patient Outreach (Signed)
[place] [facility]) Care Management  [place]   10/09/2019  Domnic Braswell 1946/02/18 [id number]  Reason for referral: Medication Management  Referral source: [employer] C-SNP Care Manager with [facility] Current insurance: [employer] C-SNP  PMHx includes but not limited to:     Outreach:  Successful telephone call with patient and .  HIPAA identifiers verified.   Subjective:  Patient is a 74 year old male with multiple medical conditions including but not limited to:  Type 2 diabetes, hypertension, GERD, history of GI bleed, history of Pulmonary embolism, and chronic back pain. Patient was recently discharged for his second admission in the last month for GI bleed.  Objective: The ASCVD Risk score Mikey Bussing DC Jr., et al., 2013) failed to calculate for the following reasons:   Cannot find a previous HDL lab   Cannot find a previous total cholesterol lab  Lab Results  Component Value Date   CREATININE 0.88 10/04/2019   CREATININE 1.00 10/03/2019   CREATININE 1.16 10/02/2019    Lab Results  Component Value Date   HGBA1C 7.5 (H) 09/19/2019    Lipid Panel  No results found for: CHOL, TRIG, HDL, CHOLHDL, VLDL, LDLCALC, LDLDIRECT  BP Readings from Last 3 Encounters:  10/05/19 134/73  09/21/19 120/71  09/08/19 (!) 128/54    Allergies  Allergen Reactions  . Cortisone Other (See Comments)    CBG thrown out of control    Medications Reviewed Today    Reviewed by Cleda Daub, RN (Registered Nurse) on 10/03/19 at [phone]  Med List Status: Complete  Medication Order Taking? Sig Documenting Provider Last Dose Status Informant  aspirin EC 81 MG tablet [id number] Yes Take 81 mg by mouth daily. [provider] 09/29/2019 2000 Active Self  atorvastatin (LIPITOR) 20 MG tablet [id number] Yes Take 10 mg by mouth daily.  [provider] 09/29/2019 pm Active Self  augmented betamethasone dipropionate (DIPROLENE-AF) 0.05 % cream  [id number] Yes Apply 1 application topically daily as needed (itchy skin near bottom).  [provider] Past Month Unknown time Active Self  BD INSULIN SYRINGE U/F 31G X 5/16" 0.5 ML MISC [id number]  2 (two) times daily. [provider]  Active Self  clotrimazole-betamethasone (LOTRISONE) cream [id number] Yes Apply 1 application topically 2 (two) times daily.  [provider] Past Month Unknown time Active Self  dapagliflozin propanediol (FARXIGA) 5 MG TABS tablet [id number] Yes Take 5 mg by mouth daily. Jani Gravel, MD 09/29/2019 Unknown time Active Self  diclofenac sodium (VOLTAREN) 1 % GEL [id number] Yes Apply 1 application topically 4 (four) times daily as needed (pain). [provider] Past Month Unknown time Active Self  glucose blood (ONE TOUCH ULTRA TEST) test strip [id number]  Use to test blood sugar twice a day [provider]  Active Self  insulin NPH-regular Human (NOVOLIN 70/30) (70-30) 100 UNIT/ML injection [id number] Yes Inject 25-35 Units into the skin 2 (two) times daily with a meal. Humulin [provider] 09/29/2019 30 units  Active Self           Med Note Broadus John, Trude Mcburney   Mon Sep 22, 2019 11:41 AM) States he uses a sliding scale provided by Dr Maudie Mercury  Lancets (ONETOUCH DELICA PLUS [id number]) [place] [id number]  Use to test blood sugar twice daily [provider]  Active Self  metFORMIN (GLUCOPHAGE) 500 MG tablet [id number] Yes Take 1,000 mg by mouth 2 (two) times daily with a meal.  [provider] 09/29/2019 1900 Active Self  metoprolol succinate (TOPROL-XL) 25 MG 24 hr tablet [id number] Yes Take 1 tablet (25 mg total) by mouth daily. Take with or immediately following a meal. Take at night.  Patient taking differently: Take 25 mg by mouth daily. Take with or immediately following a meal.   Miquel Dunn, NP 09/29/2019 0730 Expired 09/30/19 2359 Self  rivaroxaban (XARELTO) 20 MG TABS tablet [id number] Yes Take 20 mg by  mouth daily.  [provider] 09/29/2019 0730 Active Self           Med Note Maricela Bo, CRYSTAL D   Tue Sep 30, 2019  4:42 AM)    vitamin B-12 (CYANOCOBALAMIN) 100 MCG tablet [id number] Yes Take 100 mcg by mouth daily. [provider] 09/29/2019 Unknown time Active Self  Vitamin D, Ergocalciferol, (DRISDOL) 1.25 MG (50000 UNIT) CAPS capsule [id number] Yes Take 50,000 Units by mouth every 7 (seven) days. [provider] 09/29/2019 Unknown time Active Self          Assessment: Drugs sorted by system:  ASSESSMENT: Date Discharged from Hospital: 10/05/2019 Date Medication Reconciliation Performed: 10/09/2019  Medications:  New at Discharge: . none  Adjustments at Discharge: . none  Discontinued at Discharge:   [place]  Patient was recently discharged from hospital and all medications have been reviewed.  Cardiovascular:  Aspirin, Atorvastatin, Metoprolol Succinate,   Endocrine: Farxiga, Novolin 70/30, metformin  Topical: Augmented betamethasone dipropionate, Clotrimazole-betamethasone, Diclofenac sodium gel  Vitamins/Minerals/Supplements: Cyanocobalamin, Ergocalciferol,     Medication Review Findings:  . Xarelto was called in by the patient's PCP and filled today at his local pharmacy. o Dr. Julianne Rice office was closed as the patient has not been to see Dr. Maudie Mercury since discharge. The discharge summary stated the patient was supposed to discontinue Xarelto. - Patient's wife was instructed not to pick up the Xarelto until we could straighten everything out.   Adherence Patient's wife reported he was not injecting himself with insulin because the discharge summary did not have any checks on it in the time of day slot like the other medications. It was explained that the checks were probably not there because the patient's dose was listed as a range and appeared to be free-hand typed. Patient's wife said she wanted to find out from Dr. Maudie Mercury if he was still  supposed to take insulin.  --A message was left on Dr. Julianne Rice voicemail about the insulin and Xarelto.   Patient has full Extra Help.  Plan: Follow up tomorrow. Elayne Guerin, PharmD, [place] Clinical Pharmacist [phone]

## 2019-10-10 ENCOUNTER — Other Ambulatory Visit: Payer: Self-pay | Admitting: Pharmacist

## 2019-10-10 NOTE — Patient Outreach (Signed)
[place] [facility]) Care Management  10/10/2019  Matthew Zimmerman [birth year] [id number]   Patient was called to follow up on Xarelto. HIPAA identifiers were obtained. Spoke with patient's wife. She confirmed that Dr. Maudie Mercury called them last night and confirmed patient is not to take Xarelto but SHOULD be injecting himself with insulin.  He will restart with a lower dose of 5 units BID and titrate upwards.   Called [facility] and had them put the Xarelto prescription they filled for the patient back in stock since it was discontinued.  Plan: Follow up with patient in 1 week. Update CSNP Nurse.  Elayne Guerin, PharmD, [place] Clinical Pharmacist [phone]

## 2019-10-13 ENCOUNTER — Encounter (HOSPITAL_COMMUNITY): Payer: Self-pay

## 2019-10-13 ENCOUNTER — Inpatient Hospital Stay (HOSPITAL_COMMUNITY)
Admission: EM | Admit: 2019-10-13 | Discharge: 2019-10-17 | DRG: 871 | Disposition: A | Discharge status: Home Health | Payer: HMO | Attending: Internal Medicine | Admitting: Internal Medicine

## 2019-10-13 ENCOUNTER — Emergency Department (HOSPITAL_COMMUNITY): Payer: HMO

## 2019-10-13 ENCOUNTER — Inpatient Hospital Stay (HOSPITAL_COMMUNITY): Payer: HMO

## 2019-10-13 ENCOUNTER — Other Ambulatory Visit: Payer: Self-pay

## 2019-10-13 DIAGNOSIS — N179 Acute kidney failure, unspecified: Secondary | ICD-10-CM

## 2019-10-13 DIAGNOSIS — Z20822 Contact with and (suspected) exposure to covid-19: Secondary | ICD-10-CM | POA: Diagnosis not present

## 2019-10-13 DIAGNOSIS — I82463 Acute embolism and thrombosis of calf muscular vein, bilateral: Secondary | ICD-10-CM | POA: Diagnosis present

## 2019-10-13 DIAGNOSIS — A419 Sepsis, unspecified organism: Principal | ICD-10-CM | POA: Diagnosis present

## 2019-10-13 DIAGNOSIS — R109 Unspecified abdominal pain: Secondary | ICD-10-CM | POA: Diagnosis not present

## 2019-10-13 DIAGNOSIS — R0602 Shortness of breath: Secondary | ICD-10-CM | POA: Diagnosis not present

## 2019-10-13 DIAGNOSIS — E860 Dehydration: Secondary | ICD-10-CM | POA: Diagnosis present

## 2019-10-13 DIAGNOSIS — I82413 Acute embolism and thrombosis of femoral vein, bilateral: Secondary | ICD-10-CM | POA: Diagnosis present

## 2019-10-13 DIAGNOSIS — K625 Hemorrhage of anus and rectum: Secondary | ICD-10-CM | POA: Diagnosis not present

## 2019-10-13 DIAGNOSIS — Z86711 Personal history of pulmonary embolism: Secondary | ICD-10-CM | POA: Diagnosis not present

## 2019-10-13 DIAGNOSIS — K921 Melena: Secondary | ICD-10-CM | POA: Diagnosis not present

## 2019-10-13 DIAGNOSIS — I82423 Acute embolism and thrombosis of iliac vein, bilateral: Secondary | ICD-10-CM | POA: Diagnosis present

## 2019-10-13 DIAGNOSIS — E7800 Pure hypercholesterolemia, unspecified: Secondary | ICD-10-CM | POA: Diagnosis present

## 2019-10-13 DIAGNOSIS — I82403 Acute embolism and thrombosis of unspecified deep veins of lower extremity, bilateral: Secondary | ICD-10-CM

## 2019-10-13 DIAGNOSIS — D509 Iron deficiency anemia, unspecified: Secondary | ICD-10-CM

## 2019-10-13 DIAGNOSIS — I82433 Acute embolism and thrombosis of popliteal vein, bilateral: Secondary | ICD-10-CM | POA: Diagnosis present

## 2019-10-13 DIAGNOSIS — R1110 Vomiting, unspecified: Secondary | ICD-10-CM

## 2019-10-13 DIAGNOSIS — M25461 Effusion, right knee: Secondary | ICD-10-CM | POA: Diagnosis not present

## 2019-10-13 DIAGNOSIS — E785 Hyperlipidemia, unspecified: Secondary | ICD-10-CM | POA: Diagnosis present

## 2019-10-13 DIAGNOSIS — Z794 Long term (current) use of insulin: Secondary | ICD-10-CM

## 2019-10-13 DIAGNOSIS — R52 Pain, unspecified: Secondary | ICD-10-CM

## 2019-10-13 DIAGNOSIS — Z86718 Personal history of other venous thrombosis and embolism: Secondary | ICD-10-CM

## 2019-10-13 DIAGNOSIS — R531 Weakness: Secondary | ICD-10-CM | POA: Diagnosis not present

## 2019-10-13 DIAGNOSIS — K920 Hematemesis: Secondary | ICD-10-CM | POA: Diagnosis not present

## 2019-10-13 DIAGNOSIS — I82441 Acute embolism and thrombosis of right tibial vein: Secondary | ICD-10-CM | POA: Diagnosis not present

## 2019-10-13 DIAGNOSIS — I10 Essential (primary) hypertension: Secondary | ICD-10-CM | POA: Diagnosis not present

## 2019-10-13 DIAGNOSIS — G9341 Metabolic encephalopathy: Secondary | ICD-10-CM

## 2019-10-13 DIAGNOSIS — J9601 Acute respiratory failure with hypoxia: Secondary | ICD-10-CM | POA: Diagnosis not present

## 2019-10-13 DIAGNOSIS — R000 Tachycardia, unspecified: Secondary | ICD-10-CM | POA: Diagnosis not present

## 2019-10-13 DIAGNOSIS — E872 Acidosis, unspecified: Secondary | ICD-10-CM

## 2019-10-13 DIAGNOSIS — Z833 Family history of diabetes mellitus: Secondary | ICD-10-CM

## 2019-10-13 DIAGNOSIS — I82451 Acute embolism and thrombosis of right peroneal vein: Secondary | ICD-10-CM | POA: Diagnosis present

## 2019-10-13 DIAGNOSIS — Z87891 Personal history of nicotine dependence: Secondary | ICD-10-CM | POA: Diagnosis not present

## 2019-10-13 DIAGNOSIS — E119 Type 2 diabetes mellitus without complications: Secondary | ICD-10-CM | POA: Diagnosis not present

## 2019-10-13 DIAGNOSIS — D649 Anemia, unspecified: Secondary | ICD-10-CM | POA: Diagnosis not present

## 2019-10-13 DIAGNOSIS — Z7982 Long term (current) use of aspirin: Secondary | ICD-10-CM | POA: Diagnosis not present

## 2019-10-13 DIAGNOSIS — K219 Gastro-esophageal reflux disease without esophagitis: Secondary | ICD-10-CM | POA: Diagnosis present

## 2019-10-13 DIAGNOSIS — G9340 Encephalopathy, unspecified: Secondary | ICD-10-CM | POA: Diagnosis not present

## 2019-10-13 DIAGNOSIS — Z95828 Presence of other vascular implants and grafts: Secondary | ICD-10-CM

## 2019-10-13 DIAGNOSIS — R6520 Severe sepsis without septic shock: Secondary | ICD-10-CM | POA: Diagnosis present

## 2019-10-13 DIAGNOSIS — I82220 Acute embolism and thrombosis of inferior vena cava: Secondary | ICD-10-CM | POA: Diagnosis not present

## 2019-10-13 DIAGNOSIS — E1165 Type 2 diabetes mellitus with hyperglycemia: Secondary | ICD-10-CM | POA: Diagnosis present

## 2019-10-13 DIAGNOSIS — I82409 Acute embolism and thrombosis of unspecified deep veins of unspecified lower extremity: Secondary | ICD-10-CM | POA: Diagnosis not present

## 2019-10-13 DIAGNOSIS — I2699 Other pulmonary embolism without acute cor pulmonale: Secondary | ICD-10-CM | POA: Diagnosis not present

## 2019-10-13 DIAGNOSIS — R112 Nausea with vomiting, unspecified: Secondary | ICD-10-CM | POA: Diagnosis not present

## 2019-10-13 LAB — CBC WITH DIFFERENTIAL/PLATELET
Abs Immature Granulocytes: 0.61 10*3/uL — ABNORMAL HIGH (ref 0.00–0.07)
Basophils Absolute: 0.1 10*3/uL (ref 0.0–0.1)
Basophils Relative: 1 %
Eosinophils Absolute: 0.1 10*3/uL (ref 0.0–0.5)
Eosinophils Relative: 1 %
HCT: 32.6 % — ABNORMAL LOW (ref 39.0–52.0)
Hemoglobin: 9.8 g/dL — ABNORMAL LOW (ref 13.0–17.0)
Immature Granulocytes: 3 %
Lymphocytes Relative: 21 %
Lymphs Abs: 3.9 10*3/uL (ref 0.7–4.0)
MCH: 26.9 pg (ref 26.0–34.0)
MCHC: 30.1 g/dL (ref 30.0–36.0)
MCV: 89.6 fL (ref 80.0–100.0)
Monocytes Absolute: 1.3 10*3/uL — ABNORMAL HIGH (ref 0.1–1.0)
Monocytes Relative: 7 %
Neutro Abs: 12.8 10*3/uL — ABNORMAL HIGH (ref 1.7–7.7)
Neutrophils Relative %: 67 %
Platelets: 233 10*3/uL (ref 150–400)
RBC: 3.64 MIL/uL — ABNORMAL LOW (ref 4.22–5.81)
RDW: 17.7 % — ABNORMAL HIGH (ref 11.5–15.5)
WBC: 18.8 10*3/uL — ABNORMAL HIGH (ref 4.0–10.5)
nRBC: 0.3 % — ABNORMAL HIGH (ref 0.0–0.2)

## 2019-10-13 LAB — COMPREHENSIVE METABOLIC PANEL
ALT: 15 U/L (ref 0–44)
AST: 23 U/L (ref 15–41)
Albumin: 4 g/dL (ref 3.5–5.0)
Alkaline Phosphatase: 78 U/L (ref 38–126)
Anion gap: 23 — ABNORMAL HIGH (ref 5–15)
BUN: 52 mg/dL — ABNORMAL HIGH (ref 8–23)
CO2: 13 mmol/L — ABNORMAL LOW (ref 22–32)
Calcium: 9.3 mg/dL (ref 8.9–10.3)
Chloride: 100 mmol/L (ref 98–111)
Creatinine, Ser: 3.56 mg/dL — ABNORMAL HIGH (ref 0.61–1.24)
GFR calc Af Amer: 18 mL/min — ABNORMAL LOW (ref >60)
GFR calc non Af Amer: 16 mL/min — ABNORMAL LOW (ref >60)
Glucose, Bld: 314 mg/dL — ABNORMAL HIGH (ref 70–99)
Potassium: 4.1 mmol/L (ref 3.5–5.1)
Sodium: 136 mmol/L (ref 135–145)
Total Bilirubin: 1.3 mg/dL — ABNORMAL HIGH (ref 0.3–1.2)
Total Protein: 8.4 g/dL — ABNORMAL HIGH (ref 6.5–8.1)

## 2019-10-13 LAB — LACTIC ACID, PLASMA
Lactic Acid, Venous: 9.3 mmol/L (ref 0.5–1.9)
Lactic Acid, Venous: 6.1 mmol/L (ref 0.5–1.9)

## 2019-10-13 LAB — PROTIME-INR
INR: 1.3 — ABNORMAL HIGH (ref 0.8–1.2)
Prothrombin Time: 16.4 seconds — ABNORMAL HIGH (ref 11.4–15.2)

## 2019-10-13 LAB — RESPIRATORY PANEL BY RT PCR (FLU A&B, COVID)
Influenza A by PCR: NEGATIVE
Influenza B by PCR: NEGATIVE
SARS Coronavirus 2 by RT PCR: NEGATIVE

## 2019-10-13 LAB — CBG MONITORING, ED
Glucose-Capillary: 241 mg/dL — ABNORMAL HIGH (ref 70–99)
Glucose-Capillary: 155 mg/dL — ABNORMAL HIGH (ref 70–99)

## 2019-10-13 LAB — ETHANOL: Alcohol, Ethyl (B): <10 mg/dL (ref <10)

## 2019-10-13 LAB — TSH: TSH: 6.373 u[IU]/mL — ABNORMAL HIGH (ref 0.350–4.500)

## 2019-10-13 LAB — AMMONIA: Ammonia: 23 umol/L (ref 9–35)

## 2019-10-13 LAB — ACETAMINOPHEN LEVEL: Acetaminophen (Tylenol), Serum: <10 ug/mL — ABNORMAL LOW (ref 10–30)

## 2019-10-13 LAB — SALICYLATE LEVEL: Salicylate Lvl: <7 mg/dL — ABNORMAL LOW (ref 7.0–30.0)

## 2019-10-13 MED ORDER — SODIUM CHLORIDE 0.9 % IV SOLN: Freq: Once | INTRAVENOUS | Status: AC

## 2019-10-13 MED ORDER — VITAMIN B-12 100 MCG PO TABS
100.0000 ug | ORAL_TABLET | Freq: Every day | ORAL | Status: DC
  Administered 2019-10-14 – 2019-10-17 (×4): 100 ug via ORAL
  Filled 2019-10-13 (×4): qty 1

## 2019-10-13 MED ORDER — MORPHINE SULFATE (PF) 2 MG/ML IV SOLN
1.0000 mg | INTRAVENOUS | Status: DC | PRN
  Administered 2019-10-14 – 2019-10-16 (×8): 1 mg via INTRAVENOUS
  Filled 2019-10-13 (×8): qty 1

## 2019-10-13 MED ORDER — ACETAMINOPHEN 325 MG PO TABS
650.0000 mg | ORAL_TABLET | Freq: Four times a day (QID) | ORAL | Status: DC | PRN
  Administered 2019-10-17: 650 mg via ORAL
  Filled 2019-10-13: qty 2

## 2019-10-13 MED ORDER — VANCOMYCIN HCL 1750 MG/350ML IV SOLN
1750.0000 mg | Freq: Once | INTRAVENOUS | Status: AC
  Administered 2019-10-13: 1750 mg via INTRAVENOUS
  Filled 2019-10-13: qty 350

## 2019-10-13 MED ORDER — SODIUM CHLORIDE 0.9 % IV BOLUS (SEPSIS)
1000.0000 mL | Freq: Once | INTRAVENOUS | Status: AC
  Administered 2019-10-13: 1000 mL via INTRAVENOUS

## 2019-10-13 MED ORDER — CHLORHEXIDINE GLUCONATE CLOTH 2 % EX PADS
6.0000 | MEDICATED_PAD | Freq: Every day | CUTANEOUS | Status: DC
  Administered 2019-10-15 – 2019-10-17 (×4): 6 via TOPICAL

## 2019-10-13 MED ORDER — SODIUM CHLORIDE 0.9 % IV SOLN: INTRAVENOUS | Status: DC

## 2019-10-13 MED ORDER — INSULIN ASPART 100 UNIT/ML ~~LOC~~ SOLN
0.0000 [IU] | Freq: Every day | SUBCUTANEOUS | Status: DC
  Administered 2019-10-15 – 2019-10-16 (×2): 2 [IU] via SUBCUTANEOUS
  Filled 2019-10-13: qty 0.05

## 2019-10-13 MED ORDER — SODIUM CHLORIDE 0.9 % IV SOLN
2.0000 g | INTRAVENOUS | Status: DC
  Administered 2019-10-14: 2 g via INTRAVENOUS
  Filled 2019-10-13: qty 2

## 2019-10-13 MED ORDER — SODIUM CHLORIDE 0.9 % IV SOLN
2.0000 g | Freq: Once | INTRAVENOUS | Status: AC
  Administered 2019-10-13: 2 g via INTRAVENOUS
  Filled 2019-10-13: qty 2

## 2019-10-13 MED ORDER — SODIUM CHLORIDE 0.9 % IV BOLUS (SEPSIS)
500.0000 mL | Freq: Once | INTRAVENOUS | Status: AC
  Administered 2019-10-13 (×2): 500 mL via INTRAVENOUS

## 2019-10-13 MED ORDER — METRONIDAZOLE IN NACL 5-0.79 MG/ML-% IV SOLN
500.0000 mg | Freq: Three times a day (TID) | INTRAVENOUS | Status: DC
  Administered 2019-10-13 – 2019-10-16 (×9): 500 mg via INTRAVENOUS
  Filled 2019-10-13 (×9): qty 100

## 2019-10-13 MED ORDER — METRONIDAZOLE IN NACL 5-0.79 MG/ML-% IV SOLN
500.0000 mg | Freq: Once | INTRAVENOUS | Status: AC
  Administered 2019-10-13: 500 mg via INTRAVENOUS
  Filled 2019-10-13: qty 100

## 2019-10-13 MED ORDER — ATORVASTATIN CALCIUM 10 MG PO TABS
10.0000 mg | ORAL_TABLET | Freq: Every evening | ORAL | Status: DC
  Administered 2019-10-13 – 2019-10-16 (×4): 10 mg via ORAL
  Filled 2019-10-13 (×5): qty 1

## 2019-10-13 MED ORDER — SODIUM CHLORIDE 0.9 % IV BOLUS
500.0000 mL | Freq: Once | INTRAVENOUS | Status: AC
  Administered 2019-10-13: 500 mL via INTRAVENOUS

## 2019-10-13 MED ORDER — INSULIN ASPART 100 UNIT/ML ~~LOC~~ SOLN
0.0000 [IU] | Freq: Three times a day (TID) | SUBCUTANEOUS | Status: DC
  Administered 2019-10-14 (×2): 2 [IU] via SUBCUTANEOUS
  Administered 2019-10-15 (×3): 3 [IU] via SUBCUTANEOUS
  Administered 2019-10-16: 8 [IU] via SUBCUTANEOUS
  Administered 2019-10-16 – 2019-10-17 (×4): 5 [IU] via SUBCUTANEOUS
  Filled 2019-10-13: qty 0.15

## 2019-10-13 MED ORDER — SODIUM BICARBONATE 8.4 % IV SOLN
50.0000 meq | Freq: Once | INTRAVENOUS | Status: AC
  Administered 2019-10-13: 50 meq via INTRAVENOUS
  Filled 2019-10-13: qty 50

## 2019-10-13 MED ORDER — ONDANSETRON HCL 4 MG/2ML IJ SOLN: 4.0000 mg | Freq: Four times a day (QID) | INTRAMUSCULAR | Status: DC | PRN

## 2019-10-13 MED ORDER — VANCOMYCIN HCL IN DEXTROSE 1-5 GM/200ML-% IV SOLN: 1000.0000 mg | Freq: Once | INTRAVENOUS | Status: DC

## 2019-10-13 MED ORDER — ACETAMINOPHEN 650 MG RE SUPP: 650.0000 mg | Freq: Four times a day (QID) | RECTAL | Status: DC | PRN

## 2019-10-13 NOTE — ED Notes (Signed)
Ultrasound at bedside to perform exam prior to transport to floor.

## 2019-10-13 NOTE — ED Triage Notes (Signed)
Patient was brought in by his son. Patient's son reports that the patient was recently discharged, but is having increased weakness and low oxygen levels.

## 2019-10-13 NOTE — H&P (Signed)
History and Physical    Haim Kilpela [id number] DOB: [birth year] DOA: 10/13/2019  PCP: Jani Gravel, MD Patient coming from: Home  Chief Complaint: Shortness of breath, generalized weakness  HPI: Matthew Zimmerman is a 74 y.o. male with medical history significant of insulin-dependent diabetes mellitus, hypertension, hyperlipidemia, PE status post IVC filter not on anticoagulation due to history of GI bleed, GERD, recent hospital admissions for acute blood loss anemia secondary to GI bleed from colonic AVMs status post blood transfusions presenting to the ED for evaluation of generalized weakness and shortness of breath.  Patient states he has not been feeling well for the past 3 days.  He is sneezing.  Also having shortness of breath.  He is having some generalized abdominal discomfort.  Reports having nonbloody emesis for the past few days.  His stool is dark in color.  No diarrhea.  Denies fevers, chills, cough, or chest pain.  No additional history could be obtained from the patient.  ED Course: Temperature as low as 96 F rectal.  Tachycardic and tachypneic on arrival.  Blood pressure low on arrival.  Not hypoxic.  Labs showing WBC count 18.8.  Lactic acid 9.3.  Hemoglobin 9.8, stable since recent hospital discharge.  Bicarb 13, anion gap 23.  Blood glucose 314.  BUN 52, creatinine 3.5.  Baseline creatinine 0.8.  Blood ethanol level <10.  Acetaminophen level <10.  Salicylate level <7.  Ammonia level normal.  TSH 6.373.  Blood culture x2 pending.  UA and urine culture pending.  SARS-CoV-2 PCR test negative.  Influenza panel negative.  Chest x-ray showing no acute cardiopulmonary abnormality.  Head CT negative for acute intracranial abnormality.  CT abdomen pelvis showing increased attenuation and distention of venous structures inferior to the IVC filter, likely representing acute thrombus.  No other acute intra-abdominal abnormality.  Patient received vancomycin, cefepime, metronidazole, and 3  L normal saline boluses.  Review of Systems:  All systems reviewed and apart from history of presenting illness, are negative.  Past Medical History:  Diagnosis Date  . Carpal tunnel syndrome on both sides   . Chest pain   . Diabetes mellitus without complication ([place])   . GERD (gastroesophageal reflux disease)   . Headache   . Hypercholesteremia   . Hyperlipidemia   . PE (pulmonary embolism)   . Shortness of breath dyspnea    PE     Past Surgical History:  Procedure Laterality Date  . BILATERAL CARPAL TUNNEL RELEASE Bilateral   . COLONOSCOPY    . COLONOSCOPY WITH PROPOFOL Left 09/20/2019   Procedure: COLONOSCOPY WITH PROPOFOL;  Surgeon: Ronnette Juniper, MD;  Location: [facility];  Service: Gastroenterology;  Laterality: Left;  . COLONOSCOPY WITH PROPOFOL N/A 10/03/2019   Procedure: COLONOSCOPY WITH PROPOFOL;  Surgeon: Wilford Corner, MD;  Location: [facility];  Service: Endoscopy;  Laterality: N/A;  . ELBOW SURGERY     BONE SPURS REMOVED B ELBOWS  . EYE SURGERY Bilateral    cataracts  . FOOT SURGERY     HEEL SPURS REMOVED B HEELS  . HEMOSTASIS CLIP PLACEMENT  09/20/2019   Procedure: HEMOSTASIS CLIP PLACEMENT;  Surgeon: Ronnette Juniper, MD;  Location: [facility];  Service: Gastroenterology;;  . HEMOSTASIS CLIP PLACEMENT  10/03/2019   Procedure: HEMOSTASIS CLIP PLACEMENT;  Surgeon: Wilford Corner, MD;  Location: [facility];  Service: Endoscopy;;  . HOT HEMOSTASIS N/A 09/20/2019   Procedure: HOT HEMOSTASIS (ARGON PLASMA COAGULATION/BICAP);  Surgeon: Ronnette Juniper, MD;  Location: [facility];  Service: Gastroenterology;  Laterality: N/A;  .  I & D EXTREMITY Right 09/18/2014   Procedure: IRRIGATION AND DEBRIDEMENT RIGHT THUMB, open fracture repair;  Surgeon: Linna Hoff, MD;  Location: [place];  Service: Orthopedics;  Laterality: Right;  . IR IVC FILTER PLMT / S&I [facility] Keels GUID/MOD SED  10/03/2019  . LEG SURGERY Left    left thigh table saw accident, "cut to the bone", I&D/suturing    . SHOULDER SURGERY Left    Bone spur  . SPINE SURGERY  09/04/1985   lumbar spine  . SUBMUCOSAL INJECTION  10/03/2019   Procedure: SUBMUCOSAL INJECTION;  Surgeon: Wilford Corner, MD;  Location: [facility];  Service: Endoscopy;;  . TOE SURGERY       reports that he quit smoking about 57 years ago. His smoking use included cigarettes. He smoked 0.50 packs per day. He has quit using smokeless tobacco. He reports that he does not drink alcohol or use drugs.  Allergies  Allergen Reactions  . Cortisone Other (See Comments)    CBG thrown out of control    Family History  Problem Relation Age of Onset  . Cancer Mother   . Diabetes Father     Prior to Admission medications   Medication Sig Start Date End Date Taking? Authorizing Provider  aspirin EC 81 MG tablet Take 81 mg by mouth every evening.    Yes [provider]  atorvastatin (LIPITOR) 20 MG tablet Take 10 mg by mouth every evening.    Yes [provider]  augmented betamethasone dipropionate (DIPROLENE-AF) 0.05 % cream Apply 1 application topically daily as needed (itchy skin near bottom).  10/28/18  Yes [provider]  clotrimazole-betamethasone (LOTRISONE) cream Apply 1 application topically 2 (two) times daily as needed (itching).  07/11/19  Yes [provider]  dapagliflozin propanediol (FARXIGA) 5 MG TABS tablet Take 5 mg by mouth daily.   Yes Jani Gravel, MD  diclofenac sodium (VOLTAREN) 1 % GEL Apply 1 application topically 4 (four) times daily as needed (pain).   Yes [provider]  insulin NPH-regular Human (NOVOLIN 70/30) (70-30) 100 UNIT/ML injection Inject 25-35 Units into the skin 2 (two) times daily with a meal. Humulin   Yes [provider]  metFORMIN (GLUCOPHAGE) 500 MG tablet Take 1,000 mg by mouth 2 (two) times daily with a meal.  01/30/19  Yes [provider]  metoprolol succinate (TOPROL-XL) 25 MG 24 hr tablet Take 25 mg by mouth daily.   Yes [provider]  vitamin B-12 (CYANOCOBALAMIN) 100 MCG tablet Take 100 mcg by mouth daily.   Yes [provider]  Vitamin D, Ergocalciferol, (DRISDOL) 1.25 MG (50000 UNIT) CAPS capsule Take 50,000 Units by mouth every 7 (seven) days.   Yes [provider]  BD INSULIN SYRINGE U/F 31G X 5/16" 0.5 ML MISC 2 (two) times daily. 07/30/19   [provider]  glucose blood (ONE TOUCH ULTRA TEST) test strip Use to test blood sugar twice a day 12/12/18   [provider]  Lancets [facility] DELICA PLUS [id number]) [place] Use to test blood sugar twice daily 12/12/18   [provider]  metoprolol succinate (TOPROL-XL) 25 MG 24 hr tablet Take 1 tablet (25 mg total) by mouth daily. Take with or immediately following a meal. Take at night. Patient taking differently: Take 25 mg by mouth daily. Take with or immediately following a meal. 06/17/19 09/30/19  Miquel Dunn, NP    Physical Exam: Vitals:   10/13/19 2100 10/13/19 2130 10/13/19 2200 10/13/19 2226  BP: (!) 145/77 124/63 119/77   Pulse: 76 77 78   Resp: (!) 22 (!) 23 (!) 23   Temp:    98.8 F (37.1 C)  TempSrc:    Oral  SpO2: 100% 100% 100%   Weight:      Height:        Physical Exam  Constitutional: He is oriented to person, place, and time. He appears well-developed and well-nourished. No distress.  HENT:  Head: Normocephalic.  Very dry mucous membranes  Eyes: Right eye exhibits no discharge. Left eye exhibits no discharge.  Cardiovascular: Normal rate, regular rhythm and intact distal pulses.  Pulmonary/Chest: Effort normal and breath sounds normal. No respiratory distress. He has no wheezes. He has no rales.  Abdominal: Soft. Bowel sounds are normal. He exhibits no distension. There is abdominal tenderness. There is guarding. There is no rebound.  Generalized tenderness with guarding  Musculoskeletal:        General: Edema present.     Cervical back: Neck supple.     Comments: +1 pitting edema  of bilateral lower extremities  Neurological: He is alert and oriented to person, place, and time. No cranial nerve deficit.  No focal motor or sensory deficit  Skin: Skin is warm and dry. He is not diaphoretic.     Labs on Admission: I have personally reviewed following labs and imaging studies  CBC: Recent Labs  Lab 10/13/19 1636  WBC 18.8*  NEUTROABS 12.8*  HGB 9.8*  HCT 32.6*  MCV 89.6  PLT 0000000   Basic Metabolic Panel: Recent Labs  Lab 10/13/19 1636  NA 136  K 4.1  CL 100  CO2 13*  GLUCOSE 314*  BUN 52*  CREATININE 3.56*  CALCIUM 9.3   GFR: Estimated Creatinine Clearance: 19.4 mL/min (A) (by C-G formula based on SCr of 3.56 mg/dL (H)). Liver Function Tests: Recent Labs  Lab 10/13/19 1636  AST 23  ALT 15  ALKPHOS 78  BILITOT 1.3*  PROT 8.4*  ALBUMIN 4.0   No results for input(s): LIPASE, AMYLASE in the last 168 hours. Recent Labs  Lab 10/13/19 1650  AMMONIA 23   Coagulation Profile: Recent Labs  Lab 10/13/19 1636  INR 1.3*   Cardiac Enzymes: No results for input(s): CKTOTAL, CKMB, CKMBINDEX, TROPONINI in the last 168 hours. BNP (last 3 results) No results for input(s): PROBNP in the last 8760 hours. HbA1C: No results for input(s): HGBA1C in the last 72 hours. CBG: Recent Labs  Lab 10/13/19 1628 10/13/19 2222  GLUCAP 241* 155*   Lipid Profile: No results for input(s): CHOL, HDL, LDLCALC, TRIG, CHOLHDL, LDLDIRECT in the last 72 hours. Thyroid Function Tests: Recent Labs    10/13/19 1636  TSH 6.373*   Anemia Panel: No results for input(s): VITAMINB12, FOLATE, FERRITIN, TIBC, IRON, RETICCTPCT in the last 72 hours. Urine analysis:    Component Value Date/Time   COLORURINE AMBER (A) 05/19/2014 1814   APPEARANCEUR CLEAR 05/19/2014 1814   LABSPEC 1.029 05/19/2014 1814   PHURINE 5.0 05/19/2014 1814   GLUCOSEU NEGATIVE 05/19/2014 1814   HGBUR NEGATIVE 05/19/2014 1814   BILIRUBINUR SMALL (A) 05/19/2014 1814   BILIRUBINUR neg  10/15/2012 1358   KETONESUR 15 (A) 05/19/2014 1814   PROTEINUR 100 (A) 05/19/2014 1814   UROBILINOGEN 1.0 05/19/2014 1814   NITRITE NEGATIVE 05/19/2014 1814   LEUKOCYTESUR NEGATIVE 05/19/2014 1814    Radiological Exams on Admission: CT ABDOMEN PELVIS WO CONTRAST  Result Date: 10/13/2019 CLINICAL DATA:  Abdominal pain, fever, rectal bleeding  EXAM: CT ABDOMEN AND PELVIS WITHOUT CONTRAST TECHNIQUE: Multidetector CT imaging of the abdomen and pelvis was performed following the standard protocol without IV contrast. COMPARISON:  01/16/2018 FINDINGS: Lower chest: No acute pleural or parenchymal lung disease. Hepatobiliary: No focal liver abnormality is seen. No gallstones, gallbladder wall thickening, or biliary dilatation. Pancreas: Unremarkable. No pancreatic ductal dilatation or surrounding inflammatory changes. Spleen: Normal in size without focal abnormality. Adrenals/Urinary Tract: No evidence of urinary tract calculi or obstructive uropathy within either kidney. Stable right renal cyst. Bladder is minimally distended which limits evaluation. The adrenals are unremarkable. Stomach/Bowel: No bowel obstruction or ileus. Normal retrocecal appendix. Vascular/Lymphatic: IVC filter is identified. Within the iliac veins and IVC below the level of the filter, there is increased attenuation, venous distension, and mild surrounding fat stranding which may reflect acute thrombus. This would not be unexpected in a patient with a history of DVT who has recently discontinued anticoagulation due to complications. No pathologic adenopathy within the abdomen or pelvis. Reproductive: Mildly enlarged prostate is otherwise unremarkable. Other: No abdominal wall hernia or abnormality. No abdominopelvic ascites. Musculoskeletal: There are no acute or destructive bony lesions. Reconstructed images demonstrate no additional findings. IMPRESSION: 1. IVC filter as above, with increased attenuation and distension of the venous  structures inferior to the filter likely representing acute thrombus. This is not unexpected in a patient with a history of DVT who has recently discontinued anticoagulation due to bleeding complications. 2. No evidence of urinary tract calculi or obstructive uropathy. 3. Otherwise unremarkable unenhanced exam. Electronically Signed   By: Randa Ngo M.D.   On: 10/13/2019 20:13   CT Head Wo Contrast  Result Date: 10/13/2019 CLINICAL DATA:  Encephalopathy. Increased weakness and low oxygen levels. Multiple colonoscopies for rectal bleeding. EXAM: CT HEAD WITHOUT CONTRAST TECHNIQUE: Contiguous axial images were obtained from the base of the skull through the vertex without intravenous contrast. COMPARISON:  09/30/2019 FINDINGS: Brain: No evidence of acute infarction, hemorrhage, hydrocephalus, extra-axial collection or mass lesion/mass effect. Stable appearance of tentorial and parafalcine calcifications. Vascular: No hyperdense vessel or unexpected calcification. Skull: Normal. Negative for fracture or focal lesion. Sinuses/Orbits: No acute finding. Other: None IMPRESSION: No evidence for acute intracranial abnormality. Electronically Signed   By: Nolon Nations M.D.   On: 10/13/2019 20:14   DG Chest Portable 1 View  Result Date: 10/13/2019 CLINICAL DATA:  Shortness of breath, increased weakness and shortness of breath. EXAM: PORTABLE CHEST 1 VIEW COMPARISON:  Chest radiograph 09/30/2019 FINDINGS: Heart size within normal limits. Aortic atherosclerosis. There is no evidence of airspace consolidation. No pleural effusion or pneumothorax. No acute bony abnormality. IMPRESSION: No evidence of acute cardiopulmonary abnormality. Aortic atherosclerosis. Electronically Signed   By: Kellie Simmering DO   On: 10/13/2019 17:02    EKG: Independently reviewed.  Sinus tachycardia (heart rate 104), PVCs, right bundle branch block.  Rate increased since prior tracing.  Assessment/Plan Principal Problem:   Severe  sepsis (HCC) Active Problems:   AKI (acute kidney injury) ([place])   Metabolic acidosis   Acute metabolic encephalopathy   Emesis   Severe sepsis from unknown source Hypothermic with temperature 96 F rectal.  Tachycardic and tachypneic on arrival.  Blood pressure low on arrival. Labs showing WBC count 18.8.  Lactic acid 9.3.  Chest x-ray not suggestive of pneumonia.  CT abdomen pelvis without evidence of acute infectious process. -Received 3 L normal saline boluses.  Currently normotensive and tachycardia has resolved.  Continue IV fluid hydration. -Continue broad-spectrum antibiotics including vancomycin, cefepime,  and metronidazole -UA and urine culture pending -Blood culture x2 pending -Continue to monitor WBC count  AKI Suspect related to severe sepsis/hypotension and severe dehydration.  BUN 52, creatinine 3.5.  Baseline creatinine 0.8. -Continue IV fluid hydration -Continue to monitor renal function and urine output -Avoid nephrotoxic agents/contrast -Renal ultrasound  High anion gap metabolic acidosis Suspect related to AKI and severe lactic acidosis from sepsis. Bicarb 13, anion gap 23.  Salicylate level undetectable.  Blood ethanol level undetectable. -Received IV fluid boluses, repeat stat BMP.  If bicarb continues to be low, give bicarb supplementation. -Continue to monitor labs closely  Acute metabolic encephalopathy Per ED provider communication, patient was lethargic upon arrival to the ED.  Suspect related to severe sepsis/likely underlying infection.  Head CT negative for acute intracranial abnormality.  Blood ethanol level undetectable.  Ammonia level normal.  Currently AAO x3 and answering questions.  No focal neuro deficit. -Management of severe sepsis as mentioned above -Continue to monitor  Abdominal pain, emesis Patient reports having generalized abdominal pain and nonbloody emesis.  No active emesis since he has been in the ED.  No complains of diarrhea.  CT  abdomen pelvis showing no acute intra-abdominal abnormality to explain the patient's symptoms. -IV fluid hydration -Antiemetic as needed -Morphine as needed for pain  Dyspnea Patient reports having dyspnea.  No cough or chest pain.  SARS-CoV-2 PCR test negative.  Influenza panel negative.  Chest x-ray not suggestive of pneumonia.  No increased work of breathing on exam and satting 100% on room air. -Continuous pulse ox, supplemental oxygen as needed -Continue to monitor  Hyperglycemia in the setting of insulin-dependent diabetes mellitus Initial blood glucose 314, received IV fluid boluses.  CBG now improved to 155.  Last A1c 7.51 09/19/2019. -Sliding scale insulin ACHS and CBG checks  Abnormal TSH TSH 6.373.  This could also possibly explain hypothermia. -Check free T4 level  History of GI bleed secondary to colonic AVMs Patient has had 2 hospital admissions in January, most recent colonoscopy 1/29 where no active bleeding was seen.  A single (solitary) ulcer in the cecum. Ulcerated mucosa in the transverse colon. Injected. Clips were placed.  Also revealed internal hemorrhoids.  Hemoglobin currently 9.8, stable since recent hospital discharge.  Patient reports having dark stools. -Check FOBT -Please ensure GI follow-up  History of PE and right leg DVT Anticoagulation was stopped due to recurrent GI bleeds and IVC filter placed on 1/29. CT abdomen pelvis showing increased attenuation and distention of venous structures inferior to the IVC filter, likely representing acute thrombus.  This is not unexpected given history of DVT and anticoagulation being recently stopped due to GI bleed. -Lower extremity Dopplers  DVT prophylaxis: No anticoagulation due to history of GI bleed.  No SCDs given DVT. Code Status: Patient wishes to be full code. Family Communication: No family available at this time. Disposition Plan: Anticipate discharge after clinical improvement. Consults called:  None Admission status: It is my clinical opinion that admission to INPATIENT is reasonable and necessary in this 74 y.o. male . presenting with severe sepsis from unknown source and AKI.  High risk of decompensation.  Given the aforementioned, the predictability of an adverse outcome is felt to be significant. I expect that the patient will require at least 2 midnights in the hospital to treat this condition.   The medical decision making on this patient was of high complexity and the patient is at high risk for clinical deterioration, therefore this is a level 3 visit.  Shela Leff MD [facility]  If 7PM-7AM, please contact night-coverage [email or web address] Password [facility]  10/13/2019, 10:46 PM

## 2019-10-13 NOTE — ED Provider Notes (Signed)
Emergency Department Provider Note   I have reviewed the triage vital signs and the nursing notes.   HISTORY  Chief Complaint Weakness and Shortness of Breath   HPI Matthew Zimmerman is a 74 y.o. male with PMH of HLD, PE is IVC filter, DM, GERD, and recent admit for acute blood loss anemia 2/2 GI bleed presents to the ED with fatigue, low O2 by report.  On arrival the patient tells me that he was recently discharged from the hospital and since that time has had increased weakness, no bowel movements, and was found at his PCP office today to have low oxygen levels.  He denies chest or abdominal pain.  He is passing flatus but not stool per patient. Denies obvious GI bleeding.  He denies subjective shortness of breath or cough.  Patient arrived by private vehicle driven by the patient's son who is not available for additional history at this time. Level 5 caveat: Encephalopathy.    Past Medical History:  Diagnosis Date  . Carpal tunnel syndrome on both sides   . Chest pain   . Diabetes mellitus without complication ([place])   . GERD (gastroesophageal reflux disease)   . Headache   . Hypercholesteremia   . Hyperlipidemia   . PE (pulmonary embolism)   . Shortness of breath dyspnea    PE     Patient Active Problem List   Diagnosis Date Noted  . Severe sepsis ([place]) 10/13/2019  . AKI (acute kidney injury) ([place]) 10/13/2019  . Metabolic acidosis [id number]  . Acute metabolic encephalopathy [id number]  . Emesis 10/13/2019  . GI (gastrointestinal bleed) 09/30/2019  . Syncope 09/30/2019  . Anemia due to acute blood loss 09/18/2019  . Rectal bleed 09/18/2019  . Pulmonary embolism ([place])   . Plantar fasciitis of right foot 05/21/2018  . Neck pain 05/20/2014  . Intractable back pain 05/20/2014  . Warfarin-induced coagulopathy ([place]) 05/20/2014  . Diabetes ([place]) 05/12/2010  . Essential hypertension 05/12/2010  . GERD 05/12/2010  . COLONIC POLYPS, HX OF 05/12/2010    Past Surgical  History:  Procedure Laterality Date  . BILATERAL CARPAL TUNNEL RELEASE Bilateral   . COLONOSCOPY    . COLONOSCOPY WITH PROPOFOL Left 09/20/2019   Procedure: COLONOSCOPY WITH PROPOFOL;  Surgeon: Ronnette Juniper, MD;  Location: [facility];  Service: Gastroenterology;  Laterality: Left;  . COLONOSCOPY WITH PROPOFOL N/A 10/03/2019   Procedure: COLONOSCOPY WITH PROPOFOL;  Surgeon: Wilford Corner, MD;  Location: [facility];  Service: Endoscopy;  Laterality: N/A;  . ELBOW SURGERY     BONE SPURS REMOVED B ELBOWS  . EYE SURGERY Bilateral    cataracts  . FOOT SURGERY     HEEL SPURS REMOVED B HEELS  . HEMOSTASIS CLIP PLACEMENT  09/20/2019   Procedure: HEMOSTASIS CLIP PLACEMENT;  Surgeon: Ronnette Juniper, MD;  Location: [facility];  Service: Gastroenterology;;  . HEMOSTASIS CLIP PLACEMENT  10/03/2019   Procedure: HEMOSTASIS CLIP PLACEMENT;  Surgeon: Wilford Corner, MD;  Location: [facility];  Service: Endoscopy;;  . HOT HEMOSTASIS N/A 09/20/2019   Procedure: HOT HEMOSTASIS (ARGON PLASMA COAGULATION/BICAP);  Surgeon: Ronnette Juniper, MD;  Location: [facility];  Service: Gastroenterology;  Laterality: N/A;  . I & D EXTREMITY Right 09/18/2014   Procedure: IRRIGATION AND DEBRIDEMENT RIGHT THUMB, open fracture repair;  Surgeon: Linna Hoff, MD;  Location: [facility];  Service: Orthopedics;  Laterality: Right;  . IR IVC FILTER PLMT / S&I Burke Keels GUID/MOD SED  10/03/2019  . LEG SURGERY Left    left thigh  table saw accident, "cut to the bone", I&D/suturing  . SHOULDER SURGERY Left    Bone spur  . SPINE SURGERY  09/04/1985   lumbar spine  . SUBMUCOSAL INJECTION  10/03/2019   Procedure: SUBMUCOSAL INJECTION;  Surgeon: Wilford Corner, MD;  Location: [facility];  Service: Endoscopy;;  . TOE SURGERY      Allergies Cortisone  Family History  Problem Relation Age of Onset  . Cancer Mother   . Diabetes Father     Social History Social History   Tobacco Use  . Smoking status: Former Smoker    Packs/day:  0.50    Types: Cigarettes    Quit date: 1964    Years since quitting: 57.1  . Smokeless tobacco: Former Systems developer  . Tobacco comment: smoking since 74 years old  Substance Use Topics  . Alcohol use: No  . Drug use: No    Review of Systems  Constitutional: No fever/chills. Positive generalized weakness.  Eyes: No visual changes. ENT: No sore throat. Cardiovascular: Denies chest pain. Respiratory: Denies shortness of breath. Gastrointestinal: No abdominal pain.  No nausea, no vomiting.  No diarrhea. Positive constipation. Genitourinary: Negative for dysuria. Musculoskeletal: Negative for back pain. Skin: Negative for rash. Neurological: Negative for headaches, focal weakness or numbness.  10-point ROS otherwise negative.  ____________________________________________   PHYSICAL EXAM:  VITAL SIGNS: ED Triage Vitals  Enc Vitals Group     BP 10/13/19 1608 99/64     Pulse Rate 10/13/19 1608 (!) 105     Resp 10/13/19 1608 (!) 30     Temp 10/13/19 1608 (!) 97.5 F (36.4 C)     Temp Source 10/13/19 1608 Oral     SpO2 --      Weight 10/13/19 1612 189 lb 9.5 oz (86 kg)     Height 10/13/19 1612 5\' 8"  (1.727 m)   Constitutional: Alert with some confusion noted. Appears chronically unwell but able to provide a brief history.  Eyes: Conjunctivae are normal. PERRL.  Head: Atraumatic. Nose: No congestion/rhinnorhea. Mouth/Throat: Mucous membranes are dry.  Neck: No stridor.   Cardiovascular: Tachycardia. Good peripheral circulation. Grossly normal heart sounds.   Respiratory: Normal respiratory effort.  No retractions. Lungs CTAB. Gastrointestinal: Soft with mild tenderness in the LLQ. No distention.  Musculoskeletal: No lower extremity tenderness nor edema. No gross deformities of extremities. Neurologic:  Normal speech and language. No gross focal neurologic deficits are appreciated.   Skin:  Skin is warm, dry and intact. No rash noted.  ____________________________________________    LABS (all labs ordered are listed, but only abnormal results are displayed)  Labs Reviewed  COMPREHENSIVE METABOLIC PANEL - Abnormal; Notable for the following components:      Result Value   CO2 13 (*)    Glucose, Bld 314 (*)    BUN 52 (*)    Creatinine, Ser 3.56 (*)    Total Protein 8.4 (*)    Total Bilirubin 1.3 (*)    GFR calc non Af Amer 16 (*)    GFR calc Af Amer 18 (*)    Anion gap 23 (*)    All other components within normal limits  LACTIC ACID, PLASMA - Abnormal; Notable for the following components:   Lactic Acid, Venous 9.3 (*)    All other components within normal limits  LACTIC ACID, PLASMA - Abnormal; Notable for the following components:   Lactic Acid, Venous 6.1 (*)    All other components within normal limits  CBC WITH DIFFERENTIAL/PLATELET - Abnormal; Notable  for the following components:   WBC 18.8 (*)    RBC 3.64 (*)    Hemoglobin 9.8 (*)    HCT 32.6 (*)    RDW 17.7 (*)    nRBC 0.3 (*)    Neutro Abs 12.8 (*)    Monocytes Absolute 1.3 (*)    Abs Immature Granulocytes 0.61 (*)    All other components within normal limits  ACETAMINOPHEN LEVEL - Abnormal; Notable for the following components:   Acetaminophen (Tylenol), Serum <10 (*)    All other components within normal limits  SALICYLATE LEVEL - Abnormal; Notable for the following components:   Salicylate Lvl [id number] (*)    All other components within normal limits  PROTIME-INR - Abnormal; Notable for the following components:   Prothrombin Time 16.4 (*)    INR 1.3 (*)    All other components within normal limits  TSH - Abnormal; Notable for the following components:   TSH 6.373 (*)    All other components within normal limits  CBG MONITORING, ED - Abnormal; Notable for the following components:   Glucose-Capillary 241 (*)    All other components within normal limits  CBG MONITORING, ED - Abnormal; Notable for the following components:   Glucose-Capillary 155 (*)    All other components within  normal limits  RESPIRATORY PANEL BY RT PCR (FLU A&B, COVID)  URINE CULTURE  CULTURE, BLOOD (ROUTINE X 2)  CULTURE, BLOOD (ROUTINE X 2)  MRSA PCR SCREENING  ETHANOL  AMMONIA  URINALYSIS, ROUTINE W REFLEX MICROSCOPIC  CBC  LACTIC ACID, PLASMA  BASIC METABOLIC PANEL  BASIC METABOLIC PANEL  T4, FREE  OCCULT BLOOD X 1 CARD TO LAB, STOOL  TYPE AND SCREEN   ____________________________________________  EKG   EKG Interpretation  Date/Time:  Monday October 13 2019 16:10:34 EST Ventricular Rate:  104 PR Interval:    QRS Duration: 133 QT Interval:  354 QTC Calculation: 466 R Axis:   25 Text Interpretation: Sinus tachycardia Ventricular premature complex Right bundle branch block Similar to prior. No STEMI. Confirmed by Nanda Quinton [phone]) on 10/13/2019 4:16:03 PM       ____________________________________________  RADIOLOGY  CT ABDOMEN PELVIS WO CONTRAST  Result Date: 10/13/2019 CLINICAL DATA:  Abdominal pain, fever, rectal bleeding EXAM: CT ABDOMEN AND PELVIS WITHOUT CONTRAST TECHNIQUE: Multidetector CT imaging of the abdomen and pelvis was performed following the standard protocol without IV contrast. COMPARISON:  01/16/2018 FINDINGS: Lower chest: No acute pleural or parenchymal lung disease. Hepatobiliary: No focal liver abnormality is seen. No gallstones, gallbladder wall thickening, or biliary dilatation. Pancreas: Unremarkable. No pancreatic ductal dilatation or surrounding inflammatory changes. Spleen: Normal in size without focal abnormality. Adrenals/Urinary Tract: No evidence of urinary tract calculi or obstructive uropathy within either kidney. Stable right renal cyst. Bladder is minimally distended which limits evaluation. The adrenals are unremarkable. Stomach/Bowel: No bowel obstruction or ileus. Normal retrocecal appendix. Vascular/Lymphatic: IVC filter is identified. Within the iliac veins and IVC below the level of the filter, there is increased attenuation, venous  distension, and mild surrounding fat stranding which may reflect acute thrombus. This would not be unexpected in a patient with a history of DVT who has recently discontinued anticoagulation due to complications. No pathologic adenopathy within the abdomen or pelvis. Reproductive: Mildly enlarged prostate is otherwise unremarkable. Other: No abdominal wall hernia or abnormality. No abdominopelvic ascites. Musculoskeletal: There are no acute or destructive bony lesions. Reconstructed images demonstrate no additional findings. IMPRESSION: 1. IVC filter as above, with increased attenuation and  distension of the venous structures inferior to the filter likely representing acute thrombus. This is not unexpected in a patient with a history of DVT who has recently discontinued anticoagulation due to bleeding complications. 2. No evidence of urinary tract calculi or obstructive uropathy. 3. Otherwise unremarkable unenhanced exam. Electronically Signed   By: Randa Ngo M.D.   On: 10/13/2019 20:13   CT Head Wo Contrast  Result Date: 10/13/2019 CLINICAL DATA:  Encephalopathy. Increased weakness and low oxygen levels. Multiple colonoscopies for rectal bleeding. EXAM: CT HEAD WITHOUT CONTRAST TECHNIQUE: Contiguous axial images were obtained from the base of the skull through the vertex without intravenous contrast. COMPARISON:  09/30/2019 FINDINGS: Brain: No evidence of acute infarction, hemorrhage, hydrocephalus, extra-axial collection or mass lesion/mass effect. Stable appearance of tentorial and parafalcine calcifications. Vascular: No hyperdense vessel or unexpected calcification. Skull: Normal. Negative for fracture or focal lesion. Sinuses/Orbits: No acute finding. Other: None IMPRESSION: No evidence for acute intracranial abnormality. Electronically Signed   By: Nolon Nations M.D.   On: 10/13/2019 20:14   US RENAL  Result Date: 10/13/2019 CLINICAL DATA:  Acute kidney injury EXAM: RENAL / URINARY TRACT  ULTRASOUND COMPLETE COMPARISON:  CT same day FINDINGS: Right Kidney: Renal measurements: 10.3 x 5.3 x 5.5 cm = volume: 155 mL . Echogenicity within normal limits. There is an anechoic cyst seen within the right kidney measuring 4.3 x 4.1 x 4.3 cm. Left Kidney: Renal measurements: 10.3 x 5 x 5 x 4.6 cm = volume: 131 mL. Echogenicity within normal limits. No mass or hydronephrosis visualized. Bladder: Appears normal for degree of bladder distention. Other: None. IMPRESSION: Right anechoic cyst measuring 4.3 x 4.1 x 4.3 cm. Otherwise normal renal ultrasound. Electronically Signed   By: Prudencio Pair M.D.   On: 10/13/2019 22:52   DG Chest Portable 1 View  Result Date: 10/13/2019 CLINICAL DATA:  Shortness of breath, increased weakness and shortness of breath. EXAM: PORTABLE CHEST 1 VIEW COMPARISON:  Chest radiograph 09/30/2019 FINDINGS: Heart size within normal limits. Aortic atherosclerosis. There is no evidence of airspace consolidation. No pleural effusion or pneumothorax. No acute bony abnormality. IMPRESSION: No evidence of acute cardiopulmonary abnormality. Aortic atherosclerosis. Electronically Signed   By: Kellie Simmering DO   On: 10/13/2019 17:02    ____________________________________________   PROCEDURES  Procedure(s) performed:   Procedures  CRITICAL CARE Performed by: Margette Fast Total critical care time: 35 minutes Critical care time was exclusive of separately billable procedures and treating other patients. Critical care was necessary to treat or prevent imminent or life-threatening deterioration. Critical care was time spent personally by me on the following activities: development of treatment plan with patient and/or surrogate as well as nursing, discussions with consultants, evaluation of patient's response to treatment, examination of patient, obtaining history from patient or surrogate, ordering and performing treatments and interventions, ordering and review of laboratory studies,  ordering and review of radiographic studies, pulse oximetry and re-evaluation of patient's condition.  Nanda Quinton, MD Emergency Medicine  ____________________________________________   INITIAL IMPRESSION / ASSESSMENT AND PLAN / ED COURSE  Pertinent labs & imaging results that were available during my care of the patient were reviewed by me and considered in my medical decision making (see chart for details).   Patient arrives to the emergency department with increased generalized weakness with report of low oxygen levels.  He does have tachypnea with tachycardia and borderline low blood pressure.  He does have known history of PE but IVC filter has been placed in  January.  He has no longer anticoagulated with AVM bleeding in the past with symptomatic anemia leading to syncope. Recent admit requiring PRBC transfusion. No exam findings to suspect CVA. No known COVID diagnosis and tests in our system are negative. Attempted to reach family for additional history but no answer.   05:55 PM  Patient with lactic acid of 9.  His creatinine also elevated to 3.56 with BUN of 52.  On my reassessment the patient is more awake and alert after initial IV fluid bolus.  Antibiotics have been started.  I have added additional 30/kg normal saline bolus.  He is afebrile with normal blood pressure.  CT scan changed to abdomen pelvis without contrast. CT head pending. CXR with no acute process visualized.   Lactate down-trending. CT abdomen/pelvis with IVC filter changes "expected" per radiology read. Mental status improved here. CT head negative. Continue IVF and abx and will admit. No clear infection source at this time.   Discussed patient's case with [facility] to request admission. Patient and family (if present) updated with plan. Care transferred to [facility] service.  I reviewed all nursing notes, vitals, pertinent old records, EKGs, labs, imaging (as available).  ____________________________________________  FINAL  CLINICAL IMPRESSION(S) / ED DIAGNOSES  Final diagnoses:  AKI (acute kidney injury) ([place])  Generalized weakness     MEDICATIONS GIVEN DURING THIS VISIT:  Medications  metroNIDAZOLE (FLAGYL) IVPB 500 mg (has no administration in time range)  atorvastatin (LIPITOR) tablet 10 mg (has no administration in time range)  vitamin B-12 (CYANOCOBALAMIN) tablet 100 mcg (has no administration in time range)  acetaminophen (TYLENOL) tablet 650 mg (has no administration in time range)    Or  acetaminophen (TYLENOL) suppository 650 mg (has no administration in time range)  insulin aspart (novoLOG) injection 0-15 Units (has no administration in time range)  insulin aspart (novoLOG) injection 0-5 Units (0 Units Subcutaneous Not Given 10/13/19 2224)  ceFEPIme (MAXIPIME) 2 g in sodium chloride 0.9 % 100 mL IVPB (has no administration in time range)  ondansetron (ZOFRAN) injection 4 mg (has no administration in time range)  morphine 2 MG/ML injection 1 mg (has no administration in time range)  0.9 %  sodium chloride infusion (has no administration in time range)  Chlorhexidine Gluconate Cloth 2 % PADS 6 each (has no administration in time range)  ceFEPIme (MAXIPIME) 2 g in sodium chloride 0.9 % 100 mL IVPB (0 g Intravenous Stopped 10/13/19 1759)  metroNIDAZOLE (FLAGYL) IVPB 500 mg (0 mg Intravenous Stopped 10/13/19 1905)  vancomycin (VANCOREADY) IVPB 1750 mg/350 mL (0 mg Intravenous Stopped 10/13/19 2223)  sodium chloride 0.9 % bolus 500 mL (0 mLs Intravenous Stopped 10/13/19 2213)  0.9 %  sodium chloride infusion ( Intravenous New Bag/Given (Non-Interop) 10/13/19 2219)  sodium chloride 0.9 % bolus 1,000 mL (0 mLs Intravenous Stopped 10/13/19 1907)    And  sodium chloride 0.9 % bolus 1,000 mL (0 mLs Intravenous Stopped 10/13/19 1905)    And  sodium chloride 0.9 % bolus 500 mL (0 mLs Intravenous Stopped 10/13/19 2213)    Note:  This document was prepared using Dragon voice recognition software and may include  unintentional dictation errors.  Nanda Quinton, MD, [facility] Emergency Medicine    , [facility], MD 10/13/19 [phone]

## 2019-10-13 NOTE — ED Notes (Signed)
Pt transported to CT ?

## 2019-10-13 NOTE — Progress Notes (Signed)
A consult was received from an ED physician for vanc/cefepime per pharmacy dosing.  The patient's profile has been reviewed for ht/wt/allergies/indication/available labs.   A one time order has been placed for vanc 1750mg  and cefepime 2g.  Further antibiotics/pharmacy consults should be ordered by admitting physician if indicated.                       Thank you, Kara Mead 10/13/2019  5:05 PM

## 2019-10-13 NOTE — ED Notes (Signed)
Date and time results received: 10/13/19 1749 (use smartphrase ".now" to insert current time)  Test: Lactic Acid  Critical Value: 9.3 mmol/L  Name of Provider Notified: Long   Orders Received? Or Actions Taken?: Actions Taken: MD aware

## 2019-10-14 ENCOUNTER — Other Ambulatory Visit: Payer: Self-pay | Admitting: *Deleted

## 2019-10-14 ENCOUNTER — Inpatient Hospital Stay (HOSPITAL_COMMUNITY): Payer: HMO

## 2019-10-14 DIAGNOSIS — I2699 Other pulmonary embolism without acute cor pulmonale: Secondary | ICD-10-CM

## 2019-10-14 DIAGNOSIS — I82409 Acute embolism and thrombosis of unspecified deep veins of unspecified lower extremity: Secondary | ICD-10-CM

## 2019-10-14 DIAGNOSIS — Z794 Long term (current) use of insulin: Secondary | ICD-10-CM

## 2019-10-14 DIAGNOSIS — D509 Iron deficiency anemia, unspecified: Secondary | ICD-10-CM

## 2019-10-14 DIAGNOSIS — Z86711 Personal history of pulmonary embolism: Secondary | ICD-10-CM

## 2019-10-14 DIAGNOSIS — G9341 Metabolic encephalopathy: Secondary | ICD-10-CM

## 2019-10-14 DIAGNOSIS — E119 Type 2 diabetes mellitus without complications: Secondary | ICD-10-CM

## 2019-10-14 DIAGNOSIS — R531 Weakness: Secondary | ICD-10-CM

## 2019-10-14 DIAGNOSIS — R0602 Shortness of breath: Secondary | ICD-10-CM

## 2019-10-14 DIAGNOSIS — N179 Acute kidney failure, unspecified: Secondary | ICD-10-CM

## 2019-10-14 DIAGNOSIS — E872 Acidosis: Secondary | ICD-10-CM

## 2019-10-14 DIAGNOSIS — I82403 Acute embolism and thrombosis of unspecified deep veins of lower extremity, bilateral: Secondary | ICD-10-CM

## 2019-10-14 LAB — CBC WITH DIFFERENTIAL/PLATELET
Abs Immature Granulocytes: 0.16 10*3/uL — ABNORMAL HIGH (ref 0.00–0.07)
Basophils Absolute: 0.1 10*3/uL (ref 0.0–0.1)
Basophils Relative: 1 %
Eosinophils Absolute: 0.1 10*3/uL (ref 0.0–0.5)
Eosinophils Relative: 1 %
HCT: 23.5 % — ABNORMAL LOW (ref 39.0–52.0)
Hemoglobin: 7.2 g/dL — ABNORMAL LOW (ref 13.0–17.0)
Immature Granulocytes: 2 %
Lymphocytes Relative: 17 %
Lymphs Abs: 1.7 10*3/uL (ref 0.7–4.0)
MCH: 27.3 pg (ref 26.0–34.0)
MCHC: 30.6 g/dL (ref 30.0–36.0)
MCV: 89 fL (ref 80.0–100.0)
Monocytes Absolute: 1 10*3/uL (ref 0.1–1.0)
Monocytes Relative: 10 %
Neutro Abs: 7.4 10*3/uL (ref 1.7–7.7)
Neutrophils Relative %: 69 %
Platelets: 188 10*3/uL (ref 150–400)
RBC: 2.64 MIL/uL — ABNORMAL LOW (ref 4.22–5.81)
RDW: 18.2 % — ABNORMAL HIGH (ref 11.5–15.5)
WBC: 10.4 10*3/uL (ref 4.0–10.5)
nRBC: 0.3 % — ABNORMAL HIGH (ref 0.0–0.2)

## 2019-10-14 LAB — BASIC METABOLIC PANEL
Anion gap: 15 (ref 5–15)
Anion gap: 13 (ref 5–15)
BUN: 49 mg/dL — ABNORMAL HIGH (ref 8–23)
BUN: 49 mg/dL — ABNORMAL HIGH (ref 8–23)
CO2: 15 mmol/L — ABNORMAL LOW (ref 22–32)
CO2: 16 mmol/L — ABNORMAL LOW (ref 22–32)
Calcium: 8.3 mg/dL — ABNORMAL LOW (ref 8.9–10.3)
Calcium: 7.9 mg/dL — ABNORMAL LOW (ref 8.9–10.3)
Chloride: 108 mmol/L (ref 98–111)
Chloride: 111 mmol/L (ref 98–111)
Creatinine, Ser: 2.83 mg/dL — ABNORMAL HIGH (ref 0.61–1.24)
Creatinine, Ser: 2.64 mg/dL — ABNORMAL HIGH (ref 0.61–1.24)
GFR calc Af Amer: 24 mL/min — ABNORMAL LOW (ref >60)
GFR calc Af Amer: 26 mL/min — ABNORMAL LOW (ref >60)
GFR calc non Af Amer: 21 mL/min — ABNORMAL LOW (ref >60)
GFR calc non Af Amer: 23 mL/min — ABNORMAL LOW (ref >60)
Glucose, Bld: 145 mg/dL — ABNORMAL HIGH (ref 70–99)
Glucose, Bld: 101 mg/dL — ABNORMAL HIGH (ref 70–99)
Potassium: 5.1 mmol/L (ref 3.5–5.1)
Potassium: 4.1 mmol/L (ref 3.5–5.1)
Sodium: 138 mmol/L (ref 135–145)
Sodium: 140 mmol/L (ref 135–145)

## 2019-10-14 LAB — RAPID URINE DRUG SCREEN, HOSP PERFORMED
Amphetamines: NOT DETECTED
Barbiturates: NOT DETECTED
Benzodiazepines: NOT DETECTED
Cocaine: NOT DETECTED
Opiates: NOT DETECTED
Tetrahydrocannabinol: NOT DETECTED

## 2019-10-14 LAB — CBC
HCT: 25 % — ABNORMAL LOW (ref 39.0–52.0)
Hemoglobin: 7.6 g/dL — ABNORMAL LOW (ref 13.0–17.0)
MCH: 27.8 pg (ref 26.0–34.0)
MCHC: 30.4 g/dL (ref 30.0–36.0)
MCV: 91.6 fL (ref 80.0–100.0)
Platelets: 173 10*3/uL (ref 150–400)
RBC: 2.73 MIL/uL — ABNORMAL LOW (ref 4.22–5.81)
RDW: 17.9 % — ABNORMAL HIGH (ref 11.5–15.5)
WBC: 12.4 10*3/uL — ABNORMAL HIGH (ref 4.0–10.5)
nRBC: 0.2 % (ref 0.0–0.2)

## 2019-10-14 LAB — URINALYSIS, ROUTINE W REFLEX MICROSCOPIC
Bilirubin Urine: NEGATIVE
Glucose, UA: 50 mg/dL — AB
Hgb urine dipstick: NEGATIVE
Ketones, ur: NEGATIVE mg/dL
Leukocytes,Ua: NEGATIVE
Nitrite: NEGATIVE
Protein, ur: NEGATIVE mg/dL
Specific Gravity, Urine: 1.016 (ref 1.005–1.030)
pH: 5 (ref 5.0–8.0)

## 2019-10-14 LAB — LACTIC ACID, PLASMA
Lactic Acid, Venous: 4.4 mmol/L (ref 0.5–1.9)
Lactic Acid, Venous: 3.1 mmol/L (ref 0.5–1.9)

## 2019-10-14 LAB — MRSA PCR SCREENING: MRSA by PCR: NEGATIVE

## 2019-10-14 LAB — GLUCOSE, CAPILLARY
Glucose-Capillary: 144 mg/dL — ABNORMAL HIGH (ref 70–99)
Glucose-Capillary: 93 mg/dL (ref 70–99)
Glucose-Capillary: 126 mg/dL — ABNORMAL HIGH (ref 70–99)
Glucose-Capillary: 132 mg/dL — ABNORMAL HIGH (ref 70–99)
Glucose-Capillary: 154 mg/dL — ABNORMAL HIGH (ref 70–99)

## 2019-10-14 LAB — IRON AND TIBC
Iron: 37 ug/dL — ABNORMAL LOW (ref 45–182)
Saturation Ratios: 10 % — ABNORMAL LOW (ref 17.9–39.5)
TIBC: 362 ug/dL (ref 250–450)
UIBC: 325 ug/dL

## 2019-10-14 LAB — CK: Total CK: 294 U/L (ref 49–397)

## 2019-10-14 LAB — PREPARE RBC (CROSSMATCH)

## 2019-10-14 LAB — FERRITIN: Ferritin: 22 ng/mL — ABNORMAL LOW (ref 24–336)

## 2019-10-14 LAB — OCCULT BLOOD X 1 CARD TO LAB, STOOL: Fecal Occult Bld: NEGATIVE

## 2019-10-14 LAB — FOLATE: Folate: 10.6 ng/mL (ref >5.9)

## 2019-10-14 LAB — T4, FREE: Free T4: 0.98 ng/dL (ref 0.61–1.12)

## 2019-10-14 LAB — VITAMIN B12: Vitamin B-12: 346 pg/mL (ref 180–914)

## 2019-10-14 MED ORDER — DIPHENHYDRAMINE HCL 25 MG PO CAPS
25.0000 mg | ORAL_CAPSULE | Freq: Once | ORAL | Status: AC
  Administered 2019-10-14: 25 mg via ORAL
  Filled 2019-10-14: qty 1

## 2019-10-14 MED ORDER — FUROSEMIDE 10 MG/ML IJ SOLN
20.0000 mg | Freq: Once | INTRAMUSCULAR | Status: AC
  Administered 2019-10-14: 20 mg via INTRAVENOUS
  Filled 2019-10-14: qty 2

## 2019-10-14 MED ORDER — SODIUM CHLORIDE 0.9 % IV BOLUS
1000.0000 mL | Freq: Once | INTRAVENOUS | Status: AC
  Administered 2019-10-14: 1000 mL via INTRAVENOUS

## 2019-10-14 MED ORDER — PANTOPRAZOLE SODIUM 40 MG IV SOLR
40.0000 mg | Freq: Two times a day (BID) | INTRAVENOUS | Status: DC
  Administered 2019-10-14 – 2019-10-17 (×6): 40 mg via INTRAVENOUS
  Filled 2019-10-14 (×6): qty 40

## 2019-10-14 MED ORDER — ACETAMINOPHEN 325 MG PO TABS
650.0000 mg | ORAL_TABLET | Freq: Once | ORAL | Status: AC
  Administered 2019-10-14: 650 mg via ORAL
  Filled 2019-10-14: qty 2

## 2019-10-14 MED ORDER — SODIUM CHLORIDE 0.9% IV SOLUTION: Freq: Once | INTRAVENOUS | Status: AC

## 2019-10-14 MED ORDER — VANCOMYCIN HCL 1500 MG/300ML IV SOLN: 1500.0000 mg | INTRAVENOUS | Status: DC

## 2019-10-14 MED ORDER — ORAL CARE MOUTH RINSE
15.0000 mL | Freq: Two times a day (BID) | OROMUCOSAL | Status: DC
  Administered 2019-10-14 – 2019-10-17 (×5): 15 mL via OROMUCOSAL

## 2019-10-14 NOTE — Progress Notes (Signed)
Bilateral lower extremity venous duplex and IVC/iliac duplex completed. Refer to "CV Proc" under chart review to view preliminary results.  Critical results discussed with Dr. [place] Silos and Clarene Critchley, [place].  10/14/2019 3:03 PM Kelby Aline., MHA, RVT, RDCS, RDMS

## 2019-10-14 NOTE — Progress Notes (Signed)
PROGRESS NOTE    Matthew Zimmerman  ZOX:[id number] DOB: 1946/05/19 DOA: 10/13/2019 PCP: Jani Gravel, MD    Brief Narrative:  HPI per Dr. Dossie Der is a 74 y.o. male with medical history significant of insulin-dependent diabetes mellitus, hypertension, hyperlipidemia, PE status post IVC filter not on anticoagulation due to history of GI bleed, GERD, recent hospital admissions for acute blood loss anemia secondary to GI bleed from colonic AVMs status post blood transfusions presenting to the ED for evaluation of generalized weakness and shortness of breath.  Patient states he has not been feeling well for the past 3 days.  He is sneezing.  Also having shortness of breath.  He is having some generalized abdominal discomfort.  Reports having nonbloody emesis for the past few days.  His stool is dark in color.  No diarrhea.  Denies fevers, chills, cough, or chest pain.  No additional history could be obtained from the patient.  ED Course: Temperature as low as 96 F rectal.  Tachycardic and tachypneic on arrival.  Blood pressure low on arrival.  Not hypoxic.  Labs showing WBC count 18.8.  Lactic acid 9.3.  Hemoglobin 9.8, stable since recent hospital discharge.  Bicarb 13, anion gap 23.  Blood glucose 314.  BUN 52, creatinine 3.5.  Baseline creatinine 0.8.  Blood ethanol level <10.  Acetaminophen level <10.  Salicylate level <7.  Ammonia level normal.  TSH 6.373.  Blood culture x2 pending.  UA and urine culture pending.  SARS-CoV-2 PCR test negative.  Influenza panel negative.  Chest x-ray showing no acute cardiopulmonary abnormality.  Head CT negative for acute intracranial abnormality.  CT abdomen pelvis showing increased attenuation and distention of venous structures inferior to the IVC filter, likely representing acute thrombus.  No other acute intra-abdominal abnormality.  Patient received vancomycin, cefepime, metronidazole, and 3 L normal saline boluses.   Assessment & Plan:     Principal Problem:   Severe sepsis ([facility]) Active Problems:   AKI (acute kidney injury) ([place])   Metabolic acidosis   Acute metabolic encephalopathy   Emesis   Generalized weakness   Acute renal failure ([facility])   SOB (shortness of breath)   Iron deficiency anemia   Insulin dependent type 2 diabetes mellitus ([place])   History of pulmonary embolus (PE)   Acute deep vein thrombosis (DVT) of both lower extremities ([facility])  1 severe sepsis with hypotension, unknown source, POA Patient presented with hypothermia with a temperature of 96 Fahrenheit rectally, tachycardic and tachypneic meeting criteria for sepsis.  Patient noted to have a leukocytosis of 18.8 and a lactic acid level of 9.3.  Chest x-ray done negative for acute infiltrate.  CT abdomen and pelvis done negative for any acute infectious etiology.  Patient received 3 L bolus of normal saline in the ED with improvement with hypotension and tachycardia.  Patient pancultured and cultures pending.  Wife insistent on patient being checked for strep throat and as such a culture for strep has been ordered.  UA nitrite negative, leukocytes negative.  Patient with no urinary symptoms.  Continue empiric IV vancomycin, IV cefepime, IV Flagyl.  Follow.  2.  Anemia/history of recent GI bleed secondary to colonic AVMs Patient noted to have 2 hospital admissions in January for GI bleed most recently colonoscopy done 10/03/2019 with no active bleed seen.  Single ulcer noted in the cecum.  Ulcerated mucosa in the transverse colon was injected and clips placed.  Noted to have internal hemorrhoids.  Hemoglobin on admission was 9.8 however  hemoglobin currently this morning at 7.6.  Likely dilutional effect.  Anemia panel consistent with iron deficiency anemia with iron level of 37, ferritin of 22, folate of 10.6, vitamin B12 of 346.  Patient denies any significant overt bleeding.  FOBT pending.  Per wife patient with some bouts of hematemesis while vomiting prior to  admission.  Patient with no overt bleeding noted.  Repeat H&H this afternoon and transfuse for hemoglobin less than 7.  3.  Acute renal failure Likely secondary to a prerenal azotemia in the setting of sepsis, hypotension.  Patient noted to be hypotensive on admission and as such antihypertensive medications on hold.  Renal function trending down with hydration.  Continue IV fluids.  Follow.  4.  Acute metabolic encephalopathy Per admitting physician was noted per ED provider that patient was lethargic upon arrival to the ED felt likely secondary to underlying sepsis.  Head CT negative.  Alcohol level negative.  Ammonia level within normal limits.  Patient alert following commands and answering questions with no focal neurological deficits.  Continue treatment as in problem #1.  Follow.  5.  Abdominal pain/emesis On admission patient did report some generalized abdominal pain with nonbloody emesis.  Patient's wife however on the telephone stated that she felt patient may have had some hematemesis describing as initially bright red and then subsequently a dark red color.  Patient with no hematemesis while in the hospital this admission.  CT abdomen and pelvis done negative for any intra-abdominal abnormality to explain patient's symptoms.  IV fluids, antiemetics, pain management.  6.  Dyspnea Patient with concerns of dyspnea.  SARS Covid 2 PCR negative.  Influenza PCR negative.  Chest x-ray on admission negative for pneumonia.  Repeat chest x-ray this morning negative.  Patient with sats of 100% on room air.  Monitor.  7.  Insulin-dependent diabetes mellitus Type 2 Hemoglobin A1c 7.5 on September 19, 2019.  Hold oral hypoglycemic agents.  Continue sliding scale insulin.  8.  High anion gap metabolic acidosis Likely secondary to acute renal failure and severe lactic acidosis from sepsis.  Bicarb was 13 on admission with a anion gap of 23.  Salicylate level undetectable.  Alcohol level undetectable.   Bicarb levels improving.  Continue hydration.  Follow.  9.  Abnormal TSH TSH of 6.373.  Free T4 pending.  Will need repeat thyroid function studies done in 4 to 6 weeks post discharge.  10.  History of PE and right lower extremity DVT Anticoagulation discontinued during last hospitalization due to recurrent GI bleeds.  Patient status post IVC filter placed on 10/03/2019.  CT abdomen and pelvis which was done showed increased attenuation and distention of the venous structures inferior to the IVC likely representing acute thrombus.  Not unexpected given history of DVT and anticoagulation being held.  Lower extremity Dopplers pending.  Follow.    DVT prophylaxis: Patient with recent GI bleed unable to place on anticoagulation also with anemia.  Patient with DVTs unable to place on SCDs. Code Status: Full Family Communication: Updated patient.  Updated wife via telephone. Disposition Plan:   Patient came from: Home            Anticipated d/c place: To be determined  Barriers to d/c OR conditions which need to be met to effect a safe d/c: Likely home once sepsis has resolved, etiology of sepsis and source known, stabilization of anemia, and per consultants.   Consultants:   Gastroenterology pending.  Procedures:   Lower extremity Dopplers pending  Chest x-ray 10/13/2019, 10/14/2019  CT abdomen and pelvis 10/13/2019  CT head 10/13/2019  Antimicrobials:  IV cefepime 10/13/2019  IV Flagyl 10/13/2019  IV vancomycin 10/13/2019   Subjective: PATIENT laying in bed still with some complaints of shortness of breath.  Denies any rectal bleeding.  Denies any chest pain.  States not feeling too well.  Per wife patient had a bout of hematemesis after several episodes of vomiting.  Objective: Vitals:   10/14/19 1100 10/14/19 1200 10/14/19 1342 10/14/19 1400  BP: 118/72 (!) 99/41  (!) 106/41  Pulse: 78 84  86  Resp: (!) 23 (!) 23  (!) 24  Temp:   98.3 F (36.8 C)   TempSrc:   Oral   SpO2: 100%  100%  100%  Weight:      Height:        Intake/Output Summary (Last 24 hours) at 10/14/2019 1522 Last data filed at 10/14/2019 1200 Gross per 24 hour  Intake 6094.39 ml  Output 750 ml  Net 5344.39 ml   Filed Weights   10/13/19 1612  Weight: 86 kg    Examination:  General exam: Appears calm and comfortable  Respiratory system: Clear to auscultation. Respiratory effort normal. Cardiovascular system: S1 & S2 heard, RRR. No JVD, murmurs, rubs, gallops or clicks. No pedal edema. Gastrointestinal system: Abdomen is nondistended, soft and nontender. No organomegaly or masses felt. Normal bowel sounds heard. Central nervous system: Alert and oriented. No focal neurological deficits. Extremities: Symmetric 5 x 5 power. Skin: No rashes, lesions or ulcers Psychiatry: Judgement and insight appear normal. Mood & affect appropriate.     Data Reviewed: I have personally reviewed following labs and imaging studies  CBC: Recent Labs  Lab 10/13/19 1636 10/14/19 0245  WBC 18.8* 12.4*  NEUTROABS 12.8*  --   HGB 9.8* 7.6*  HCT 32.6* 25.0*  MCV 89.6 91.6  PLT 233 867   Basic Metabolic Panel: Recent Labs  Lab 10/13/19 1636 10/13/19 2310 10/14/19 0245  NA 136 138 140  K 4.1 5.1 4.1  CL 100 108 111  CO2 13* 15* 16*  GLUCOSE 314* 145* 101*  BUN 52* 49* 49*  CREATININE 3.56* 2.83* 2.64*  CALCIUM 9.3 8.3* 7.9*   GFR: Estimated Creatinine Clearance: 26.2 mL/min (A) (by C-G formula based on SCr of 2.64 mg/dL (H)). Liver Function Tests: Recent Labs  Lab 10/13/19 1636  AST 23  ALT 15  ALKPHOS 78  BILITOT 1.3*  PROT 8.4*  ALBUMIN 4.0   No results for input(s): LIPASE, AMYLASE in the last 168 hours. Recent Labs  Lab 10/13/19 1650  AMMONIA 23   Coagulation Profile: Recent Labs  Lab 10/13/19 1636  INR 1.3*   Cardiac Enzymes: Recent Labs  Lab 10/14/19 0007  CKTOTAL 294   BNP (last 3 results) No results for input(s): PROBNP in the last 8760 hours. HbA1C: No results  for input(s): HGBA1C in the last 72 hours. CBG: Recent Labs  Lab 10/13/19 1628 10/13/19 2222 10/13/19 2317 10/14/19 0822 10/14/19 1333  GLUCAP 241* 155* 144* 93 126*   Lipid Profile: No results for input(s): CHOL, HDL, LDLCALC, TRIG, CHOLHDL, LDLDIRECT in the last 72 hours. Thyroid Function Tests: Recent Labs    10/13/19 1636 10/14/19 0245  TSH 6.373*  --   FREET4  --  0.98   Anemia Panel: Recent Labs    10/14/19 0822  VITAMINB12 346  FOLATE 10.6  FERRITIN 22*  TIBC 362  IRON 37*   Sepsis Labs: Recent Labs  Lab 10/13/19 1636 10/13/19 1932 10/13/19 2310 10/14/19 0245  LATICACIDVEN 9.3* 6.1* 4.4* 3.1*    Recent Results (from the past 240 hour(s))  Culture, blood (routine x 2)     Status: None (Preliminary result)   Collection Time: 10/13/19  4:37 PM   Specimen: BLOOD  Result Value Ref Range Status   Specimen Description   Final    BLOOD RIGHT ANTECUBITAL Performed at [facility] [address], [place], [place] [address]    Special Requests   Final    BOTTLES DRAWN AEROBIC ONLY Blood Culture results may not be optimal due to an inadequate volume of blood received in culture bottles Performed at [facility] [address] [place], [place] [address]    Culture   Final    NO GROWTH < 24 HOURS Performed at [facility] [address], [place], [place] [address]    Report Status PENDING  Incomplete  Culture, blood (routine x 2)     Status: None (Preliminary result)   Collection Time: 10/13/19  4:42 PM   Specimen: BLOOD  Result Value Ref Range Status   Specimen Description   Final    BLOOD LEFT ANTECUBITAL Performed at [facility] [address] [place], [place] [address]    Special Requests   Final    BOTTLES DRAWN AEROBIC AND ANAEROBIC Blood Culture results may not be optimal due to an inadequate volume of blood received in culture bottles Performed at [facility] [address] [place], [place] [address]    Culture   Final    NO GROWTH < 24 HOURS Performed at [facility] [address], [place], [place] [address]    Report Status PENDING  Incomplete  Respiratory Panel by RT PCR (Flu A&B, Covid) - Nasopharyngeal Swab     Status: None   Collection Time: 10/13/19  4:50 PM   Specimen: Nasopharyngeal Swab  Result Value Ref Range Status   SARS Coronavirus 2 by RT PCR NEGATIVE NEGATIVE Final    Comment: (NOTE) SARS-CoV-2 target nucleic acids are NOT DETECTED. The SARS-CoV-2 RNA is generally detectable in upper respiratoy specimens during the acute phase of infection. The lowest concentration of SARS-CoV-2 viral copies this assay can detect is 131 copies/mL. A negative result does not preclude SARS-Cov-2 infection and should not be used as the sole basis for treatment or other patient management decisions. A negative result may occur with  improper specimen collection/handling, submission of specimen other than nasopharyngeal swab, presence of viral mutation(s) within the areas targeted by this assay, and inadequate number of viral copies (<131 copies/mL). A negative result must be combined with clinical observations, patient history, and epidemiological information. The expected result is Negative. Fact Sheet for Patients:  PinkCheek.be Fact Sheet for Healthcare Providers:  GravelBags.it This test is not yet ap proved or cleared by the [place] FDA and  has been authorized for detection and/or diagnosis of SARS-CoV-2 by FDA under an Emergency Use Authorization (EUA). This EUA will remain  in effect (meaning this test can be used) for the duration of the COVID-19 declaration under Section 564(b)(1) of the Act, 21 U.S.C. section 360bbb-3(b)(1), unless the authorization is terminated or revoked sooner.    Influenza A by PCR NEGATIVE NEGATIVE Final   Influenza B by PCR  NEGATIVE NEGATIVE Final    Comment: (NOTE) The Xpert Xpress SARS-CoV-2/FLU/RSV assay is intended as an aid in  the diagnosis of  influenza from Nasopharyngeal swab specimens and  should not be used as a sole basis for treatment. Nasal washings and  aspirates are unacceptable for Xpert Xpress SARS-CoV-2/FLU/RSV  testing. Fact Sheet for Patients: PinkCheek.be Fact Sheet for Healthcare Providers: GravelBags.it This test is not yet approved or cleared by the [place] FDA and  has been authorized for detection and/or diagnosis of SARS-CoV-2 by  FDA under an Emergency Use Authorization (EUA). This EUA will remain  in effect (meaning this test can be used) for the duration of the  Covid-19 declaration under Section 564(b)(1) of the Act, 21  U.S.C. section 360bbb-3(b)(1), unless the authorization is  terminated or revoked. Performed at [facility], [place] [address] [place], [place] [address]   MRSA PCR Screening     Status: None   Collection Time: 10/13/19 11:03 PM   Specimen: Nasal Mucosa; Nasopharyngeal  Result Value Ref Range Status   MRSA by PCR NEGATIVE NEGATIVE Final    Comment:        The GeneXpert MRSA Assay (FDA approved for NASAL specimens only), is one component of a comprehensive MRSA colonization surveillance program. It is not intended to diagnose MRSA infection nor to guide or monitor treatment for MRSA infections. Performed at [facility], [place] [address], [place], [place] [address]          Radiology Studies: CT ABDOMEN PELVIS WO CONTRAST  Result Date: 10/13/2019 CLINICAL DATA:  Abdominal pain, fever, rectal bleeding EXAM: CT ABDOMEN AND PELVIS WITHOUT CONTRAST TECHNIQUE: Multidetector CT imaging of the abdomen and pelvis was performed following the standard protocol without IV contrast. COMPARISON:  01/16/2018 FINDINGS: Lower chest: No acute pleural or parenchymal  lung disease. Hepatobiliary: No focal liver abnormality is seen. No gallstones, gallbladder wall thickening, or biliary dilatation. Pancreas: Unremarkable. No pancreatic ductal dilatation or surrounding inflammatory changes. Spleen: Normal in size without focal abnormality. Adrenals/Urinary Tract: No evidence of urinary tract calculi or obstructive uropathy within either kidney. Stable right renal cyst. Bladder is minimally distended which limits evaluation. The adrenals are unremarkable. Stomach/Bowel: No bowel obstruction or ileus. Normal retrocecal appendix. Vascular/Lymphatic: IVC filter is identified. Within the iliac veins and IVC below the level of the filter, there is increased attenuation, venous distension, and mild surrounding fat stranding which may reflect acute thrombus. This would not be unexpected in a patient with a history of DVT who has recently discontinued anticoagulation due to complications. No pathologic adenopathy within the abdomen or pelvis. Reproductive: Mildly enlarged prostate is otherwise unremarkable. Other: No abdominal wall hernia or abnormality. No abdominopelvic ascites. Musculoskeletal: There are no acute or destructive bony lesions. Reconstructed images demonstrate no additional findings. IMPRESSION: 1. IVC filter as above, with increased attenuation and distension of the venous structures inferior to the filter likely representing acute thrombus. This is not unexpected in a patient with a history of DVT who has recently discontinued anticoagulation due to bleeding complications. 2. No evidence of urinary tract calculi or obstructive uropathy. 3. Otherwise unremarkable unenhanced exam. Electronically Signed   By: Randa Ngo M.D.   On: 10/13/2019 20:13   CT Head Wo Contrast  Result Date: 10/13/2019 CLINICAL DATA:  Encephalopathy. Increased weakness and low oxygen levels. Multiple colonoscopies for rectal bleeding. EXAM: CT HEAD WITHOUT CONTRAST TECHNIQUE: Contiguous axial  images were obtained from the base of the skull through the vertex without intravenous contrast. COMPARISON:  09/30/2019 FINDINGS: Brain: No evidence of acute infarction, hemorrhage, hydrocephalus, extra-axial collection or mass lesion/mass effect. Stable appearance of  tentorial and parafalcine calcifications. Vascular: No hyperdense vessel or unexpected calcification. Skull: Normal. Negative for fracture or focal lesion. Sinuses/Orbits: No acute finding. Other: None IMPRESSION: No evidence for acute intracranial abnormality. Electronically Signed   By: Nolon Nations M.D.   On: 10/13/2019 20:14   US RENAL  Result Date: 10/13/2019 CLINICAL DATA:  Acute kidney injury EXAM: RENAL / URINARY TRACT ULTRASOUND COMPLETE COMPARISON:  CT same day FINDINGS: Right Kidney: Renal measurements: 10.3 x 5.3 x 5.5 cm = volume: 155 mL . Echogenicity within normal limits. There is an anechoic cyst seen within the right kidney measuring 4.3 x 4.1 x 4.3 cm. Left Kidney: Renal measurements: 10.3 x 5 x 5 x 4.6 cm = volume: 131 mL. Echogenicity within normal limits. No mass or hydronephrosis visualized. Bladder: Appears normal for degree of bladder distention. Other: None. IMPRESSION: Right anechoic cyst measuring 4.3 x 4.1 x 4.3 cm. Otherwise normal renal ultrasound. Electronically Signed   By: Prudencio Pair M.D.   On: 10/13/2019 22:52   DG CHEST PORT 1 VIEW  Result Date: 10/14/2019 CLINICAL DATA:  Short of breath EXAM: PORTABLE CHEST 1 VIEW COMPARISON:  10/13/2019 FINDINGS: The heart size and mediastinal contours are within normal limits. Both lungs are clear. The visualized skeletal structures are unremarkable. IMPRESSION: No active disease. Electronically Signed   By: Kerby Moors M.D.   On: 10/14/2019 10:25   DG Chest Portable 1 View  Result Date: 10/13/2019 CLINICAL DATA:  Shortness of breath, increased weakness and shortness of breath. EXAM: PORTABLE CHEST 1 VIEW COMPARISON:  Chest radiograph 09/30/2019 FINDINGS: Heart  size within normal limits. Aortic atherosclerosis. There is no evidence of airspace consolidation. No pleural effusion or pneumothorax. No acute bony abnormality. IMPRESSION: No evidence of acute cardiopulmonary abnormality. Aortic atherosclerosis. Electronically Signed   By: Kellie Simmering DO   On: 10/13/2019 17:02        Scheduled Meds:  atorvastatin  10 mg Oral QPM   Chlorhexidine Gluconate Cloth  6 each Topical Daily   insulin aspart  0-15 Units Subcutaneous TID WC   insulin aspart  0-5 Units Subcutaneous QHS   vitamin B-12  100 mcg Oral Daily   Continuous Infusions:  sodium chloride 125 mL/hr at 10/14/19 0308   ceFEPime (MAXIPIME) IV     metronidazole 500 mg (10/14/19 1337)   [START ON 10/15/2019] vancomycin       LOS: 1 day    Time spent: 45 minutes    Irine Seal, MD [facility]   To contact the attending provider between 7A-7P or the covering provider during after hours 7P-7A, please log into the web site [email or web address] and access using universal [place] password for that web site. If you do not have the password, please call the hospital operator.  10/14/2019, 3:22 PM

## 2019-10-14 NOTE — Consult Note (Signed)
Referring Provider:  Dr. Irine Seal Primary Care Physician:  Jani Gravel, MD Primary Gastroenterologist:  Dr. Paulita Fujita  Reason for Consultation: Anemia, drop in hemoglobin, history of GI bleeding  HPI: Matthew Zimmerman is a 74 y.o. male previously admitted to the hospital in mid January, approximately 3 weeks ago, with hematochezia.  No discrete source of bleeding was identified, but he was found to have cecal and transverse colonic nonbleeding medium-sized AVMs, and given the absence of alternative sources of bleeding, these underwent argon plasma coagulation therapy.    At that time, because of a history of pulmonary embolism, the patient had been on Xarelto which was resumed a couple of days following that procedure, but a week later, the patient was back in with recurrent hematochezia.  Repeat colonoscopy at that time showed ulceration at the [facility] sites, without bleeding or obvious stigmata of hemorrhage, but clips were applied and he was sent home, with a vena caval filter, no longer on Xarelto.  However, the patient is on a daily 81 mg aspirin, without PPI coverage.    With all that background, the patient was admitted to the emergency room yesterday in transfer from his primary physician's office because of shortness of breath.  In the ER, he was found to have altered mental status, acute kidney injury and tachycardia as well as tachypnea, and there was concern of sepsis for which he has been placed on antibiotic therapy.    Of concern, and the reason for consultation to our service, is a significant drop in hemoglobin since admission.  He left the hospital on January 31 with a hemoglobin of 9.3, which had risen to 9.8 at time of presentation (?  Volume contraction) and since then it has dropped progressively from 7.6 to 7.2.  Meanwhile, the patient relates a history of rather diffuse mid abdominal pain culminating in nausea and protracted vomiting (apparently nonbloody, perhaps minimally  bloody, details are unclear) a couple of days prior to admission.  That pain has resolved and he is no longer having any nausea or vomiting.  In fact, he is currently tolerating a solid diet without problems.   Past Medical History:  Diagnosis Date  . Carpal tunnel syndrome on both sides   . Chest pain   . Diabetes mellitus without complication ([place])   . GERD (gastroesophageal reflux disease)   . Headache   . Hypercholesteremia   . Hyperlipidemia   . PE (pulmonary embolism)   . Shortness of breath dyspnea    PE     Past Surgical History:  Procedure Laterality Date  . BILATERAL CARPAL TUNNEL RELEASE Bilateral   . COLONOSCOPY    . COLONOSCOPY WITH PROPOFOL Left 09/20/2019   Procedure: COLONOSCOPY WITH PROPOFOL;  Surgeon: Ronnette Juniper, MD;  Location: [facility];  Service: Gastroenterology;  Laterality: Left;  . COLONOSCOPY WITH PROPOFOL N/A 10/03/2019   Procedure: COLONOSCOPY WITH PROPOFOL;  Surgeon: Wilford Corner, MD;  Location: [facility];  Service: Endoscopy;  Laterality: N/A;  . ELBOW SURGERY     BONE SPURS REMOVED B ELBOWS  . EYE SURGERY Bilateral    cataracts  . FOOT SURGERY     HEEL SPURS REMOVED B HEELS  . HEMOSTASIS CLIP PLACEMENT  09/20/2019   Procedure: HEMOSTASIS CLIP PLACEMENT;  Surgeon: Ronnette Juniper, MD;  Location: [facility];  Service: Gastroenterology;;  . HEMOSTASIS CLIP PLACEMENT  10/03/2019   Procedure: HEMOSTASIS CLIP PLACEMENT;  Surgeon: Wilford Corner, MD;  Location: [facility];  Service: Endoscopy;;  . HOT HEMOSTASIS N/A  09/20/2019   Procedure: HOT HEMOSTASIS (ARGON PLASMA COAGULATION/BICAP);  Surgeon: Ronnette Juniper, MD;  Location: [facility];  Service: Gastroenterology;  Laterality: N/A;  . I & D EXTREMITY Right 09/18/2014   Procedure: IRRIGATION AND DEBRIDEMENT RIGHT THUMB, open fracture repair;  Surgeon: Linna Hoff, MD;  Location: [place];  Service: Orthopedics;  Laterality: Right;  . IR IVC FILTER PLMT / S&I Burke Keels GUID/MOD SED  10/03/2019  . LEG  SURGERY Left    left thigh table saw accident, "cut to the bone", I&D/suturing  . SHOULDER SURGERY Left    Bone spur  . SPINE SURGERY  09/04/1985   lumbar spine  . SUBMUCOSAL INJECTION  10/03/2019   Procedure: SUBMUCOSAL INJECTION;  Surgeon: Wilford Corner, MD;  Location: [facility];  Service: Endoscopy;;  . TOE SURGERY      Prior to Admission medications   Medication Sig Start Date End Date Taking? Authorizing Provider  aspirin EC 81 MG tablet Take 81 mg by mouth every evening.    Yes [provider]  atorvastatin (LIPITOR) 20 MG tablet Take 10 mg by mouth every evening.    Yes [provider]  augmented betamethasone dipropionate (DIPROLENE-AF) 0.05 % cream Apply 1 application topically daily as needed (itchy skin near bottom).  10/28/18  Yes [provider]  clotrimazole-betamethasone (LOTRISONE) cream Apply 1 application topically 2 (two) times daily as needed (itching).  07/11/19  Yes [provider]  dapagliflozin propanediol (FARXIGA) 5 MG TABS tablet Take 5 mg by mouth daily.   Yes Jani Gravel, MD  diclofenac sodium (VOLTAREN) 1 % GEL Apply 1 application topically 4 (four) times daily as needed (pain).   Yes [provider]  insulin NPH-regular Human (NOVOLIN 70/30) (70-30) 100 UNIT/ML injection Inject 25-35 Units into the skin 2 (two) times daily with a meal. Humulin   Yes [provider]  metFORMIN (GLUCOPHAGE) 500 MG tablet Take 1,000 mg by mouth 2 (two) times daily with a meal.  01/30/19  Yes [provider]  metoprolol succinate (TOPROL-XL) 25 MG 24 hr tablet Take 25 mg by mouth daily.   Yes [provider]  vitamin B-12 (CYANOCOBALAMIN) 100 MCG tablet Take 100 mcg by mouth daily.   Yes [provider]  Vitamin D, Ergocalciferol, (DRISDOL) 1.25 MG (50000 UNIT) CAPS capsule Take 50,000 Units by mouth every 7 (seven) days.   Yes [provider]  BD INSULIN SYRINGE U/F 31G X 5/16" 0.5 ML MISC 2  (two) times daily. 07/30/19   [provider]  glucose blood (ONE TOUCH ULTRA TEST) test strip Use to test blood sugar twice a day 12/12/18   [provider]  Lancets [facility] DELICA PLUS [id number]) [place] Use to test blood sugar twice daily 12/12/18   [provider]  metoprolol succinate (TOPROL-XL) 25 MG 24 hr tablet Take 1 tablet (25 mg total) by mouth daily. Take with or immediately following a meal. Take at night. Patient taking differently: Take 25 mg by mouth daily. Take with or immediately following a meal. 06/17/19 09/30/19  Miquel Dunn, NP    Current Facility-Administered Medications  Medication Dose Route Frequency Provider Last Rate Last Admin  . 0.9 %  sodium chloride infusion (Manually program via Guardrails IV Fluids)   Intravenous Once Eugenie Filler, MD      . 0.9 %  sodium chloride infusion   Intravenous Continuous Shela Leff, MD 125 mL/hr at 10/14/19 0308 Restarted at 10/14/19 0308  . acetaminophen (TYLENOL) tablet 650 mg  650 mg Oral Q6H PRN Shela Leff, MD       Or  . acetaminophen (TYLENOL) suppository 650 mg  650 mg Rectal Q6H PRN Shela Leff, MD      . atorvastatin (LIPITOR) tablet 10 mg  10 mg Oral QPM Shela Leff, MD   10 mg at 10/14/19 1642  . ceFEPIme (MAXIPIME) 2 g in sodium chloride 0.9 % 100 mL IVPB  2 g Intravenous Q24H Shela Leff, MD   Stopped at 10/14/19 1713  . Chlorhexidine Gluconate Cloth 2 % PADS 6 each  6 each Topical Daily Tri[facility]osp, Wladmits, MD      . insulin aspart (novoLOG) injection 0-15 Units  0-15 Units Subcutaneous TID WC Shela Leff, MD   2 Units at 10/14/19 1641  . insulin aspart (novoLOG) injection 0-5 Units  0-5 Units Subcutaneous QHS Shela Leff, MD      . metroNIDAZOLE (FLAGYL) IVPB 500 mg  500 mg Intravenous Q8H Shela Leff, MD   Stopped at 10/14/19 1437  . morphine 2 MG/ML injection 1 mg  1 mg Intravenous Q4H PRN Shela Leff, MD   1 mg at  10/14/19 1855  . ondansetron (ZOFRAN) injection 4 mg  4 mg Intravenous Q6H PRN Shela Leff, MD      . pantoprazole (PROTONIX) injection 40 mg  40 mg Intravenous Q12H Lugene Beougher, MD      . Derrill Memo ON 10/15/2019] vancomycin (VANCOREADY) IVPB 1500 mg/300 mL  1,500 mg Intravenous Q48H Shela Leff, MD      . vitamin B-12 (CYANOCOBALAMIN) tablet 100 mcg  100 mcg Oral Daily Shela Leff, MD   100 mcg at 10/14/19 1014    Allergies as of 10/13/2019 - Review Complete 10/13/2019  Allergen Reaction Noted  . Cortisone Other (See Comments)     Family History  Problem Relation Age of Onset  . Cancer Mother   . Diabetes Father     Social History   Socioeconomic History  . Marital status: Married    Spouse name: Not on file  . Number of children: 2  . Years of education: Not on file  . Highest education level: Not on file  Occupational History  . Not on file  Tobacco Use  . Smoking status: Former Smoker    Packs/day: 0.50    Types: Cigarettes    Quit date: 1964    Years since quitting: 57.1  . Smokeless tobacco: Former Systems developer  . Tobacco comment: smoking since 74 years old  Substance and Sexual Activity  . Alcohol use: No  . Drug use: No  . Sexual activity: Not on file  Other Topics Concern  . Not on file  Social History Narrative   Lives with wife.[name]   He is retired from [employer], also disabled   [name] son age 77 years old died suddenly at client's home on 11/03/18- results of autopsy still pending on 03/20/19   Social Determinants of Health   Financial Resource Strain: Low Risk   . Difficulty of Paying Living Expenses: Not very hard  Food Insecurity: No Food Insecurity  . Worried About Charity fundraiser in the Last Year: Never true  . Ran Out of Food in the Last Year: Never true  Transportation Needs: No Transportation Needs  . Lack of Transportation (Medical): No  . Lack of Transportation (Non-Medical): No  Physical Activity: Inactive  . Days of  Exercise per Week: 0 days  . Minutes of Exercise per Session: 0 min  Stress: No Stress Concern  Present  . Feeling of Stress : Only a little  Social Connections: Unknown  . Frequency of Communication with Friends and Family: More than three times a week  . Frequency of Social Gatherings with Friends and Family: Not on file  . Attends Religious Services: Not on file  . Active Member of Clubs or Organizations: Not on file  . Attends Archivist Meetings: Not on file  . Marital Status: Married  Human resources officer Violence:   . Fear of Current or Ex-Partner: Not on file  . Emotionally Abused: Not on file  . Physically Abused: Not on file  . Sexually Abused: Not on file    Review of Systems: No chest pain, but significant shortness of breath  Physical Exam: Vital signs in last 24 hours: Temp:  [96.6 F (35.9 C)-99.2 F (37.3 C)] 98.3 F (36.8 C) (02/09 1700) Pulse Rate:  [45-88] 45 (02/09 1900) Resp:  [14-32] 14 (02/09 1900) BP: (99-158)/(41-94) 137/62 (02/09 1800) SpO2:  [100 %] 100 % (02/09 1900) Last BM Date: 10/12/19 General:   Alert,  Well-developed, well-nourished, pleasant and cooperative in NAD Head:  Normocephalic and atraumatic. Eyes:  Sclera clear, no icterus.   Conjunctiva pale. Mouth:   No ulcerations or lesions.  Oropharynx pink but dry. Lungs:  Clear throughout to auscultation.   No wheezes, crackles, or rhonchi. No evident respiratory distress. Heart:   Regular rate and rhythm; no murmurs, clicks, rubs,  or gallops. Abdomen:  Soft, nontender, nontympanitic, and nondistended. No masses, hepatosplenomegaly or ventral hernias noted. Normal bowel sounds, without bruits, guarding, or rebound.   Rectal: No rectal masses, no prostatic nodules, soft brown stool present (moderate amount), submitted to the lab for Hemoccult testing. Msk:   Mild diffuse edema of the right lower extremity consistent with history of vena caval filter Pulses:  Normal radial pulse is  noted. Extremities: Both lower extremities appear somewhat puffy, right more so than left Neurologic:  Alert and coherent;  grossly normal neurologically, not overtly encephalopathic Skin: No rashes. Duoderm on mid-back Psych:   Alert and cooperative. Normal mood and affect.  Intake/Output from previous day: 02/08 0701 - 02/09 0700 In: 5169.4 [I.V.:491.7; IV Piggyback:4677.7] Out: 750 [Urine:750] Intake/Output this shift: No intake/output data recorded.  Lab Results: Recent Labs    10/13/19 1636 10/14/19 0245 10/14/19 1523  WBC 18.8* 12.4* 10.4  HGB 9.8* 7.6* 7.2*  HCT 32.6* 25.0* 23.5*  PLT 233 173 188   BMET Recent Labs    10/13/19 1636 10/13/19 2310 10/14/19 0245  NA 136 138 140  K 4.1 5.1 4.1  CL 100 108 111  CO2 13* 15* 16*  GLUCOSE 314* 145* 101*  BUN 52* 49* 49*  CREATININE 3.56* 2.83* 2.64*  CALCIUM 9.3 8.3* 7.9*   LFT Recent Labs    10/13/19 1636  PROT 8.4*  ALBUMIN 4.0  AST 23  ALT 15  ALKPHOS 78  BILITOT 1.3*   PT/INR Recent Labs    10/13/19 1636  LABPROT 16.4*  INR 1.3*    Studies/Results: CT ABDOMEN PELVIS WO CONTRAST  Result Date: 10/13/2019 CLINICAL DATA:  Abdominal pain, fever, rectal bleeding EXAM: CT ABDOMEN AND PELVIS WITHOUT CONTRAST TECHNIQUE: Multidetector CT imaging of the abdomen and pelvis was performed following the standard protocol without IV contrast. COMPARISON:  01/16/2018 FINDINGS: Lower chest: No acute pleural or parenchymal lung disease. Hepatobiliary: No focal liver abnormality is seen. No gallstones, gallbladder wall thickening, or biliary dilatation. Pancreas: Unremarkable. No pancreatic ductal dilatation or surrounding inflammatory  changes. Spleen: Normal in size without focal abnormality. Adrenals/Urinary Tract: No evidence of urinary tract calculi or obstructive uropathy within either kidney. Stable right renal cyst. Bladder is minimally distended which limits evaluation. The adrenals are unremarkable. Stomach/Bowel:  No bowel obstruction or ileus. Normal retrocecal appendix. Vascular/Lymphatic: IVC filter is identified. Within the iliac veins and IVC below the level of the filter, there is increased attenuation, venous distension, and mild surrounding fat stranding which may reflect acute thrombus. This would not be unexpected in a patient with a history of DVT who has recently discontinued anticoagulation due to complications. No pathologic adenopathy within the abdomen or pelvis. Reproductive: Mildly enlarged prostate is otherwise unremarkable. Other: No abdominal wall hernia or abnormality. No abdominopelvic ascites. Musculoskeletal: There are no acute or destructive bony lesions. Reconstructed images demonstrate no additional findings. IMPRESSION: 1. IVC filter as above, with increased attenuation and distension of the venous structures inferior to the filter likely representing acute thrombus. This is not unexpected in a patient with a history of DVT who has recently discontinued anticoagulation due to bleeding complications. 2. No evidence of urinary tract calculi or obstructive uropathy. 3. Otherwise unremarkable unenhanced exam. Electronically Signed   By: Randa Ngo M.D.   On: 10/13/2019 20:13   CT Head Wo Contrast  Result Date: 10/13/2019 CLINICAL DATA:  Encephalopathy. Increased weakness and low oxygen levels. Multiple colonoscopies for rectal bleeding. EXAM: CT HEAD WITHOUT CONTRAST TECHNIQUE: Contiguous axial images were obtained from the base of the skull through the vertex without intravenous contrast. COMPARISON:  09/30/2019 FINDINGS: Brain: No evidence of acute infarction, hemorrhage, hydrocephalus, extra-axial collection or mass lesion/mass effect. Stable appearance of tentorial and parafalcine calcifications. Vascular: No hyperdense vessel or unexpected calcification. Skull: Normal. Negative for fracture or focal lesion. Sinuses/Orbits: No acute finding. Other: None IMPRESSION: No evidence for acute  intracranial abnormality. Electronically Signed   By: Nolon Nations M.D.   On: 10/13/2019 20:14   US RENAL  Result Date: 10/13/2019 CLINICAL DATA:  Acute kidney injury EXAM: RENAL / URINARY TRACT ULTRASOUND COMPLETE COMPARISON:  CT same day FINDINGS: Right Kidney: Renal measurements: 10.3 x 5.3 x 5.5 cm = volume: 155 mL . Echogenicity within normal limits. There is an anechoic cyst seen within the right kidney measuring 4.3 x 4.1 x 4.3 cm. Left Kidney: Renal measurements: 10.3 x 5 x 5 x 4.6 cm = volume: 131 mL. Echogenicity within normal limits. No mass or hydronephrosis visualized. Bladder: Appears normal for degree of bladder distention. Other: None. IMPRESSION: Right anechoic cyst measuring 4.3 x 4.1 x 4.3 cm. Otherwise normal renal ultrasound. Electronically Signed   By: Prudencio Pair M.D.   On: 10/13/2019 22:52   VAS [name] IVC/ILIAC (VENOUS ONLY)  Result Date: 10/14/2019 IVC/ILIAC STUDY Other Factors: Bilateral lower extremity DVT. IVC filter, history of PE.  Comparison Study: No prior study. Performing Technologist: Maudry Mayhew MHA, RDMS, RVT, RDCS  Examination Guidelines: A complete evaluation includes B-mode imaging, spectral Doppler, color Doppler, and power Doppler as needed of all accessible portions of each vessel. Bilateral testing is considered an integral part of a complete examination. Limited examinations for reoccurring indications may be performed as noted.  Abdominal Aorta Findings: +--------+-------+----------+----------+--------+--------+--------+ LocationAP (cm)Trans (cm)PSV (cm/s)WaveformThrombusComments +--------+-------+----------+----------+--------+--------+--------+ Distal  3.76             90                                 +--------+-------+----------+----------+--------+--------+--------+  IVC/Iliac Findings: +----------+------+--------+--------------+    IVC    PatentThrombus   Comments    +----------+------+--------+--------------+ IVC Prox                 Not visualized +----------+------+--------+--------------+ IVC Mid          acute                 +----------+------+--------+--------------+ IVC Distal       acute                 +----------+------+--------+--------------+  +-------------------+---------+-----------+---------+-----------+--------+         CIV        RT-PatentRT-ThrombusLT-PatentLT-ThrombusComments +-------------------+---------+-----------+---------+-----------+--------+ Common Iliac Distal   No       acute      No       acute            +-------------------+---------+-----------+---------+-----------+--------+  +-------------------------+---------+-----------+---------+-----------+--------+            EIV           RT-PatentRT-ThrombusLT-PatentLT-ThrombusComments +-------------------------+---------+-----------+---------+-----------+--------+ External Iliac Vein         No       acute      No       acute            Distal                                                                    +-------------------------+---------+-----------+---------+-----------+--------+   Summary: Abdominal Aorta: There is evidence of abnormal dilatation of the distal Abdominal aorta.  IVC/Iliac: There is evidence of acute thrombus involving the IVC. There is evidence of acute thrombus involving the right common iliac vein, left common iliac vein, right external iliac vein, left external iliac vein. Visualization of proximal Inferior Vena Cava was limited.  *See table(s) above for measurements and observations.   Preliminary    DG CHEST PORT 1 VIEW  Result Date: 10/14/2019 CLINICAL DATA:  Short of breath EXAM: PORTABLE CHEST 1 VIEW COMPARISON:  10/13/2019 FINDINGS: The heart size and mediastinal contours are within normal limits. Both lungs are clear. The visualized skeletal structures are unremarkable. IMPRESSION: No active disease. Electronically Signed   By: Kerby Moors M.D.   On: 10/14/2019 10:25   DG  Chest Portable 1 View  Result Date: 10/13/2019 CLINICAL DATA:  Shortness of breath, increased weakness and shortness of breath. EXAM: PORTABLE CHEST 1 VIEW COMPARISON:  Chest radiograph 09/30/2019 FINDINGS: Heart size within normal limits. Aortic atherosclerosis. There is no evidence of airspace consolidation. No pleural effusion or pneumothorax. No acute bony abnormality. IMPRESSION: No evidence of acute cardiopulmonary abnormality. Aortic atherosclerosis. Electronically Signed   By: Kellie Simmering DO   On: 10/13/2019 17:02   VAS [name] LOWER EXTREMITY VENOUS (DVT)  Result Date: 10/14/2019  Lower Venous DVTStudy Indications: Swelling, and IVC filter. History of PE.  Comparison Study: No prior study Performing Technologist: Maudry Mayhew MHA, RDMS, RVT, RDCS  Examination Guidelines: A complete evaluation includes B-mode imaging, spectral Doppler, color Doppler, and power Doppler as needed of all accessible portions of each vessel. Bilateral testing is considered an integral part of a complete examination. Limited examinations for reoccurring indications may be performed as noted. The reflux portion of the exam is performed with the  patient in reverse Trendelenburg.  +---------+---------------+---------+-----------+----------+--------------+ RIGHT    CompressibilityPhasicitySpontaneityPropertiesThrombus Aging +---------+---------------+---------+-----------+----------+--------------+ CFV      None                    No                   Acute          +---------+---------------+---------+-----------+----------+--------------+ SFJ      None                                         Acute          +---------+---------------+---------+-----------+----------+--------------+ FV Prox  None                                         Acute          +---------+---------------+---------+-----------+----------+--------------+ FV Mid   None                                         Acute           +---------+---------------+---------+-----------+----------+--------------+ FV DistalNone                                         Acute          +---------+---------------+---------+-----------+----------+--------------+ PFV      None                                         Acute          +---------+---------------+---------+-----------+----------+--------------+ POP      None                    No                   Acute          +---------+---------------+---------+-----------+----------+--------------+ PTV      None                    No                   Acute          +---------+---------------+---------+-----------+----------+--------------+ PERO     None                    No                   Acute          +---------+---------------+---------+-----------+----------+--------------+ Gastroc  None                    No                   Acute          +---------+---------------+---------+-----------+----------+--------------+   +---------+---------------+---------+-----------+----------+--------------+ LEFT     CompressibilityPhasicitySpontaneityPropertiesThrombus Aging +---------+---------------+---------+-----------+----------+--------------+ CFV      None  No                   Acute          +---------+---------------+---------+-----------+----------+--------------+ SFJ      None                                         Acute          +---------+---------------+---------+-----------+----------+--------------+ FV Prox  None                                         Acute          +---------+---------------+---------+-----------+----------+--------------+ FV Mid   None                                         Acute          +---------+---------------+---------+-----------+----------+--------------+ FV DistalNone                                         Acute           +---------+---------------+---------+-----------+----------+--------------+ PFV      None                                         Acute          +---------+---------------+---------+-----------+----------+--------------+ POP      None                    No                   Acute          +---------+---------------+---------+-----------+----------+--------------+ Gastroc  None                    No                   Acute          +---------+---------------+---------+-----------+----------+--------------+   Left Technical Findings: Not visualized segments include Left posterior tibial and peroneal veins.   Summary: RIGHT: - Findings consistent with acute deep vein thrombosis involving the right common femoral vein, SF junction, right femoral vein, right proximal profunda vein, right popliteal vein, right posterior tibial veins, right peroneal veins, and right gastrocnemius veins. - No cystic structure found in the popliteal fossa.  LEFT: - Findings consistent with acute deep vein thrombosis involving the left common femoral vein, SF junction, left femoral vein, left proximal profunda vein, left popliteal vein, and left gastrocnemius veins. - No cystic structure found in the popliteal fossa.  *See table(s) above for measurements and observations.    Preliminary     Impression: 1.  Moderate decrease in hemoglobin over the past couple of weeks, without overt GI bleeding 2.  Recent episode of mid abdominal pain and vomiting, now resolved, CT negative for source 3.  Presentation of dyspnea and possible sepsis with initial leukocytosis and elevated lactate level, improving on antibiotic therapy, blood cultures negative 4.  Recent admission  for hematochezia attributed to colonic AVMs (alternative site not identified), with subsequent readmission a week later for recurrent hematochezia (on Xarelto at the time) with clean-based ulcerations present at the [facility] sites 5.  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  Empiric PPI therapy in view of recent nausea and vomiting and history of daily aspirin exposure without PPI coverage 2.  Await stool Hemoccult.  However, based on the gross appearance of the stool (medium brown color), it does not appear this patient is having any substantial, acute, or subacute blood loss from the GI tract. 3.  For now, I do not think the patient needs endoscopic evaluation or repeat colonoscopy.   LOS: 1 day   Youlanda Mighty Rashaud Ybarbo  10/14/2019, 8:00 PM   Pager [phone] If no answer or after 5 PM call [phone]

## 2019-10-14 NOTE — Progress Notes (Signed)
Pharmacy Antibiotic Note  Matthew Zimmerman is a 74 y.o. male admitted on 10/13/2019 with sepsis.  Pharmacy has been consulted for Vancomycin, cefepime dosing.  Plan: Vancomycin 1.75 gm iv x1, then Vancomycin 1500 mg IV Q 48 hrs. Goal AUC 400-550. Expected AUC: 531 SCr used: 2.83  Cefepime 2gm iv x1, then 2gm iv q24hr   Height: 5\' 8"  (172.7 cm) Weight: 189 lb 9.5 oz (86 kg) IBW/kg (Calculated) : 68.4  Temp (24hrs), Avg:97.6 F (36.4 C), Min:96 F (35.6 C), Max:99.2 F (37.3 C)  Recent Labs  Lab 10/13/19 1636 10/13/19 1932 10/13/19 2310  WBC 18.8*  --   --   CREATININE 3.56*  --  2.83*  LATICACIDVEN 9.3* 6.1* 4.4*    Estimated Creatinine Clearance: 24.4 mL/min (A) (by C-G formula based on SCr of 2.83 mg/dL (H)).    Allergies  Allergen Reactions  . Cortisone Other (See Comments)    CBG thrown out of control    Antimicrobials this admission: Vancomycin 10/13/2019 >> Cefepime 10/13/2019 >>    Dose adjustments this admission: -  Microbiology results: -  Thank you for allowing pharmacy to be a part of this patient's care.  Nani Skillern Crowford 10/14/2019 3:13 AM

## 2019-10-14 NOTE — Patient Outreach (Addendum)
  [facility]) Care Management Chronic Special Needs Program    10/14/2019  Name: Matthew Zimmerman, DOB: 24-Jul-1946  MRN: [id number]   [name] is enrolled in a chronic special needs plan for Diabetes. Matthew Zimmerman is a 74 y.o. male with PMH of HLD, recent placement of  IVC filter, DM, GERD, and recent admit for acute blood loss anemia due to lower GI bleed likely secondary to Xarelto. Patient arrived by private vehicle driven by his son from client's primary care provider's office after low O2 levels were found at his primary care provider's office on 10/13/19. He presented to the [facility]  emergency department with fatigue, increased weakness, no bowel movements and encephalopathy. Admitting diagnoses were: severe sepsis, acute kidney injury, metabolic acidosis, and acute metabolic encephalopathy. This is the third hospital admission for Matthew Zimmerman since 09/18/19. The previous  hospitalizations were related to acute lower GI bleeds.   Plan: Individualized care plan will be sent to [facility] Utilization Management Department . RNCM will monitor progress and assist with transition of care process.   Kelli Churn RN, CCM, [place] Management Coordinator [facility] Care Management [phone]

## 2019-10-14 NOTE — Progress Notes (Signed)
Patient currently refusing strep swab. Will check back with patient to see if he is agreeable later.

## 2019-10-15 LAB — URINE CULTURE: Culture: NO GROWTH

## 2019-10-15 LAB — CBC WITH DIFFERENTIAL/PLATELET
Abs Immature Granulocytes: 0.14 10*3/uL — ABNORMAL HIGH (ref 0.00–0.07)
Basophils Absolute: 0.1 10*3/uL (ref 0.0–0.1)
Basophils Absolute: 0.1 10*3/uL (ref 0.0–0.1)
Basophils Relative: 1 %
Basophils Relative: 1 %
Eosinophils Absolute: 0.1 10*3/uL (ref 0.0–0.5)
Eosinophils Absolute: 0.1 10*3/uL (ref 0.0–0.5)
Eosinophils Relative: 1 %
Eosinophils Relative: 1 %
HCT: 23.5 % — ABNORMAL LOW (ref 39.0–52.0)
HCT: 30.9 % — ABNORMAL LOW (ref 39.0–52.0)
Hemoglobin: 7 g/dL — ABNORMAL LOW (ref 13.0–17.0)
Hemoglobin: 9.4 g/dL — ABNORMAL LOW (ref 13.0–17.0)
Immature Granulocytes: 1 %
Immature Granulocytes: 1 %
Lymphocytes Relative: 21 %
Lymphocytes Relative: 17 %
Lymphs Abs: 2.1 10*3/uL (ref 0.7–4.0)
Lymphs Abs: 1.8 10*3/uL (ref 0.7–4.0)
MCH: 26.8 pg (ref 26.0–34.0)
MCHC: 29.8 g/dL — ABNORMAL LOW (ref 30.0–36.0)
MCHC: 28.1 g/dL — ABNORMAL LOW (ref 30.0–36.0)
MCV: 90 fL (ref 80.0–100.0)
MCV: 92.5 fL (ref 80.0–100.0)
Monocytes Absolute: 1.1 10*3/uL — ABNORMAL HIGH (ref 0.1–1.0)
Monocytes Absolute: 1 10*3/uL (ref 0.1–1.0)
Monocytes Relative: 11 %
Monocytes Relative: 10 %
Neutro Abs: 6.5 10*3/uL (ref 1.7–7.7)
Neutro Abs: 7.3 10*3/uL (ref 1.7–7.7)
Neutrophils Relative %: 65 %
Neutrophils Relative %: 70 %
Platelets: 204 10*3/uL (ref 150–400)
Platelets: 206 10*3/uL (ref 150–400)
RBC: 2.61 MIL/uL — ABNORMAL LOW (ref 4.22–5.81)
RBC: 3.34 MIL/uL — ABNORMAL LOW (ref 4.22–5.81)
RDW: 18.2 % — ABNORMAL HIGH (ref 11.5–15.5)
RDW: 16.7 % — ABNORMAL HIGH (ref 11.5–15.5)
WBC: 10 10*3/uL (ref 4.0–10.5)
WBC: 10.4 10*3/uL (ref 4.0–10.5)
nRBC: 0.3 % — ABNORMAL HIGH (ref 0.0–0.2)
nRBC: 0 /100 WBC
nRBC: 0.4 % — ABNORMAL HIGH (ref 0.0–0.2)

## 2019-10-15 LAB — GLUCOSE, CAPILLARY
Glucose-Capillary: 166 mg/dL — ABNORMAL HIGH (ref 70–99)
Glucose-Capillary: 156 mg/dL — ABNORMAL HIGH (ref 70–99)
Glucose-Capillary: 166 mg/dL — ABNORMAL HIGH (ref 70–99)
Glucose-Capillary: 211 mg/dL — ABNORMAL HIGH (ref 70–99)

## 2019-10-15 LAB — TYPE AND SCREEN
ABO/RH(D): A POS
Antibody Screen: POSITIVE
DAT, IgG: POSITIVE

## 2019-10-15 LAB — BASIC METABOLIC PANEL
Anion gap: 9 (ref 5–15)
BUN: 40 mg/dL — ABNORMAL HIGH (ref 8–23)
CO2: 17 mmol/L — ABNORMAL LOW (ref 22–32)
Calcium: 8.3 mg/dL — ABNORMAL LOW (ref 8.9–10.3)
Chloride: 112 mmol/L — ABNORMAL HIGH (ref 98–111)
Creatinine, Ser: 1.71 mg/dL — ABNORMAL HIGH (ref 0.61–1.24)
GFR calc Af Amer: 45 mL/min — ABNORMAL LOW (ref >60)
GFR calc non Af Amer: 39 mL/min — ABNORMAL LOW (ref >60)
Glucose, Bld: 149 mg/dL — ABNORMAL HIGH (ref 70–99)
Potassium: 4.1 mmol/L (ref 3.5–5.1)
Sodium: 138 mmol/L (ref 135–145)

## 2019-10-15 LAB — PROCALCITONIN: Procalcitonin: 0.5 ng/mL

## 2019-10-15 LAB — LACTIC ACID, PLASMA: Lactic Acid, Venous: 0.8 mmol/L (ref 0.5–1.9)

## 2019-10-15 MED ORDER — APIXABAN 5 MG PO TABS: 5.0000 mg | ORAL_TABLET | Freq: Two times a day (BID) | ORAL | Status: DC

## 2019-10-15 MED ORDER — APIXABAN 5 MG PO TABS
10.0000 mg | ORAL_TABLET | Freq: Two times a day (BID) | ORAL | Status: DC
  Administered 2019-10-15 – 2019-10-17 (×4): 10 mg via ORAL
  Filled 2019-10-15 (×4): qty 2

## 2019-10-15 MED ORDER — SODIUM CHLORIDE 0.9 % IV SOLN
2.0000 g | Freq: Two times a day (BID) | INTRAVENOUS | Status: DC
  Administered 2019-10-15 – 2019-10-17 (×5): 2 g via INTRAVENOUS
  Filled 2019-10-15 (×5): qty 2

## 2019-10-15 MED ORDER — VANCOMYCIN HCL IN DEXTROSE 1-5 GM/200ML-% IV SOLN
1000.0000 mg | INTRAVENOUS | Status: DC
  Administered 2019-10-15: 1000 mg via INTRAVENOUS
  Filled 2019-10-15: qty 200

## 2019-10-15 MED ORDER — ASPIRIN EC 81 MG PO TBEC: 81.0000 mg | DELAYED_RELEASE_TABLET | Freq: Every evening | ORAL | Status: DC

## 2019-10-15 NOTE — Progress Notes (Signed)
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  He was started on vancomycin, cefepime and metronidazole on admission for suspected sepsis.  Today, 10/15/2019: - day #3 abx - afeb, wbc wnl - scr trending down with 1.71 today (crcl~40) - PCT 0.50 - all cultures have been negative thus far  Plan: - adjust cefepime dose to 2gm IV q12h and vancomycin to 1000 mg IV q24h for est AUC 463 - continue flagyl 500 mg IV q8h - monitor renal function closely  ________________________________  Height: 5\' 8"  (172.7 cm) Weight: 189 lb 9.5 oz (86 kg) IBW/kg (Calculated) : 68.4  Temp (24hrs), Avg:98.2 F (36.8 C), Min:97.5 F (36.4 C), Max:98.7 F (37.1 C)  Recent Labs  Lab 10/13/19 1636 10/13/19 1932 10/13/19 2310 10/14/19 0245 10/14/19 1523 10/15/19 0217  WBC 18.8*  --   --  12.4* 10.4 10.0  CREATININE 3.56*  --  2.83* 2.64*  --  1.71*  LATICACIDVEN 9.3* 6.1* 4.4* 3.1*  --  0.8    Estimated Creatinine Clearance: 40.4 mL/min (A) (by C-G formula based on SCr of 1.71 mg/dL (H)).    Allergies  Allergen Reactions  . Cortisone Other (See Comments)    CBG thrown out of control    Antimicrobials this admission:  2/8 Vancomycin  >> 2/8 Cefepime  >>  2/8 Flagyl >>   Microbiology results:  2/8 MRSA PCR neg 2/8 Covid/Flu neg 2/8 BCx2: ngtd 2/8 UCx: neg FINAL  Thank you for allowing pharmacy to be a part of this patient's care.  Lynelle Doctor 10/15/2019 9:20 AM

## 2019-10-15 NOTE — Progress Notes (Signed)
PROGRESS NOTE    Matthew Zimmerman  [id number] DOB: 1946-04-03 DOA: 10/13/2019 PCP: Jani Gravel, MD    Brief Narrative:73 y.o.malewith medical history significant ofinsulin-dependent diabetes mellitus, hypertension, hyperlipidemia, PE status post IVC filter not on anticoagulation due to history of GI bleed, GERD, recent hospital admissionsfor acute blood loss anemia secondary to GI bleed from colonic AVMs status post blood transfusions presenting to the ED for evaluation of generalized weakness and shortness of breath.Patient states he has not been feeling well for the past 3 days. He is sneezing. Also having shortness of breath. He is having some generalized abdominal discomfort. Reports having nonbloody emesis for the past few days. His stool is dark in color. No diarrhea.Denies fevers, chills, cough, or chest pain. No additional history could be obtained from the patient.  ED Course:Temperature as low as 96 Frectal. Tachycardic and tachypneic on arrival. Blood pressure low on arrival. Not hypoxic. Labs showing WBC count 18.8. Lactic acid 9.3. Hemoglobin 9.8, stable since recent hospital discharge. Bicarb 13, anion gap 23. Blood glucose 314. BUN 52, creatinine 3.5. Baseline creatinine 0.8. Blood ethanol level <10.Acetaminophen level <10.Salicylate level <7.Ammonia level normal. TSH 6.373.Blood culture x2 pending. UA and urine culture pending. SARS-CoV-2 PCR test negative. Influenza panel negative. Chest x-ray showing no acute cardiopulmonary abnormality. Head CT negative for acute intracranial abnormality. CT abdomen pelvis showing increased attenuation and distention of venous structures inferior to the IVC filter, likely representing acute thrombus. No other acute intra-abdominal abnormality.  Patient received vancomycin, cefepime, metronidazole, and 3 L normal saline boluses.   Assessment & Plan:   Principal Problem:   Severe sepsis  ([place]) Active Problems:   AKI (acute kidney injury) ([place])   Metabolic acidosis   Acute metabolic encephalopathy   Emesis   Generalized weakness   Acute renal failure (HCC)   SOB (shortness of breath)   Iron deficiency anemia   Insulin dependent type 2 diabetes mellitus ([place])   History of pulmonary embolus (PE)   Acute deep vein thrombosis (DVT) of both lower extremities (HCC)   #1 severe sepsis with hypotension present on admission unclear etiology-at the time of admission he was tachycardic tachypneic with hypothermia.  He was also found to have elevated lactic acid of 9.3 with leukocytosis with negative chest x-ray.  CT of the abdomen and pelvis did not show anything acute.  Patient was started on empiric IV vancomycin cefepime and Flagyl.  UA negative no urinary complaints.  #2 chronic anemia history of recent GI bleed due to colonic AVMs-appreciate GI input will restart aspirin today.  His hemoglobin today is 7.0 likely due to hemodilution no evidence of active bleeding noted.  Patient received 2 units of blood transfusion today.  #3 AKI improving with IV hydration likely secondary to sepsis and hypotension.  #4 acute metabolic encephalopathy resolved likely secondary to sepsis.  # 5 type 2 diabetes continue SSI A1c 7.09 September 2019.   #6 history of PE and right lower extremity DVT his anticoagulation was stopped during last hospital stay due to recurrent GI bleed.  He had IVC filter placed 10/03/2019. Vascular ultrasound of the IVC there is evidence of acute thrombus involving the IVC and right common iliac vein and left common iliac vein right external iliac vein and left external iliac vein. Vascular Doppler of the lower extremities with acute bilateral lower extremity DVT will start Eliquis.  Discussed with wife.   Estimated body mass index is 28.83 kg/m as calculated from the following:   Height as of this  encounter: 5\' 8"  (1.727 m).   Weight as of this encounter: 86  kg.  DVT prophylaxis: ELIQUIS Code Status:FULL Family Communication:DW WIFE Disposition Plan: CAME FROM HOME  LIKELY WILL NEED SNF  Consultants:   GI  Procedures: NONE Antimicrobials:VANC CEFE FLAGYL  Subjective:  RESTING in bed in nad no bleeding noted gi notes Objective: Vitals:   10/15/19 1400 10/15/19 1500 10/15/19 1536 10/15/19 1600  BP: 140/65 (!) 153/55  (!) 126/50  Pulse: 82 88 91 84  Resp: 16 (!) 26 (!) 23 18  Temp:      TempSrc:      SpO2: 97% 100% 100% 100%  Weight:      Height:        Intake/Output Summary (Last 24 hours) at 10/15/2019 1641 Last data filed at 10/15/2019 1618 Gross per 24 hour  Intake 3404.06 ml  Output 2400 ml  Net 1004.06 ml   Filed Weights   10/13/19 1612  Weight: 86 kg    Examination:  General exam: Appears calm and comfortable  Respiratory system: Clear to auscultation. Respiratory effort normal. Cardiovascular system: S1 & S2 heard, RRR. No JVD, murmurs, rubs, gallops or clicks. No pedal edema. Gastrointestinal system: Abdomen is nondistended, soft and nontender. No organomegaly or masses felt. Normal bowel sounds heard. Central nervous system: Alert and oriented. No focal neurological deficits. Extremities: Trace bilateral edema able to lift the left lower extremity better than the right. Skin: No rashes, lesions or ulcers Psychiatry: Judgement and insight appear normal. Mood & affect appropriate.     Data Reviewed: I have personally reviewed following labs and imaging studies  CBC: Recent Labs  Lab 10/13/19 1636 10/14/19 0245 10/14/19 1523 10/15/19 0217  WBC 18.8* 12.4* 10.4 10.0  NEUTROABS 12.8*  --  7.4 6.5  HGB 9.8* 7.6* 7.2* 7.0*  HCT 32.6* 25.0* 23.5* 23.5*  MCV 89.6 91.6 89.0 90.0  PLT 233 173 188 0000000   Basic Metabolic Panel: Recent Labs  Lab 10/13/19 1636 10/13/19 2310 10/14/19 0245 10/15/19 0217  NA 136 138 140 138  K 4.1 5.1 4.1 4.1  CL 100 108 111 112*  CO2 13* 15* 16* 17*  GLUCOSE 314*  145* 101* 149*  BUN 52* 49* 49* 40*  CREATININE 3.56* 2.83* 2.64* 1.71*  CALCIUM 9.3 8.3* 7.9* 8.3*   GFR: Estimated Creatinine Clearance: 40.4 mL/min (A) (by C-G formula based on SCr of 1.71 mg/dL (H)). Liver Function Tests: Recent Labs  Lab 10/13/19 1636  AST 23  ALT 15  ALKPHOS 78  BILITOT 1.3*  PROT 8.4*  ALBUMIN 4.0   No results for input(s): LIPASE, AMYLASE in the last 168 hours. Recent Labs  Lab 10/13/19 1650  AMMONIA 23   Coagulation Profile: Recent Labs  Lab 10/13/19 1636  INR 1.3*   Cardiac Enzymes: Recent Labs  Lab 10/14/19 0007  CKTOTAL 294   BNP (last 3 results) No results for input(s): PROBNP in the last 8760 hours. HbA1C: No results for input(s): HGBA1C in the last 72 hours. CBG: Recent Labs  Lab 10/14/19 1333 10/14/19 1541 10/14/19 2122 10/15/19 0725 10/15/19 1121  GLUCAP 126* 132* 154* 166* 156*   Lipid Profile: No results for input(s): CHOL, HDL, LDLCALC, TRIG, CHOLHDL, LDLDIRECT in the last 72 hours. Thyroid Function Tests: Recent Labs    10/13/19 1636 10/14/19 0245  TSH 6.373*  --   FREET4  --  0.98   Anemia Panel: Recent Labs    10/14/19 0822  VITAMINB12 346  FOLATE 10.6  FERRITIN  22*  TIBC 362  IRON 37*   Sepsis Labs: Recent Labs  Lab 10/13/19 1932 10/13/19 2310 10/14/19 0245 10/15/19 0217  PROCALCITON  --   --   --  0.50  LATICACIDVEN 6.1* 4.4* 3.1* 0.8    Recent Results (from the past 240 hour(s))  Urine culture     Status: None   Collection Time: 10/13/19  4:36 PM   Specimen: Urine, Clean Catch  Result Value Ref Range Status   Specimen Description   Final    URINE, CLEAN CATCH Performed at [facility], [place] [address], [place], [place] [address]    Special Requests   Final    NONE Performed at [facility], [place] [address], [place], [place] [address]    Culture   Final    NO GROWTH Performed at [facility], [place] [address] [place], [place] [address]     Report Status 10/15/2019 FINAL  Final  Culture, blood (routine x 2)     Status: None (Preliminary result)   Collection Time: 10/13/19  4:37 PM   Specimen: BLOOD  Result Value Ref Range Status   Specimen Description   Final    BLOOD RIGHT ANTECUBITAL Performed at [place] [address], [place], [place] [address]    Special Requests   Final    BOTTLES DRAWN AEROBIC ONLY Blood Culture results may not be optimal due to an inadequate volume of blood received in culture bottles Performed at [place] [address] [place], [place] [address]    Culture   Final    NO GROWTH 2 DAYS Performed at [place] [address], [place], [place] [address]    Report Status PENDING  Incomplete  Culture, blood (routine x 2)     Status: None (Preliminary result)   Collection Time: 10/13/19  4:42 PM   Specimen: BLOOD  Result Value Ref Range Status   Specimen Description   Final    BLOOD LEFT ANTECUBITAL Performed at [place] [address], [place], [place] [address]    Special Requests   Final    BOTTLES DRAWN AEROBIC AND ANAEROBIC Blood Culture results may not be optimal due to an inadequate volume of blood received in culture bottles Performed at [facility] [address], [place], [place] [address]    Culture   Final    NO GROWTH 2 DAYS Performed at [facility] [address], [place], [place] [address]    Report Status PENDING  Incomplete  Respiratory Panel by RT PCR (Flu A&B, Covid) - Nasopharyngeal Swab     Status: None   Collection Time: 10/13/19  4:50 PM   Specimen: Nasopharyngeal Swab  Result Value Ref Range Status   SARS Coronavirus 2 by RT PCR NEGATIVE NEGATIVE Final    Comment: (NOTE) SARS-CoV-2 target nucleic acids are NOT DETECTED. The SARS-CoV-2 RNA is generally detectable in upper respiratoy specimens during the acute phase of infection. The lowest concentration of  SARS-CoV-2 viral copies this assay can detect is 131 copies/mL. A negative result does not preclude SARS-Cov-2 infection and should not be used as the sole basis for treatment or other patient management decisions. A negative result may occur with  improper specimen collection/handling, submission of specimen other than nasopharyngeal swab, presence of viral mutation(s) within the areas targeted by this assay, and inadequate number of viral copies (<131 copies/mL). A negative result must  be combined with clinical observations, patient history, and epidemiological information. The expected result is Negative. Fact Sheet for Patients:  PinkCheek.be Fact Sheet for Healthcare Providers:  GravelBags.it This test is not yet ap proved or cleared by the [place] FDA and  has been authorized for detection and/or diagnosis of SARS-CoV-2 by FDA under an Emergency Use Authorization (EUA). This EUA will remain  in effect (meaning this test can be used) for the duration of the COVID-19 declaration under Section 564(b)(1) of the Act, 21 U.S.C. section 360bbb-3(b)(1), unless the authorization is terminated or revoked sooner.    Influenza A by PCR NEGATIVE NEGATIVE Final   Influenza B by PCR NEGATIVE NEGATIVE Final    Comment: (NOTE) The Xpert Xpress SARS-CoV-2/FLU/RSV assay is intended as an aid in  the diagnosis of influenza from Nasopharyngeal swab specimens and  should not be used as a sole basis for treatment. Nasal washings and  aspirates are unacceptable for Xpert Xpress SARS-CoV-2/FLU/RSV  testing. Fact Sheet for Patients: PinkCheek.be Fact Sheet for Healthcare Providers: GravelBags.it This test is not yet approved or cleared by the [place] FDA and  has been authorized for detection and/or diagnosis of SARS-CoV-2 by  FDA under an Emergency Use Authorization (EUA).  This EUA will remain  in effect (meaning this test can be used) for the duration of the  Covid-19 declaration under Section 564(b)(1) of the Act, 21  U.S.C. section 360bbb-3(b)(1), unless the authorization is  terminated or revoked. Performed at [facility], [place] [address], [place], [place] [address]   MRSA PCR Screening     Status: None   Collection Time: 10/13/19 11:03 PM   Specimen: Nasal Mucosa; Nasopharyngeal  Result Value Ref Range Status   MRSA by PCR NEGATIVE NEGATIVE Final    Comment:        The GeneXpert MRSA Assay (FDA approved for NASAL specimens only), is one component of a comprehensive MRSA colonization surveillance program. It is not intended to diagnose MRSA infection nor to guide or monitor treatment for MRSA infections. Performed at [facility], [place] [address], [place], [place] [address]          Radiology Studies: CT ABDOMEN PELVIS WO CONTRAST  Result Date: 10/13/2019 CLINICAL DATA:  Abdominal pain, fever, rectal bleeding EXAM: CT ABDOMEN AND PELVIS WITHOUT CONTRAST TECHNIQUE: Multidetector CT imaging of the abdomen and pelvis was performed following the standard protocol without IV contrast. COMPARISON:  01/16/2018 FINDINGS: Lower chest: No acute pleural or parenchymal lung disease. Hepatobiliary: No focal liver abnormality is seen. No gallstones, gallbladder wall thickening, or biliary dilatation. Pancreas: Unremarkable. No pancreatic ductal dilatation or surrounding inflammatory changes. Spleen: Normal in size without focal abnormality. Adrenals/Urinary Tract: No evidence of urinary tract calculi or obstructive uropathy within either kidney. Stable right renal cyst. Bladder is minimally distended which limits evaluation. The adrenals are unremarkable. Stomach/Bowel: No bowel obstruction or ileus. Normal retrocecal appendix. Vascular/Lymphatic: IVC filter is identified. Within the iliac veins and IVC below the level of the  filter, there is increased attenuation, venous distension, and mild surrounding fat stranding which may reflect acute thrombus. This would not be unexpected in a patient with a history of DVT who has recently discontinued anticoagulation due to complications. No pathologic adenopathy within the abdomen or pelvis. Reproductive: Mildly enlarged prostate is otherwise unremarkable. Other: No abdominal wall hernia or abnormality. No abdominopelvic ascites. Musculoskeletal: There are no acute or destructive bony lesions. Reconstructed images demonstrate no additional findings. IMPRESSION: 1. IVC filter  as above, with increased attenuation and distension of the venous structures inferior to the filter likely representing acute thrombus. This is not unexpected in a patient with a history of DVT who has recently discontinued anticoagulation due to bleeding complications. 2. No evidence of urinary tract calculi or obstructive uropathy. 3. Otherwise unremarkable unenhanced exam. Electronically Signed   By: Randa Ngo M.D.   On: 10/13/2019 20:13   CT Head Wo Contrast  Result Date: 10/13/2019 CLINICAL DATA:  Encephalopathy. Increased weakness and low oxygen levels. Multiple colonoscopies for rectal bleeding. EXAM: CT HEAD WITHOUT CONTRAST TECHNIQUE: Contiguous axial images were obtained from the base of the skull through the vertex without intravenous contrast. COMPARISON:  09/30/2019 FINDINGS: Brain: No evidence of acute infarction, hemorrhage, hydrocephalus, extra-axial collection or mass lesion/mass effect. Stable appearance of tentorial and parafalcine calcifications. Vascular: No hyperdense vessel or unexpected calcification. Skull: Normal. Negative for fracture or focal lesion. Sinuses/Orbits: No acute finding. Other: None IMPRESSION: No evidence for acute intracranial abnormality. Electronically Signed   By: Nolon Nations M.D.   On: 10/13/2019 20:14   US RENAL  Result Date: 10/13/2019 CLINICAL DATA:  Acute  kidney injury EXAM: RENAL / URINARY TRACT ULTRASOUND COMPLETE COMPARISON:  CT same day FINDINGS: Right Kidney: Renal measurements: 10.3 x 5.3 x 5.5 cm = volume: 155 mL . Echogenicity within normal limits. There is an anechoic cyst seen within the right kidney measuring 4.3 x 4.1 x 4.3 cm. Left Kidney: Renal measurements: 10.3 x 5 x 5 x 4.6 cm = volume: 131 mL. Echogenicity within normal limits. No mass or hydronephrosis visualized. Bladder: Appears normal for degree of bladder distention. Other: None. IMPRESSION: Right anechoic cyst measuring 4.3 x 4.1 x 4.3 cm. Otherwise normal renal ultrasound. Electronically Signed   By: Prudencio Pair M.D.   On: 10/13/2019 22:52   VAS [name] IVC/ILIAC (VENOUS ONLY)  Result Date: 10/15/2019 IVC/ILIAC STUDY Other Factors: Bilateral lower extremity DVT. IVC filter, history of PE.  Comparison Study: No prior study. Performing Technologist: Maudry Mayhew MHA, RDMS, RVT, RDCS  Examination Guidelines: A complete evaluation includes B-mode imaging, spectral Doppler, color Doppler, and power Doppler as needed of all accessible portions of each vessel. Bilateral testing is considered an integral part of a complete examination. Limited examinations for reoccurring indications may be performed as noted.  Abdominal Aorta Findings: +--------+-------+----------+----------+--------+--------+--------+  Location AP (cm) Trans (cm) PSV (cm/s) Waveform Thrombus Comments  +--------+-------+----------+----------+--------+--------+--------+  Distal   3.76               90                                     +--------+-------+----------+----------+--------+--------+--------+ IVC/Iliac Findings: +----------+------+--------+--------------+     IVC     Patent Thrombus    Comments     +----------+------+--------+--------------+  IVC Prox                   Not visualized  +----------+------+--------+--------------+  IVC Mid            acute                   +----------+------+--------+--------------+   IVC Distal         acute                   +----------+------+--------+--------------+  +-------------------+---------+-----------+---------+-----------+--------+          CIV  RT-Patent RT-Thrombus LT-Patent LT-Thrombus Comments  +-------------------+---------+-----------+---------+-----------+--------+  Common Iliac Distal    No        acute       No        acute              +-------------------+---------+-----------+---------+-----------+--------+  +-------------------------+---------+-----------+---------+-----------+--------+             EIV            RT-Patent RT-Thrombus LT-Patent LT-Thrombus Comments  +-------------------------+---------+-----------+---------+-----------+--------+  External Iliac Vein          No        acute       No        acute               Distal                                                                          +-------------------------+---------+-----------+---------+-----------+--------+   Summary: Abdominal Aorta: There is evidence of abnormal dilatation of the distal Abdominal aorta.  IVC/Iliac: There is evidence of acute thrombus involving the IVC. There is evidence of acute thrombus involving the right common iliac vein, left common iliac vein, right external iliac vein, left external iliac vein. Visualization of proximal Inferior Vena Cava was limited.  *See table(s) above for measurements and observations.  Electronically signed by Deitra Mayo MD on 10/15/2019 at 6:50:16 AM.    Final    DG CHEST PORT 1 VIEW  Result Date: 10/14/2019 CLINICAL DATA:  Short of breath EXAM: PORTABLE CHEST 1 VIEW COMPARISON:  10/13/2019 FINDINGS: The heart size and mediastinal contours are within normal limits. Both lungs are clear. The visualized skeletal structures are unremarkable. IMPRESSION: No active disease. Electronically Signed   By: Kerby Moors M.D.   On: 10/14/2019 10:25   DG Chest Portable 1 View  Result Date: 10/13/2019 CLINICAL DATA:  Shortness of  breath, increased weakness and shortness of breath. EXAM: PORTABLE CHEST 1 VIEW COMPARISON:  Chest radiograph 09/30/2019 FINDINGS: Heart size within normal limits. Aortic atherosclerosis. There is no evidence of airspace consolidation. No pleural effusion or pneumothorax. No acute bony abnormality. IMPRESSION: No evidence of acute cardiopulmonary abnormality. Aortic atherosclerosis. Electronically Signed   By: Kellie Simmering DO   On: 10/13/2019 17:02   VAS [name] LOWER EXTREMITY VENOUS (DVT)  Result Date: 10/15/2019  Lower Venous DVTStudy Indications: Swelling, and IVC filter. History of PE.  Comparison Study: No prior study Performing Technologist: Maudry Mayhew MHA, RDMS, RVT, RDCS  Examination Guidelines: A complete evaluation includes B-mode imaging, spectral Doppler, color Doppler, and power Doppler as needed of all accessible portions of each vessel. Bilateral testing is considered an integral part of a complete examination. Limited examinations for reoccurring indications may be performed as noted. The reflux portion of the exam is performed with the patient in reverse Trendelenburg.  +---------+---------------+---------+-----------+----------+--------------+  RIGHT     Compressibility Phasicity Spontaneity Properties Thrombus Aging  +---------+---------------+---------+-----------+----------+--------------+  CFV       None                      No  Acute           +---------+---------------+---------+-----------+----------+--------------+  SFJ       None                                             Acute           +---------+---------------+---------+-----------+----------+--------------+  FV Prox   None                                             Acute           +---------+---------------+---------+-----------+----------+--------------+  FV Mid    None                                             Acute           +---------+---------------+---------+-----------+----------+--------------+   FV Distal None                                             Acute           +---------+---------------+---------+-----------+----------+--------------+  PFV       None                                             Acute           +---------+---------------+---------+-----------+----------+--------------+  POP       None                      No                     Acute           +---------+---------------+---------+-----------+----------+--------------+  PTV       None                      No                     Acute           +---------+---------------+---------+-----------+----------+--------------+  PERO      None                      No                     Acute           +---------+---------------+---------+-----------+----------+--------------+  Gastroc   None                      No                     Acute           +---------+---------------+---------+-----------+----------+--------------+   +---------+---------------+---------+-----------+----------+--------------+  LEFT      Compressibility Phasicity Spontaneity Properties Thrombus Aging  +---------+---------------+---------+-----------+----------+--------------+  CFV  None                      No                     Acute           +---------+---------------+---------+-----------+----------+--------------+  SFJ       None                                             Acute           +---------+---------------+---------+-----------+----------+--------------+  FV Prox   None                                             Acute           +---------+---------------+---------+-----------+----------+--------------+  FV Mid    None                                             Acute           +---------+---------------+---------+-----------+----------+--------------+  FV Distal None                                             Acute           +---------+---------------+---------+-----------+----------+--------------+  PFV       None                                              Acute           +---------+---------------+---------+-----------+----------+--------------+  POP       None                      No                     Acute           +---------+---------------+---------+-----------+----------+--------------+  Gastroc   None                      No                     Acute           +---------+---------------+---------+-----------+----------+--------------+   Left Technical Findings: Not visualized segments include Left posterior tibial and peroneal veins.   Summary: RIGHT: - Findings consistent with acute deep vein thrombosis involving the right common femoral vein, SF junction, right femoral vein, right proximal profunda vein, right popliteal vein, right posterior tibial veins, right peroneal veins, and right gastrocnemius veins. - No cystic structure found in the popliteal fossa.  LEFT: - Findings consistent with acute deep vein thrombosis involving the left common femoral vein, SF junction, left femoral vein, left proximal profunda vein, left popliteal vein, and left gastrocnemius veins. - No cystic structure found in the popliteal fossa.  *  See table(s) above for measurements and observations. Electronically signed by Deitra Mayo MD on 10/15/2019 at 6:50:34 AM.    Final         Scheduled Meds:  atorvastatin  10 mg Oral QPM   Chlorhexidine Gluconate Cloth  6 each Topical Daily   insulin aspart  0-15 Units Subcutaneous TID WC   insulin aspart  0-5 Units Subcutaneous QHS   mouth rinse  15 mL Mouth Rinse BID   pantoprazole (PROTONIX) IV  40 mg Intravenous Q12H   vitamin B-12  100 mcg Oral Daily   Continuous Infusions:  sodium chloride 125 mL/hr at 10/15/19 1618   ceFEPime (MAXIPIME) IV Stopped (10/15/19 1044)   metronidazole Stopped (10/15/19 1417)   vancomycin Stopped (10/15/19 1152)     LOS: 2 days     Georgette Shell, MD  10/15/2019, 4:41 PM

## 2019-10-15 NOTE — Discharge Instructions (Signed)
Information on my medicine - ELIQUIS (apixaban)  This medication education was reviewed with me or my healthcare representative as part of my discharge preparation.  Why was Eliquis prescribed for you? Eliquis was prescribed to treat blood clots that may have been found in the veins of your legs (deep vein thrombosis) or in your lungs (pulmonary embolism) and to reduce the risk of them occurring again.  What do You need to know about Eliquis ? The starting dose is 10 mg (two 5 mg tablets) taken TWICE daily for the FIRST SEVEN (7) DAYS, then on Thursday February 18th  the dose is reduced to ONE 5 mg tablet taken TWICE daily.  Eliquis may be taken with or without food.   Try to take the dose about the same time in the morning and in the evening. If you have difficulty swallowing the tablet whole please discuss with your pharmacist how to take the medication safely.  Take Eliquis exactly as prescribed and DO NOT stop taking Eliquis without talking to the doctor who prescribed the medication.  Stopping may increase your risk of developing a new blood clot.  Refill your prescription before you run out.  After discharge, you should have regular check-up appointments with your healthcare provider that is prescribing your Eliquis.    What do you do if you miss a dose? If a dose of ELIQUIS is not taken at the scheduled time, take it as soon as possible on the same day and twice-daily administration should be resumed. The dose should not be doubled to make up for a missed dose.  Important Safety Information A possible side effect of Eliquis is bleeding. You should call your healthcare provider right away if you experience any of the following: ? Bleeding from an injury or your nose that does not stop. ? Unusual colored urine (red or dark brown) or unusual colored stools (red or black). ? Unusual bruising for unknown reasons. ? A serious fall or if you hit your head (even if there is no  bleeding).  Some medicines may interact with Eliquis and might increase your risk of bleeding or clotting while on Eliquis. To help avoid this, consult your healthcare provider or pharmacist prior to using any new prescription or non-prescription medications, including herbals, vitamins, non-steroidal anti-inflammatory drugs (NSAIDs) and supplements.  This website has more information on Eliquis (apixaban): http://www.eliquis.com/eliquis/home

## 2019-10-15 NOTE — Progress Notes (Addendum)
Hemoccult came back negative.  Hemoglobin has leveled off at 7.0, essentially unchanged from 12 hours earlier.   On exam, pt is in no distress, skin warm, radial pulse full, VS nl.  Receiving blood transfusion at this time.  IMPRESSION: It appears that the drop in patient's hemoglobin since admission reflects equilibration; the patient was probably somewhat volume contracted at time of admission.  Based on previous hemoglobin levels, it does appear that the patient has lost blood between his recent discharge and this admission, roughly a week later.  However, it does not appear that he is having GI bleeding at this time, and I do not think there is evidence for hemolysis or blood loss elsewhere in the body (for example, hematoma).  PLAN:  We will sign off for now.  However, please call us if the patient shows evidence of recurrent GI bleeding, in which case we might even consider doing an egd, which he has not had.  Would maintain the patient on permanent PPI prophylaxis (for example, pantoprazole 40 mg once daily) if the patient remains on aspirin.  (No objection to resumption of aspirin at this time if it is felt to be clinically important.)  It is conceivable pt could go back on anticoagulation (with currently heme negative stool) if that was felt to be clinically important.  Please call me if you have any questions.  Cleotis Nipper, M.D. Pager [phone] If no answer or after 5 PM call [phone]

## 2019-10-15 NOTE — Progress Notes (Signed)
ANTICOAGULATION CONSULT NOTE - Initial Consult  Pharmacy Consult for apixaban Indication: DVT  Allergies  Allergen Reactions  . Cortisone Other (See Comments)    CBG thrown out of control    Patient Measurements: Height: 5\' 8"  (172.7 cm) Weight: 189 lb 9.5 oz (86 kg) IBW/kg (Calculated) : 68.4 Heparin Dosing Weight:   Vital Signs: Temp: 98.3 F (36.8 C) (02/10 1200) Temp Source: Oral (02/10 1053) BP: 126/50 (02/10 1600) Pulse Rate: 84 (02/10 1600)  Labs: Recent Labs    10/13/19 1636 10/13/19 1636 10/13/19 2310 10/14/19 0007 10/14/19 0245 10/14/19 0245 10/14/19 1523 10/15/19 0217  HGB 9.8*   < >  --   --  7.6*   < > 7.2* 7.0*  HCT 32.6*   < >  --   --  25.0*  --  23.5* 23.5*  PLT 233   < >  --   --  173  --  188 204  LABPROT 16.4*  --   --   --   --   --   --   --   INR 1.3*  --   --   --   --   --   --   --   CREATININE 3.56*   < > 2.83*  --  2.64*  --   --  1.71*  CKTOTAL  --   --   --  294  --   --   --   --    < > = values in this interval not displayed.    Estimated Creatinine Clearance: 40.4 mL/min (A) (by C-G formula based on SCr of 1.71 mg/dL (H)).   Medical History: Past Medical History:  Diagnosis Date  . Carpal tunnel syndrome on both sides   . Chest pain   . Diabetes mellitus without complication ([place])   . GERD (gastroesophageal reflux disease)   . Headache   . Hypercholesteremia   . Hyperlipidemia   . PE (pulmonary embolism)   . Shortness of breath dyspnea    PE     Medications:  Medications Prior to Admission  Medication Sig Dispense Refill Last Dose  . aspirin EC 81 MG tablet Take 81 mg by mouth every evening.    10/12/2019 at Unknown time  . atorvastatin (LIPITOR) 20 MG tablet Take 10 mg by mouth every evening.    10/12/2019 at Unknown time  . augmented betamethasone dipropionate (DIPROLENE-AF) 0.05 % cream Apply 1 application topically daily as needed (itchy skin near bottom).    unknown  . clotrimazole-betamethasone (LOTRISONE) cream  Apply 1 application topically 2 (two) times daily as needed (itching).    Past Week at Unknown time  . dapagliflozin propanediol (FARXIGA) 5 MG TABS tablet Take 5 mg by mouth daily.   10/12/2019 at Unknown time  . diclofenac sodium (VOLTAREN) 1 % GEL Apply 1 application topically 4 (four) times daily as needed (pain).   10/12/2019 at Unknown time  . insulin NPH-regular Human (NOVOLIN 70/30) (70-30) 100 UNIT/ML injection Inject 25-35 Units into the skin 2 (two) times daily with a meal. Humulin   10/13/2019 at Unknown time  . metFORMIN (GLUCOPHAGE) 500 MG tablet Take 1,000 mg by mouth 2 (two) times daily with a meal.    10/13/2019 at Unknown time  . metoprolol succinate (TOPROL-XL) 25 MG 24 hr tablet Take 25 mg by mouth daily.   10/13/2019 at 0900  . vitamin B-12 (CYANOCOBALAMIN) 100 MCG tablet Take 100 mcg by mouth daily.   10/13/2019 at  Unknown time  . Vitamin D, Ergocalciferol, (DRISDOL) 1.25 MG (50000 UNIT) CAPS capsule Take 50,000 Units by mouth every 7 (seven) days.   10/13/2019 at Unknown time  . BD INSULIN SYRINGE U/F 31G X 5/16" 0.5 ML MISC 2 (two) times daily.     [name] glucose blood (ONE TOUCH ULTRA TEST) test strip Use to test blood sugar twice a day     . Lancets (ONETOUCH DELICA PLUS [id number]) MISC Use to test blood sugar twice daily     . metoprolol succinate (TOPROL-XL) 25 MG 24 hr tablet Take 1 tablet (25 mg total) by mouth daily. Take with or immediately following a meal. Take at night. (Patient taking differently: Take 25 mg by mouth daily. Take with or immediately following a meal.) 90 tablet 1     Assessment: 74 yo M to start apixaban for acute B LE DVTs and acute thrombus involving IVC & R common ileac vein and L common ileac vein.  PTA hx PE/DVT with IVC filter placed 10/03/19. PTA hx GIB 2nd colonic AVMs.  Hg 7> 2 U PRBCs. FOB negative. Cleared by GI to start anticoagulation.    Plan:  apixaban 10 mg po bid x 7 days followed by apixaban 5 mg po bid Will educate and give 30 day free card prior  to discharge.  Eudelia Bunch, Pharm.D [phone] 10/15/2019 5:05 PM

## 2019-10-16 ENCOUNTER — Ambulatory Visit: Payer: Self-pay | Admitting: Pharmacist

## 2019-10-16 LAB — BPAM RBC
Blood Product Expiration Date: 202103082359
Blood Product Expiration Date: 202103112359
ISSUE DATE / TIME: 202102100512
ISSUE DATE / TIME: 202102100839
Unit Type and Rh: 5100
Unit Type and Rh: 5100

## 2019-10-16 LAB — CBC WITH DIFFERENTIAL/PLATELET
Abs Immature Granulocytes: 0.18 10*3/uL — ABNORMAL HIGH (ref 0.00–0.07)
Abs Immature Granulocytes: 0.16 10*3/uL — ABNORMAL HIGH (ref 0.00–0.07)
Basophils Absolute: 0.1 10*3/uL (ref 0.0–0.1)
Basophils Absolute: 0.1 10*3/uL (ref 0.0–0.1)
Basophils Relative: 1 %
Basophils Relative: 1 %
Eosinophils Absolute: 0.1 10*3/uL (ref 0.0–0.5)
Eosinophils Absolute: 0.2 10*3/uL (ref 0.0–0.5)
Eosinophils Relative: 1 %
Eosinophils Relative: 2 %
HCT: 30.3 % — ABNORMAL LOW (ref 39.0–52.0)
HCT: 33.1 % — ABNORMAL LOW (ref 39.0–52.0)
Hemoglobin: 9.6 g/dL — ABNORMAL LOW (ref 13.0–17.0)
Hemoglobin: 10 g/dL — ABNORMAL LOW (ref 13.0–17.0)
Immature Granulocytes: 2 %
Immature Granulocytes: 1 %
Lymphocytes Relative: 14 %
Lymphocytes Relative: 13 %
Lymphs Abs: 1.5 10*3/uL (ref 0.7–4.0)
Lymphs Abs: 1.6 10*3/uL (ref 0.7–4.0)
MCH: 28.6 pg (ref 26.0–34.0)
MCH: 28.5 pg (ref 26.0–34.0)
MCHC: 31.7 g/dL (ref 30.0–36.0)
MCHC: 30.2 g/dL (ref 30.0–36.0)
MCV: 90.2 fL (ref 80.0–100.0)
MCV: 94.3 fL (ref 80.0–100.0)
Monocytes Absolute: 1.1 10*3/uL — ABNORMAL HIGH (ref 0.1–1.0)
Monocytes Absolute: 1.2 10*3/uL — ABNORMAL HIGH (ref 0.1–1.0)
Monocytes Relative: 10 %
Monocytes Relative: 10 %
Neutro Abs: 7.8 10*3/uL — ABNORMAL HIGH (ref 1.7–7.7)
Neutro Abs: 8.6 10*3/uL — ABNORMAL HIGH (ref 1.7–7.7)
Neutrophils Relative %: 72 %
Neutrophils Relative %: 73 %
Platelets: 211 10*3/uL (ref 150–400)
Platelets: 240 10*3/uL (ref 150–400)
RBC: 3.36 MIL/uL — ABNORMAL LOW (ref 4.22–5.81)
RBC: 3.51 MIL/uL — ABNORMAL LOW (ref 4.22–5.81)
RDW: 16.9 % — ABNORMAL HIGH (ref 11.5–15.5)
RDW: 17.3 % — ABNORMAL HIGH (ref 11.5–15.5)
WBC: 10.8 10*3/uL — ABNORMAL HIGH (ref 4.0–10.5)
WBC: 11.8 10*3/uL — ABNORMAL HIGH (ref 4.0–10.5)
nRBC: 0.4 % — ABNORMAL HIGH (ref 0.0–0.2)
nRBC: 0.3 % — ABNORMAL HIGH (ref 0.0–0.2)

## 2019-10-16 LAB — TYPE AND SCREEN
ABO/RH(D): A POS
Antibody Screen: POSITIVE
DAT, IgG: POSITIVE
Donor AG Type: NEGATIVE
Donor AG Type: NEGATIVE
Unit division: 0
Unit division: 0

## 2019-10-16 LAB — PROCALCITONIN: Procalcitonin: 0.3 ng/mL

## 2019-10-16 LAB — COMPREHENSIVE METABOLIC PANEL
ALT: 12 U/L (ref 0–44)
AST: 11 U/L — ABNORMAL LOW (ref 15–41)
Albumin: 3.1 g/dL — ABNORMAL LOW (ref 3.5–5.0)
Alkaline Phosphatase: 56 U/L (ref 38–126)
Anion gap: 9 (ref 5–15)
BUN: 30 mg/dL — ABNORMAL HIGH (ref 8–23)
CO2: 17 mmol/L — ABNORMAL LOW (ref 22–32)
Calcium: 8.3 mg/dL — ABNORMAL LOW (ref 8.9–10.3)
Chloride: 108 mmol/L (ref 98–111)
Creatinine, Ser: 1.27 mg/dL — ABNORMAL HIGH (ref 0.61–1.24)
GFR calc Af Amer: >60 mL/min (ref >60)
GFR calc non Af Amer: 55 mL/min — ABNORMAL LOW (ref >60)
Glucose, Bld: 212 mg/dL — ABNORMAL HIGH (ref 70–99)
Potassium: 4.1 mmol/L (ref 3.5–5.1)
Sodium: 134 mmol/L — ABNORMAL LOW (ref 135–145)
Total Bilirubin: 1.2 mg/dL (ref 0.3–1.2)
Total Protein: 6.6 g/dL (ref 6.5–8.1)

## 2019-10-16 LAB — CBC
HCT: 30.4 % — ABNORMAL LOW (ref 39.0–52.0)
Hemoglobin: 9.7 g/dL — ABNORMAL LOW (ref 13.0–17.0)
MCH: 28.9 pg (ref 26.0–34.0)
MCHC: 31.9 g/dL (ref 30.0–36.0)
MCV: 90.5 fL (ref 80.0–100.0)
Platelets: 205 10*3/uL (ref 150–400)
RBC: 3.36 MIL/uL — ABNORMAL LOW (ref 4.22–5.81)
RDW: 17.1 % — ABNORMAL HIGH (ref 11.5–15.5)
WBC: 10.9 10*3/uL — ABNORMAL HIGH (ref 4.0–10.5)
nRBC: 0.4 % — ABNORMAL HIGH (ref 0.0–0.2)

## 2019-10-16 LAB — GLUCOSE, CAPILLARY
Glucose-Capillary: 250 mg/dL — ABNORMAL HIGH (ref 70–99)
Glucose-Capillary: 268 mg/dL — ABNORMAL HIGH (ref 70–99)
Glucose-Capillary: 219 mg/dL — ABNORMAL HIGH (ref 70–99)
Glucose-Capillary: 225 mg/dL — ABNORMAL HIGH (ref 70–99)

## 2019-10-16 MED ORDER — SENNA 8.6 MG PO TABS
2.0000 | ORAL_TABLET | Freq: Every day | ORAL | Status: DC
  Administered 2019-10-16: 17.2 mg via ORAL
  Filled 2019-10-16: qty 2

## 2019-10-16 MED ORDER — DOCUSATE SODIUM 100 MG PO CAPS
200.0000 mg | ORAL_CAPSULE | Freq: Every day | ORAL | Status: DC
  Administered 2019-10-16 – 2019-10-17 (×2): 200 mg via ORAL
  Filled 2019-10-16 (×2): qty 2

## 2019-10-16 MED ORDER — FUROSEMIDE 10 MG/ML IJ SOLN
40.0000 mg | Freq: Once | INTRAMUSCULAR | Status: AC
  Administered 2019-10-16: 40 mg via INTRAVENOUS
  Filled 2019-10-16: qty 4

## 2019-10-16 MED ORDER — VANCOMYCIN HCL 1500 MG/300ML IV SOLN
1500.0000 mg | INTRAVENOUS | Status: DC
  Administered 2019-10-16: 1500 mg via INTRAVENOUS
  Filled 2019-10-16: qty 300

## 2019-10-16 MED ORDER — INSULIN GLARGINE 100 UNIT/ML ~~LOC~~ SOLN
5.0000 [IU] | Freq: Every day | SUBCUTANEOUS | Status: DC
  Administered 2019-10-16: 5 [IU] via SUBCUTANEOUS
  Filled 2019-10-16 (×2): qty 0.05

## 2019-10-16 NOTE — Progress Notes (Signed)
Pharmacy Antibiotic Note  Matthew Zimmerman is a 74 y.o. male with hx GIB and VTE (s/p IVC filter) presented to the ED on 10/13/2019 with c/o generalized weakness and SOB.  He was started on vancomycin, cefepime and metronidazole on admission for suspected sepsis.  Today, 10/16/2019: - day #4 abx - afeb, wbc wnl - scr trending down with 1.27 today (crcl~54) - PCT trending down  0.30 - all cultures have been negative thus far  Plan: - continue cefepime dose to 2gm IV q12h - adjust vancomycin to 1500 mg IV q24h for est AUC 534 - continue flagyl 500 mg IV q8h - monitor renal function closely  ________________________________   Height: 5\' 8"  (172.7 cm) Weight: 189 lb 9.5 oz (86 kg) IBW/kg (Calculated) : 68.4  Temp (24hrs), Avg:98.3 F (36.8 C), Min:97.5 F (36.4 C), Max:99.1 F (37.3 C)  Recent Labs  Lab 10/13/19 1636 10/13/19 1636 10/13/19 1932 10/13/19 2310 10/14/19 0245 10/14/19 1523 10/15/19 0217 10/15/19 1420 10/16/19 0208  WBC 18.8*   < >  --   --  12.4* 10.4 10.0 10.4 10.8*  10.9*  CREATININE 3.56*  --   --  2.83* 2.64*  --  1.71*  --  1.27*  LATICACIDVEN 9.3*  --  6.1* 4.4* 3.1*  --  0.8  --   --    < > = values in this interval not displayed.    Estimated Creatinine Clearance: 54.4 mL/min (A) (by C-G formula based on SCr of 1.27 mg/dL (H)).    Allergies  Allergen Reactions  . Cortisone Other (See Comments)    CBG thrown out of control    Antimicrobials this admission:  2/8 Vancomycin  >> 2/8 Cefepime  >>  2/8 Flagyl >>   Microbiology results:  2/8 MRSA PCR neg 2/8 Covid/Flu neg 2/8 BCx2: ngtd 2/8 UCx: neg FINAL  Thank you for allowing pharmacy to be a part of this patient's care.  Lynelle Doctor 10/16/2019 8:25 AM

## 2019-10-16 NOTE — Evaluation (Signed)
Physical Therapy Evaluation Patient Details Name: Matthew Zimmerman MRN: [id number] DOB: 28-Aug-1946 Today's Date: 10/16/2019   History of Present Illness  74 y.o. male with medical history significant of insulin-dependent diabetes mellitus, hypertension, hyperlipidemia, PE status post IVC filter not on anticoagulation due to history of GI bleed, GERD, recent hospital admissions for acute blood loss anemia secondary to GI bleed from colonic AVMs status post blood transfusions presenting to the ED for evaluation of generalized weakness and shortness of breath.  Pt admitted for severe sepsis with hypotension present on admission unclear etiology  Clinical Impression  Pt admitted with above diagnosis. Pt currently with functional limitations due to the deficits listed below (see PT Problem List). Pt will benefit from skilled PT to increase their independence and safety with mobility to allow discharge to the venue listed below.  Pt with recent admission about a week ago and able to ambulate short distance at that time.  Pt reports not being able to move well at home.  Pt requiring mod assist for bed mobility and unable to tolerate standing mostly due to pain however suspect weakness as well since pt reports not ambulating prior to admission.  Pt would benefit from SNF upon d/c as his spouse would be unable to care for him at home in current condition.     Follow Up Recommendations SNF    Equipment Recommendations  None recommended by PT    Recommendations for Other Services       Precautions / Restrictions Precautions Precautions: Fall Precaution Comments: Pt tolerating ltd wt on R LE 2* foot/ankle pain (from 10/05/19), pt also reporting pain in R LE 10/16/19      Mobility  Bed Mobility Overal bed mobility: Needs Assistance Bed Mobility: Supine to Sit;Sit to Supine     Supine to sit: Mod assist Sit to supine: Mod assist   General bed mobility comments: required increased time, effortful,  required assist for LEs over EOB, required assist for LEs onto bed  Transfers                 General transfer comment: positioned and RW ready however pt felt unable to stand due to pain especially in R LE (tightness)  Ambulation/Gait                Stairs            Wheelchair Mobility    Modified Rankin (Stroke Patients Only)       Balance Overall balance assessment: Needs assistance Sitting-balance support: Single extremity supported;Feet supported Sitting balance-Leahy Scale: Poor Sitting balance - Comments: required UE support                                     Pertinent Vitals/Pain Pain Assessment: Faces Faces Pain Scale: Hurts even more Pain Location: R thigh/groin Pain Descriptors / Indicators: Grimacing;Guarding Pain Intervention(s): Repositioned;Monitored during session    Home Living Family/patient expects to be discharged to:: Private residence Living Arrangements: Spouse/significant other Available Help at Discharge: Family;Available 24 hours/day Type of Home: House Home Access: Stairs to enter Entrance Stairs-Rails: Right Entrance Stairs-Number of Steps: 4 Home Layout: Multi-level Home Equipment: Other (comment);Walker - 2 wheels(tub seat)      Prior Function Level of Independence: Independent               Hand Dominance        Extremity/Trunk Assessment  Lower Extremity Assessment Lower Extremity Assessment: Generalized weakness;RLE deficits/detail RLE Deficits / Details: R LE appears more edematous, pt reports tightness, tender to touch, required assist for movement       Communication   Communication: HOH  Cognition Arousal/Alertness: Awake/alert Behavior During Therapy: WFL for tasks assessed/performed Overall Cognitive Status: Within Functional Limits for tasks assessed                                        General Comments      Exercises     Assessment/Plan     PT Assessment Patient needs continued PT services  PT Problem List Decreased strength;Decreased mobility;Pain;Decreased knowledge of use of DME;Decreased balance;Decreased activity tolerance       PT Treatment Interventions DME instruction;Gait training;Functional mobility training;Therapeutic activities;Therapeutic exercise;Balance training;Patient/family education    PT Goals (Current goals can be found in the Care Plan section)  Acute Rehab PT Goals PT Goal Formulation: With patient Time For Goal Achievement: 10/30/19 Potential to Achieve Goals: Fair    Frequency Min 2X/week   Barriers to discharge        Co-evaluation               AM-PAC PT "6 Clicks" Mobility  Outcome Measure Help needed turning from your back to your side while in a flat bed without using bedrails?: A Lot Help needed moving from lying on your back to sitting on the side of a flat bed without using bedrails?: A Lot Help needed moving to and from a bed to a chair (including a wheelchair)?: Total Help needed standing up from a chair using your arms (e.g., wheelchair or bedside chair)?: Total Help needed to walk in hospital room?: Total Help needed climbing 3-5 steps with a railing? : Total 6 Click Score: 8    End of Session Equipment Utilized During Treatment: Gait belt Activity Tolerance: Patient limited by pain;Patient limited by fatigue Patient left: in bed;with call bell/phone within reach;with bed alarm set   PT Visit Diagnosis: Difficulty in walking, not elsewhere classified (R26.2)    Time: [id number] PT Time Calculation (min) (ACUTE ONLY): 23 min   Charges:   PT Evaluation $PT Eval Low Complexity: 1 Low        Kati PT, DPT Acute Rehabilitation Services Office: [phone]  Trena Platt 10/16/2019, 12:31 PM

## 2019-10-16 NOTE — Progress Notes (Signed)
PROGRESS NOTE    Matthew Zimmerman  [id number] DOB: [birth year] DOA: 10/13/2019 PCP: Jani Gravel, MD  Brief Narrative: 74 y.o.malewith medical history significant ofinsulin-dependent diabetes mellitus, hypertension, hyperlipidemia, PE status post IVC filter not on anticoagulation due to history of GI bleed, GERD, recent hospital admissionsfor acute blood loss anemia secondary to GI bleed from colonic AVMs status post blood transfusions presenting to the ED for evaluation of generalized weakness and shortness of breath.Patient states he has not been feeling well for the past 3 days. He is sneezing. Also having shortness of breath. He is having some generalized abdominal discomfort. Reports having nonbloody emesis for the past few days. His stool is dark in color. No diarrhea.Denies fevers, chills, cough, or chest pain. No additional history could be obtained from the patient.  ED Course:Temperature as low as 96 Frectal. Tachycardic and tachypneic on arrival. Blood pressure low on arrival. Not hypoxic. Labs showing WBC count 18.8. Lactic acid 9.3. Hemoglobin 9.8, stable since recent hospital discharge. Bicarb 13, anion gap 23. Blood glucose 314. BUN 52, creatinine 3.5. Baseline creatinine 0.8. Blood ethanol level <10.Acetaminophen level <10.Salicylate level <7.Ammonia level normal. TSH 6.373.Blood culture x2 pending. UA and urine culture pending. SARS-CoV-2 PCR test negative. Influenza panel negative. Chest x-ray showing no acute cardiopulmonary abnormality. Head CT negative for acute intracranial abnormality. CT abdomen pelvis showing increased attenuation and distention of venous structures inferior to the IVC filter, likely representing acute thrombus. No other acute intra-abdominal abnormality.  Patient received vancomycin, cefepime, metronidazole, and 3 L normal saline boluses.  Assessment & Plan:   Principal Problem:   Severe sepsis ([facility]) Active  Problems:   AKI (acute kidney injury) ([facility])   Metabolic acidosis   Acute metabolic encephalopathy   Emesis   Generalized weakness   Acute renal failure ([facility])   SOB (shortness of breath)   Iron deficiency anemia   Insulin dependent type 2 diabetes mellitus ([facility])   History of pulmonary embolus (PE)   Acute deep vein thrombosis (DVT) of both lower extremities ([facility])   #1 severe sepsis with hypotension present on admission unclear etiology-at the time of admission he was tachycardic tachypneic with hypothermia.  He was also found to have elevated lactic acid of 9.3 with leukocytosis with negative chest x-ray.   CT of the abdomen and pelvis did not show anything acute.  Patient was started on empiric IV vancomycin cefepime and Flagyl.   UA negative no urinary complaints. Was on Vanco cefepime and Flagyl. MRSA PCR negative DC Vanco. Will DC Flagyl no suspicion for intra-abdominal infection. Continue cefepime for now.  #2 chronic anemia history of recent GI bleed due to colonic AVMs-appreciate GI input.  Patient started on Eliquis 10/15/2019.  His hemoglobin today is 10.0 after 2 units of blood transfusion.  And has remained stable. Patient received 2 units of blood transfusion during this hospital stay.  #3 AKI improving with IV hydration likely secondary to sepsis and hypotension.  #4 acute metabolic encephalopathy resolved likely secondary to sepsis.  Resolved  # 5 type 2 diabetes continue SSI A1c 7.09 September 2019. CBG 156, 166, 211, 250, 268.  Blood sugar trending up start Lantus 5 units nightly.   #6 history of PE and right lower extremity DVT his anticoagulation was stopped during last hospital stay due to recurrent GI bleed.  He had IVC filter placed 10/03/2019. Vascular ultrasound of the IVC there is evidence of acute thrombus involving the IVC and right common iliac vein and left common iliac vein right external  iliac vein and left external iliac vein. Vascular Doppler of the  lower extremities with acute bilateral lower extremity DVT will start Eliquis.  Discussed with wife.    Estimated body mass index is 28.83 kg/m as calculated from the following:   Height as of this encounter: 5\' 8"  (1.727 m).   Weight as of this encounter: 86 kg.  DVT prophylaxis: Eliquis Code Status: Full code Family Communication: Discussed with patient's wife Disposition Plan: Patient came from home plan is to discharge to SNF.  Barriers to discharge is placement I have ordered Covid test.     Consultants:   GI  Procedures: None Antimicrobials: None  Subjective: Patient awake alert Able to move his left leg better than yesterday.  Objective: Vitals:   10/16/19 1000 10/16/19 1100 10/16/19 1200 10/16/19 1300  BP: (!) 146/89 (!) 141/71 119/82 126/75  Pulse: 97 93 99   Resp: (!) 28 (!) 26 (!) 33 (!) 23  Temp:   98.9 F (37.2 C)   TempSrc:   Oral   SpO2: 96% 96% 92%   Weight:      Height:        Intake/Output Summary (Last 24 hours) at 10/16/2019 1347 Last data filed at 10/16/2019 1334 Gross per 24 hour  Intake 1644.4 ml  Output 1975 ml  Net -330.6 ml   Filed Weights   10/13/19 1612  Weight: 86 kg    Examination:  General exam: Appears calm and comfortable  Respiratory system: Clear to auscultation. Respiratory effort normal. Cardiovascular system: S1 & S2 heard, RRR. No JVD, murmurs, rubs, gallops or clicks. No pedal edema. Gastrointestinal system: Abdomen is nondistended, soft and nontender. No organomegaly or masses felt. Normal bowel sounds heard. Central nervous system: Alert and oriented. No focal neurological deficits. Extremities: 2 plus edema Skin: No rashes, lesions or ulcers Psychiatry: Judgement and insight appear normal. Mood & affect appropriate.     Data Reviewed: I have personally reviewed following labs and imaging studies  CBC: Recent Labs  Lab 10/13/19 1636 10/13/19 1636 10/14/19 0245 10/14/19 1523 10/15/19 0217 10/15/19 1420  10/16/19 0208  WBC 18.8*   < > 12.4* 10.4 10.0 10.4 10.8*  10.9*  NEUTROABS 12.8*  --   --  7.4 6.5 7.3 7.8*  HGB 9.8*   < > 7.6* 7.2* 7.0* 9.4* 9.6*  9.7*  HCT 32.6*   < > 25.0* 23.5* 23.5* 30.9* 30.3*  30.4*  MCV 89.6   < > 91.6 89.0 90.0 92.5 90.2  90.5  PLT 233   < > 173 188 204 206 211  205   < > = values in this interval not displayed.   Basic Metabolic Panel: Recent Labs  Lab 10/13/19 1636 10/13/19 2310 10/14/19 0245 10/15/19 0217 10/16/19 0208  NA 136 138 140 138 134*  K 4.1 5.1 4.1 4.1 4.1  CL 100 108 111 112* 108  CO2 13* 15* 16* 17* 17*  GLUCOSE 314* 145* 101* 149* 212*  BUN 52* 49* 49* 40* 30*  CREATININE 3.56* 2.83* 2.64* 1.71* 1.27*  CALCIUM 9.3 8.3* 7.9* 8.3* 8.3*   GFR: Estimated Creatinine Clearance: 54.4 mL/min (A) (by C-G formula based on SCr of 1.27 mg/dL (H)). Liver Function Tests: Recent Labs  Lab 10/13/19 1636 10/16/19 0208  AST 23 11*  ALT 15 12  ALKPHOS 78 56  BILITOT 1.3* 1.2  PROT 8.4* 6.6  ALBUMIN 4.0 3.1*   No results for input(s): LIPASE, AMYLASE in the last 168 hours. Recent Labs  Lab 10/13/19 1650  AMMONIA 23   Coagulation Profile: Recent Labs  Lab 10/13/19 1636  INR 1.3*   Cardiac Enzymes: Recent Labs  Lab 10/14/19 0007  CKTOTAL 294   BNP (last 3 results) No results for input(s): PROBNP in the last 8760 hours. HbA1C: No results for input(s): HGBA1C in the last 72 hours. CBG: Recent Labs  Lab 10/15/19 1121 10/15/19 1724 10/15/19 2150 10/16/19 0739 10/16/19 1145  GLUCAP 156* 166* 211* 250* 268*   Lipid Profile: No results for input(s): CHOL, HDL, LDLCALC, TRIG, CHOLHDL, LDLDIRECT in the last 72 hours. Thyroid Function Tests: Recent Labs    10/13/19 1636 10/14/19 0245  TSH 6.373*  --   FREET4  --  0.98   Anemia Panel: Recent Labs    10/14/19 0822  VITAMINB12 346  FOLATE 10.6  FERRITIN 22*  TIBC 362  IRON 37*   Sepsis Labs: Recent Labs  Lab 10/13/19 1932 10/13/19 2310 10/14/19 0245  10/15/19 0217 10/16/19 0208  PROCALCITON  --   --   --  0.50 0.30  LATICACIDVEN 6.1* 4.4* 3.1* 0.8  --     Recent Results (from the past 240 hour(s))  Urine culture     Status: None   Collection Time: 10/13/19  4:36 PM   Specimen: Urine, Clean Catch  Result Value Ref Range Status   Specimen Description   Final    URINE, CLEAN CATCH Performed at [facility], [place] [address] [place], [place] [address]    Special Requests   Final    NONE Performed at [facility], [place] [address] [place], [place] [address]    Culture   Final    NO GROWTH Performed at [facility], [place] [address], [place], [place] [address]    Report Status 10/15/2019 FINAL  Final  Culture, blood (routine x 2)     Status: None (Preliminary result)   Collection Time: 10/13/19  4:37 PM   Specimen: BLOOD  Result Value Ref Range Status   Specimen Description   Final    BLOOD RIGHT ANTECUBITAL Performed at [place] [address], [place], [place] [address]    Special Requests   Final    BOTTLES DRAWN AEROBIC ONLY Blood Culture results may not be optimal due to an inadequate volume of blood received in culture bottles Performed at [place] [address] [place], [place] [address]    Culture   Final    NO GROWTH 3 DAYS Performed at [facility], [place] [address] [place], [place] [address]    Report Status PENDING  Incomplete  Culture, blood (routine x 2)     Status: None (Preliminary result)   Collection Time: 10/13/19  4:42 PM   Specimen: BLOOD  Result Value Ref Range Status   Specimen Description   Final    BLOOD LEFT ANTECUBITAL Performed at [place] [address], [place], [place] [address]    Special Requests   Final    BOTTLES DRAWN AEROBIC AND ANAEROBIC Blood Culture results may not be optimal due to an inadequate volume of blood received in culture bottles Performed at [place] [address] [place], [place] [address]    Culture   Final    NO GROWTH 3 DAYS Performed at [facility], [place] [address] [place], [place] [address]    Report Status PENDING  Incomplete  Respiratory Panel by RT  PCR (Flu A&B, Covid) - Nasopharyngeal Swab     Status: None   Collection Time: 10/13/19  4:50 PM   Specimen: Nasopharyngeal Swab  Result Value Ref Range Status   SARS Coronavirus 2 by RT PCR NEGATIVE NEGATIVE Final    Comment: (NOTE) SARS-CoV-2 target nucleic acids are NOT DETECTED. The SARS-CoV-2 RNA is generally detectable in upper respiratoy specimens during the acute phase of infection. The lowest concentration of SARS-CoV-2 viral copies this assay can detect is 131 copies/mL. A negative result does not preclude SARS-Cov-2 infection and should not be used as the sole basis for treatment or other patient management decisions. A negative result may occur with  improper specimen collection/handling, submission of specimen other than nasopharyngeal swab, presence of viral mutation(s) within the areas targeted by this assay, and inadequate number of viral copies (<131 copies/mL). A negative result must be combined with clinical observations, patient history, and epidemiological information. The expected result is Negative. Fact Sheet for Patients:  PinkCheek.be Fact Sheet for Healthcare Providers:  GravelBags.it This test is not yet ap proved or cleared by the [place] FDA and  has been authorized for detection and/or diagnosis of SARS-CoV-2 by FDA under an Emergency Use Authorization (EUA). This EUA will remain  in effect (meaning this test can be used) for the duration of the COVID-19 declaration under Section 564(b)(1) of the Act, 21 U.S.C. section 360bbb-3(b)(1), unless the authorization is terminated or revoked sooner.    Influenza A by PCR NEGATIVE NEGATIVE Final    Influenza B by PCR NEGATIVE NEGATIVE Final    Comment: (NOTE) The Xpert Xpress SARS-CoV-2/FLU/RSV assay is intended as an aid in  the diagnosis of influenza from Nasopharyngeal swab specimens and  should not be used as a sole basis for treatment. Nasal washings and  aspirates are unacceptable for Xpert Xpress SARS-CoV-2/FLU/RSV  testing. Fact Sheet for Patients: PinkCheek.be Fact Sheet for Healthcare Providers: GravelBags.it This test is not yet approved or cleared by the [place] FDA and  has been authorized for detection and/or diagnosis of SARS-CoV-2 by  FDA under an Emergency Use Authorization (EUA). This EUA will remain  in effect (meaning this test can be used) for the duration of the  Covid-19 declaration under Section 564(b)(1) of the Act, 21  U.S.C. section 360bbb-3(b)(1), unless the authorization is  terminated or revoked. Performed at [facility], [place] [address] [place], [place] [address]   MRSA PCR Screening     Status: None   Collection Time: 10/13/19 11:03 PM   Specimen: Nasal Mucosa; Nasopharyngeal  Result Value Ref Range Status   MRSA by PCR NEGATIVE NEGATIVE Final    Comment:        The GeneXpert MRSA Assay (FDA approved for NASAL specimens only), is one component of a comprehensive MRSA colonization surveillance program. It is not intended to diagnose MRSA infection nor to guide or monitor treatment for MRSA infections. Performed at [facility], [place] [address] [place], [place] [address]          Radiology Studies: VAS [name] IVC/ILIAC (VENOUS ONLY)  Result Date: 10/15/2019 IVC/ILIAC STUDY Other Factors: Bilateral lower extremity DVT. IVC filter, history of PE.  Comparison Study: No prior study. Performing Technologist: Maudry Mayhew MHA, RDMS, RVT, RDCS  Examination Guidelines: A complete evaluation includes B-mode imaging, spectral Doppler, color  Doppler, and power Doppler as needed of all accessible portions of each vessel. Bilateral testing is considered an integral part of a complete examination. Limited examinations  for reoccurring indications may be performed as noted.  Abdominal Aorta Findings: +--------+-------+----------+----------+--------+--------+--------+ LocationAP (cm)Trans (cm)PSV (cm/s)WaveformThrombusComments +--------+-------+----------+----------+--------+--------+--------+ Distal  3.76             90                                 +--------+-------+----------+----------+--------+--------+--------+ IVC/Iliac Findings: +----------+------+--------+--------------+    IVC    PatentThrombus   Comments    +----------+------+--------+--------------+ IVC Prox                Not visualized +----------+------+--------+--------------+ IVC Mid          acute                 +----------+------+--------+--------------+ IVC Distal       acute                 +----------+------+--------+--------------+  +-------------------+---------+-----------+---------+-----------+--------+         CIV        RT-PatentRT-ThrombusLT-PatentLT-ThrombusComments +-------------------+---------+-----------+---------+-----------+--------+ Common Iliac Distal   No       acute      No       acute            +-------------------+---------+-----------+---------+-----------+--------+  +-------------------------+---------+-----------+---------+-----------+--------+            EIV           RT-PatentRT-ThrombusLT-PatentLT-ThrombusComments +-------------------------+---------+-----------+---------+-----------+--------+ External Iliac Vein         No       acute      No       acute            Distal                                                                    +-------------------------+---------+-----------+---------+-----------+--------+   Summary: Abdominal Aorta: There is evidence of abnormal dilatation of  the distal Abdominal aorta.  IVC/Iliac: There is evidence of acute thrombus involving the IVC. There is evidence of acute thrombus involving the right common iliac vein, left common iliac vein, right external iliac vein, left external iliac vein. Visualization of proximal Inferior Vena Cava was limited.  *See table(s) above for measurements and observations.  Electronically signed by Deitra Mayo MD on 10/15/2019 at 6:50:16 AM.    Final    VAS [name] LOWER EXTREMITY VENOUS (DVT)  Result Date: 10/15/2019  Lower Venous DVTStudy Indications: Swelling, and IVC filter. History of PE.  Comparison Study: No prior study Performing Technologist: Maudry Mayhew MHA, RDMS, RVT, RDCS  Examination Guidelines: A complete evaluation includes B-mode imaging, spectral Doppler, color Doppler, and power Doppler as needed of all accessible portions of each vessel. Bilateral testing is considered an integral part of a complete examination. Limited examinations for reoccurring indications may be performed as noted. The reflux portion of the exam is performed with the patient in reverse Trendelenburg.  +---------+---------------+---------+-----------+----------+--------------+ RIGHT    CompressibilityPhasicitySpontaneityPropertiesThrombus Aging +---------+---------------+---------+-----------+----------+--------------+ CFV      None                    No                   Acute          +---------+---------------+---------+-----------+----------+--------------+  SFJ      None                                         Acute          +---------+---------------+---------+-----------+----------+--------------+ FV Prox  None                                         Acute          +---------+---------------+---------+-----------+----------+--------------+ FV Mid   None                                         Acute          +---------+---------------+---------+-----------+----------+--------------+ FV  DistalNone                                         Acute          +---------+---------------+---------+-----------+----------+--------------+ PFV      None                                         Acute          +---------+---------------+---------+-----------+----------+--------------+ POP      None                    No                   Acute          +---------+---------------+---------+-----------+----------+--------------+ PTV      None                    No                   Acute          +---------+---------------+---------+-----------+----------+--------------+ PERO     None                    No                   Acute          +---------+---------------+---------+-----------+----------+--------------+ Gastroc  None                    No                   Acute          +---------+---------------+---------+-----------+----------+--------------+   +---------+---------------+---------+-----------+----------+--------------+ LEFT     CompressibilityPhasicitySpontaneityPropertiesThrombus Aging +---------+---------------+---------+-----------+----------+--------------+ CFV      None                    No                   Acute          +---------+---------------+---------+-----------+----------+--------------+ SFJ      None  Acute          +---------+---------------+---------+-----------+----------+--------------+ FV Prox  None                                         Acute          +---------+---------------+---------+-----------+----------+--------------+ FV Mid   None                                         Acute          +---------+---------------+---------+-----------+----------+--------------+ FV DistalNone                                         Acute          +---------+---------------+---------+-----------+----------+--------------+ PFV      None                                          Acute          +---------+---------------+---------+-----------+----------+--------------+ POP      None                    No                   Acute          +---------+---------------+---------+-----------+----------+--------------+ Gastroc  None                    No                   Acute          +---------+---------------+---------+-----------+----------+--------------+   Left Technical Findings: Not visualized segments include Left posterior tibial and peroneal veins.   Summary: RIGHT: - Findings consistent with acute deep vein thrombosis involving the right common femoral vein, SF junction, right femoral vein, right proximal profunda vein, right popliteal vein, right posterior tibial veins, right peroneal veins, and right gastrocnemius veins. - No cystic structure found in the popliteal fossa.  LEFT: - Findings consistent with acute deep vein thrombosis involving the left common femoral vein, SF junction, left femoral vein, left proximal profunda vein, left popliteal vein, and left gastrocnemius veins. - No cystic structure found in the popliteal fossa.  *See table(s) above for measurements and observations. Electronically signed by Deitra Mayo MD on 10/15/2019 at 6:50:34 AM.    Final         Scheduled Meds: . apixaban  10 mg Oral BID   Followed by  . [START ON 10/22/2019] apixaban  5 mg Oral BID  . atorvastatin  10 mg Oral QPM  . Chlorhexidine Gluconate Cloth  6 each Topical Daily  . docusate sodium  200 mg Oral Daily  . insulin aspart  0-15 Units Subcutaneous TID WC  . insulin aspart  0-5 Units Subcutaneous QHS  . mouth rinse  15 mL Mouth Rinse BID  . pantoprazole (PROTONIX) IV  40 mg Intravenous Q12H  . senna  2 tablet Oral QHS  . vitamin B-12  100 mcg Oral Daily   Continuous Infusions: . ceFEPime (MAXIPIME) IV Stopped (10/16/19 0933)  . metronidazole  Stopped (10/16/19 [id number])  . vancomycin Stopped (10/16/19 1315)     LOS: 3 days     Georgette Shell, MD 10/16/2019, 1:47 PM

## 2019-10-16 NOTE — TOC Initial Note (Signed)
Transition of Care [facility]) - Initial/Assessment Note    Patient Details  Name: Matthew Zimmerman MRN: [id number] Date of Birth: 05-12-1946  Transition of Care [facility]) CM/SW Contact:    Lynnell Catalan, RN Phone Number: 10/16/2019, 2:26 PM  Clinical Narrative:                 Pt from home with wife. PT recommendations gone over with wife via phone. Wife states that pt will need to go to rehab for a short time to get stronger. Permission received to fax pt info out to area SNFs. Auth started with [facility]. TOC will follow up with bed offers.  Expected Discharge Plan: Skilled Nursing Facility Barriers to Discharge: Continued Medical Work up   Expected Discharge Plan and Services Expected Discharge Plan: [place]       Living arrangements for the past 2 months: Single Family Home Expected Discharge Date: (unknown)                  Prior Living Arrangements/Services Living arrangements for the past 2 months: Single Family Home Lives with:: Spouse                   Activities of Daily Living Home Assistive Devices/Equipment: CBG Meter, Eyeglasses, Radio producer (specify quad or straight), Walker (specify type), Other (Comment)(3 single point canes, front wheeled walker, tub seat, tub/shower unit) ADL Screening (condition at time of admission) Patient's cognitive ability adequate to safely complete daily activities?: Yes Is the patient deaf or have difficulty hearing?: No Does the patient have difficulty seeing, even when wearing glasses/contacts?: No Does the patient have difficulty concentrating, remembering, or making decisions?: No Patient able to express need for assistance with ADLs?: Yes Does the patient have difficulty dressing or bathing?: Yes Independently performs ADLs?: No Communication: Independent Dressing (OT): Needs assistance Is this a change from baseline?: Pre-admission baseline Grooming: Needs assistance Is this a change from baseline?: Pre-admission  baseline Feeding: Needs assistance Is this a change from baseline?: Pre-admission baseline Bathing: Needs assistance Is this a change from baseline?: Pre-admission baseline Toileting: Needs assistance Is this a change from baseline?: Pre-admission baseline In/Out Bed: Needs assistance Is this a change from baseline?: Pre-admission baseline Walks in Home: Needs assistance Is this a change from baseline?: Pre-admission baseline Does the patient have difficulty walking or climbing stairs?: Yes(secondary to weakness and shortness of breath) Weakness of Legs: Both Weakness of Arms/Hands: None  Permission Sought/Granted                  Emotional Assessment              Admission diagnosis:  Generalized weakness [R53.1] AKI (acute kidney injury) ([place]) [N17.9] Severe sepsis ([place]) [A41.9, R65.20] Patient Active Problem List   Diagnosis Date Noted  . Generalized weakness   . Acute renal failure ([place])   . SOB (shortness of breath)   . Iron deficiency anemia   . Insulin dependent type 2 diabetes mellitus ([place])   . History of pulmonary embolus (PE)   . Acute deep vein thrombosis (DVT) of both lower extremities (HCC)   . Severe sepsis ([place]) 10/13/2019  . AKI (acute kidney injury) ([place]) 10/13/2019  . Metabolic acidosis XX123456  . Acute metabolic encephalopathy XX123456  . Emesis 10/13/2019  . GI (gastrointestinal bleed) 09/30/2019  . Syncope 09/30/2019  . Anemia due to acute blood loss 09/18/2019  . Rectal bleed 09/18/2019  . Pulmonary embolism ([place])   . Plantar fasciitis of right  foot 05/21/2018  . Neck pain 05/20/2014  . Intractable back pain 05/20/2014  . Warfarin-induced coagulopathy ([place]) 05/20/2014  . Diabetes ([place]) 05/12/2010  . Essential hypertension 05/12/2010  . GERD 05/12/2010  . COLONIC POLYPS, HX OF 05/12/2010   PCP:  Jani Gravel, MD Pharmacy:   [facility] [place], [address] [place] [address] Phone: [phone] Fax: [phone]     Social Determinants of Health (SDOH) Interventions    Readmission Risk Interventions Readmission Risk Prevention Plan 10/16/2019  Transportation Screening Complete  PCP or Specialist Appt within 3-5 Days Complete  HRI or [place] Complete  Social Work Consult for [place] Planning/Counseling Complete  Palliative Care Screening Not Applicable  Medication Review Press photographer) Complete  Some recent data might be hidden

## 2019-10-16 NOTE — NC FL2 (Signed)
[place] LEVEL OF CARE SCREENING TOOL     IDENTIFICATION  Patient Name: Matthew Zimmerman Birthdate: [birth year] Sex: male Admission Date (Current Location): 10/13/2019  [facility] and [place] Number:  Herbalist and Address:  [facility],  [address], [place]      Provider Number: [id number]  Attending Physician Name and Address:  Georgette Shell, MD  Relative Name and Phone Number:  Rylon Janey [phone]    Current Level of Care: Hospital Recommended Level of Care: [place] Prior Approval Number:    Date Approved/Denied:   PASRR Number:  [id number]  Discharge Plan:      Current Diagnoses: Patient Active Problem List   Diagnosis Date Noted  . Generalized weakness   . Acute renal failure ([place])   . SOB (shortness of breath)   . Iron deficiency anemia   . Insulin dependent type 2 diabetes mellitus ([place])   . History of pulmonary embolus (PE)   . Acute deep vein thrombosis (DVT) of both lower extremities (HCC)   . Severe sepsis ([place]) 10/13/2019  . AKI (acute kidney injury) ([place]) 10/13/2019  . Metabolic acidosis XX123456  . Acute metabolic encephalopathy XX123456  . Emesis 10/13/2019  . GI (gastrointestinal bleed) 09/30/2019  . Syncope 09/30/2019  . Anemia due to acute blood loss 09/18/2019  . Rectal bleed 09/18/2019  . Pulmonary embolism ([place])   . Plantar fasciitis of right foot 05/21/2018  . Neck pain 05/20/2014  . Intractable back pain 05/20/2014  . Warfarin-induced coagulopathy ([place]) 05/20/2014  . Diabetes ([place]) 05/12/2010  . Essential hypertension 05/12/2010  . GERD 05/12/2010  . COLONIC POLYPS, HX OF 05/12/2010    Orientation RESPIRATION BLADDER Height & Weight     Self, Time, Situation, Place  Normal Continent Weight: 86 kg Height:  5\' 8"  (172.7 cm)  BEHAVIORAL SYMPTOMS/MOOD NEUROLOGICAL BOWEL NUTRITION STATUS      Continent Diet  AMBULATORY STATUS COMMUNICATION OF  NEEDS Skin   Extensive Assist Verbally Normal                       Personal Care Assistance Level of Assistance  Bathing, Dressing Bathing Assistance: Maximum assistance   Dressing Assistance: Maximum assistance     Functional Limitations Info  Sight Sight Info: Impaired        SPECIAL CARE FACTORS FREQUENCY  PT (By licensed PT), OT (By licensed OT)     PT Frequency: 5 x weekly OT Frequency: 5 x weekly            Contractures Contractures Info: Not present    Additional Factors Info  Code Status, Allergies Code Status Info: Full Allergies Info: Cortisone           Current Medications (10/16/2019):  This is the current hospital active medication list Current Facility-Administered Medications  Medication Dose Route Frequency Provider Last Rate Last Admin  . acetaminophen (TYLENOL) tablet 650 mg  650 mg Oral Q6H PRN Shela Leff, MD       Or  . acetaminophen (TYLENOL) suppository 650 mg  650 mg Rectal Q6H PRN Shela Leff, MD      . apixaban (ELIQUIS) tablet 10 mg  10 mg Oral BID Leodis Sias T, RPH   10 mg at 10/16/19 0900   Followed by  . [START ON 10/22/2019] apixaban (ELIQUIS) tablet 5 mg  5 mg Oral BID Eudelia Bunch, RPH      . atorvastatin (LIPITOR) tablet 10  mg  10 mg Oral QPM Shela Leff, MD   10 mg at 10/15/19 1732  . ceFEPIme (MAXIPIME) 2 g in sodium chloride 0.9 % 100 mL IVPB  2 g Intravenous Q12H Lynelle Doctor, RPH   Stopped at 10/16/19 0933  . Chlorhexidine Gluconate Cloth 2 % PADS 6 each  6 each Topical Daily Jackelyn Knife, MD   6 each at 10/16/19 0901  . docusate sodium (COLACE) capsule 200 mg  200 mg Oral Daily Georgette Shell, MD   200 mg at 10/16/19 0900  . insulin aspart (novoLOG) injection 0-15 Units  0-15 Units Subcutaneous TID WC Shela Leff, MD   8 Units at 10/16/19 1153  . insulin aspart (novoLOG) injection 0-5 Units  0-5 Units Subcutaneous QHS Shela Leff, MD   2 Units at 10/15/19 2151  .  MEDLINE mouth rinse  15 mL Mouth Rinse BID Eugenie Filler, MD   15 mL at 10/16/19 0901  . metroNIDAZOLE (FLAGYL) IVPB 500 mg  500 mg Intravenous Q8H Shela Leff, MD 100 mL/hr at 10/16/19 1407 500 mg at 10/16/19 1407  . morphine 2 MG/ML injection 1 mg  1 mg Intravenous Q4H PRN Shela Leff, MD   1 mg at 10/16/19 0518  . ondansetron (ZOFRAN) injection 4 mg  4 mg Intravenous Q6H PRN Shela Leff, MD      . pantoprazole (PROTONIX) injection 40 mg  40 mg Intravenous Q12H Buccini, Herbie [place], MD   40 mg at 10/16/19 0900  . senna (SENOKOT) tablet 17.2 mg  2 tablet Oral QHS Georgette Shell, MD      . vancomycin (VANCOREADY) IVPB 1500 mg/300 mL  1,500 mg Intravenous Q24H Lynelle Doctor, RPH   Stopped at 10/16/19 1315  . vitamin B-12 (CYANOCOBALAMIN) tablet 100 mcg  100 mcg Oral Daily Shela Leff, MD   100 mcg at 10/16/19 0901     Discharge Medications: Please see discharge summary for a list of discharge medications.  Relevant Imaging Results:  Relevant Lab Results:   Additional Information ss# [id number]  Jaasiel [place], Marjie Skiff, RN

## 2019-10-16 NOTE — Progress Notes (Signed)
Inpatient Diabetes Program Recommendations  AACE/ADA: New Consensus Statement on Inpatient Glycemic Control (2015)  Target Ranges:  Prepandial:   less than 140 mg/dL      Peak postprandial:   less than 180 mg/dL (1-2 hours)      Critically ill patients:  140 - 180 mg/dL   Lab Results  Component Value Date   GLUCAP 268 (H) 10/16/2019   HGBA1C 7.5 (H) 09/19/2019    Review of Glycemic Control  Diabetes history: DM2 Outpatient Diabetes medications: 70/30 35 units in am and 25 units QPM (using s/s), metformin 1000 mg bid Current orders for Inpatient glycemic control: Novolog 0-15 units tidwc and 0-5 units QHS  HgbA1C - 7.5% - good control Blood sugars above goal today. Needs part of basal insulin from mixed insulin taken at home.  Inpatient Diabetes Program Recommendations:     Add Lantus 12 units QHS Add Novolog 4 units tidwc for meal coverage insulin.  Will continue to follow closely.  Thank you. Lorenda Peck, RD, LDN, CDE Inpatient Diabetes Coordinator [phone]

## 2019-10-17 ENCOUNTER — Inpatient Hospital Stay (HOSPITAL_COMMUNITY): Payer: HMO

## 2019-10-17 LAB — GLUCOSE, CAPILLARY: Glucose-Capillary: 238 mg/dL — ABNORMAL HIGH (ref 70–99)

## 2019-10-17 LAB — CBC WITH DIFFERENTIAL/PLATELET
Abs Immature Granulocytes: 0.17 10*3/uL — ABNORMAL HIGH (ref 0.00–0.07)
Abs Immature Granulocytes: 0.16 10*3/uL — ABNORMAL HIGH (ref 0.00–0.07)
Basophils Absolute: 0.1 10*3/uL (ref 0.0–0.1)
Basophils Absolute: 0.1 10*3/uL (ref 0.0–0.1)
Basophils Relative: 1 %
Basophils Relative: 1 %
Eosinophils Absolute: 0.4 10*3/uL (ref 0.0–0.5)
Eosinophils Absolute: 0.4 10*3/uL (ref 0.0–0.5)
Eosinophils Relative: 3 %
Eosinophils Relative: 4 %
HCT: 30.6 % — ABNORMAL LOW (ref 39.0–52.0)
HCT: 31.2 % — ABNORMAL LOW (ref 39.0–52.0)
Hemoglobin: 9.5 g/dL — ABNORMAL LOW (ref 13.0–17.0)
Hemoglobin: 9.9 g/dL — ABNORMAL LOW (ref 13.0–17.0)
Immature Granulocytes: 2 %
Immature Granulocytes: 2 %
Lymphocytes Relative: 16 %
Lymphocytes Relative: 15 %
Lymphs Abs: 1.9 10*3/uL (ref 0.7–4.0)
Lymphs Abs: 1.6 10*3/uL (ref 0.7–4.0)
MCH: 27.6 pg (ref 26.0–34.0)
MCH: 28.5 pg (ref 26.0–34.0)
MCHC: 31 g/dL (ref 30.0–36.0)
MCHC: 31.7 g/dL (ref 30.0–36.0)
MCV: 89 fL (ref 80.0–100.0)
MCV: 89.9 fL (ref 80.0–100.0)
Monocytes Absolute: 1.1 10*3/uL — ABNORMAL HIGH (ref 0.1–1.0)
Monocytes Absolute: 0.9 10*3/uL (ref 0.1–1.0)
Monocytes Relative: 10 %
Monocytes Relative: 9 %
Neutro Abs: 7.9 10*3/uL — ABNORMAL HIGH (ref 1.7–7.7)
Neutro Abs: 7.7 10*3/uL (ref 1.7–7.7)
Neutrophils Relative %: 68 %
Neutrophils Relative %: 69 %
Platelets: 271 10*3/uL (ref 150–400)
Platelets: 310 10*3/uL (ref 150–400)
RBC: 3.44 MIL/uL — ABNORMAL LOW (ref 4.22–5.81)
RBC: 3.47 MIL/uL — ABNORMAL LOW (ref 4.22–5.81)
RDW: 17.2 % — ABNORMAL HIGH (ref 11.5–15.5)
RDW: 17.5 % — ABNORMAL HIGH (ref 11.5–15.5)
WBC: 11.6 10*3/uL — ABNORMAL HIGH (ref 4.0–10.5)
WBC: 10.9 10*3/uL — ABNORMAL HIGH (ref 4.0–10.5)
nRBC: 0.2 % (ref 0.0–0.2)
nRBC: 0.2 % (ref 0.0–0.2)

## 2019-10-17 LAB — SARS CORONAVIRUS 2 (TAT 6-24 HRS): SARS Coronavirus 2: NEGATIVE

## 2019-10-17 MED ORDER — APIXABAN 5 MG PO TABS: 5.0000 mg | ORAL_TABLET | Freq: Two times a day (BID) | ORAL | 1 refills | Status: DC

## 2019-10-17 MED ORDER — ACETAMINOPHEN 325 MG PO TABS: 650.0000 mg | ORAL_TABLET | Freq: Three times a day (TID) | ORAL | 2 refills | Status: DC

## 2019-10-17 MED ORDER — PANTOPRAZOLE SODIUM 40 MG PO TBEC: 40.0000 mg | DELAYED_RELEASE_TABLET | Freq: Two times a day (BID) | ORAL | Status: DC

## 2019-10-17 MED ORDER — SENNA 8.6 MG PO TABS: 2.0000 | ORAL_TABLET | Freq: Every day | ORAL | 0 refills | Status: DC

## 2019-10-17 MED ORDER — FUROSEMIDE 10 MG/ML IJ SOLN
40.0000 mg | Freq: Once | INTRAMUSCULAR | Status: AC
  Administered 2019-10-17: 40 mg via INTRAVENOUS
  Filled 2019-10-17: qty 4

## 2019-10-17 MED ORDER — APIXABAN 5 MG PO TABS: 10.0000 mg | ORAL_TABLET | Freq: Two times a day (BID) | ORAL | 0 refills | Status: DC

## 2019-10-17 NOTE — Discharge Summary (Signed)
Physician Discharge Summary  Terral Glaves [id number] DOB: 04-09-1946 DOA: 10/13/2019  PCP: Jani Gravel, MD  Admit date: 10/13/2019 Discharge date: 10/17/2019  Admitted From: Home Disposition:  Home Recommendations for Outpatient Follow-up:  1. Follow up with PCP in 1-2 weeks 2. Please obtain BMP/CBC in one week 3. Please follow up with GI  Home Health: Yes Equipment/Devices: None Discharge Condition: Stable and improved CODE STATUS: Full code Diet recommendation: Cardiac diet Brief/Interim Summary:74 y.o.malewith medical history significant ofinsulin-dependent diabetes mellitus, hypertension, hyperlipidemia, PE status post IVC filter not on anticoagulation due to history of GI bleed, GERD, recent hospital admissionsfor acute blood loss anemia secondary to GI bleed from colonic AVMs status post blood transfusions presenting to the ED for evaluation of generalized weakness and shortness of breath.Patient states he has not been feeling well for the past 3 days. He is sneezing. Also having shortness of breath. He is having some generalized abdominal discomfort. Reports having nonbloody emesis for the past few days. His stool is dark in color. No diarrhea.Denies fevers, chills, cough, or chest pain. No additional history could be obtained from the patient.  ED Course:Temperature as low as 96 Frectal. Tachycardic and tachypneic on arrival. Blood pressure low on arrival. Not hypoxic. Labs showing WBC count 18.8. Lactic acid 9.3. Hemoglobin 9.8, stable since recent hospital discharge. Bicarb 13, anion gap 23. Blood glucose 314. BUN 52, creatinine 3.5. Baseline creatinine 0.8. Blood ethanol level <10.Acetaminophen level <10.Salicylate level <7.Ammonia level normal. TSH 6.373.Blood culture x2 pending. UA and urine culture pending. SARS-CoV-2 PCR test negative. Influenza panel negative. Chest x-ray showing no acute cardiopulmonary abnormality. Head CT negative  for acute intracranial abnormality. CT abdomen pelvis showing increased attenuation and distention of venous structures inferior to the IVC filter, likely representing acute thrombus. No other acute intra-abdominal abnormality.  Patient received vancomycin, cefepime, metronidazole, and 3 L normal saline bolus  Discharge Diagnoses:  Principal Problem:   Severe sepsis ([place]) Active Problems:   AKI (acute kidney injury) ([place])   Metabolic acidosis   Acute metabolic encephalopathy   Emesis   Generalized weakness   Acute renal failure ([facility])   SOB (shortness of breath)   Iron deficiency anemia   Insulin dependent type 2 diabetes mellitus ([place])   History of pulmonary embolus (PE)   Acute deep vein thrombosis (DVT) of both lower extremities ([facility])  #1 severe sepsis with hypotension present on admission unclear etiology-at the time of admission he was tachycardic tachypneic with hypothermia. He was also found to have elevated lactic acid of 9.3 with leukocytosis with negative chest x-ray.  CT of the abdomen and pelvis did not show anything acute. Patient was started on empiric IV vancomycin cefepime and Flagyl.  UA negative no urinary complaints. Was on Vanco cefepime and Flagyl. MRSA PCR negative  #2 chronic anemia history of recent GI bleed due to colonic AVMs-appreciate GI input.  Patient started on Eliquis 10/15/2019. His hemoglobin on the day of discharge is 9.5.  He received 2 units of packed RBCs.    #3 AKI likely secondary to hypotension and sepsis.  improved with IV hydration.   #4 acute metabolic encephalopathy resolved likely secondary to sepsis.  Resolved  # 5 type 2 diabetes continue home dose of NPH 7030.    #6 history of PE and right lower extremity DVT his anticoagulation was stopped during last hospital stay due to recurrent GI bleed. He had IVC filter placed 10/03/2019. Vascular ultrasound of the IVC there is evidence of acute thrombus involving the IVC  and right  common iliac vein and left common iliac vein right external iliac vein and left external iliac vein. Vascular Doppler of the lower extremities with acute bilateral lower extremity DVT  started Eliquis. Discussed with wife.  #7 deconditioning-patient was seen by PT recommended SNF.  However patient and his wife refused to go to rehab.  She wants him discharged home and patient wants to go home with home health.  I did explain to the patient and his wife that his body is very weak and he has not walked in it will be hard on the family to care for him at home however he and his wife decided to take him home.    Estimated body mass index is 28.83 kg/m as calculated from the following:   Height as of this encounter: 5\' 8"  (1.727 m).   Weight as of this encounter: 86 kg.  Discharge Instructions  Discharge Instructions    Call MD for:  difficulty breathing, headache or visual disturbances   Complete by: As directed    Call MD for:  persistant dizziness or light-headedness   Complete by: As directed    Call MD for:  persistant nausea and vomiting   Complete by: As directed    Call MD for:  severe uncontrolled pain   Complete by: As directed    Call MD for:  temperature >100.4   Complete by: As directed    Diet - low sodium heart healthy   Complete by: As directed    Increase activity slowly   Complete by: As directed      Allergies as of 10/17/2019      Reactions   Cortisone Other (See Comments)   CBG thrown out of control      Medication List    STOP taking these medications   metFORMIN 500 MG tablet Commonly known as: GLUCOPHAGE     TAKE these medications   acetaminophen 325 MG tablet Commonly known as: Tylenol Take 2 tablets (650 mg total) by mouth 3 (three) times daily.   apixaban 5 MG Tabs tablet Commonly known as: ELIQUIS Take 2 tablets (10 mg total) by mouth 2 (two) times daily.   apixaban 5 MG Tabs tablet Commonly known as: ELIQUIS Take 1 tablet (5 mg total)  by mouth 2 (two) times daily. Start taking on: October 22, 2019   aspirin EC 81 MG tablet Take 81 mg by mouth every evening.   atorvastatin 20 MG tablet Commonly known as: LIPITOR Take 10 mg by mouth every evening.   augmented betamethasone dipropionate 0.05 % cream Commonly known as: DIPROLENE-AF Apply 1 application topically daily as needed (itchy skin near bottom).   BD Insulin Syringe U/F 31G X 5/16" 0.5 ML Misc Generic drug: Insulin Syringe-Needle U-100 2 (two) times daily.   clotrimazole-betamethasone cream Commonly known as: LOTRISONE Apply 1 application topically 2 (two) times daily as needed (itching).   diclofenac sodium 1 % Gel Commonly known as: VOLTAREN Apply 1 application topically 4 (four) times daily as needed (pain).   Farxiga 5 MG Tabs tablet Generic drug: dapagliflozin propanediol Take 5 mg by mouth daily.   insulin NPH-regular Human (70-30) 100 UNIT/ML injection Inject 25-35 Units into the skin 2 (two) times daily with a meal. Humulin   metoprolol succinate 25 MG 24 hr tablet Commonly known as: TOPROL-XL Take 1 tablet (25 mg total) by mouth daily. Take with or immediately following a meal. Take at night. What changed:   additional instructions  Another  medication with the same name was removed. Continue taking this medication, and follow the directions you see here.   ONE TOUCH ULTRA TEST test strip Generic drug: glucose blood Use to test blood sugar twice a day   OneTouch Delica Plus 0000000 Misc Use to test blood sugar twice daily   senna 8.6 MG Tabs tablet Commonly known as: SENOKOT Take 2 tablets (17.2 mg total) by mouth at bedtime.   vitamin B-12 100 MCG tablet Commonly known as: CYANOCOBALAMIN Take 100 mcg by mouth daily.   Vitamin D (Ergocalciferol) 1.25 MG (50000 UNIT) Caps capsule Commonly known as: DRISDOL Take 50,000 Units by mouth every 7 (seven) days.            Durable Medical Equipment  (From admission, onward)          Start     Ordered   10/17/19 1038  DME 3-in-1  Once     10/17/19 1040         Follow-up Information    Jani Gravel, MD Follow up.   Specialty: Internal Medicine Contact information: [place] [address] [phone]          Allergies  Allergen Reactions  . Cortisone Other (See Comments)    CBG thrown out of control    Consultations: Dr Cristina Gong  Procedures/Studies: CT ABDOMEN PELVIS WO CONTRAST  Result Date: 10/13/2019 CLINICAL DATA:  Abdominal pain, fever, rectal bleeding EXAM: CT ABDOMEN AND PELVIS WITHOUT CONTRAST TECHNIQUE: Multidetector CT imaging of the abdomen and pelvis was performed following the standard protocol without IV contrast. COMPARISON:  01/16/2018 FINDINGS: Lower chest: No acute pleural or parenchymal lung disease. Hepatobiliary: No focal liver abnormality is seen. No gallstones, gallbladder wall thickening, or biliary dilatation. Pancreas: Unremarkable. No pancreatic ductal dilatation or surrounding inflammatory changes. Spleen: Normal in size without focal abnormality. Adrenals/Urinary Tract: No evidence of urinary tract calculi or obstructive uropathy within either kidney. Stable right renal cyst. Bladder is minimally distended which limits evaluation. The adrenals are unremarkable. Stomach/Bowel: No bowel obstruction or ileus. Normal retrocecal appendix. Vascular/Lymphatic: IVC filter is identified. Within the iliac veins and IVC below the level of the filter, there is increased attenuation, venous distension, and mild surrounding fat stranding which may reflect acute thrombus. This would not be unexpected in a patient with a history of DVT who has recently discontinued anticoagulation due to complications. No pathologic adenopathy within the abdomen or pelvis. Reproductive: Mildly enlarged prostate is otherwise unremarkable. Other: No abdominal wall hernia or abnormality. No abdominopelvic ascites. Musculoskeletal: There  are no acute or destructive bony lesions. Reconstructed images demonstrate no additional findings. IMPRESSION: 1. IVC filter as above, with increased attenuation and distension of the venous structures inferior to the filter likely representing acute thrombus. This is not unexpected in a patient with a history of DVT who has recently discontinued anticoagulation due to bleeding complications. 2. No evidence of urinary tract calculi or obstructive uropathy. 3. Otherwise unremarkable unenhanced exam. Electronically Signed   By: Randa Ngo M.D.   On: 10/13/2019 20:13   DG Chest 2 View  Result Date: 09/30/2019 CLINICAL DATA:  Syncope, GI bleed EXAM: CHEST - 2 VIEW COMPARISON:  Radiograph 10/15/2012 FINDINGS: No consolidation, features of edema, pneumothorax, or effusion. The aorta is calcified. The remaining cardiomediastinal contours are unremarkable. No acute osseous or soft tissue abnormality. Degenerative changes are present in the imaged spine and shoulders. Telemetry leads overlie the chest. IMPRESSION: No acute cardiopulmonary abnormality. Aortic Atherosclerosis (ICD10-I70.0). Electronically  Signed   By: Lovena Le M.D.   On: 09/30/2019 05:02   DG Knee 1-2 Views Right  Result Date: 10/17/2019 CLINICAL DATA:  Pain medially after fall. EXAM: RIGHT KNEE - 1-2 VIEW COMPARISON:  None. FINDINGS: Evaluation of the lateral tibial plateau is limited and ill-defined. The patient's pain is medial by report. There are tricompartmental degenerative changes. No definite fractures are identified. Vascular calcifications are noted. There is a small effusion. IMPRESSION: 1. Evaluation of the lateral tibial plateau is limited in ill-defined, probably positional in etiology. If there is concern laterally in the knee, recommend the standard three-view knee for better evaluation. 2. No definite fracture. Tricompartmental degenerative changes noted. Small effusion. Electronically Signed   By: Dorise Bullion III M.D    On: 10/17/2019 09:50   DG Knee 1-2 Views Right  Result Date: 10/01/2019 CLINICAL DATA:  Right knee and ankle pain since falling several days ago. EXAM: RIGHT KNEE - 1-2 VIEW COMPARISON:  None. FINDINGS: The mineralization and alignment are normal. There is no evidence of acute fracture or dislocation. Mild tricompartmental degenerative changes, greatest in the medial compartment. Small to moderate knee joint effusion. There is patellar spurring and diffuse vascular calcifications. IMPRESSION: No acute osseous findings. Mild degenerative changes and small to moderate joint effusion. Electronically Signed   By: Richardean Sale M.D.   On: 10/01/2019 11:24   DG Ankle 2 Views Right  Result Date: 10/01/2019 CLINICAL DATA:  Right knee and ankle pain since falling several days ago. EXAM: RIGHT ANKLE - 2 VIEW COMPARISON:  Report from right ankle MRI 10/10/2013. FINDINGS: The bones appear adequately mineralized. No evidence of acute fracture or dislocation. There is moderate lateral soft tissue swelling. Prominent calcaneal spurs, appearing mildly fragmented at the Achilles insertion. There are prominent vascular calcifications. IMPRESSION: Lateral soft tissue swelling without evidence of acute fracture or dislocation. Prominent calcaneal spurs. Electronically Signed   By: Richardean Sale M.D.   On: 10/01/2019 11:25   CT Head Wo Contrast  Result Date: 10/13/2019 CLINICAL DATA:  Encephalopathy. Increased weakness and low oxygen levels. Multiple colonoscopies for rectal bleeding. EXAM: CT HEAD WITHOUT CONTRAST TECHNIQUE: Contiguous axial images were obtained from the base of the skull through the vertex without intravenous contrast. COMPARISON:  09/30/2019 FINDINGS: Brain: No evidence of acute infarction, hemorrhage, hydrocephalus, extra-axial collection or mass lesion/mass effect. Stable appearance of tentorial and parafalcine calcifications. Vascular: No hyperdense vessel or unexpected calcification. Skull: Normal.  Negative for fracture or focal lesion. Sinuses/Orbits: No acute finding. Other: None IMPRESSION: No evidence for acute intracranial abnormality. Electronically Signed   By: Nolon Nations M.D.   On: 10/13/2019 20:14   CT Head Wo Contrast  Result Date: 09/30/2019 CLINICAL DATA:  Syncope, woke up on floor, unsure of head injury, recently restarted Xarelto EXAM: CT HEAD WITHOUT CONTRAST TECHNIQUE: Contiguous axial images were obtained from the base of the skull through the vertex without intravenous contrast. COMPARISON:  CT 05/21/2014 FINDINGS: Brain: Diffuse mild parafalcine and tentorial hyperattenuation is unchanged from comparison study 05/21/2014 and likely reflects benign dural calcification. No evidence of acute infarction, hemorrhage, hydrocephalus, extra-axial collection or mass lesion/mass effect. Patchy areas of white matter hypoattenuation are most compatible with chronic microvascular angiopathy. Symmetric prominence of the ventricles, cisterns and sulci compatible with parenchymal volume loss. Vascular: Atherosclerotic calcification of the carotid siphons. No hyperdense vessel. Skull: No significant scalp swelling or hematoma. No calvarial fracture. Sinuses/Orbits: Paranasal sinuses and mastoid air cells are predominantly clear. Orbital structures are unremarkable aside from  prior lens extractions. Other: None IMPRESSION: 1. No acute intracranial abnormality. 2. Slightly hyperattenuating appearance of the falx and tentorium is unchanged from prior and is favored to reflect chronic dural mineralization. 3. Mild chronic microvascular angiopathy and parenchymal volume loss. Electronically Signed   By: Lovena Le M.D.   On: 09/30/2019 05:08   IR IVC FILTER PLMT / S&I Burke Keels GUID/MOD SED  Result Date: 10/03/2019 INDICATION: History of recurrent PE and DVT. Recent GI bleeding requiring discontinuation lifelong anticoagulation EXAM: ULTRASOUND GUIDANCE FOR VASCULAR ACCESS IVC CATHETERIZATION AND  VENOGRAM IVC FILTER INSERTION Date:  10/03/2019 10/03/2019 11:30 am Radiologist:  M. Daryll Brod, MD Guidance:  Ultrasound fluoroscopic CONTRAST:  65 cc Omnipaque 300 MEDICATIONS: 1% lidocaine local ANESTHESIA/SEDATION: 2.0 mg IV Versed; 50 mcg IV Fentanyl Moderate Sedation Time:  12 minutes The patient was continuously monitored during the procedure by the interventional radiology nurse under my direct supervision. FLUOROSCOPY TIME:  Fluoroscopy Time: 1 minutes 0 seconds (185 mGy). COMPLICATIONS: None immediate. PROCEDURE: Informed consent was obtained from the patient following explanation of the procedure, risks, benefits and alternatives. The patient understands, agrees and consents for the procedure. All questions were addressed. A time out was performed. Maximal barrier sterile technique utilized including caps, mask, sterile gowns, sterile gloves, large sterile drape, hand hygiene, and betadine prep. Under sterile condition and local anesthesia, right internal jugular venous access was performed with ultrasound. Images obtained for documentation of the patent right internal jugular vein. Over a guide wire, the IVC filter delivery sheath and inner dilator were advanced into the IVC just above the IVC bifurcation. Contrast injection was performed for an IVC venogram. IVC VENOGRAM: The IVC is patent. No evidence of thrombus, stenosis, or occlusion. No variant venous anatomy. The renal veins are identified at L1-2. IVC FILTER INSERTION: Through the delivery sheath, the Bard Denali IVC filter was deployed in the infrarenal IVC at the L3 level just below the renal veins and above the IVC bifurcation. Contrast injection confirmed position. There is good apposition of the filter against the IVC. The delivery sheath was removed and hemostasis was obtained with compression for 5 minutes. The patient tolerated the procedure well. No immediate complications. IMPRESSION: Ultrasound and fluoroscopically guided infrarenal  IVC filter insertion. PLAN: This IVC filter is potentially retrievable. The patient will be assessed for filter retrieval by Interventional Radiology in approximately 8-12 weeks. Further recommendations regarding filter retrieval, continued surveillance or declaration of device permanence, will be made at that time. Electronically Signed   By: Jerilynn Mages.  Shick M.D.   On: 10/03/2019 11:37   US RENAL  Result Date: 10/13/2019 CLINICAL DATA:  Acute kidney injury EXAM: RENAL / URINARY TRACT ULTRASOUND COMPLETE COMPARISON:  CT same day FINDINGS: Right Kidney: Renal measurements: 10.3 x 5.3 x 5.5 cm = volume: 155 mL . Echogenicity within normal limits. There is an anechoic cyst seen within the right kidney measuring 4.3 x 4.1 x 4.3 cm. Left Kidney: Renal measurements: 10.3 x 5 x 5 x 4.6 cm = volume: 131 mL. Echogenicity within normal limits. No mass or hydronephrosis visualized. Bladder: Appears normal for degree of bladder distention. Other: None. IMPRESSION: Right anechoic cyst measuring 4.3 x 4.1 x 4.3 cm. Otherwise normal renal ultrasound. Electronically Signed   By: Prudencio Pair M.D.   On: 10/13/2019 22:52   VAS [name] IVC/ILIAC (VENOUS ONLY)  Result Date: 10/15/2019 IVC/ILIAC STUDY Other Factors: Bilateral lower extremity DVT. IVC filter, history of PE.  Comparison Study: No prior study. Performing Technologist: Maudry Mayhew MHA,  RDMS, RVT, RDCS  Examination Guidelines: A complete evaluation includes B-mode imaging, spectral Doppler, color Doppler, and power Doppler as needed of all accessible portions of each vessel. Bilateral testing is considered an integral part of a complete examination. Limited examinations for reoccurring indications may be performed as noted.  Abdominal Aorta Findings: +--------+-------+----------+----------+--------+--------+--------+ LocationAP (cm)Trans (cm)PSV (cm/s)WaveformThrombusComments +--------+-------+----------+----------+--------+--------+--------+ Distal  3.76              90                                 +--------+-------+----------+----------+--------+--------+--------+ IVC/Iliac Findings: +----------+------+--------+--------------+    IVC    PatentThrombus   Comments    +----------+------+--------+--------------+ IVC Prox                Not visualized +----------+------+--------+--------------+ IVC Mid          acute                 +----------+------+--------+--------------+ IVC Distal       acute                 +----------+------+--------+--------------+  +-------------------+---------+-----------+---------+-----------+--------+         CIV        RT-PatentRT-ThrombusLT-PatentLT-ThrombusComments +-------------------+---------+-----------+---------+-----------+--------+ Common Iliac Distal   No       acute      No       acute            +-------------------+---------+-----------+---------+-----------+--------+  +-------------------------+---------+-----------+---------+-----------+--------+            EIV           RT-PatentRT-ThrombusLT-PatentLT-ThrombusComments +-------------------------+---------+-----------+---------+-----------+--------+ External Iliac Vein         No       acute      No       acute            Distal                                                                    +-------------------------+---------+-----------+---------+-----------+--------+   Summary: Abdominal Aorta: There is evidence of abnormal dilatation of the distal Abdominal aorta.  IVC/Iliac: There is evidence of acute thrombus involving the IVC. There is evidence of acute thrombus involving the right common iliac vein, left common iliac vein, right external iliac vein, left external iliac vein. Visualization of proximal Inferior Vena Cava was limited.  *See table(s) above for measurements and observations.  Electronically signed by Deitra Mayo MD on 10/15/2019 at 6:50:16 AM.    Final    DG CHEST PORT 1 VIEW  Result  Date: 10/14/2019 CLINICAL DATA:  Short of breath EXAM: PORTABLE CHEST 1 VIEW COMPARISON:  10/13/2019 FINDINGS: The heart size and mediastinal contours are within normal limits. Both lungs are clear. The visualized skeletal structures are unremarkable. IMPRESSION: No active disease. Electronically Signed   By: Kerby Moors M.D.   On: 10/14/2019 10:25   DG Chest Portable 1 View  Result Date: 10/13/2019 CLINICAL DATA:  Shortness of breath, increased weakness and shortness of breath. EXAM: PORTABLE CHEST 1 VIEW COMPARISON:  Chest radiograph 09/30/2019 FINDINGS: Heart size within normal limits. Aortic atherosclerosis. There is no evidence of airspace consolidation. No pleural effusion or pneumothorax.  No acute bony abnormality. IMPRESSION: No evidence of acute cardiopulmonary abnormality. Aortic atherosclerosis. Electronically Signed   By: Kellie Simmering DO   On: 10/13/2019 17:02   VAS [name] LOWER EXTREMITY VENOUS (DVT)  Result Date: 10/15/2019  Lower Venous DVTStudy Indications: Swelling, and IVC filter. History of PE.  Comparison Study: No prior study Performing Technologist: Maudry Mayhew MHA, RDMS, RVT, RDCS  Examination Guidelines: A complete evaluation includes B-mode imaging, spectral Doppler, color Doppler, and power Doppler as needed of all accessible portions of each vessel. Bilateral testing is considered an integral part of a complete examination. Limited examinations for reoccurring indications may be performed as noted. The reflux portion of the exam is performed with the patient in reverse Trendelenburg.  +---------+---------------+---------+-----------+----------+--------------+ RIGHT    CompressibilityPhasicitySpontaneityPropertiesThrombus Aging +---------+---------------+---------+-----------+----------+--------------+ CFV      None                    No                   Acute          +---------+---------------+---------+-----------+----------+--------------+ SFJ      None                                          Acute          +---------+---------------+---------+-----------+----------+--------------+ FV Prox  None                                         Acute          +---------+---------------+---------+-----------+----------+--------------+ FV Mid   None                                         Acute          +---------+---------------+---------+-----------+----------+--------------+ FV DistalNone                                         Acute          +---------+---------------+---------+-----------+----------+--------------+ PFV      None                                         Acute          +---------+---------------+---------+-----------+----------+--------------+ POP      None                    No                   Acute          +---------+---------------+---------+-----------+----------+--------------+ PTV      None                    No                   Acute          +---------+---------------+---------+-----------+----------+--------------+ PERO     None  No                   Acute          +---------+---------------+---------+-----------+----------+--------------+ Gastroc  None                    No                   Acute          +---------+---------------+---------+-----------+----------+--------------+   +---------+---------------+---------+-----------+----------+--------------+ LEFT     CompressibilityPhasicitySpontaneityPropertiesThrombus Aging +---------+---------------+---------+-----------+----------+--------------+ CFV      None                    No                   Acute          +---------+---------------+---------+-----------+----------+--------------+ SFJ      None                                         Acute          +---------+---------------+---------+-----------+----------+--------------+ FV Prox  None                                         Acute           +---------+---------------+---------+-----------+----------+--------------+ FV Mid   None                                         Acute          +---------+---------------+---------+-----------+----------+--------------+ FV DistalNone                                         Acute          +---------+---------------+---------+-----------+----------+--------------+ PFV      None                                         Acute          +---------+---------------+---------+-----------+----------+--------------+ POP      None                    No                   Acute          +---------+---------------+---------+-----------+----------+--------------+ Gastroc  None                    No                   Acute          +---------+---------------+---------+-----------+----------+--------------+   Left Technical Findings: Not visualized segments include Left posterior tibial and peroneal veins.   Summary: RIGHT: - Findings consistent with acute deep vein thrombosis involving the right common femoral vein, SF junction, right femoral vein, right proximal profunda vein, right popliteal vein, right posterior tibial veins, right peroneal veins, and right gastrocnemius veins. - No cystic structure found in  the popliteal fossa.  LEFT: - Findings consistent with acute deep vein thrombosis involving the left common femoral vein, SF junction, left femoral vein, left proximal profunda vein, left popliteal vein, and left gastrocnemius veins. - No cystic structure found in the popliteal fossa.  *See table(s) above for measurements and observations. Electronically signed by Deitra Mayo MD on 10/15/2019 at 6:50:34 AM.    Final    (Echo, Carotid, EGD, Colonoscopy, ERCP)    Subjective:  Resting in bed in nad wants to go home anxious to go home Though he knows he is not able to walk yet.  Discharge Exam: Vitals:   10/17/19 0900 10/17/19 1000  BP: 140/64 115/63  Pulse: 92 89   Resp: 20 17  Temp:    SpO2: 95% 98%   Vitals:   10/17/19 0700 10/17/19 0800 10/17/19 0900 10/17/19 1000  BP: 124/62 (!) 152/67 140/64 115/63  Pulse: 79 91 92 89  Resp: 12 14 20 17   Temp:  98.6 F (37 C)    TempSrc:  Oral    SpO2: 99% 97% 95% 98%  Weight:      Height:        General: Pt is alert, awake, not in acute distress Cardiovascular: RRR, S1/S2 +, no rubs, no gallops Respiratory: CTA bilaterally, no wheezing, no rhonchi Abdominal: Soft, NT, ND, bowel sounds + Extremities: 2+ pitting edema   The results of significant diagnostics from this hospitalization (including imaging, microbiology, ancillary and laboratory) are listed below for reference.     Microbiology: Recent Results (from the past 240 hour(s))  Urine culture     Status: None   Collection Time: 10/13/19  4:36 PM   Specimen: Urine, Clean Catch  Result Value Ref Range Status   Specimen Description   Final    URINE, CLEAN CATCH Performed at [facility], [place] [address] [place], [place] [address]    Special Requests   Final    NONE Performed at [facility], [place] [address] [place], [place] [address]    Culture   Final    NO GROWTH Performed at [facility], [place] [address] [place], [place] [address]    Report Status 10/15/2019 FINAL  Final  Culture, blood (routine x 2)     Status: None (Preliminary result)   Collection Time: 10/13/19  4:37 PM   Specimen: BLOOD  Result Value Ref Range Status   Specimen Description   Final    BLOOD RIGHT ANTECUBITAL Performed at [place] [address] [place], [place] [address]    Special Requests   Final    BOTTLES DRAWN AEROBIC ONLY Blood Culture results may not be optimal due to an inadequate volume of blood received in culture bottles Performed at [place] [address] [place], [place] [address]    Culture   Final    NO GROWTH 4 DAYS Performed at [facility], [place] [address] [place], [place] [address]    Report Status PENDING  Incomplete  Culture, blood (routine x 2)     Status: None (Preliminary result)   Collection Time: 10/13/19  4:42 PM   Specimen: BLOOD  Result Value Ref Range Status   Specimen Description   Final    BLOOD LEFT ANTECUBITAL Performed at [place] [address], [place],  [address]    Special Requests   Final    BOTTLES DRAWN AEROBIC AND ANAEROBIC Blood Culture  results may not be optimal due to an inadequate volume of blood received in culture bottles Performed at [facility], [place] [address], [place], [place] [address]    Culture   Final    NO GROWTH 4 DAYS Performed at [facility], [place] [address], [place], [place] [address]    Report Status PENDING  Incomplete  Respiratory Panel by RT PCR (Flu A&B, Covid) - Nasopharyngeal Swab     Status: None   Collection Time: 10/13/19  4:50 PM   Specimen: Nasopharyngeal Swab  Result Value Ref Range Status   SARS Coronavirus 2 by RT PCR NEGATIVE NEGATIVE Final    Comment: (NOTE) SARS-CoV-2 target nucleic acids are NOT DETECTED. The SARS-CoV-2 RNA is generally detectable in upper respiratoy specimens during the acute phase of infection. The lowest concentration of SARS-CoV-2 viral copies this assay can detect is 131 copies/mL. A negative result does not preclude SARS-Cov-2 infection and should not be used as the sole basis for treatment or other patient management decisions. A negative result may occur with  improper specimen collection/handling, submission of specimen other than nasopharyngeal swab, presence of viral mutation(s) within the areas targeted by this assay, and inadequate number of viral copies (<131 copies/mL). A negative result must be combined with clinical observations, patient history, and epidemiological information. The expected result is Negative. Fact Sheet for Patients:   PinkCheek.be Fact Sheet for Healthcare Providers:  GravelBags.it This test is not yet ap proved or cleared by the [place] FDA and  has been authorized for detection and/or diagnosis of SARS-CoV-2 by FDA under an Emergency Use Authorization (EUA). This EUA will remain  in effect (meaning this test can be used) for the duration of the COVID-19 declaration under Section 564(b)(1) of the Act, 21 U.S.C. section 360bbb-3(b)(1), unless the authorization is terminated or revoked sooner.    Influenza A by PCR NEGATIVE NEGATIVE Final   Influenza B by PCR NEGATIVE NEGATIVE Final    Comment: (NOTE) The Xpert Xpress SARS-CoV-2/FLU/RSV assay is intended as an aid in  the diagnosis of influenza from Nasopharyngeal swab specimens and  should not be used as a sole basis for treatment. Nasal washings and  aspirates are unacceptable for Xpert Xpress SARS-CoV-2/FLU/RSV  testing. Fact Sheet for Patients: PinkCheek.be Fact Sheet for Healthcare Providers: GravelBags.it This test is not yet approved or cleared by the [place] FDA and  has been authorized for detection and/or diagnosis of SARS-CoV-2 by  FDA under an Emergency Use Authorization (EUA). This EUA will remain  in effect (meaning this test can be used) for the duration of the  Covid-19 declaration under Section 564(b)(1) of the Act, 21  U.S.C. section 360bbb-3(b)(1), unless the authorization is  terminated or revoked. Performed at [facility], [place] [address], [place], [place] [address]   MRSA PCR Screening     Status: None   Collection Time: 10/13/19 11:03 PM   Specimen: Nasal Mucosa; Nasopharyngeal  Result Value Ref Range Status   MRSA by PCR NEGATIVE NEGATIVE Final    Comment:        The GeneXpert MRSA Assay (FDA approved for NASAL specimens only), is one component of a comprehensive MRSA  colonization surveillance program. It is not intended to diagnose MRSA infection nor to guide or monitor treatment for MRSA infections. Performed at [facility], [place] [address] [place], [place] [address]   SARS CORONAVIRUS 2 (TAT 6-24 HRS) Nasopharyngeal Nasopharyngeal Swab     Status:  None   Collection Time: 10/16/19  4:03 PM   Specimen: Nasopharyngeal Swab  Result Value Ref Range Status   SARS Coronavirus 2 NEGATIVE NEGATIVE Final    Comment: (NOTE) SARS-CoV-2 target nucleic acids are NOT DETECTED. The SARS-CoV-2 RNA is generally detectable in upper and lower respiratory specimens during the acute phase of infection. Negative results do not preclude SARS-CoV-2 infection, do not rule out co-infections with other pathogens, and should not be used as the sole basis for treatment or other patient management decisions. Negative results must be combined with clinical observations, patient history, and epidemiological information. The expected result is Negative. Fact Sheet for Patients: SugarRoll.be Fact Sheet for Healthcare Providers: https://www.woods-.com/ This test is not yet approved or cleared by the [place] FDA and  has been authorized for detection and/or diagnosis of SARS-CoV-2 by FDA under an Emergency Use Authorization (EUA). This EUA will remain  in effect (meaning this test can be used) for the duration of the COVID-19 declaration under Section 56 4(b)(1) of the Act, 21 U.S.C. section 360bbb-3(b)(1), unless the authorization is terminated or revoked sooner. Performed at [facility], [place] [address], [place], [place] [address]      Labs: BNP (last 3 results) No results for input(s): BNP in the last 8760 hours. Basic Metabolic Panel: Recent Labs  Lab 10/13/19 1636 10/13/19 2310 10/14/19 0245 10/15/19 0217 10/16/19 0208  NA 136 138 140 138 134*  K 4.1 5.1 4.1 4.1 4.1  CL 100 108  111 112* 108  CO2 13* 15* 16* 17* 17*  GLUCOSE 314* 145* 101* 149* 212*  BUN 52* 49* 49* 40* 30*  CREATININE 3.56* 2.83* 2.64* 1.71* 1.27*  CALCIUM 9.3 8.3* 7.9* 8.3* 8.3*   Liver Function Tests: Recent Labs  Lab 10/13/19 1636 10/16/19 0208  AST 23 11*  ALT 15 12  ALKPHOS 78 56  BILITOT 1.3* 1.2  PROT 8.4* 6.6  ALBUMIN 4.0 3.1*   No results for input(s): LIPASE, AMYLASE in the last 168 hours. Recent Labs  Lab 10/13/19 1650  AMMONIA 23   CBC: Recent Labs  Lab 10/15/19 0217 10/15/19 1420 10/16/19 0208 10/16/19 1418 10/17/19 0221  WBC 10.0 10.4 10.8*  10.9* 11.8* 11.6*  NEUTROABS 6.5 7.3 7.8* 8.6* 7.9*  HGB 7.0* 9.4* 9.6*  9.7* 10.0* 9.5*  HCT 23.5* 30.9* 30.3*  30.4* 33.1* 30.6*  MCV 90.0 92.5 90.2  90.5 94.3 89.0  PLT 204 206 211  205 240 271   Cardiac Enzymes: Recent Labs  Lab 10/14/19 0007  CKTOTAL 294   BNP: Invalid input(s): POCBNP CBG: Recent Labs  Lab 10/16/19 0739 10/16/19 1145 10/16/19 1609 10/16/19 2110 10/17/19 0807  GLUCAP 250* 268* 219* 225* 238*   D-Dimer No results for input(s): DDIMER in the last 72 hours. Hgb A1c No results for input(s): HGBA1C in the last 72 hours. Lipid Profile No results for input(s): CHOL, HDL, LDLCALC, TRIG, CHOLHDL, LDLDIRECT in the last 72 hours. Thyroid function studies No results for input(s): TSH, T4TOTAL, T3FREE, THYROIDAB in the last 72 hours.  Invalid input(s): FREET3 Anemia work up No results for input(s): VITAMINB12, FOLATE, FERRITIN, TIBC, IRON, RETICCTPCT in the last 72 hours. Urinalysis    Component Value Date/Time   COLORURINE YELLOW 10/13/2019 1932   APPEARANCEUR HAZY (A) 10/13/2019 1932   LABSPEC 1.016 10/13/2019 1932   PHURINE 5.0 10/13/2019 1932   GLUCOSEU 50 (A) 10/13/2019 1932   HGBUR NEGATIVE 10/13/2019 [place] NEGATIVE 10/13/2019 1932   BILIRUBINUR neg  10/15/2012 [place] 10/13/2019 [place] 10/13/2019 1932   UROBILINOGEN 1.0  05/19/2014 1814   NITRITE NEGATIVE 10/13/2019 1932   LEUKOCYTESUR NEGATIVE 10/13/2019 1932   Sepsis Labs Invalid input(s): PROCALCITONIN,  WBC,  LACTICIDVEN Microbiology Recent Results (from the past 240 hour(s))  Urine culture     Status: None   Collection Time: 10/13/19  4:36 PM   Specimen: Urine, Clean Catch  Result Value Ref Range Status   Specimen Description   Final    URINE, CLEAN CATCH Performed at [facility], [place] [address], [place], [place] [address]    Special Requests   Final    NONE Performed at [facility], [place] [address] [place], [place] [address]    Culture   Final    NO GROWTH Performed at [facility], [place] [address], [place], [place] [address]    Report Status 10/15/2019 FINAL  Final  Culture, blood (routine x 2)     Status: None (Preliminary result)   Collection Time: 10/13/19  4:37 PM   Specimen: BLOOD  Result Value Ref Range Status   Specimen Description   Final    BLOOD RIGHT ANTECUBITAL Performed at [place] [address] [place], [place] [address]    Special Requests   Final    BOTTLES DRAWN AEROBIC ONLY Blood Culture results may not be optimal due to an inadequate volume of blood received in culture bottles Performed at [place] [address] [place], [place] [address]    Culture   Final    NO GROWTH 4 DAYS Performed at [facility], [place] [address] [place], [place] [address]    Report Status PENDING  Incomplete  Culture, blood (routine x 2)     Status: None (Preliminary result)   Collection Time: 10/13/19  4:42 PM   Specimen: BLOOD  Result Value Ref Range Status   Specimen Description   Final    BLOOD LEFT ANTECUBITAL Performed at [place] [address], [place], [place] [address]    Special Requests   Final    BOTTLES DRAWN AEROBIC AND ANAEROBIC Blood Culture results may not be optimal due to an inadequate  volume of blood received in culture bottles Performed at [place] [address], [place], [place] [address]    Culture   Final    NO GROWTH 4 DAYS Performed at [facility], [place] [address], [place], [place] [address]    Report Status PENDING  Incomplete  Respiratory Panel by RT PCR (Flu A&B, Covid) - Nasopharyngeal Swab     Status: None   Collection Time: 10/13/19  4:50 PM   Specimen: Nasopharyngeal Swab  Result Value Ref Range Status   SARS Coronavirus 2 by RT PCR NEGATIVE NEGATIVE Final    Comment: (NOTE) SARS-CoV-2 target nucleic acids are NOT DETECTED. The SARS-CoV-2 RNA is generally detectable in upper respiratoy specimens during the acute phase of infection. The lowest concentration of SARS-CoV-2 viral copies this assay can detect is 131 copies/mL. A negative result does not preclude SARS-Cov-2 infection and should not be used as the sole basis for treatment or other patient management decisions. A negative result may occur with  improper specimen collection/handling, submission of specimen other than nasopharyngeal swab, presence of viral mutation(s) within the areas targeted by this assay, and inadequate number of viral copies (<131 copies/mL). A negative result must  be combined with clinical observations, patient history, and epidemiological information. The expected result is Negative. Fact Sheet for Patients:  PinkCheek.be Fact Sheet for Healthcare Providers:  GravelBags.it This test is not yet ap proved or cleared by the [place] FDA and  has been authorized for detection and/or diagnosis of SARS-CoV-2 by FDA under an Emergency Use Authorization (EUA). This EUA will remain  in effect (meaning this test can be used) for the duration of the COVID-19 declaration under Section 564(b)(1) of the Act, 21 U.S.C. section 360bbb-3(b)(1), unless the authorization is terminated or revoked  sooner.    Influenza A by PCR NEGATIVE NEGATIVE Final   Influenza B by PCR NEGATIVE NEGATIVE Final    Comment: (NOTE) The Xpert Xpress SARS-CoV-2/FLU/RSV assay is intended as an aid in  the diagnosis of influenza from Nasopharyngeal swab specimens and  should not be used as a sole basis for treatment. Nasal washings and  aspirates are unacceptable for Xpert Xpress SARS-CoV-2/FLU/RSV  testing. Fact Sheet for Patients: PinkCheek.be Fact Sheet for Healthcare Providers: GravelBags.it This test is not yet approved or cleared by the [place] FDA and  has been authorized for detection and/or diagnosis of SARS-CoV-2 by  FDA under an Emergency Use Authorization (EUA). This EUA will remain  in effect (meaning this test can be used) for the duration of the  Covid-19 declaration under Section 564(b)(1) of the Act, 21  U.S.C. section 360bbb-3(b)(1), unless the authorization is  terminated or revoked. Performed at [facility], [place] [address] [place], [place] [address]   MRSA PCR Screening     Status: None   Collection Time: 10/13/19 11:03 PM   Specimen: Nasal Mucosa; Nasopharyngeal  Result Value Ref Range Status   MRSA by PCR NEGATIVE NEGATIVE Final    Comment:        The GeneXpert MRSA Assay (FDA approved for NASAL specimens only), is one component of a comprehensive MRSA colonization surveillance program. It is not intended to diagnose MRSA infection nor to guide or monitor treatment for MRSA infections. Performed at [facility], [place] [address] [place], [place] [address]   SARS CORONAVIRUS 2 (TAT 6-24 HRS) Nasopharyngeal Nasopharyngeal Swab     Status: None   Collection Time: 10/16/19  4:03 PM   Specimen: Nasopharyngeal Swab  Result Value Ref Range Status   SARS Coronavirus 2 NEGATIVE NEGATIVE Final    Comment: (NOTE) SARS-CoV-2 target nucleic acids are NOT DETECTED. The  SARS-CoV-2 RNA is generally detectable in upper and lower respiratory specimens during the acute phase of infection. Negative results do not preclude SARS-CoV-2 infection, do not rule out co-infections with other pathogens, and should not be used as the sole basis for treatment or other patient management decisions. Negative results must be combined with clinical observations, patient history, and epidemiological information. The expected result is Negative. Fact Sheet for Patients: SugarRoll.be Fact Sheet for Healthcare Providers: https://www.woods-[place].com/ This test is not yet approved or cleared by the [place] FDA and  has been authorized for detection and/or diagnosis of SARS-CoV-2 by FDA under an Emergency Use Authorization (EUA). This EUA will remain  in effect (meaning this test can be used) for the duration of the COVID-19 declaration under Section 56 4(b)(1) of the Act, 21 U.S.C. section 360bbb-3(b)(1), unless the authorization is terminated or revoked sooner. Performed at [facility], [place] [address], [place], [place] [address]      Time coordinating discharge:  39 minutes  SIGNED:   Benjamine Mola  Orlie Pollen, MD  [facility] 10/17/2019, 10:40 AM Pager   If 7PM-7AM, please contact night-coverage [email or web address] Password [email or web address]

## 2019-10-17 NOTE — TOC Transition Note (Signed)
Transition of Care [facility]) - CM/SW Discharge Note   Patient Details  Name: Matthew Zimmerman MRN: [id number] Date of Birth: 1946-07-12  [facility] CM/SW Contact:  Lynnell Catalan, RN Phone Number: 10/17/2019, 10:54 AM   Clinical Narrative:     This CM was notified by both MD and pt wife that family wants to take pt home now and not SNF. This CM explained that pt was not moving well in hospital and wife would need maximum assistance at home. Wife states that she has both her son and grandson to help. Wife request 3in1. [employer] Liaison alerted of DME need. This CM saw notes from previous stay that pt was set up with [facility] for Home Health services. This CM contacted [facility] liaison who confirms that pt is active with them. Referral given for resumption of services.  Final next level of care: [place] Barriers to Discharge: No Barriers Identified                 DME Arranged: 3-N-1 DME Agency: [facility] Date DME Agency Contacted: 10/17/19 Time DME Agency Contacted: [phone] Representative spoke with at DME Agency: Thedore Mins HH Arranged: PT, OT, Nurse's Aide, Social Work [employer] Agency: [place] Date [place]: 10/17/19 Time [place]: 1054 Representative spoke with at [place]: [place] ([place]) Interventions     Readmission Risk Interventions Readmission Risk Prevention Plan 10/16/2019  Transportation Screening Complete  PCP or Specialist Appt within 3-5 Days Complete  HRI or [place] Complete  Social Work Consult for [place] Planning/Counseling [place] Not Applicable  Medication Review Press photographer) Complete  Some recent data might be hidden

## 2019-10-17 NOTE — Progress Notes (Signed)

## 2019-10-18 LAB — CULTURE, GROUP A STREP (THRC)

## 2019-10-18 LAB — GLUCOSE, CAPILLARY
Glucose-Capillary: 246 mg/dL — ABNORMAL HIGH (ref 70–99)
Glucose-Capillary: 259 mg/dL — ABNORMAL HIGH (ref 70–99)

## 2019-10-18 LAB — CULTURE, BLOOD (ROUTINE X 2)
Culture: NO GROWTH
Culture: NO GROWTH

## 2019-10-20 ENCOUNTER — Other Ambulatory Visit: Payer: Self-pay | Admitting: *Deleted

## 2019-10-20 ENCOUNTER — Encounter: Payer: Self-pay | Admitting: *Deleted

## 2019-10-20 NOTE — Patient Outreach (Addendum)
[facility]) Care Management Chronic Special Needs Program  10/20/2019  Name: Matthew Zimmerman DOB: 05-18-1946  MRN: [id number]  [name] is enrolled in a chronic special needs plan for Diabetes. Reviewed and updated care plan. Matthew Richardsonis a 74 y.o.malewith PMH of HLD, recent placement of  IVC filter, DM, GERD, and recent admission for acute blood loss anemia due to lower GI bleed likely secondary to Xarelto. Patient arrived by private vehicle driven by his son from client's primary care provider's office after low O2 levels were found at his primary care provider's office on 10/13/19. He presented to the [facility]  emergency department with fatigue, increased weakness, no bowel movements and encephalopathy. Admitting diagnoses were: severe sepsis, acute kidney injury, metabolic acidosis, and acute metabolic encephalopathy. His hospital stay was from 10/13/19- 10/17/19. This is the third hospital admission for Matthew Zimmerman since 09/18/19. The previous  hospitalizations were related to acute lower GI bleeds.    Subjective: Client answered his cell phone and then he requested this RNCM speak to his wife Matthew Zimmerman to complete transition of care call. Matthew Zimmerman states client is unable to ambulate so he uses a urinal and has a bedside commode for bowel movements although she says he has not had a bowel movement since his hospital discharge despite daily doses of stool softener. She says [place] called her today and will start services this week. She verifies client has all needed DME except for wheelchair which she will ask home health physical therapist to order when they see him.  Discharge instructions were reviewed in detail with wife.   Goals Addressed            This Visit's Progress     Patient Stated   . "do some kind of exercise that doesn't involve standing on my feet" (pt-stated)       Home Health physical therapy was ordered  upon client's discharge from the hospital on 10/05/19 and again on 10/17/19; to be provided by [facility] home health agency    . "Keep my A1C under 7.0%" (pt-stated)       Reviewed results of most recent Hgb A1C on 09/19/19 of 7.5% while client was hospitalized Hgb A1C was not checked when client was readmitted from 1/26-/10/05/19 from another lower GI bleed and during hospitalization of 2/8-2/12 for severe sepsis    . Remain free of Covid 19 infection in 2021 (pt-stated)   On track    09/22/19 Client states he has not contracted Covid and per his request discussed the results of his Covid test done 09/18/19 which was negative. He says he will get the Covid vaccine when it is available. Advised him to listen to the news regarding area vaccine clinics and that this RNCM will notify him of any vaccination opportunities and when appointments are available.  09/24/19 With client's permission, placed him on Covid Vaccine waiting list. 10/06/19 Per  hospital discharge summary, there is no indication that client has Covid and Covid test done 09/30/19 was negative 10/16/18 Per hospital discharge summary, Covid test done 10/16/19 was negative      Other   . Client understands the importance of follow-up with providers by attending scheduled visits   On track    Client states he is consistent in keeping provider appointments Review of completed appointments with primary care provider in [place] ( [employer]- point of care tool) supports this statement    . Client will increase activity  tolerance  within next 3 months       Assess ambulatory status of client and presence of DME. Ensured home health services of PT/OT/CMA/SW were ordered upon discharge from hospital and that the agency has contacted client to arrange services.    . General - Client will not be readmitted within 30 days (C-SNP)   Not on track    10/20/19 Client was readmitted for sepsis from 2/8-2/12/21. (He was previously hospitalized from  1/14-1/17/21 and 1/26-1/31/21 for lower GI bleed.)  Since client has been hospitalized 3 times in last month, this RNCM completed a transition of care call on 10/20/19. Please see transition of care flow sheet for details.  Reinforced with client's wife the importance of a follow up visit with the primary care provider within the next week and to have a CBC and BMP labs drawn at that visit.  Client will also be assigned [place] Discharge calls Client instructed to follow discharge instructions including taking medications as prescribed,call provider for problems, call 24 hour Nurse Advice Line as needed at [phone] Mailed Emmi educational article on "[facility]"  to client's home address    . HEMOGLOBIN A1C < 7.0       10/20/19 Reviewed self management actions with client's wife, assessed CBG readings since discharge from hospital, assessed medication taking behavior, assessed frequency of hypo/hyperglycemia episode. Reviewed CHO controlled, heart healthy diet with wife, mailed Emmi educational article on heart healthy diet to client's home address    . Maintain timely refills of diabetic medication as prescribed within the year .   On track    Client states he refills his diabetic medications as prescribed and review of medication dispense record supports client's statement 10/20/19 Per wife, client has all medications and is taking them as prescribed per hospital discharge summary; review of medication dispense record supports wife's statement    . Visit Primary Care Provider or Endocrinologist at least 2 times per year        Client saw primary care provider on 10/13/19 and was sent to the hospital from the office due to low oxygen saturations, weakness, and encephalopathy     Assessment: Wife voiced good understanding of hospital discharge instructions. .   Plan: Send successful outreach letter with a copy of their individualized care plan to client Send individual  care plan to provider  Send educational material to client on: "Heart Healthy Diet and "[facility]"  Client assigned Emmi general discharge calls upon discharge from hospital Chronic care management coordinator will outreach in:  1 month  Kelli Churn RN, CCM, [place] Management Coordinator [place] Management [phone]        .

## 2019-10-22 ENCOUNTER — Ambulatory Visit: Payer: HMO | Admitting: *Deleted

## 2019-10-23 ENCOUNTER — Other Ambulatory Visit: Payer: Self-pay | Admitting: Pharmacist

## 2019-10-25 NOTE — Patient Outreach (Signed)
[facility]) Care Management  [place]   10/25/2019  Rea Parchem 1946/01/20 [id number]  Reason for referral: Medication Reconciliation Post Discharge  Referral source: [employer] C-SNP Care Manager with [facility] Current insurance: [employer] C-SNP  Outreach:  Successful telephone call with Patient's wife [name]).  HIPAA identifiers verified.   Subjective:  Patient is a 74 year old male with multiple medical conditions including but not limited to:  Type 2 diabetes, hypertension, GERD, history of GI bleed, history of Pulmonary embolism, and chronic back pain. Patient was admitted for sepsis, acute kidney injury, and metabolic encephalopathy.  He was also previously admitted for GI bleed secondary to Xarelto.  Objective: The ASCVD Risk score Mikey Bussing DC Jr., et al., 2013) failed to calculate for the following reasons:   Cannot find a previous HDL lab   Cannot find a previous total cholesterol lab  Lab Results  Component Value Date   CREATININE 1.27 (H) 10/16/2019   CREATININE 1.71 (H) 10/15/2019   CREATININE 2.64 (H) 10/14/2019    Lab Results  Component Value Date   HGBA1C 7.5 (H) 09/19/2019    Lipid Panel  No results found for: CHOL, TRIG, HDL, CHOLHDL, VLDL, LDLCALC, LDLDIRECT  BP Readings from Last 3 Encounters:  10/17/19 112/67  10/05/19 134/73  09/21/19 120/71    Allergies  Allergen Reactions  . Cortisone Other (See Comments)    CBG thrown out of control    Medications Reviewed Today    Reviewed by Elayne Guerin, [facility] (Pharmacist) on 10/23/19 at 1506  Med List Status: <None>  Medication Order Taking? Sig Documenting Provider Last Dose Status Informant  acetaminophen (TYLENOL) 325 MG tablet [id number] Yes Take 2 tablets (650 mg total) by mouth 3 (three) times daily. Georgette Shell, MD Taking Active   apixaban [facility]) 5 MG TABS tablet [id number] Yes Take 1 tablet (5 mg total) by mouth 2 (two) times daily.  Georgette Shell, MD Taking Active            Med Note Katrine Coho Oct 20, 2019 11:50 AM) Dosage change effective 10/21/18  aspirin EC 81 MG tablet [id number] Yes Take 81 mg by mouth every evening.  [provider] Taking Active Self  atorvastatin (LIPITOR) 20 MG tablet [id number] Yes Take 10 mg by mouth every evening.  [provider] Taking Active Self  augmented betamethasone dipropionate (DIPROLENE-AF) 0.05 % cream [id number] Yes Apply 1 application topically daily as needed (itchy skin near bottom).  [provider] Taking Active Self  BD INSULIN SYRINGE U/F 31G X 5/16" 0.5 ML MISC [id number] Yes 2 (two) times daily. [provider] Taking Active Self  clotrimazole-betamethasone (LOTRISONE) cream [id number] Yes Apply 1 application topically 2 (two) times daily as needed (itching).  [provider] Taking Active Self  dapagliflozin propanediol (FARXIGA) 5 MG TABS tablet [id number] Yes Take 5 mg by mouth daily. Jani Gravel, MD Taking Active Self  diclofenac sodium (VOLTAREN) 1 % GEL [id number] Yes Apply 1 application topically 4 (four) times daily as needed (pain). [provider] Taking Active Self  glucose blood (ONE TOUCH ULTRA TEST) test strip [id number] Yes Use to test blood sugar twice a day [provider] Taking Active Self  insulin NPH-regular Human (NOVOLIN 70/30) (70-30) 100 UNIT/ML injection [id number] Yes Inject 25-35 Units into the skin 2 (two) times daily with a meal. Humulin [provider] Taking Active Self  Med Note Broadus John, Duwayne Heck Sep 22, 2019 11:41 AM) States he uses a sliding scale provided by Dr Maudie Mercury  Lancets (ONETOUCH DELICA PLUS 123XX123) [place] [id number] Yes Use to test blood sugar twice daily [provider] Taking Active Self  metoprolol succinate (TOPROL-XL) 25 MG 24 hr tablet [id number]  Take 1 tablet (25 mg total) by mouth daily. Take with or immediately following a meal.  Take at night.  Patient taking differently: Take 25 mg by mouth daily. Take with or immediately following a meal.   Miquel Dunn, NP  Expired 09/30/19 2359 Self           Med Note Katrine Coho Oct 20, 2019 11:51 AM) Wife states client is taking as prescribed upon hospital discharge on 10/17/19  senna (SENOKOT) 8.6 MG TABS tablet [id number] Yes Take 2 tablets (17.2 mg total) by mouth at bedtime. Georgette Shell, MD Taking Active   vitamin B-12 (CYANOCOBALAMIN) 100 MCG tablet [id number] Yes Take 100 mcg by mouth daily. [provider] Taking Active Self  Vitamin D, Ergocalciferol, (DRISDOL) 1.25 MG (50000 UNIT) CAPS capsule [id number] Yes Take 50,000 Units by mouth every 7 (seven) days. [provider] Taking Active Self          ASSESSMENT: Date Discharged from Hospital: 10/17/2019  Date Medication Reconciliation Performed: 10/25/2019 Patient was recently discharged from hospital and all medications have been reviewed.  Medication Changes from discharge:  START taking: acetaminophen (Tylenol) apixaban (ELIQUIS) senna (SENOKOT)  CHANGE how you take: metoprolol succinate (TOPROL-XL)  STOP taking: metFORMIN 500 MG tablet  Patient's wife reviewed the patient's medications over the phone. She confirmed the Eliquis dose and that the patient discontinued Metformin.  Medication Assistance Findings:  No medication assistance needs identified  Extra Help:  Already receiving Full Extra Help Low Income Subsidy  Plan: . Will follow-up in 2 weeks.Elayne Guerin, PharmD, [place] Clinical Pharmacist [phone]

## 2019-10-28 ENCOUNTER — Emergency Department (HOSPITAL_COMMUNITY): Payer: HMO

## 2019-10-28 ENCOUNTER — Encounter (HOSPITAL_COMMUNITY): Payer: Self-pay

## 2019-10-28 ENCOUNTER — Emergency Department (HOSPITAL_COMMUNITY)
Admission: EM | Admit: 2019-10-28 | Discharge: 2019-10-28 | Disposition: A | Discharge status: Home / Self Care | Payer: HMO | Attending: Emergency Medicine | Admitting: Emergency Medicine

## 2019-10-28 ENCOUNTER — Other Ambulatory Visit: Payer: Self-pay

## 2019-10-28 DIAGNOSIS — K5900 Constipation, unspecified: Secondary | ICD-10-CM | POA: Insufficient documentation

## 2019-10-28 DIAGNOSIS — Z79899 Other long term (current) drug therapy: Secondary | ICD-10-CM | POA: Insufficient documentation

## 2019-10-28 DIAGNOSIS — Z7982 Long term (current) use of aspirin: Secondary | ICD-10-CM | POA: Diagnosis not present

## 2019-10-28 DIAGNOSIS — R109 Unspecified abdominal pain: Secondary | ICD-10-CM | POA: Diagnosis not present

## 2019-10-28 DIAGNOSIS — R600 Localized edema: Secondary | ICD-10-CM | POA: Diagnosis not present

## 2019-10-28 DIAGNOSIS — Z87891 Personal history of nicotine dependence: Secondary | ICD-10-CM | POA: Diagnosis not present

## 2019-10-28 DIAGNOSIS — R69 Illness, unspecified: Secondary | ICD-10-CM

## 2019-10-28 DIAGNOSIS — R339 Retention of urine, unspecified: Secondary | ICD-10-CM | POA: Insufficient documentation

## 2019-10-28 DIAGNOSIS — R000 Tachycardia, unspecified: Secondary | ICD-10-CM | POA: Diagnosis not present

## 2019-10-28 DIAGNOSIS — J9811 Atelectasis: Secondary | ICD-10-CM | POA: Diagnosis not present

## 2019-10-28 DIAGNOSIS — K649 Unspecified hemorrhoids: Secondary | ICD-10-CM | POA: Insufficient documentation

## 2019-10-28 DIAGNOSIS — N281 Cyst of kidney, acquired: Secondary | ICD-10-CM | POA: Diagnosis not present

## 2019-10-28 DIAGNOSIS — K644 Residual hemorrhoidal skin tags: Secondary | ICD-10-CM

## 2019-10-28 DIAGNOSIS — R609 Edema, unspecified: Secondary | ICD-10-CM | POA: Insufficient documentation

## 2019-10-28 LAB — URINALYSIS, ROUTINE W REFLEX MICROSCOPIC
Bacteria, UA: NONE SEEN
Bilirubin Urine: NEGATIVE
Glucose, UA: >=500 mg/dL — AB
Ketones, ur: NEGATIVE mg/dL
Leukocytes,Ua: NEGATIVE
Nitrite: NEGATIVE
Protein, ur: NEGATIVE mg/dL
Specific Gravity, Urine: 1.025 (ref 1.005–1.030)
pH: 5 (ref 5.0–8.0)

## 2019-10-28 LAB — COMPREHENSIVE METABOLIC PANEL
ALT: 26 U/L (ref 0–44)
AST: 28 U/L (ref 15–41)
Albumin: 3.7 g/dL (ref 3.5–5.0)
Alkaline Phosphatase: 79 U/L (ref 38–126)
Anion gap: 15 (ref 5–15)
BUN: 15 mg/dL (ref 8–23)
CO2: 16 mmol/L — ABNORMAL LOW (ref 22–32)
Calcium: 9.4 mg/dL (ref 8.9–10.3)
Chloride: 108 mmol/L (ref 98–111)
Creatinine, Ser: 1.15 mg/dL (ref 0.61–1.24)
GFR calc Af Amer: >60 mL/min (ref >60)
GFR calc non Af Amer: >60 mL/min (ref >60)
Glucose, Bld: 272 mg/dL — ABNORMAL HIGH (ref 70–99)
Potassium: 4.3 mmol/L (ref 3.5–5.1)
Sodium: 139 mmol/L (ref 135–145)
Total Bilirubin: 1 mg/dL (ref 0.3–1.2)
Total Protein: 7.4 g/dL (ref 6.5–8.1)

## 2019-10-28 LAB — CBC WITH DIFFERENTIAL/PLATELET
Abs Immature Granulocytes: 0.06 10*3/uL (ref 0.00–0.07)
Basophils Absolute: 0.1 10*3/uL (ref 0.0–0.1)
Basophils Relative: 1 %
Eosinophils Absolute: 0 10*3/uL (ref 0.0–0.5)
Eosinophils Relative: 0 %
HCT: 36.6 % — ABNORMAL LOW (ref 39.0–52.0)
Hemoglobin: 11 g/dL — ABNORMAL LOW (ref 13.0–17.0)
Immature Granulocytes: 1 %
Lymphocytes Relative: 10 %
Lymphs Abs: 1.2 10*3/uL (ref 0.7–4.0)
MCH: 27.8 pg (ref 26.0–34.0)
MCHC: 30.1 g/dL (ref 30.0–36.0)
MCV: 92.7 fL (ref 80.0–100.0)
Monocytes Absolute: 0.7 10*3/uL (ref 0.1–1.0)
Monocytes Relative: 6 %
Neutro Abs: 9.7 10*3/uL — ABNORMAL HIGH (ref 1.7–7.7)
Neutrophils Relative %: 82 %
Platelets: 441 10*3/uL — ABNORMAL HIGH (ref 150–400)
RBC: 3.95 MIL/uL — ABNORMAL LOW (ref 4.22–5.81)
RDW: 17 % — ABNORMAL HIGH (ref 11.5–15.5)
WBC: 11.7 10*3/uL — ABNORMAL HIGH (ref 4.0–10.5)
nRBC: 0 % (ref 0.0–0.2)

## 2019-10-28 LAB — CBG MONITORING, ED: Glucose-Capillary: 263 mg/dL — ABNORMAL HIGH (ref 70–99)

## 2019-10-28 LAB — LIPASE, BLOOD: Lipase: 42 U/L (ref 11–51)

## 2019-10-28 LAB — TROPONIN I (HIGH SENSITIVITY): Troponin I (High Sensitivity): 3 ng/L (ref <18)

## 2019-10-28 MED ORDER — METOPROLOL SUCCINATE ER 25 MG PO TB24
25.0000 mg | ORAL_TABLET | Freq: Every day | ORAL | Status: DC
  Administered 2019-10-28: 25 mg via ORAL
  Filled 2019-10-28: qty 1

## 2019-10-28 MED ORDER — ONDANSETRON HCL 4 MG/2ML IJ SOLN
4.0000 mg | Freq: Once | INTRAMUSCULAR | Status: AC
  Administered 2019-10-28: 4 mg via INTRAVENOUS
  Filled 2019-10-28: qty 2

## 2019-10-28 MED ORDER — SODIUM CHLORIDE (PF) 0.9 % IJ SOLN
INTRAMUSCULAR | Status: AC
  Filled 2019-10-28: qty 50

## 2019-10-28 MED ORDER — IOHEXOL 300 MG/ML  SOLN
100.0000 mL | Freq: Once | INTRAMUSCULAR | Status: AC | PRN
  Administered 2019-10-28: 100 mL via INTRAVENOUS

## 2019-10-28 MED ORDER — MORPHINE SULFATE (PF) 4 MG/ML IV SOLN
4.0000 mg | Freq: Once | INTRAVENOUS | Status: AC
  Administered 2019-10-28: 4 mg via INTRAVENOUS
  Filled 2019-10-28: qty 1

## 2019-10-28 MED ORDER — FENTANYL CITRATE (PF) 100 MCG/2ML IJ SOLN
50.0000 ug | Freq: Once | INTRAMUSCULAR | Status: AC
  Administered 2019-10-28: 50 ug via INTRAVENOUS
  Filled 2019-10-28: qty 2

## 2019-10-28 MED ORDER — APIXABAN 5 MG PO TABS
5.0000 mg | ORAL_TABLET | Freq: Once | ORAL | Status: AC
  Administered 2019-10-28: 5 mg via ORAL
  Filled 2019-10-28: qty 1

## 2019-10-28 MED ORDER — LIDOCAINE HCL URETHRAL/MUCOSAL 2 % EX GEL
1.0000 "application " | Freq: Once | CUTANEOUS | Status: AC
  Administered 2019-10-28: 1 via TOPICAL
  Filled 2019-10-28: qty 30

## 2019-10-28 MED ORDER — SODIUM CHLORIDE 0.9 % IV BOLUS (SEPSIS)
500.0000 mL | Freq: Once | INTRAVENOUS | Status: AC
  Administered 2019-10-28: 500 mL via INTRAVENOUS

## 2019-10-28 MED ORDER — MORPHINE SULFATE (PF) 4 MG/ML IV SOLN: 4.0000 mg | Freq: Once | INTRAVENOUS | Status: DC

## 2019-10-28 MED ORDER — HYDROMORPHONE HCL 1 MG/ML IJ SOLN
1.0000 mg | Freq: Once | INTRAMUSCULAR | Status: AC
  Administered 2019-10-28: 1 mg via INTRAVENOUS
  Filled 2019-10-28: qty 1

## 2019-10-28 MED ORDER — INSULIN ASPART 100 UNIT/ML ~~LOC~~ SOLN
6.0000 [IU] | Freq: Once | SUBCUTANEOUS | Status: AC
  Administered 2019-10-28: 6 [IU] via SUBCUTANEOUS
  Filled 2019-10-28: qty 0.06

## 2019-10-28 MED ORDER — LIDOCAINE HCL 2 % EX GEL: 1.0000 "application " | CUTANEOUS | 0 refills | Status: DC | PRN

## 2019-10-28 NOTE — ED Notes (Signed)
Assisted MD and PA at bedside with disimpaction. Pt tolerated well. Large amount of  Brown solid stool removed.

## 2019-10-28 NOTE — ED Notes (Signed)
Matthew Zimmerman, wife would like an update on her husband, [phone].

## 2019-10-28 NOTE — ED Provider Notes (Signed)
Fecal disimpaction  Date/Time: 10/28/2019 6:29 AM Performed by: Margarita Mail, PA-C Authorized by: Margarita Mail, PA-C  Consent: Verbal consent obtained. Risks and benefits: risks, benefits and alternatives were discussed Consent given by: patient Patient understanding: patient states understanding of the procedure being performed Patient identity confirmed: verbally with patient Preparation: Patient was prepped and draped in the usual sterile fashion. Local anesthesia used: yes Anesthesia method: lidocaine jelly.  Anesthesia: Local anesthesia used: yes  Sedation: Patient sedated: no  Patient tolerance: patient tolerated the procedure well with no immediate complications       Margarita Mail, PA-C 10/28/19 0630    Ward, Delice Bison, DO 10/28/19 [id number]

## 2019-10-28 NOTE — ED Triage Notes (Signed)
Pt brought in by ems.Per ems patient has constipation. Pt tried to do a fleets enema at home but couldn't due to hemorrhoids.

## 2019-10-28 NOTE — ED Provider Notes (Signed)
TIME SEEN: 4:20 AM  CHIEF COMPLAINT: Constipation, shortness of breath  HPI: Patient is a 74 year old male with history of hyperlipidemia, diabetes, PE on Eliquis who presents to the emergency department with complaints of constipation.  Reports he has not had a bowel movement in 3 days.  He is not sure if he is passing gas.  No nausea or vomiting.  Urinating normally.  Tried Fleet enema at home without any relief.  States he is having rectal pain due to hemorrhoids.  Also reports he is feeling short of breath.  He denies any fevers, cough, chest pain or chest discomfort.  He is unable to tell me if this is secondary to pain.  On review of records, it appears patient was admitted to the hospital 09/18/2019-09/21/2019 for rectal bleed.  Was found to have 2 AVMs that required APC and clips.  Was given 2 units packed red blood cells.  Readmitted to the hospital 09/30/2019-10/05/2019 for repeat GI bleed requiring 6 units of packed red blood cells.  At that time his Xarelto that he was taking for PE and DVT was stopped and an IVC filter was placed on 10/03/2019.  Readmitted on 10/13/2019-10/17/2019 for sepsis, AKI and acute thrombus below the IVC.  Patient was restarted on Eliquis.  Reports he has been compliant with this medication.  He has only missed his dose last night.  ROS: See HPI, limited as patient is a very poor historian Constitutional: no fever  Eyes: no drainage  ENT: no runny nose   Cardiovascular:  no chest pain  Resp:  + SOB  GI: no vomiting GU: no dysuria Integumentary: no rash  Allergy: no hives  Musculoskeletal: no leg swelling  Neurological: no slurred speech ROS otherwise negative  PAST MEDICAL HISTORY/PAST SURGICAL HISTORY:  Past Medical History:  Diagnosis Date  . Carpal tunnel syndrome on both sides   . Chest pain   . Diabetes mellitus without complication ([place])   . GERD (gastroesophageal reflux disease)   . Headache   . Hypercholesteremia   . Hyperlipidemia   . PE  (pulmonary embolism)   . Shortness of breath dyspnea    PE     MEDICATIONS:  Prior to Admission medications   Medication Sig Start Date End Date Taking? Authorizing Provider  acetaminophen (TYLENOL) 325 MG tablet Take 2 tablets (650 mg total) by mouth 3 (three) times daily. 10/17/19 10/16/20  Georgette Shell, MD  apixaban (ELIQUIS) 5 MG TABS tablet Take 1 tablet (5 mg total) by mouth 2 (two) times daily. 10/22/19   Georgette Shell, MD  aspirin EC 81 MG tablet Take 81 mg by mouth every evening.     [provider]  atorvastatin (LIPITOR) 20 MG tablet Take 10 mg by mouth every evening.     [provider]  augmented betamethasone dipropionate (DIPROLENE-AF) 0.05 % cream Apply 1 application topically daily as needed (itchy skin near bottom).  10/28/18   [provider]  BD INSULIN SYRINGE U/F 31G X 5/16" 0.5 ML MISC 2 (two) times daily. 07/30/19   [provider]  clotrimazole-betamethasone (LOTRISONE) cream Apply 1 application topically 2 (two) times daily as needed (itching).  07/11/19   [provider]  dapagliflozin propanediol (FARXIGA) 5 MG TABS tablet Take 5 mg by mouth daily.    Jani Gravel, MD  diclofenac sodium (VOLTAREN) 1 % GEL Apply 1 application topically 4 (four) times daily as needed (pain).    [provider]  glucose blood (ONE TOUCH ULTRA  TEST) test strip Use to test blood sugar twice a day 12/12/18   [provider]  insulin NPH-regular Human (NOVOLIN 70/30) (70-30) 100 UNIT/ML injection Inject 25-35 Units into the skin 2 (two) times daily with a meal. Humulin    [provider]  Lancets (ONETOUCH DELICA PLUS 123XX123) [place] Use to test blood sugar twice daily 12/12/18   [provider]  metoprolol succinate (TOPROL-XL) 25 MG 24 hr tablet Take 1 tablet (25 mg total) by mouth daily. Take with or immediately following a meal. Take at night. Patient taking differently: Take 25 mg by mouth daily. Take  with or immediately following a meal. 06/17/19 09/30/19  Miquel Dunn, NP  senna (SENOKOT) 8.6 MG TABS tablet Take 2 tablets (17.2 mg total) by mouth at bedtime. 10/17/19   Georgette Shell, MD  vitamin B-12 (CYANOCOBALAMIN) 100 MCG tablet Take 100 mcg by mouth daily.    [provider]  Vitamin D, Ergocalciferol, (DRISDOL) 1.25 MG (50000 UNIT) CAPS capsule Take 50,000 Units by mouth every 7 (seven) days.    [provider]    ALLERGIES:  Allergies  Allergen Reactions  . Cortisone Other (See Comments)    CBG thrown out of control    SOCIAL HISTORY:  Social History   Tobacco Use  . Smoking status: Former Smoker    Packs/day: 0.50    Types: Cigarettes    Quit date: 1964    Years since quitting: 57.1  . Smokeless tobacco: Former Systems developer  . Tobacco comment: smoking since 74 years old  Substance Use Topics  . Alcohol use: No    FAMILY HISTORY: Family History  Problem Relation Age of Onset  . Cancer Mother   . Diabetes Father     EXAM: BP 130/66 (BP Location: Right Arm)   Pulse 69   Temp 98.5 F (36.9 C) (Oral)   Resp 16   Ht 5\' 8"  (1.727 m)   Wt 86 kg   SpO2 100%   BMI 28.83 kg/m  CONSTITUTIONAL: Alert and oriented and responds appropriately to questions.  Elderly, hard of hearing, appears uncomfortable, moaning in pain HEAD: Normocephalic EYES: Conjunctivae clear, pupils appear equal, EOM appear intact ENT: normal nose; moist mucous membranes NECK: Supple, normal ROM CARD: RRR; S1 and S2 appreciated; no murmurs, no clicks, no rubs, no gallops RESP: Patient is tachypneic, breath sounds clear and equal bilaterally; no wheezes, no rhonchi, no rales, no hypoxia or respiratory distress, speaking full sentences ABD/GI: Normal bowel sounds; non-distended; soft, tender to palpation diffusely, no rebound, no guarding, no peritoneal signs, no hepatosplenomegaly GU: Patient has swelling of the foreskin and scrotum without redness, warmth or  tenderness RECTAL: Normal rectal tone, patient has 2 external nonthrombosed and nonbleeding hemorrhoids on exam, large stool burden in the rectum without blood or melena, patient unable to tolerate disimpaction BACK:  The back appears normal EXT: Normal ROM in all joints; no deformity noted, patient has edema in bilateral lower extremities that is nonpitting and symmetric bilaterally; no cyanosis SKIN: Normal color for age and race; warm; no rash on exposed skin NEURO: Moves all extremities equally PSYCH: The patient's mood and manner are appropriate.   MEDICAL DECISION MAKING: Patient here with constipation.  He also reports feeling short of breath which may be secondary to his discomfort.  He is tachypneic but not hypoxic.  His lungs are clear.  Will obtain acute abdominal series.  Differential also includes bowel obstruction.  If no bowel obstruction seen, will give  rectal lidocaine jelly, pain medication and soapsuds enema.  Labs, urine also ordered.  Low suspicion for ACS, PE or dissection.  EKG shows bifascicular block and sinus tachycardia.  Patient has bilateral lower extremity edema and anasarca that he reports has been present since his last admission when he was found to have an acute thrombus below the IVC filter that was placed 10/03/19.  Reports compliance with his Eliquis.  ED PROGRESS: Patient's labs show improvement of his hemoglobin to 11.  He does have a metabolic acidosis with normal anion gap which appears to be a chronic issue for him.  Blood glucose 272 but I do not think he is in DKA.  Recently admitted for AKI but creatinine is currently 1.15.  Patient disimpacted in the ED and reports feeling much better.  Getting soapsuds enema.  Respiratory rate has improved now his pain has improved.  Abdominal x-ray shows no bowel obstruction.  Lungs clear.  Patient now tells me that he has not been able to urinate since 10 PM.  Will obtain bladder scan.  Signed out to oncoming ED  physician to follow-up on urinalysis and reassess patient after enema.  Anticipate discharge home with bowel regimen.  I reviewed all nursing notes and pertinent previous records as available.  I have interpreted any EKGs, lab and urine results, imaging (as available).    Fecal disimpaction  Date/Time: 10/28/2019 7:01 AM Performed by: Omarion Minnehan, Delice Bison, DO Authorized by: Margarita Mail, PA-C  Consent: Verbal consent obtained. Risks and benefits: risks, benefits and alternatives were discussed Consent given by: patient Patient understanding: patient states understanding of the procedure being performed Patient consent: the patient's understanding of the procedure matches consent given Procedure consent: procedure consent matches procedure scheduled Relevant documents: relevant documents present and verified Test results: test results available and properly labeled Imaging studies: imaging studies available Required items: required blood products, implants, devices, and special equipment available Patient identity confirmed: verbally with patient Time out: Immediately prior to procedure a "time out" was called to verify the correct patient, procedure, equipment, support staff and site/side marked as required. Local anesthesia used: yes Anesthesia method: lidocaine jelly.  Anesthesia: Local anesthesia used: yes  Sedation: Patient sedated: no  Patient tolerance: patient tolerated the procedure well with no immediate complications         Date: 10/28/2019 6:07 AM  Rate: 110  Rhythm: Sinus tachycardia  QRS Axis: normal  Intervals: Right bundle branch block and left anterior fascicular block  ST/T Wave abnormalities: normal  Conduction Disutrbances: none  Narrative Interpretation: Sinus tachycardia, bifascicular block, artifact, PVCs     Leshawn Kolesnik was evaluated in Emergency Department on 10/28/2019 for the symptoms described in the history of present illness. He was  evaluated in the context of the global COVID-19 pandemic, which necessitated consideration that the patient might be at risk for infection with the SARS-CoV-2 virus that causes COVID-19. Institutional protocols and algorithms that pertain to the evaluation of patients at risk for COVID-19 are in a state of rapid change based on information released by regulatory bodies including the CDC and federal and state organizations. These policies and algorithms were followed during the patient's care in the ED.  Patient was seen wearing N95, face shield, gloves.    Shawni Volkov, Delice Bison, DO 10/28/19 [phone]

## 2019-10-28 NOTE — ED Notes (Signed)
Pts cath changed to leg bag. Pt educated on use, Pt provided with standard drainage bag for night use. Pt called family for transport. Pt assisted from bedside and assisted with clean up. .Pt assisted in [place]. Pt await family in room. Pts call bell within reach

## 2019-10-28 NOTE — ED Notes (Signed)
Pt still tolerating enema and does not feel need to evacuate. Pt is in no acute distress.

## 2019-10-28 NOTE — ED Notes (Addendum)
Attempted to bladder scan pt. Pt unable to lie on back. Bladder scan only revealed 72ml with pt partially on side MD aware

## 2019-10-28 NOTE — Discharge Instructions (Addendum)
Recommend taking MiraLAX twice daily and Colace 100 mg twice daily to help with bowel movements.  These medications are found over-the-counter.  They do not require prescription.  You may hold these medications if you begin having diarrhea.  Recommend increasing your water and fiber intake at home.  If you are unable to eat foods that are high in fiber you may use over-the-counter Benefiber or Metamucil.  You may use lidocaine jelly which has been sent to your pharmacy to help with rectal pain from your external hemorrhoids before having bowel movements.  For your lower extremity swelling, I recommend compression hose and keeping your legs elevated when at rest.  Please limit your salt intake.  Please continue your Eliquis as prescribed.  Need to continue working closely with your doctor and vascular doctors.  The name of the vascular specialist is in your discharge instructions.  Make these calls today to set an appointment as soon as possible.  DO NOT take Metformin (Glucophage) for at least 48 hours after the CT scan that you had done in the emergency department.  The safest way to resume this medication, is to stay hydrated for the next 2 days and then see your doctor Thursday or Friday for repeat blood tests to check your kidney function and blood sugar before resuming Metformin (Glucophage).  Continue to use your prescribed insulin as prescribed.  Continue to follow your diabetic diet.  Continue your Eliquis as prescribed.  You were given 1 dose in the emergency department on 2\23/21 in the afternoon.  You may take your evening dose late in the evening before midnight.  Leave your Foley catheter in place.  Change the leg bag as instructed.  Follow-up with your doctor for recheck and advice on when it is appropriate to discontinue the Foley catheter.

## 2019-10-28 NOTE — Care Plan (Signed)
Patient is identified as high risk for 12 month mortality and has had 3 inpatient admissions in the last month. Please consider palliative consultation on this visit for goals of care, management of symptoms of chronic illness and home based support. Order may be place in [place].A palliative provider will see him in the ED, on admission or referral will be made to community based palliative care if discharged from ED.  Lane Hacker, DO Palliative Medicine

## 2019-10-28 NOTE — ED Provider Notes (Signed)
Patient is getting enema.  Will need bladder scan.  Anticipate discharge with MiraLAX and Colace twice daily.  Patient continues to be in significant lower abdominal pain.  He has not had additional bowel movement since initial disimpaction by Dr. Leonides Schanz.  Patient reportedly is too uncomfortable to lie on his back to get a accurate bladder scan.  Nursing was able to do a bladder scan with the patient in a lateral decubitus position showing 30 mL.  Patient's lower abdomen continues to be tender to palpation.  Patient has significant edema of the scrotum and penile shaft but penis does not appear to have any paraphimosis.  Patient does have bilateral lower extremity edema but he denies that the legs are painful.  Due to significant persisting abdominal pain despite bowel movement, will proceed with CT abdomen. Physical Exam  BP 135/70   Pulse 98   Temp 98.5 F (36.9 C) (Oral)   Resp 18   Ht 5\' 8"  (1.727 m)   Wt 86 kg   SpO2 100%   BMI 28.83 kg/m   Physical Exam Constitutional:      Comments: Mental status is clear.  Patient does not have respiratory distress.  He appears very uncomfortable.  He is lying on right lateral side.  Pulmonary:     Effort: Pulmonary effort is normal.  Abdominal:     Comments: Abdomen is not significantly distended.  Patient endorses tenderness diffusely over the lower abdomen.  He endorses tenderness over the suprapubic area.  Large edema of the scrotum.  Musculoskeletal:     Comments: 2+ edema bilateral lower extremities.  Neurological:     General: No focal deficit present.     ED Course/Procedures     Procedures  MDM   CT scan showed significant urinary retention.  Foley catheter was placed.  Patient's pain was relieved.  That point he is able to lie flat and is now comfortable.  He has had disimpaction and bowel movement.  All discomfort is resolved in the abdomen.  I had extensive conversation with the patient's wife regarding the known clot in the IVC.   This is redemonstrated on CT scan.  Patient is to continue his Eliquis as prescribed for this condition.  He is counseled on continuing elevating his extremities as much as possible.  They are counseled on following up both with her PCP and vascular surgery.  Patient's hemoglobin is better than at last discharge.  No signs of active bleeding at this time.  Renal function is improved.  At this time I do not see indication for admission.  Acute problems have been resolved and instructions are given for follow-up plan and return precautions.      Charlesetta Shanks, MD 10/28/19 1419

## 2019-10-28 NOTE — ED Notes (Signed)
Provided pt with a [place] sandwich and water.  Pt said he may not eat it with the way he is feeling.

## 2019-10-28 NOTE — ED Notes (Signed)
Performed soap suds enema. Pt tolerated full enema. Pt still holding. Told patient to call when he needs to get up to bedside commode. Pt tolerated procedure well and states "I feel so much better" Patient no longer tachypnic and seems to be feeling better.

## 2019-10-28 NOTE — ED Notes (Signed)
Went through d/c papers with pt. Pt would prefer to go home with a regular bag, not a leg bag.

## 2019-10-28 NOTE — ED Notes (Signed)
Marisa Hua., son, would like an update on his father, [phone].

## 2019-10-28 NOTE — ED Notes (Signed)
CT tech to nurses station stating that the pt is refusing Ct at this time stating "I cant lie on my back" MD aware, verbal order received for 4mg  IV morphine.

## 2019-10-28 NOTE — ED Notes (Signed)
Joell Prohaska, wife would like an update on her husband, [phone].

## 2019-10-31 ENCOUNTER — Other Ambulatory Visit: Payer: Self-pay

## 2019-10-31 NOTE — Patient Outreach (Signed)
  [facility]) Care Management Chronic Special Needs Program    10/31/2019  Name: Matthew Zimmerman, DOB: 06-02-1946  MRN: [id number]   Mr. Zolton Chalifour is enrolled in a chronic special needs plan for Diabetes. Telephone call to client's primary MD office to discuss recent ED visit on 10/28/19.  Message left for nurse, Roxan Hockey requesting return call.   PLAN:  RNCM will attempt 2nd telephone call to clients primary care provider within 1 week.   Quinn Plowman RN,BSN,CCM [place] [facility] [phone]

## 2019-11-02 ENCOUNTER — Ambulatory Visit: Payer: HMO | Attending: Internal Medicine

## 2019-11-02 DIAGNOSIS — Z23 Encounter for immunization: Secondary | ICD-10-CM | POA: Insufficient documentation

## 2019-11-02 NOTE — Progress Notes (Signed)
   Covid-19 Vaccination Clinic  Name:  Matthew Zimmerman    MRN: [id number] DOB: Jan 23, 1946  11/02/2019  [name] was observed post Covid-19 immunization for 15 minutes without incidence. He was provided with Vaccine Information Sheet and instruction to access the V-Safe system.   [name] was instructed to call 911 with any severe reactions post vaccine: [name] Difficulty breathing  . Swelling of your face and throat  . A fast heartbeat  . A bad rash all over your body  . Dizziness and weakness    Immunizations Administered    Name Date Dose VIS Date Route   Pfizer COVID-19 Vaccine 11/02/2019  4:23 PM 0.3 mL 08/15/2019 Intramuscular   Manufacturer: [place]   Lot: [id number]   [place]: [id number]

## 2019-11-03 ENCOUNTER — Other Ambulatory Visit: Payer: Self-pay

## 2019-11-03 ENCOUNTER — Ambulatory Visit: Payer: Self-pay | Admitting: *Deleted

## 2019-11-03 NOTE — Patient Outreach (Signed)
  [facility] [facility]) Care Management Chronic Special Needs Program    11/03/2019  Name: Dermaine Chabra, DOB: [birth year]  MRN: [id number]   Mr. Tobin Rogalski is enrolled in a chronic special needs plan for Diabetes. Telephone call to client's primary care provider, Dr. Jeneen Rinks Kim's office.  Spoke with Denny Peon who stated she would have to take a message for Dr.Kim's nurse.  RNCM left message requesting return call.   PLAN; RNCM will await return call from Dr. Julianne Rice office. If no return call will attempt 3rd telephone outreach within 1 week.   Quinn Plowman RN,BSN,CCM [place] [facility] [phone]

## 2019-11-04 ENCOUNTER — Other Ambulatory Visit: Payer: Self-pay

## 2019-11-04 DIAGNOSIS — R339 Retention of urine, unspecified: Secondary | ICD-10-CM | POA: Diagnosis not present

## 2019-11-04 DIAGNOSIS — I824Z3 Acute embolism and thrombosis of unspecified deep veins of distal lower extremity, bilateral: Secondary | ICD-10-CM | POA: Diagnosis not present

## 2019-11-04 DIAGNOSIS — Z978 Presence of other specified devices: Secondary | ICD-10-CM | POA: Diagnosis not present

## 2019-11-04 NOTE — Patient Outreach (Addendum)
  [place] [facility]) Care Management Chronic Special Needs Program    11/04/2019  Name: Matthew Zimmerman, DOB: 09-Apr-1946  MRN: [id number]   [name] is enrolled in a chronic special needs plan for Diabetes. Telephone call to client's primary MD office. Spoke with Renee who states she will take message and forward to primary MD's nurse. RNCM requested return call.  RNCM sent in basket message to Dr. Maudie Mercury regarding clients recent ED visit for constipation.   PLAN; RNCM will attempt call within 2 business days.   Quinn Plowman RN,BSN,CCM [place] [facility] [phone]

## 2019-11-06 ENCOUNTER — Other Ambulatory Visit: Payer: Self-pay

## 2019-11-06 NOTE — Patient Outreach (Signed)
  [facility]) Care Management Chronic Special Needs Program    11/06/2019  Name: Matthew Zimmerman, DOB: [birth year]  MRN: [id number]   [name] is enrolled in a chronic special needs plan for Diabetes. Returned call made to clients primary MD office. Spoke with nurse Danae Chen who states clients primary MD will be out of the office for the next few weeks. Discussed with Danae Chen client's recent ED visit on 10/28/19 for constipation and urinary retention.  Danae Chen states client was seen by the nurse practitioner, Lonnie on 11/04/19 and a urology referral was made. RNCM asked if nurse practitioner thinks client would benefit from a stronger medication due to clients recent issue with constipation. Danae Chen states she will discuss this with the nurse practitioner and return call to [facility].   PLAN: RNCM will await follow up from [facility] with primary MD office.  RNCM will follow up with client at next scheduled outreach  Quinn Plowman RN,BSN,CCM [place] [facility] [phone]

## 2019-11-07 ENCOUNTER — Other Ambulatory Visit: Payer: Self-pay

## 2019-11-07 NOTE — Patient Outreach (Signed)
[place] [facility]) Care Management Chronic Special Needs Program  11/07/2019  Name: Matthew Zimmerman DOB: 1946-01-28  MRN: [id number]  [name] is enrolled in a chronic special needs plan for Diabetes. Reviewed and updated care plan.  Subjective: Telephone call to client post ED visit. Spoke with client's wife Matthew Zimmerman.  HIPAA verified by wife, Matthew Zimmerman. Wife states client is doing better and walking better. She states [facility] home [facility] physical therapy and nursing is still workiing with client. She states she is getting stronger with physical therapy. Wife reports client continues to use a walker for ambulation. Wife reports client's bowel function is doing better. RNCM informed wife client's primary care provider office was contacted regarding client's bowel issue and possible need for a stronger medication. Currently awaiting feedback from provider office. Wife voiced her appreciation for [facility] requesting a stronger medication and stated, " he needs that."  Wife states client has been referred by his doctor to a urologist due to his urine retention.  Wife states client was recently in the emergency department due to constipation and difficulty with urination. Wife states client was sent home with a catheter that has since been removed. She reports client has been urinating on his own and the swelling in his private area has gone down .     Wife states client's fasting blood sugar this as was 130.  She reports last night's blood sugar reading was 113. Wife states she had client only take 1/2 of his evening lantas because she was concerned his blood sugar may drop during the night. RNCM advised wife / patient to discuss her concerns with client's provider regarding his potential to run low at night and how she should manage his evening insulin. Wife verbalized understanding.  Wife states clients legs have been swollen since having the blood clot. She reports the  swelling has gone down but not completely. Wife states she discussed this with the provider at client's 11/04/19 office visit. She states provider advised her that it takes some time for the swelling to go down after having clots.  Wife states client had COVID vaccine on 11/02/19 and is scheduled for his second vaccine on 12/02/19.  Goals Addressed            This Visit's Progress   . "do some kind of exercise that doesn't involve standing on my feet" (pt-stated)   On track    [place] physical therapy was ordered upon client's discharge from the hospital on 10/05/19 and again on 10/17/19; to be provided by [facility] home [facility] agency Continue ongoing follow up with [facility] home [facility] as recommended     . "Keep my A1C under 7.0%" (pt-stated)   Not on track    Reviewed results of most recent Hgb A1C on 09/19/19 of 7.5%  Discussed diabetes self management actions:  Glucose monitoring per provider recommendation  Visit provider every 3-6 months as directed  Hbg A1C level every 3-6 months.  Carbohydrate controlled meal planning  Taking diabetes medication as prescribed by provider  Physical activity      . Client understands the importance of follow-up with providers by attending scheduled visits   On track    Client states he is consistent in keeping provider appointments Review of completed appointments with primary care provider in [place] ( Knowledge Performance Now- point of care tool) supports this statement Most recent follow up visit with primary care provider 11/04/19    . Client will increase activity tolerance  within next 3 months   On track    Client receiving ongoing physical therapy and nursing follow up with [facility] home [facility].  Continue to follow provider and home health plan of care as recommended.     . Client will verbalize knowledge of self management of Hypertension as evidences by BP reading of 140/90 or less; or as defined by provider   On track    Client  states  he does not monitor his blood pressure at home but says he does takes his medicine as directed.  Review of his blood pressure readings in [facility] (Knowledge Performance Now- point of care tool) over the last year, demonstrate his readings are meeting target Review of chart during hospitalization from 1/26-1/31/21 demonstrates the majority of client's blood pressure readings were meeting target of <140/<90    . General - Client will not be readmitted within 30 days (C-SNP) (pt-stated)       Client was readmitted for sepsis from 2/8-2/12/21. (He was previously hospitalized from 1/14-1/17/21 and 1/26-1/31/21 for lower GI bleed.)  Reinforced with wife importance of follow up visits with provider as recommended.  Report concerns/ symptoms to provider as soon as possible.  Continue to take your medication as prescribed.     [name] HEMOGLOBIN A1C < 7.0       Discussed diabetes self management actions: with client's wife.   Continue Glucose monitoring per provider recommendation  Visit provider every 3-6 months as directed  Hbg A1C level every 3-6 months.  Continue to adhere to a Carbohydrate controlled meal plan  Continue to take your diabetes medication as prescribed by provider ( contact provider for questions/ concerns)  Continue to be Physical activity as tolerated    . Maintain timely refills of diabetic medication as prescribed within the year .   On track    Per wife client is taking his medications as prescribed.  Wife reports client is not taking metformin as advised. She reports he continues to take his insulins.  Continue to maintain timely refills of your medications Contact your RN case manager [phone]) if you are unable to obtain your medications Call your doctor for questions/ concerns.     . Remain free of Covid 19 infection in 2021 (pt-stated)   On track    Wife reports client had COVID vaccine 11/02/19. Reports 2nd covid vaccine scheduled for 12/02/19. Follow up for second  COVID vaccination as scheduled Continue  COVID precautions:  wear mask, social distance, and wash hands thoroughly      ASSESSMENT;   Client with history of chronic anticoagulation for pulmonary embolism and right lower extremity DV.  Status post IVC filter placement 10/03/19.  Client previously admitted on 09/18/19 and 09/30/19 for acute GI Bleed and admitted on 10/13/19 for sepsis.   Client is not meeting diabetes self-management goal of hemoglobin A1C of <7.0% with most recent reading of 7.5% on 09/19/19. [name] Client has good understanding of:  COVID-19 cause, symptoms, precautions (social distancing, stay at home order, hand washing, wear face covering when unable to maintain or ensure 6 foot social distancing), and symptoms requiring provider notification.  Plan:  Send successful outreach letter with a copy of their individualized care plan and Send individual care plan to provider  Chronic care management coordinator will outreach in:  3 Months      [place] Nursing/RN Coord [facility] Case Manager, C-SNP   .

## 2019-11-11 ENCOUNTER — Ambulatory Visit: Payer: HMO

## 2019-11-12 ENCOUNTER — Other Ambulatory Visit: Payer: Self-pay

## 2019-11-12 DIAGNOSIS — M6281 Muscle weakness (generalized): Secondary | ICD-10-CM | POA: Diagnosis not present

## 2019-11-12 DIAGNOSIS — E119 Type 2 diabetes mellitus without complications: Secondary | ICD-10-CM | POA: Diagnosis not present

## 2019-11-12 DIAGNOSIS — I82422 Acute embolism and thrombosis of left iliac vein: Secondary | ICD-10-CM | POA: Diagnosis not present

## 2019-11-12 DIAGNOSIS — I82421 Acute embolism and thrombosis of right iliac vein: Secondary | ICD-10-CM | POA: Diagnosis not present

## 2019-11-12 NOTE — Patient Outreach (Addendum)
  [place] [facility]) Care Management Chronic Special Needs Program   11/12/2019  Name: Alexandria Sanocki, DOB: [birth year]  MRN: [id number]  The client was discussed in today's interdisciplinary care team meeting.  The following issues were discussed:  Client's needs, Changes in health status, Key risk triggers/risk stratification, Care Plan, Coordination of care, Care transitions and Issues/barriers to care  Participants present:   Thea Silversmith, MSN, RN, CCM   Melissa Sandlin RN,BSN,CCM, CDE  Quinn Plowman RN, BSN, CCM  Maryella Shivers, MD  Bary Castilla, RN, BSN, MS, CCM Jacqlyn Larsen, BSN, CCM Karrie Meres, Pharm D (HTA) Linus Orn, Government social research officer, CSNP (HTA)Joann [place], [place], LDN (HTA)  Recommendations:  Follow up with client's assigned home health nurse regarding severity of client's left leg swelling and pain.  RNCM called [facility] home [facility] and spoke with [place]. [place] stated clients home health nurse is Maudie Mercury.  RNCM requested return call from [facility] for update on client.   Plan:  RNCM will follow up with client at next scheduled outreach.  RNCM will notify [facility] clinical director, Tobie Poet of client status update from home health nurse.   Quinn Plowman RN,BSN,CCM [place] [facility] [phone]

## 2019-11-12 NOTE — Patient Outreach (Signed)
  [place] [facility]) Care Management Chronic Special Needs Program    11/12/2019  Name: Matthew Zimmerman, DOB: 09-13-1945  MRN: [id number]   [name] is enrolled in a chronic special needs plan for Diabetes. Return call received from Matthew Zimmerman, home health nurse with [facility]. Matthew Zimmerman states she has not heard back from clients primary care provider office after her call on yesterday to report his symptoms. Kim states per her assessment client is experiencing swelling in both of his legs. She describes clients legs as being "real tight but not pitting." She reports that clients left leg was swollen previously and he did not complain of pain in that leg. She states the swelling in clients right lower leg and now in his right thigh is new. Matthew Zimmerman states she will reach out to client's primary care provider office on tomorrow 11/13/19.  Update on client's status provided to Matthew Zimmerman, Interior and spatial designer and Health team advantage medical director, Matthew Zimmerman.   Matthew Plowman RN,BSN,CCM [place] [facility] [phone]

## 2019-11-13 ENCOUNTER — Other Ambulatory Visit: Payer: Self-pay

## 2019-11-13 NOTE — Patient Outreach (Signed)
  [place] [facility]) Care Management Chronic Special Needs Program    11/13/2019  Name: Matthew Zimmerman, DOB: 1945-12-20  MRN: [id number]   [name] is enrolled in a chronic special needs plan for Diabetes. Received call from Maudie Mercury with [facility] home [facility]. Maudie Mercury states she called client's primary care provider office again today regarding client's leg symptoms and left a message to receive a return call from [place], [place]   [facility] sent message to Dr. Maryella Shivers requesting a peer to peer review with primary care provider.  Quinn Plowman RN,BSN,CCM [place] [facility] [phone]

## 2019-11-17 ENCOUNTER — Other Ambulatory Visit: Payer: Self-pay | Admitting: Pharmacist

## 2019-11-17 DIAGNOSIS — I82421 Acute embolism and thrombosis of right iliac vein: Secondary | ICD-10-CM | POA: Diagnosis not present

## 2019-11-17 DIAGNOSIS — M6281 Muscle weakness (generalized): Secondary | ICD-10-CM | POA: Diagnosis not present

## 2019-11-17 DIAGNOSIS — I82422 Acute embolism and thrombosis of left iliac vein: Secondary | ICD-10-CM | POA: Diagnosis not present

## 2019-11-17 DIAGNOSIS — E119 Type 2 diabetes mellitus without complications: Secondary | ICD-10-CM | POA: Diagnosis not present

## 2019-11-17 NOTE — Patient Outreach (Signed)
Matthew Zimmerman 11/17/2019  Matthew Zimmerman 1945-11-04 [id number]  Reason for call: Follow up.  Spoke with patient and his wife via speaker phone. HIPAA identifiers were obtained.  Patient is a 74 year old male with multiple medical conditions including but not limited to:  type 2 diabetes, hypertension, hyperlipidemia,PE status post IVC filter, GERD, recent hospital admissions for acute blood loss anemia secondary to GI bleed and ED visits for weakness &shortness of breath as well as constipation.  Patient reported feeling "better" today.  His wife said his legs were still swollen but that they looked better than they did last week. She wondered if he should be taking potassium. She said she thought she remember his provider saying that his diuretic may cause his potassium level to decrease.  On 10/28/2019, the patient's K was 4.3. It had been as high as 5.2 in January. The patient's chart was researched and there was no mention of starting potassium documented.  Patient's wife was advised to call the patient's provider to be sure.  Patient reported leg pain 6/10 in both legs today. He has been taking acetaminophen to manage his pain. He said his pain has decreased.  HgA1c 7.5% on statin (pravastatin last filled  No medication issues today.  Medications Reviewed Today    Reviewed by Elayne Guerin, [facility] (Pharmacist) on 11/17/19 at 1539  Med List Status: <None>  Medication Order Taking? Sig Documenting Provider Last Dose Status Informant  acetaminophen (TYLENOL) 325 MG tablet [id number] Yes Take 2 tablets (650 mg total) by mouth 3 (three) times daily.  Patient taking differently: Take 1,300 mg by mouth 3 (three) times daily. arthritis   Georgette Shell, MD Taking Active Self  apixaban (ELIQUIS) 5 MG TABS tablet [id number] Yes Take 1 tablet (5 mg total) by mouth 2 (two) times daily. Georgette Shell, MD Taking Active Self           Med Note  Katrine Coho Oct 20, 2019 11:50 AM) Dosage change effective 10/21/18  aspirin EC 81 MG tablet [id number] Yes Take 81 mg by mouth every evening.  [provider] Taking Active Self  atorvastatin (LIPITOR) 20 MG tablet [id number] Yes Take 10 mg by mouth every evening.  [provider] Taking Active Self  augmented betamethasone dipropionate (DIPROLENE-AF) 0.05 % cream [id number] Yes Apply 1 application topically daily as needed (itchy skin near bottom).  [provider] Taking Active Self  BD INSULIN SYRINGE U/F 31G X 5/16" 0.5 ML MISC [id number] Yes 2 (two) times daily. [provider] Taking Active Self  clotrimazole-betamethasone (LOTRISONE) cream [id number] Yes Apply 1 application topically 2 (two) times daily as needed (itching).  [provider] Taking Active Self  dapagliflozin propanediol (FARXIGA) 5 MG TABS tablet [id number] Yes Take 5 mg by mouth daily. Jani Gravel, MD Taking Active Self  diclofenac sodium (VOLTAREN) 1 % GEL [id number] Yes Apply 1 application topically 4 (four) times daily as needed (pain). [provider] Taking Active Self  glucose blood (ONE TOUCH ULTRA TEST) test strip [id number] Yes Use to test blood sugar twice a day [provider] Taking Active Self  insulin NPH-regular Human (NOVOLIN 70/30) (70-30) 100 UNIT/ML injection [id number] Yes Inject 25-35 Units into the skin 2 (two) times daily with a meal. Humulin [provider] Taking Active Self           Med Note Gentry Roch   Tue Oct 28, 2019 11:14 AM) 35  units in the AM  Lancets (ONETOUCH DELICA PLUS 123XX123) [place] [id number] Yes Use to test blood sugar twice daily [provider] Taking Active Self  lidocaine (XYLOCAINE) 2 % jelly [id number] Yes Apply 1 application topically as needed. Ward, Delice Bison, DO Taking Active   metFORMIN (GLUCOPHAGE) 500 MG tablet [id number] Yes Take 500 mg by mouth 2 (two) times daily with a meal. [provider] Taking Active Self  metoprolol succinate (TOPROL-XL) 25 MG 24 hr tablet [id number]  Take 1 tablet (25 mg total) by mouth daily. Take with or immediately following a meal. Take at night.  Patient taking differently: Take 25 mg by mouth daily. Take with or immediately following a meal.   Miquel Dunn, NP  Expired 10/28/19 2359 Self           Med Note Katrine Coho Oct 20, 2019 11:51 AM) Wife states client is taking as prescribed upon hospital discharge on 10/17/19  senna (SENOKOT) 8.6 MG TABS tablet [id number] Yes Take 2 tablets (17.2 mg total) by mouth at bedtime. Georgette Shell, MD Taking Active Self  vitamin B-12 (CYANOCOBALAMIN) 500 MCG tablet [id number] Yes Take 500 mcg by mouth daily.  [provider] Taking Active Self  Vitamin D, Ergocalciferol, (DRISDOL) 1.25 MG (50000 UNIT) CAPS capsule [id number] Yes Take 50,000 Units by mouth every 7 (seven) days. [provider] Taking Active Self         Plan: Close patient's case as he did not have any other medication issues today. Will gladly reopen upon need/request.  Elayne Guerin, PharmD, [place] Clinical Pharmacist [phone]

## 2019-11-18 ENCOUNTER — Ambulatory Visit: Payer: Self-pay | Admitting: Pharmacist

## 2019-11-19 DIAGNOSIS — E118 Type 2 diabetes mellitus with unspecified complications: Secondary | ICD-10-CM | POA: Diagnosis not present

## 2019-11-19 DIAGNOSIS — B3789 Other sites of candidiasis: Secondary | ICD-10-CM | POA: Diagnosis not present

## 2019-11-19 DIAGNOSIS — M79604 Pain in right leg: Secondary | ICD-10-CM | POA: Diagnosis not present

## 2019-11-19 DIAGNOSIS — Z79899 Other long term (current) drug therapy: Secondary | ICD-10-CM | POA: Diagnosis not present

## 2019-11-19 DIAGNOSIS — R609 Edema, unspecified: Secondary | ICD-10-CM | POA: Diagnosis not present

## 2019-11-20 ENCOUNTER — Other Ambulatory Visit: Payer: Self-pay

## 2019-11-20 NOTE — Patient Outreach (Signed)
  [facility]) Care Management Chronic Special Needs Program    11/20/2019  Name: Matthew Zimmerman, DOB: 1946-01-04  MRN: [id number]   Matthew Zimmerman is enrolled in a chronic special needs plan for Diabetes. Update received from Dr. Maryella Shivers, client was seen at primary provider office on 11/19/19 and stat labs were ordered.   PLAN; RNCM will follow up with client at next scheduled outreach and/ or as needs arise.   Quinn Plowman RN,BSN,CCM [place] [facility] [phone]

## 2019-12-01 DIAGNOSIS — N401 Enlarged prostate with lower urinary tract symptoms: Secondary | ICD-10-CM | POA: Diagnosis not present

## 2019-12-01 DIAGNOSIS — N281 Cyst of kidney, acquired: Secondary | ICD-10-CM | POA: Diagnosis not present

## 2019-12-01 DIAGNOSIS — R338 Other retention of urine: Secondary | ICD-10-CM | POA: Diagnosis not present

## 2019-12-01 DIAGNOSIS — Z125 Encounter for screening for malignant neoplasm of prostate: Secondary | ICD-10-CM | POA: Diagnosis not present

## 2019-12-02 ENCOUNTER — Ambulatory Visit: Payer: HMO | Attending: Internal Medicine

## 2019-12-02 DIAGNOSIS — Z23 Encounter for immunization: Secondary | ICD-10-CM

## 2019-12-02 NOTE — Progress Notes (Signed)
   Covid-19 Vaccination Clinic  Name:  Matthew Zimmerman    MRN: [id number] DOB: [birth year]  12/02/2019  [name] was observed post Covid-19 immunization for 15 minutes without incident. He was provided with Vaccine Information Sheet and instruction to access the V-Safe system.   [name] was instructed to call 911 with any severe reactions post vaccine: [name] Difficulty breathing  . Swelling of face and throat  . A fast heartbeat  . A bad rash all over body  . Dizziness and weakness   Immunizations Administered    Name Date Dose VIS Date Route   Pfizer COVID-19 Vaccine 12/02/2019  4:36 PM 0.3 mL 08/15/2019 Intramuscular   Manufacturer: [employer], [employer]   Lot: [id number]   [place]: [id number]

## 2019-12-04 DIAGNOSIS — I82413 Acute embolism and thrombosis of femoral vein, bilateral: Secondary | ICD-10-CM | POA: Diagnosis not present

## 2019-12-04 DIAGNOSIS — M79604 Pain in right leg: Secondary | ICD-10-CM | POA: Diagnosis not present

## 2019-12-08 DIAGNOSIS — E118 Type 2 diabetes mellitus with unspecified complications: Secondary | ICD-10-CM | POA: Diagnosis not present

## 2019-12-08 DIAGNOSIS — E119 Type 2 diabetes mellitus without complications: Secondary | ICD-10-CM | POA: Diagnosis not present

## 2019-12-08 DIAGNOSIS — I82421 Acute embolism and thrombosis of right iliac vein: Secondary | ICD-10-CM | POA: Diagnosis not present

## 2019-12-08 DIAGNOSIS — I10 Essential (primary) hypertension: Secondary | ICD-10-CM | POA: Diagnosis not present

## 2019-12-08 DIAGNOSIS — I82422 Acute embolism and thrombosis of left iliac vein: Secondary | ICD-10-CM | POA: Diagnosis not present

## 2019-12-08 DIAGNOSIS — M6281 Muscle weakness (generalized): Secondary | ICD-10-CM | POA: Diagnosis not present

## 2019-12-12 DIAGNOSIS — E1140 Type 2 diabetes mellitus with diabetic neuropathy, unspecified: Secondary | ICD-10-CM | POA: Diagnosis not present

## 2019-12-12 DIAGNOSIS — E785 Hyperlipidemia, unspecified: Secondary | ICD-10-CM | POA: Diagnosis not present

## 2019-12-12 DIAGNOSIS — Z86711 Personal history of pulmonary embolism: Secondary | ICD-10-CM | POA: Diagnosis not present

## 2019-12-12 DIAGNOSIS — E118 Type 2 diabetes mellitus with unspecified complications: Secondary | ICD-10-CM | POA: Diagnosis not present

## 2019-12-12 DIAGNOSIS — R609 Edema, unspecified: Secondary | ICD-10-CM | POA: Diagnosis not present

## 2019-12-12 DIAGNOSIS — Z7901 Long term (current) use of anticoagulants: Secondary | ICD-10-CM | POA: Diagnosis not present

## 2019-12-12 DIAGNOSIS — N189 Chronic kidney disease, unspecified: Secondary | ICD-10-CM | POA: Diagnosis not present

## 2019-12-12 DIAGNOSIS — I2510 Atherosclerotic heart disease of native coronary artery without angina pectoris: Secondary | ICD-10-CM | POA: Diagnosis not present

## 2019-12-12 DIAGNOSIS — E11319 Type 2 diabetes mellitus with unspecified diabetic retinopathy without macular edema: Secondary | ICD-10-CM | POA: Diagnosis not present

## 2019-12-12 DIAGNOSIS — D649 Anemia, unspecified: Secondary | ICD-10-CM | POA: Diagnosis not present

## 2019-12-12 DIAGNOSIS — K5901 Slow transit constipation: Secondary | ICD-10-CM | POA: Diagnosis not present

## 2019-12-12 DIAGNOSIS — I10 Essential (primary) hypertension: Secondary | ICD-10-CM | POA: Diagnosis not present

## 2019-12-23 ENCOUNTER — Encounter (INDEPENDENT_AMBULATORY_CARE_PROVIDER_SITE_OTHER): Payer: Self-pay | Admitting: Ophthalmology

## 2019-12-23 ENCOUNTER — Ambulatory Visit (INDEPENDENT_AMBULATORY_CARE_PROVIDER_SITE_OTHER): Payer: HMO | Admitting: Ophthalmology

## 2019-12-23 ENCOUNTER — Other Ambulatory Visit: Payer: Self-pay

## 2019-12-23 DIAGNOSIS — Z961 Presence of intraocular lens: Secondary | ICD-10-CM | POA: Diagnosis not present

## 2019-12-23 DIAGNOSIS — E113412 Type 2 diabetes mellitus with severe nonproliferative diabetic retinopathy with macular edema, left eye: Secondary | ICD-10-CM | POA: Insufficient documentation

## 2019-12-23 DIAGNOSIS — E113411 Type 2 diabetes mellitus with severe nonproliferative diabetic retinopathy with macular edema, right eye: Secondary | ICD-10-CM | POA: Insufficient documentation

## 2019-12-23 NOTE — Assessment & Plan Note (Signed)
The nature of diabetic macular edema was discussed with the patient. Treatment options were outlined including medical therapy, laser & vitrectomy. The use of injectable medications reviewed, including Avastin, Lucentis, and Eylea. Periodic injections into the eye are likely to resolve diabetic macular edema (swelling in the center of vision). Initially, injections are delivered are delivered every 4-6 weeks, and the interval extended as the condition improves. On average, 8-9 injections the first year, and 5 in year 2. Improvement in the condition most often improves on medical therapy. Occasional use of focal laser is also recommended for residual macular edema (swelling). Excellent control of blood glucose and blood pressure are encouraged under the care of a primary physician or endocrinologist. Similarly, attempts to maintain serum cholesterol, low density lipoproteins, and high-density lipoproteins in a favorable range were recommended.

## 2019-12-23 NOTE — Progress Notes (Signed)
12/23/2019     CHIEF COMPLAINT Patient presents for Diabetic Eye Exam   HISTORY OF PRESENT ILLNESS: Matthew Zimmerman is a 74 y.o. male who presents to the clinic today for:   HPI    Diabetic Eye Exam    Vision is stable.  Associated Symptoms Negative for Flashes, Floaters and Distortion.  Diabetes characteristics include Type 2.  Blood sugar level is controlled.  Last Blood Glucose 71.  Last A1C 7.1.  I, the attending physician,  performed the HPI with the patient and updated documentation appropriately.          Comments    4 Month Diabetic Exam OU. OCT  Pt states vision is stable. Denies FOL and floaters. Pt states in the AM OS feels like there is something in it, but it clears up during the day       Last edited by Tilda Franco on 12/23/2019 10:09 AM. (History)      Referring physician: Jani Gravel, MD [place],  [place] [id number]  HISTORICAL INFORMATION:   Selected notes from the [facility]    Lab Results  Component Value Date   HGBA1C 7.5 (H) 09/19/2019     CURRENT MEDICATIONS: No current outpatient medications on file. (Ophthalmic Drugs)   No current facility-administered medications for this visit. (Ophthalmic Drugs)   Current Outpatient Medications (Other)  Medication Sig  . acetaminophen (TYLENOL) 325 MG tablet Take 2 tablets (650 mg total) by mouth 3 (three) times daily. (Patient taking differently: Take 1,300 mg by mouth 3 (three) times daily. arthritis)  . apixaban (ELIQUIS) 5 MG TABS tablet Take 1 tablet (5 mg total) by mouth 2 (two) times daily.  [name] aspirin EC 81 MG tablet Take 81 mg by mouth every evening.   [name] atorvastatin (LIPITOR) 20 MG tablet Take 10 mg by mouth every evening.   [name] augmented betamethasone dipropionate (DIPROLENE-AF) 0.05 % cream Apply 1 application topically daily as needed (itchy skin near bottom).   . BD INSULIN SYRINGE U/F 31G X 5/16" 0.5 ML MISC 2 (two) times daily.  .  clotrimazole-betamethasone (LOTRISONE) cream Apply 1 application topically 2 (two) times daily as needed (itching).   . dapagliflozin propanediol (FARXIGA) 5 MG TABS tablet Take 5 mg by mouth daily.  . diclofenac sodium (VOLTAREN) 1 % GEL Apply 1 application topically 4 (four) times daily as needed (pain).  . furosemide (LASIX) 80 MG tablet Take 80 mg by mouth daily.  [name] glucose blood (ONE TOUCH ULTRA TEST) test strip Use to test blood sugar twice a day  . insulin NPH-regular Human (NOVOLIN 70/30) (70-30) 100 UNIT/ML injection Inject 25-35 Units into the skin 2 (two) times daily with a meal. Humulin  . Lancets (ONETOUCH DELICA PLUS 123XX123) MISC Use to test blood sugar twice daily  . lidocaine (XYLOCAINE) 2 % jelly Apply 1 application topically as needed.  . senna (SENOKOT) 8.6 MG TABS tablet Take 2 tablets (17.2 mg total) by mouth at bedtime.  . vitamin B-12 (CYANOCOBALAMIN) 500 MCG tablet Take 500 mcg by mouth daily.   . Vitamin D, Ergocalciferol, (DRISDOL) 1.25 MG (50000 UNIT) CAPS capsule Take 50,000 Units by mouth every 7 (seven) days.  . metFORMIN (GLUCOPHAGE) 500 MG tablet Take 500 mg by mouth 2 (two) times daily with a meal.  . metoprolol succinate (TOPROL-XL) 25 MG 24 hr tablet Take 1 tablet (25 mg total) by mouth daily. Take with or immediately following a meal. Take at night. (  Patient taking differently: Take 25 mg by mouth daily. Take with or immediately following a meal.)   No current facility-administered medications for this visit. (Other)      REVIEW OF SYSTEMS: ROS    Positive for: Endocrine   Last edited by Tilda Franco on 12/23/2019 10:09 AM. (History)       ALLERGIES Allergies  Allergen Reactions  . Cortisone Other (See Comments)    CBG thrown out of control    PAST MEDICAL HISTORY Past Medical History:  Diagnosis Date  . Carpal tunnel syndrome on both sides   . Chest pain   . Diabetes mellitus without complication ([place])   . GERD (gastroesophageal  reflux disease)   . Headache   . Hypercholesteremia   . Hyperlipidemia   . PE (pulmonary embolism)   . Shortness of breath dyspnea    PE    Past Surgical History:  Procedure Laterality Date  . BILATERAL CARPAL TUNNEL RELEASE Bilateral   . CATARACT EXTRACTION W/PHACO Left 08/05/2006   Dr. Gershon Crane  . CATARACT EXTRACTION W/PHACO Right 08/18/2006   Dr. Gershon Crane  . COLONOSCOPY    . COLONOSCOPY WITH PROPOFOL Left 09/20/2019   Procedure: COLONOSCOPY WITH PROPOFOL;  Surgeon: Ronnette Juniper, MD;  Location: [facility];  Service: Gastroenterology;  Laterality: Left;  . COLONOSCOPY WITH PROPOFOL N/A 10/03/2019   Procedure: COLONOSCOPY WITH PROPOFOL;  Surgeon: Wilford Corner, MD;  Location: [facility];  Service: Endoscopy;  Laterality: N/A;  . ELBOW SURGERY     BONE SPURS REMOVED B ELBOWS  . EYE SURGERY Bilateral    cataracts  . FOOT SURGERY     HEEL SPURS REMOVED B HEELS  . HEMOSTASIS CLIP PLACEMENT  09/20/2019   Procedure: HEMOSTASIS CLIP PLACEMENT;  Surgeon: Ronnette Juniper, MD;  Location: [facility];  Service: Gastroenterology;;  . HEMOSTASIS CLIP PLACEMENT  10/03/2019   Procedure: HEMOSTASIS CLIP PLACEMENT;  Surgeon: Wilford Corner, MD;  Location: [facility];  Service: Endoscopy;;  . HOT HEMOSTASIS N/A 09/20/2019   Procedure: HOT HEMOSTASIS (ARGON PLASMA COAGULATION/BICAP);  Surgeon: Ronnette Juniper, MD;  Location: [facility];  Service: Gastroenterology;  Laterality: N/A;  . I & D EXTREMITY Right 09/18/2014   Procedure: IRRIGATION AND DEBRIDEMENT RIGHT THUMB, open fracture repair;  Surgeon: Linna Hoff, MD;  Location: [facility];  Service: Orthopedics;  Laterality: Right;  . IR IVC FILTER PLMT / S&I Burke Keels GUID/MOD SED  10/03/2019  . LEG SURGERY Left    left thigh table saw accident, "cut to the bone", I&D/suturing  . SHOULDER SURGERY Left    Bone spur  . SPINE SURGERY  09/04/1985   lumbar spine  . SUBMUCOSAL INJECTION  10/03/2019   Procedure: SUBMUCOSAL INJECTION;  Surgeon: Wilford Corner, MD;  Location: [facility];  Service: Endoscopy;;  . TOE SURGERY      FAMILY HISTORY Family History  Problem Relation Age of Onset  . Cancer Mother   . Diabetes Father     SOCIAL HISTORY Social History   Tobacco Use  . Smoking status: Former Smoker    Packs/day: 0.50    Types: Cigarettes    Quit date: 1964    Years since quitting: 57.3  . Smokeless tobacco: Former Systems developer  . Tobacco comment: smoking since 74 years old  Substance Use Topics  . Alcohol use: No  . Drug use: No         OPHTHALMIC EXAM: Base Eye Exam    Visual Acuity (Snellen - Linear)      Right Left  Dist [place] 20/40 -2 20/60 -2   Dist ph [place] NI NI       Tonometry (Tonopen, 10:16 AM)      Right Left   Pressure 8 13       Pupils      Pupils Dark Light Shape React APD   Right PERRL 2.5 2.5 Round Minimal None   Left PERRL 2.5 2.5 Round Minimal None       Visual Fields (Counting fingers)      Left Right    Full Full       Neuro/Psych    Oriented x3: Yes   Mood/Affect: Normal       Dilation    Both eyes: 1.0% Mydriacyl, 2.5% Phenylephrine @ 10:16 AM        Slit Lamp and Fundus Exam    External Exam      Right Left   External Normal Normal       Slit Lamp Exam      Right Left   Lids/Lashes Normal Normal   Conjunctiva/Sclera White and quiet White and quiet   Cornea Clear Clear   Anterior Chamber Deep and quiet Deep and quiet   Iris Round and reactive Round and reactive   Lens Posterior chamber intraocular lens Posterior chamber intraocular lens   Anterior Vitreous Normal Normal       Fundus Exam      Right Left   Posterior Vitreous Posterior vitreous detachment Posterior vitreous detachment   C/D Ratio 0.3 0.3   Macula Macular thickening, Microaneurysms, Mild clinically significant macular edema Microaneurysms, no macular thickening   Vessels Severe NPDR Severe NPDR   Periphery Normal Normal          IMAGING AND PROCEDURES  Imaging and Procedures for  12/23/19  OCT, Retina - OU - Both Eyes       Right Eye Quality was good. Scan locations included subfoveal. Findings include abnormal foveal contour.   Left Eye Quality was good. Scan locations included subfoveal. Progression has improved. Findings include abnormal foveal contour.   Notes Clinically significant macular edema, temporally OD, will likely need focal laser photocoagulation soon  OS, focal laser photocoagulation 4 months prior with less thickening superiorly.                ASSESSMENT/PLAN:  Severe nonproliferative diabetic retinopathy of right eye, with macular edema, associated with type 2 diabetes mellitus ([place])  The nature of diabetic macular edema was discussed with the patient. Treatment options were outlined including medical therapy, laser & vitrectomy. The use of injectable medications reviewed, including Avastin, Lucentis, and Eylea. Periodic injections into the eye are likely to resolve diabetic macular edema (swelling in the center of vision). Initially, injections are delivered are delivered every 4-6 weeks, and the interval extended as the condition improves. On average, 8-9 injections the first year, and 5 in year 2. Improvement in the condition most often improves on medical therapy. Occasional use of focal laser is also recommended for residual macular edema (swelling). Excellent control of blood glucose and blood pressure are encouraged under the care of a primary physician or endocrinologist. Similarly, attempts to maintain serum cholesterol, low density lipoproteins, and high-density lipoproteins in a favorable range were recommended.       ICD-10-CM   1. Severe nonproliferative diabetic retinopathy of left eye, with macular edema, associated with type 2 diabetes mellitus (HCC)  [id number] OCT, Retina - OU - Both Eyes  2. Severe nonproliferative diabetic retinopathy of right eye,  with macular edema, associated with type 2 diabetes mellitus (HCC)   E11.3411 OCT, Retina - OU - Both Eyes  3. Pseudophakia  Z96.1     1.  2.  3.  Ophthalmic Meds Ordered this visit:  No orders of the defined types were placed in this encounter.      Return in about 3 weeks (around 01/13/2020) for OD, dilate, FOCAL, OCT.  There are no Patient Instructions on file for this visit.   Explained the diagnoses, plan, and follow up with the patient and they expressed understanding.  Patient expressed understanding of the importance of proper follow up care.   Clent Demark Chrystina Naff M.D. Diseases & Surgery of the Retina and Vitreous Retina & Diabetic [place] 12/23/19     Abbreviations: M myopia (nearsighted); A astigmatism; H hyperopia (farsighted); P presbyopia; Mrx spectacle prescription;  CTL contact lenses; OD right eye; OS left eye; OU both eyes  XT exotropia; ET esotropia; PEK punctate epithelial keratitis; PEE punctate epithelial erosions; DES dry eye syndrome; MGD meibomian gland dysfunction; ATs artificial tears; PFAT's preservative free artificial tears; Cut [place] nuclear sclerotic cataract; PSC posterior subcapsular cataract; ERM epi-retinal membrane; PVD posterior vitreous detachment; RD retinal detachment; DM diabetes mellitus; DR diabetic retinopathy; NPDR non-proliferative diabetic retinopathy; PDR proliferative diabetic retinopathy; CSME clinically significant macular edema; DME diabetic macular edema; dbh dot blot hemorrhages; CWS cotton wool spot; POAG primary open angle glaucoma; C/D cup-to-disc ratio; HVF humphrey visual field; GVF goldmann visual field; OCT optical coherence tomography; IOP intraocular pressure; BRVO Branch retinal vein occlusion; CRVO central retinal vein occlusion; CRAO central retinal artery occlusion; BRAO branch retinal artery occlusion; RT retinal tear; SB scleral buckle; PPV pars plana vitrectomy; VH Vitreous hemorrhage; PRP panretinal laser photocoagulation; IVK intravitreal kenalog; VMT vitreomacular traction; MH Macular  hole;  NVD neovascularization of the disc; NVE neovascularization elsewhere; AREDS age related eye disease study; ARMD age related macular degeneration; POAG primary open angle glaucoma; EBMD epithelial/anterior basement membrane dystrophy; ACIOL anterior chamber intraocular lens; IOL intraocular lens; PCIOL posterior chamber intraocular lens; Phaco/IOL phacoemulsification with intraocular lens placement; [place] photorefractive keratectomy; LASIK laser assisted in situ keratomileusis; HTN hypertension; DM diabetes mellitus; COPD chronic obstructive pulmonary disease

## 2019-12-26 DIAGNOSIS — N189 Chronic kidney disease, unspecified: Secondary | ICD-10-CM | POA: Diagnosis not present

## 2019-12-26 DIAGNOSIS — D649 Anemia, unspecified: Secondary | ICD-10-CM | POA: Diagnosis not present

## 2019-12-26 DIAGNOSIS — E118 Type 2 diabetes mellitus with unspecified complications: Secondary | ICD-10-CM | POA: Diagnosis not present

## 2019-12-26 DIAGNOSIS — I10 Essential (primary) hypertension: Secondary | ICD-10-CM | POA: Diagnosis not present

## 2019-12-26 DIAGNOSIS — Z79899 Other long term (current) drug therapy: Secondary | ICD-10-CM | POA: Diagnosis not present

## 2019-12-26 DIAGNOSIS — R609 Edema, unspecified: Secondary | ICD-10-CM | POA: Diagnosis not present

## 2019-12-30 ENCOUNTER — Ambulatory Visit (INDEPENDENT_AMBULATORY_CARE_PROVIDER_SITE_OTHER): Payer: HMO | Admitting: Vascular Surgery

## 2019-12-30 ENCOUNTER — Other Ambulatory Visit: Payer: Self-pay

## 2019-12-30 ENCOUNTER — Encounter: Payer: Self-pay | Admitting: Vascular Surgery

## 2019-12-30 VITALS — BP 170/82 | HR 58 | Temp 97.8°F | Resp 20 | Ht 68.0 in | Wt 204.0 lb

## 2019-12-30 DIAGNOSIS — I82403 Acute embolism and thrombosis of unspecified deep veins of lower extremity, bilateral: Secondary | ICD-10-CM | POA: Diagnosis not present

## 2019-12-30 NOTE — Progress Notes (Signed)
Vascular and Vein Specialist of [facility]  Patient name: Matthew Zimmerman MRN: [id number] DOB: [birth year] Sex: male  REASON FOR CONSULT: Evaluation DVT  HPI: Matthew Zimmerman is a 74 y.o. male, who is here today for evaluation of major DVT.  He is here with his wife.  He had suffered lower extremity DVT and pulmonary embolus.  He was on anticoagulant but had a major GI bleed and this was stopped.  He had a vena cava filter placed by interventional radiology in January 2021.  In reviewing his duplex he had clot from his iliac veins through his common femoral vein, femoral vein, popliteal and tibial veins bilaterally.  He is now back on Eliquis since his GI bleed has resolved.  He had a subsequent CT scan later in February 2021 and this shows that he does have some thrombus at the level of his filter but the filter was patent with good positioning.  He is continued to have marked swelling bilaterally and is seeing [name] today for discussion of any further options  Past Medical History:  Diagnosis Date  . Carpal tunnel syndrome on both sides   . Chest pain   . Diabetes mellitus without complication ([place])   . GERD (gastroesophageal reflux disease)   . Headache   . Hypercholesteremia   . Hyperlipidemia   . PE (pulmonary embolism)   . Shortness of breath dyspnea    PE     Family History  Problem Relation Age of Onset  . Cancer Mother   . Diabetes Father     SOCIAL HISTORY: Social History   Socioeconomic History  . Marital status: Married    Spouse name: Not on file  . Number of children: 2  . Years of education: Not on file  . Highest education level: Not on file  Occupational History  . Occupation: Retired and disabled    Employer: [employer]  Tobacco Use  . Smoking status: Former Smoker    Packs/day: 0.50    Types: Cigarettes    Quit date: 1964    Years since quitting: 57.3  . Smokeless tobacco: Former Systems developer  . Tobacco comment: smoking  since 74 years old  Substance and Sexual Activity  . Alcohol use: No  . Drug use: No  . Sexual activity: Not on file  Other Topics Concern  . Not on file  Social History Narrative   Lives with wife.[name]   He is retired from [employer], also disabled   [name] son age 74 years old died suddenly at client's home on 11/03/18   Social Determinants of Health   Financial Resource Strain: Low Risk   . Difficulty of Paying Living Expenses: Not very hard  Food Insecurity: No Food Insecurity  . Worried About Charity fundraiser in the Last Year: Never true  . Ran Out of Food in the Last Year: Never true  Transportation Needs: No Transportation Needs  . Lack of Transportation (Medical): No  . Lack of Transportation (Non-Medical): No  Physical Activity: Inactive  . Days of Exercise per Week: 0 days  . Minutes of Exercise per Session: 0 min  Stress: No Stress Concern Present  . Feeling of Stress : Only a little  Social Connections: Unknown  . Frequency of Communication with Friends and Family: More than three times a week  . Frequency of Social Gatherings with Friends and Family: Not on file  . Attends Religious Services: Not on file  . Active Member of Clubs or  Organizations: Not on file  . Attends Archivist Meetings: Not on file  . Marital Status: Married  Human resources officer Violence:   . Fear of Current or Ex-Partner:   . Emotionally Abused:   [name] Physically Abused:   . Sexually Abused:     Allergies  Allergen Reactions  . Cortisone Other (See Comments)    CBG thrown out of control    Current Outpatient Medications  Medication Sig Dispense Refill  . acetaminophen (TYLENOL) 325 MG tablet Take 2 tablets (650 mg total) by mouth 3 (three) times daily. (Patient taking differently: Take 1,300 mg by mouth 3 (three) times daily. arthritis) 100 tablet 2  . apixaban (ELIQUIS) 5 MG TABS tablet Take 1 tablet (5 mg total) by mouth 2 (two) times daily. 60 tablet 1  . aspirin EC 81 MG  tablet Take 81 mg by mouth every evening.     [name] atorvastatin (LIPITOR) 20 MG tablet Take 10 mg by mouth every evening.     [name] augmented betamethasone dipropionate (DIPROLENE-AF) 0.05 % cream Apply 1 application topically daily as needed (itchy skin near bottom).     . BD INSULIN SYRINGE U/F 31G X 5/16" 0.5 ML MISC 2 (two) times daily.    . clotrimazole-betamethasone (LOTRISONE) cream Apply 1 application topically 2 (two) times daily as needed (itching).     . dapagliflozin propanediol (FARXIGA) 5 MG TABS tablet Take 5 mg by mouth daily.    . diclofenac sodium (VOLTAREN) 1 % GEL Apply 1 application topically 4 (four) times daily as needed (pain).    . furosemide (LASIX) 80 MG tablet Take 80 mg by mouth daily.    [name] glucose blood (ONE TOUCH ULTRA TEST) test strip Use to test blood sugar twice a day    . insulin NPH-regular Human (NOVOLIN 70/30) (70-30) 100 UNIT/ML injection Inject 25-35 Units into the skin 2 (two) times daily with a meal. Humulin    . Lancets (ONETOUCH DELICA PLUS 123XX123) MISC Use to test blood sugar twice daily    . lidocaine (XYLOCAINE) 2 % jelly Apply 1 application topically as needed. 85 g 0  . metFORMIN (GLUCOPHAGE) 500 MG tablet Take 500 mg by mouth 2 (two) times daily with a meal.    . senna (SENOKOT) 8.6 MG TABS tablet Take 2 tablets (17.2 mg total) by mouth at bedtime. 120 tablet 0  . vitamin B-12 (CYANOCOBALAMIN) 500 MCG tablet Take 500 mcg by mouth daily.     . Vitamin D, Ergocalciferol, (DRISDOL) 1.25 MG (50000 UNIT) CAPS capsule Take 50,000 Units by mouth every 7 (seven) days.    . metoprolol succinate (TOPROL-XL) 25 MG 24 hr tablet Take 1 tablet (25 mg total) by mouth daily. Take with or immediately following a meal. Take at night. (Patient taking differently: Take 25 mg by mouth daily. Take with or immediately following a meal.) 90 tablet 1   No current facility-administered medications for this visit.    REVIEW OF SYSTEMS:  [X]  denotes positive finding, [ ]   denotes negative finding Cardiac  Comments:  Chest pain or chest pressure: x   Shortness of breath upon exertion: x   Short of breath when lying flat: x   Irregular heart rhythm:        Vascular    Pain in calf, thigh, or hip brought on by ambulation:    Pain in feet at night that wakes you up from your sleep:     Blood clot in your veins:  Leg swelling:  x       Pulmonary    Oxygen at home:    Productive cough:     Wheezing:         Neurologic    Sudden weakness in arms or legs:     Sudden numbness in arms or legs:     Sudden onset of difficulty speaking or slurred speech:    Temporary loss of vision in one eye:     Problems with dizziness:         Gastrointestinal    Blood in stool:     Vomited blood:         Genitourinary    Burning when urinating:     Blood in urine:        Psychiatric    Major depression:         Hematologic    Bleeding problems:    Problems with blood clotting too easily:        Skin    Rashes or ulcers:        Constitutional    Fever or chills:      PHYSICAL EXAM: Vitals:   12/30/19 0920  BP: (!) 170/82  Pulse: (!) 58  Resp: 20  Temp: 97.8 F (36.6 C)  SpO2: 97%  Weight: 204 lb (92.5 kg)  Height: 5\' 8"  (1.727 m)    GENERAL: The patient is a well-nourished male, in no acute distress. The vital signs are documented above.  Does have some shortness of breath sitting in the exam room CARDIOVASCULAR: He does have easily palpable dorsalis pedis pulses bilaterally.  Swelling bilaterally PULMONARY: There is good air exchange  ABDOMEN: Soft and non-tender  MUSCULOSKELETAL: There are no major deformities or cyanosis. NEUROLOGIC: No focal weakness or paresthesias are detected. SKIN: There are no ulcers or rashes noted.  Both lower extremities are swollen but no ulcerations PSYCHIATRIC: The patient has a normal affect.  DATA:  DVT study and CT discussed above  MEDICAL ISSUES: Had a long discussion with the patient and his wife  present.  Explained that his only option is conservative therapy regarding his extensive DVT.  I explained that he was at risk for PE while he was off anticoagulation but the filter was placed appropriately for this.  Also explained now that he is back on anticoagulation that he should have a better chance for recanalization of the collateral formation and that he should have improvement.  I instructed him on the critical importance of elevation and also knee-high compression with a sequential compression stockings.  They were relieved with this discussion but were hoping that something could be done for his chronic DVT.  I explained that there is no other therapeutic options and certainly would not recommend any attempts at lysis or mechanical removal at this point.   Rosetta Posner, MD FACS Vascular and Vein Specialists of [facility] Tel [phone] Pager [phone]

## 2020-01-02 ENCOUNTER — Other Ambulatory Visit: Payer: Self-pay

## 2020-01-02 NOTE — Patient Outreach (Signed)
  [facility]) Care Management Chronic Special Needs Program    01/02/2020  Name: Matthew Zimmerman, DOB: Jan 20, 1946  MRN: [id number]   [name] is enrolled in a chronic special needs plan for Diabetes. Telephone call to client's wife/ designated party release, Canek Kleiser for assessment update of client. Unable to reach. HIPAA compliant voice message left with call back phone number and return call request.   PLAN; RNCM will attempt 2nd telephone outreach within 1 week.   Quinn Plowman RN,BSN,CCM [facility] [phone]

## 2020-01-07 ENCOUNTER — Ambulatory Visit: Payer: Self-pay

## 2020-01-08 ENCOUNTER — Other Ambulatory Visit: Payer: Self-pay

## 2020-01-08 NOTE — Patient Outreach (Signed)
[facility]) Care Management Chronic Special Needs Program  01/08/2020  Name: Matthew Zimmerman DOB: May 27, 1946  MRN: [id number]  [name] is enrolled in a chronic special needs plan for Diabetes.  Telephone call to client for assessment follow up. Client's wife/ designated party release, Matthew Zimmerman answered call. Wife verified HIPAA for client.  Wife states client is doing a little better. She reports he continues to have leg pain more in his calves than thighs.  Wife states client does leg exercises and go outside and walks around.   She states client is following up more frequently with his primary care provider. She confirms most recent visits were 11/04/19 and 12/12/19. Wife states client has also followed up with the vascular doctor on 12/30/19 regarding his legs as well.  No new treatment plan given.  Wife reports home health services have stopped. She states client is able to do a little more for himself now. Wife states since she prepares clients food she would like more information on diabetic diet. RNCM offered to send client / caregiver diabetic diet information and offered to refer client /caregiver to the HTA health coach. Wife declined stating she would like to just have the articles.  RNCM reviewed and updated care plan.   Goals Addressed            This Visit's Progress   . COMPLETED: "do some kind of exercise that doesn't involve standing on my feet" (pt-stated)       Wife reports client does exercises with his legs and walks around outside.    [name] "Keep my A1C under 7.0%" (pt-stated)   On track    Reviewed results of most recent Hgb A1C on 09/19/19 of 7.5%  Reviewed diabetes self management actions:  Glucose monitoring per provider recommendation  Visit provider every 3-6 months as directed  Hbg A1C level every 3-6 months.  Carbohydrate controlled meal planning  Taking diabetes medication as prescribed by provider  Physical activity       . Caregiver will verbalize understanding of diabetic diet food options/ choices within 3 months.       RN case manager will send client/caregiver education articles:  Diabetes diet, Diabetic meal planning, Heart healthy diet.     . Client understands the importance of follow-up with providers by attending scheduled visits   On track    Most recent follow up with primary care provider 12/12/19 Continue to follow up with your provider as recommended.     . COMPLETED: Client will increase activity tolerance  within next 3 months       Wife reports home health therapy has stopped.  Wife reports client is doing leg exercises and walks around outside.     . Client will verbalize knowledge of self management of Hypertension as evidences by BP reading of 140/90 or less; or as defined by provider   On track    Wife reports blood pressure is not monitored at home.  RN case manager will send client education article: High blood pressure: What you can do Take your medications as prescribed.  Follow up with your doctor as recommended.  Call your doctor if you have questions.      . COMPLETED: General - Client will not be readmitted within 30 days (C-SNP) (pt-stated)       Wife reports client has not been readmitted to the hospital since previous admission 10/13/19 - 10/17/19.     [name] HEMOGLOBIN A1C < 7.0  Reviewed diabetes self management actions: with client's wife.   Continue Glucose monitoring per provider recommendation  Visit provider every 3-6 months as directed  Hbg A1C level every 3-6 months.  Continue to adhere to a Carbohydrate controlled meal plan  Continue to take your diabetes medication as prescribed by provider ( contact provider for questions/ concerns)  Continue to be Physical activity as tolerated    . Maintain timely refills of diabetic medication as prescribed within the year .   On track    Continue to maintain timely refills of your medications Contact your RN  case manager [phone]) if you are unable to obtain your medications Call your doctor for questions/ concerns.     . Obtain annual  Lipid Profile, LDL-C   On track    Lipid profile completed 12/08/19 The goal for LDL is less than 70 mg/ dl as you are at high risk for complications try to avoid saturated fats, trans-fats, and eat more fiber RN case manager will send client education article: heart healthy diet.     . Obtain Annual Eye (retinal)  Exam    On track    Diabetic eye exam completed on 07/22/19 Wife reports client is seeing a retinal specialist for treatment.  Plan to schedule your eye exam yearly    . Obtain Annual Foot Exam       Advised to discuss annual foot exam with doctor at next follow up visit. Diabetes foot care - Check feet daily at home (look for skin color changes, cuts, sores or cracks in the skin, swelling of feet or ankles, ingrown or fungal toenails, corn or calluses). Report these findings to your doctor - Wash feet with soap and water, dry feet well especially between toes - Moisturize your feet but not between the toes - Always wear shoes that protect your whole feet.      . Obtain annual screen for micro albuminuria (urine) , nephropathy (kidney problems)   On track    Urine for protein test completed on 02/21/19 Attend yearly physicals and follow up visits with your providers and complete labs as recommended.     Illa Level Hemoglobin A1C at least 2 times per year   On track    Hgb A1C test completed on 08/07/19 and 09/19/19  Continue to keep your follow up appointments with your provider and have lab work completed as recommended.     . Remain free of Covid 19 infection in 2021 (pt-stated)   On track    Wife reports client has completed his 2 series covid shot. Client has good understanding of:  COVID symptoms, precautions (social distancing,  hand washing, wear face covering when unable to maintain or ensure 6 foot social distancing), and symptoms  requiring provider notification.     . Visit Primary Care Provider or Endocrinologist at least 2 times per year    On track    Client saw primary care provider on 11/04/19 and 12/12/19 Continue to see your primary care provider as recommended.       RNCM advised client to notify MD of any changes in condition prior to scheduled appointment. Client advised to contact RNCM as needed and contact their HTA concierge for benefit questions.  RNCM provided client 24 hour HTA nurse advise line number [phone]  [facility] verified client aware of 911 services for urgent/ emergent needs.  Plan:  Send successful outreach letter with a copy of their individualized care plan, Send individual care plan to provider  and Social worker  Chronic care management coordinator will outreach in:  3 Months   Quinn Plowman RN,BSN,CCM [place] [facility] [phone]   .

## 2020-01-13 ENCOUNTER — Ambulatory Visit (INDEPENDENT_AMBULATORY_CARE_PROVIDER_SITE_OTHER): Payer: HMO | Admitting: Ophthalmology

## 2020-01-13 ENCOUNTER — Other Ambulatory Visit: Payer: Self-pay

## 2020-01-13 DIAGNOSIS — E113411 Type 2 diabetes mellitus with severe nonproliferative diabetic retinopathy with macular edema, right eye: Secondary | ICD-10-CM

## 2020-01-13 NOTE — Progress Notes (Signed)
01/13/2020     CHIEF COMPLAINT Patient presents for Retina Follow Up   HISTORY OF PRESENT ILLNESS: Matthew Zimmerman is a 74 y.o. male who presents to the clinic today for:   HPI    Retina Follow Up    Patient presents with  Diabetic Retinopathy.  In right eye.  Duration of 3 weeks.  Since onset it is stable.          Comments    3 week follow up- Possible Focal OD Patient denies change in vision and overall has no complaints. LBS 81 this AM A1C 7.03 November 2019        Last edited by Gerda Diss on 01/13/2020 10:28 AM. (History)      Referring physician: Jani Gravel, MD [place],  [place] [address]  HISTORICAL INFORMATION:   Selected notes from the MEDICAL RECORD NUMBER    Lab Results  Component Value Date   HGBA1C 7.5 (H) 09/19/2019     CURRENT MEDICATIONS: No current outpatient medications on file. (Ophthalmic Drugs)   No current facility-administered medications for this visit. (Ophthalmic Drugs)   Current Outpatient Medications (Other)  Medication Sig  . acetaminophen (TYLENOL) 325 MG tablet Take 2 tablets (650 mg total) by mouth 3 (three) times daily. (Patient taking differently: Take 1,300 mg by mouth 3 (three) times daily. arthritis)  . apixaban (ELIQUIS) 5 MG TABS tablet Take 1 tablet (5 mg total) by mouth 2 (two) times daily.  [name] aspirin EC 81 MG tablet Take 81 mg by mouth every evening.   [name] atorvastatin (LIPITOR) 20 MG tablet Take 10 mg by mouth every evening.   [name] augmented betamethasone dipropionate (DIPROLENE-AF) 0.05 % cream Apply 1 application topically daily as needed (itchy skin near bottom).   . BD INSULIN SYRINGE U/F 31G X 5/16" 0.5 ML MISC 2 (two) times daily.  . clotrimazole-betamethasone (LOTRISONE) cream Apply 1 application topically 2 (two) times daily as needed (itching).   . dapagliflozin propanediol (FARXIGA) 5 MG TABS tablet Take 5 mg by mouth daily.  . diclofenac sodium (VOLTAREN) 1 % GEL Apply 1 application  topically 4 (four) times daily as needed (pain).  . furosemide (LASIX) 80 MG tablet Take 80 mg by mouth daily.  [name] glucose blood (ONE TOUCH ULTRA TEST) test strip Use to test blood sugar twice a day  . insulin NPH-regular Human (NOVOLIN 70/30) (70-30) 100 UNIT/ML injection Inject 25-35 Units into the skin 2 (two) times daily with a meal. Humulin  . Lancets (ONETOUCH DELICA PLUS 123XX123) MISC Use to test blood sugar twice daily  . lidocaine (XYLOCAINE) 2 % jelly Apply 1 application topically as needed.  . metFORMIN (GLUCOPHAGE) 500 MG tablet Take 500 mg by mouth 2 (two) times daily with a meal.  . metoprolol succinate (TOPROL-XL) 25 MG 24 hr tablet Take 1 tablet (25 mg total) by mouth daily. Take with or immediately following a meal. Take at night. (Patient taking differently: Take 25 mg by mouth daily. Take with or immediately following a meal.)  . senna (SENOKOT) 8.6 MG TABS tablet Take 2 tablets (17.2 mg total) by mouth at bedtime. (Patient not taking: Reported on 01/08/2020)  . vitamin B-12 (CYANOCOBALAMIN) 500 MCG tablet Take 500 mcg by mouth daily.   . Vitamin D, Ergocalciferol, (DRISDOL) 1.25 MG (50000 UNIT) CAPS capsule Take 50,000 Units by mouth every 7 (seven) days.   No current facility-administered medications for this visit. (Other)      REVIEW OF  SYSTEMS:    ALLERGIES Allergies  Allergen Reactions  . Cortisone Other (See Comments)    CBG thrown out of control    PAST MEDICAL HISTORY Past Medical History:  Diagnosis Date  . Carpal tunnel syndrome on both sides   . Chest pain   . Diabetes mellitus without complication ([place])   . GERD (gastroesophageal reflux disease)   . Headache   . Hypercholesteremia   . Hyperlipidemia   . PE (pulmonary embolism)   . Shortness of breath dyspnea    PE    Past Surgical History:  Procedure Laterality Date  . BILATERAL CARPAL TUNNEL RELEASE Bilateral   . CATARACT EXTRACTION W/PHACO Left 08/05/2006   Dr. Gershon Crane  . CATARACT  EXTRACTION W/PHACO Right 08/18/2006   Dr. Gershon Crane  . COLONOSCOPY    . COLONOSCOPY WITH PROPOFOL Left 09/20/2019   Procedure: COLONOSCOPY WITH PROPOFOL;  Surgeon: Ronnette Juniper, MD;  Location: [facility];  Service: Gastroenterology;  Laterality: Left;  . COLONOSCOPY WITH PROPOFOL N/A 10/03/2019   Procedure: COLONOSCOPY WITH PROPOFOL;  Surgeon: Wilford Corner, MD;  Location: [facility];  Service: Endoscopy;  Laterality: N/A;  . ELBOW SURGERY     BONE SPURS REMOVED B ELBOWS  . EYE SURGERY Bilateral    cataracts  . FOOT SURGERY     HEEL SPURS REMOVED B HEELS  . HEMOSTASIS CLIP PLACEMENT  09/20/2019   Procedure: HEMOSTASIS CLIP PLACEMENT;  Surgeon: Ronnette Juniper, MD;  Location: [facility];  Service: Gastroenterology;;  . HEMOSTASIS CLIP PLACEMENT  10/03/2019   Procedure: HEMOSTASIS CLIP PLACEMENT;  Surgeon: Wilford Corner, MD;  Location: [facility];  Service: Endoscopy;;  . HOT HEMOSTASIS N/A 09/20/2019   Procedure: HOT HEMOSTASIS (ARGON PLASMA COAGULATION/BICAP);  Surgeon: Ronnette Juniper, MD;  Location: [facility];  Service: Gastroenterology;  Laterality: N/A;  . I & D EXTREMITY Right 09/18/2014   Procedure: IRRIGATION AND DEBRIDEMENT RIGHT THUMB, open fracture repair;  Surgeon: Linna Hoff, MD;  Location: [facility];  Service: Orthopedics;  Laterality: Right;  . IR IVC FILTER PLMT / S&I Burke Keels GUID/MOD SED  10/03/2019  . LEG SURGERY Left    left thigh table saw accident, "cut to the bone", I&D/suturing  . SHOULDER SURGERY Left    Bone spur  . SPINE SURGERY  09/04/1985   lumbar spine  . SUBMUCOSAL INJECTION  10/03/2019   Procedure: SUBMUCOSAL INJECTION;  Surgeon: Wilford Corner, MD;  Location: [facility];  Service: Endoscopy;;  . TOE SURGERY      FAMILY HISTORY Family History  Problem Relation Age of Onset  . Cancer Mother   . Diabetes Father     SOCIAL HISTORY Social History   Tobacco Use  . Smoking status: Former Smoker    Packs/day: 0.50    Types: Cigarettes    Quit  date: 1964    Years since quitting: 57.3  . Smokeless tobacco: Former Systems developer  . Tobacco comment: smoking since 74 years old  Substance Use Topics  . Alcohol use: No  . Drug use: No         OPHTHALMIC EXAM: Base Eye Exam    Visual Acuity (Snellen - Linear)      Right Left   Dist [place] 20/30-2 20/70-2   Dist ph [place] NI 20/60+2       Tonometry (Tonopen, 10:35 AM)      Right Left   Pressure 7 9       Pupils      Pupils Dark Light Shape React APD   Right  PERRL 3 3 Round Minimal None   Left PERRL 3 3 Round Minimal None       Visual Fields (Counting fingers)      Left Right    Full Full       Extraocular Movement      Right Left    Full Full       Neuro/Psych    Oriented x3: Yes   Mood/Affect: Normal       Dilation    Right eye: 1.0% Mydriacyl, 2.5% Phenylephrine @ 10:35 AM        Slit Lamp and Fundus Exam    External Exam      Right Left   External Normal Normal       Slit Lamp Exam      Right Left   Lids/Lashes Normal Normal   Conjunctiva/Sclera White and quiet White and quiet   Cornea Clear Clear   Anterior Chamber Deep and quiet Deep and quiet   Iris Round and reactive Round and reactive   Lens Posterior chamber intraocular lens Posterior chamber intraocular lens   Vitreous Normal Normal          IMAGING AND PROCEDURES  Imaging and Procedures for 01/13/20  Focal Laser - OD - Right Eye       Time Out Confirmed correct patient, procedure, site, and patient consented.   Anesthesia Topical anesthesia was used. Anesthetic medications included Proparacaine 0.5%.   Laser Information The type of laser was diode. Color was yellow. The duration in seconds was 0.03. The spot size was 100 microns. Laser power was 130. Total spots was 127.   Post-op The patient tolerated the procedure well. There were no complications. The patient received written and verbal post procedure care education.   Notes DUTY CYCLE ON PULSE NAVILAS, WAS 20% SO DURATION  LESS                ASSESSMENT/PLAN:  No problem-specific Assessment & Plan notes found for this encounter.      ICD-10-CM   1. Severe nonproliferative diabetic retinopathy of right eye, with macular edema, associated with type 2 diabetes mellitus (HCC)  E11.3411 Focal Laser - OD - Right Eye    1.  2.  3.  Ophthalmic Meds Ordered this visit:  No orders of the defined types were placed in this encounter.      No follow-ups on file.  There are no Patient Instructions on file for this visit.   Explained the diagnoses, plan, and follow up with the patient and they expressed understanding.  Patient expressed understanding of the importance of proper follow up care.   Clent Demark Issac Moure M.D. Diseases & Surgery of the Retina and Vitreous Retina & Diabetic [place] 01/13/20     Abbreviations: M myopia (nearsighted); A astigmatism; H hyperopia (farsighted); P presbyopia; Mrx spectacle prescription;  CTL contact lenses; OD right eye; OS left eye; OU both eyes  XT exotropia; ET esotropia; PEK punctate epithelial keratitis; PEE punctate epithelial erosions; DES dry eye syndrome; MGD meibomian gland dysfunction; ATs artificial tears; PFAT's preservative free artificial tears; [place] nuclear sclerotic cataract; PSC posterior subcapsular cataract; ERM epi-retinal membrane; PVD posterior vitreous detachment; RD retinal detachment; DM diabetes mellitus; DR diabetic retinopathy; NPDR non-proliferative diabetic retinopathy; PDR proliferative diabetic retinopathy; CSME clinically significant macular edema; DME diabetic macular edema; dbh dot blot hemorrhages; CWS cotton wool spot; POAG primary open angle glaucoma; C/D cup-to-disc ratio; HVF humphrey visual field; GVF goldmann visual field; OCT optical coherence  tomography; IOP intraocular pressure; BRVO Branch retinal vein occlusion; CRVO central retinal vein occlusion; CRAO central retinal artery occlusion; BRAO branch retinal artery  occlusion; RT retinal tear; SB scleral buckle; PPV pars plana vitrectomy; VH Vitreous hemorrhage; PRP panretinal laser photocoagulation; IVK intravitreal kenalog; VMT vitreomacular traction; MH Macular hole;  NVD neovascularization of the disc; NVE neovascularization elsewhere; AREDS age related eye disease study; ARMD age related macular degeneration; POAG primary open angle glaucoma; EBMD epithelial/anterior basement membrane dystrophy; ACIOL anterior chamber intraocular lens; IOL intraocular lens; PCIOL posterior chamber intraocular lens; Phaco/IOL phacoemulsification with intraocular lens placement; [place] photorefractive keratectomy; LASIK laser assisted in situ keratomileusis; HTN hypertension; DM diabetes mellitus; COPD chronic obstructive pulmonary disease

## 2020-01-15 DIAGNOSIS — R972 Elevated prostate specific antigen [PSA]: Secondary | ICD-10-CM | POA: Diagnosis not present

## 2020-01-29 ENCOUNTER — Ambulatory Visit: Payer: Self-pay

## 2020-02-06 ENCOUNTER — Other Ambulatory Visit: Payer: Self-pay | Admitting: Cardiology

## 2020-02-10 DIAGNOSIS — L739 Follicular disorder, unspecified: Secondary | ICD-10-CM | POA: Diagnosis not present

## 2020-02-27 ENCOUNTER — Ambulatory Visit: Payer: HMO

## 2020-03-09 ENCOUNTER — Other Ambulatory Visit: Payer: Self-pay

## 2020-03-09 ENCOUNTER — Ambulatory Visit: Payer: HMO | Admitting: Cardiology

## 2020-03-09 ENCOUNTER — Encounter: Payer: Self-pay | Admitting: Cardiology

## 2020-03-09 VITALS — BP 132/80 | HR 54 | Resp 16 | Ht 68.5 in | Wt 201.0 lb

## 2020-03-09 DIAGNOSIS — Z95828 Presence of other vascular implants and grafts: Secondary | ICD-10-CM | POA: Diagnosis not present

## 2020-03-09 DIAGNOSIS — I2699 Other pulmonary embolism without acute cor pulmonale: Secondary | ICD-10-CM | POA: Diagnosis not present

## 2020-03-09 DIAGNOSIS — E785 Hyperlipidemia, unspecified: Secondary | ICD-10-CM | POA: Diagnosis not present

## 2020-03-09 DIAGNOSIS — I2510 Atherosclerotic heart disease of native coronary artery without angina pectoris: Secondary | ICD-10-CM

## 2020-03-09 DIAGNOSIS — I10 Essential (primary) hypertension: Secondary | ICD-10-CM | POA: Diagnosis not present

## 2020-03-09 DIAGNOSIS — Z794 Long term (current) use of insulin: Secondary | ICD-10-CM | POA: Diagnosis not present

## 2020-03-09 DIAGNOSIS — R0600 Dyspnea, unspecified: Secondary | ICD-10-CM

## 2020-03-09 DIAGNOSIS — I825Y3 Chronic embolism and thrombosis of unspecified deep veins of proximal lower extremity, bilateral: Secondary | ICD-10-CM | POA: Diagnosis not present

## 2020-03-09 DIAGNOSIS — R0609 Other forms of dyspnea: Secondary | ICD-10-CM

## 2020-03-09 DIAGNOSIS — E1169 Type 2 diabetes mellitus with other specified complication: Secondary | ICD-10-CM

## 2020-03-09 DIAGNOSIS — E1139 Type 2 diabetes mellitus with other diabetic ophthalmic complication: Secondary | ICD-10-CM | POA: Diagnosis not present

## 2020-03-09 DIAGNOSIS — E7800 Pure hypercholesterolemia, unspecified: Secondary | ICD-10-CM

## 2020-03-09 NOTE — Progress Notes (Signed)
Matthew Zimmerman Date of Birth: [birth year] MRN: [id number] Primary Care Provider:Kim, Jeneen Rinks, MD Former Cardiology Providers: Jeri Lager, APRN, FNP-C Primary Cardiologist: Rex Kras, DO, [facility] (established care 03/09/2020)    Date: 03/09/20 Last Visit: 09/08/2019  Chief Complaint  Patient presents with  . Hypertension  . Follow-up    6 months    HPI  Matthew Zimmerman is a 74 y.o.  male who presents to the office with a chief complaint of " hypertension and hyperlipidemia follow-up." Patient's past medical history and cardiovascular risk factors include: Hypertension, type 2 diabetes mellitus with complications, hyperlipidemia, former smoker, history of DVT/PE and status post IVC filter on oral anticoagulation, coronary artery calcification by CT scan, advanced age.  Patient was last seen in the office back in January 2021 by Jeri Lager, APRN, FNP-C and is now here to reestablish care and for his 24-month follow-up for the management of hypertension and hyperlipidemia.  Since last office visit patient states that he has been hospitalized several times due to deep venous thrombosis in bilateral lower extremities.  He is also had an episode of gastrointestinal bleeding.  He is also undergone IVC filter placement.  Patient states that he is back on his oral anticoagulation and tolerating medication well without any endorsement of bleeding.  Since the recent hospitalizations patient states that his overall functional status has reduced.  He walks with either a cane or walker.  He tries to avoid driving.  From a cardiovascular standpoint patient denies any chest pain at rest or with effort related activities.  He does have effort related dyspnea with minimal walking (i.e. going from the driveway up to the front door).  He is noted to have coronary artery calcification on CT studies in the past and is currently on baby aspirin and statin therapy.  However, he has not had a recent stress  test.  Hypertension:  Patient's office blood pressures are within acceptable range.  However, patient does not check his blood pressures at home.  Patient is asked to invest in a blood pressure cuff so that he could periodically check his blood pressures given multiple cardiovascular risk factors.  He verbalizes understanding.  Hyperlipidemia: No recent lipid profile to review.  Continues to take Lipitor 10 mg p.o. nightly.  Does not endorse any myalgias.  History of deep venous thrombosis, pulmonary embolism, coronary artery calcification. Denies prior history of myocardial infarction, congestive heart failure,  stroke, transient ischemic attack.  FUNCTIONAL STATUS: Limited after his recent DVT/PE episode. But tries to do treadmill atleast 62min per day.    ALLERGIES: Allergies  Allergen Reactions  . Cortisone Other (See Comments)    CBG thrown out of control     MEDICATION LIST PRIOR TO VISIT: Current Outpatient Medications on File Prior to Visit  Medication Sig Dispense Refill  . apixaban (ELIQUIS) 5 MG TABS tablet Take 1 tablet (5 mg total) by mouth 2 (two) times daily. 60 tablet 1  . aspirin EC 81 MG tablet Take 81 mg by mouth every evening.     [name] atorvastatin (LIPITOR) 20 MG tablet Take 10 mg by mouth every evening.     [name] augmented betamethasone dipropionate (DIPROLENE-AF) 0.05 % cream Apply 1 application topically daily as needed (itchy skin near bottom).     . BD INSULIN SYRINGE U/F 31G X 5/16" 0.5 ML MISC 2 (two) times daily.    . clotrimazole-betamethasone (LOTRISONE) cream Apply 1 application topically 2 (two) times daily as needed (itching).     [name]  dapagliflozin propanediol (FARXIGA) 5 MG TABS tablet Take 5 mg by mouth daily.    . diclofenac sodium (VOLTAREN) 1 % GEL Apply 1 application topically 4 (four) times daily as needed (pain).    . furosemide (LASIX) 80 MG tablet Take 80 mg by mouth daily.    [name] glucose blood (ONE TOUCH ULTRA TEST) test strip Use to test blood sugar  twice a day    . insulin NPH-regular Human (NOVOLIN 70/30) (70-30) 100 UNIT/ML injection Inject 25-35 Units into the skin 2 (two) times daily with a meal. Humulin    . Lancets (ONETOUCH DELICA PLUS [id number]) MISC Use to test blood sugar twice daily    . lidocaine (XYLOCAINE) 2 % jelly Apply 1 application topically as needed. 85 g 0  . metoprolol succinate (TOPROL-XL) 25 MG 24 hr tablet Take 1 tablet (25 mg total) by mouth daily. Take with or immediately following a meal. 90 tablet 0  . senna (SENOKOT) 8.6 MG TABS tablet Take 2 tablets (17.2 mg total) by mouth at bedtime. 120 tablet 0  . vitamin B-12 (CYANOCOBALAMIN) 500 MCG tablet Take 500 mcg by mouth daily.     . Vitamin D, Ergocalciferol, (DRISDOL) 1.25 MG (50000 UNIT) CAPS capsule Take 50,000 Units by mouth every 7 (seven) days.     No current facility-administered medications on file prior to visit.    PAST MEDICAL HISTORY: Past Medical History:  Diagnosis Date  . Carpal tunnel syndrome on both sides   . Chest pain   . Coronary artery calcification seen on CAT scan   . Diabetes mellitus without complication ([place])   . DVT (deep venous thrombosis) ([place])   . GERD (gastroesophageal reflux disease)   . Headache   . Hypercholesteremia   . Hyperlipidemia   . Hypertension   . PE (pulmonary embolism)   . Shortness of breath dyspnea    PE     PAST SURGICAL HISTORY: Past Surgical History:  Procedure Laterality Date  . BILATERAL CARPAL TUNNEL RELEASE Bilateral   . CATARACT EXTRACTION W/PHACO Left 08/05/2006   Dr. Gershon Crane  . CATARACT EXTRACTION W/PHACO Right 08/18/2006   Dr. Gershon Crane  . COLONOSCOPY    . COLONOSCOPY WITH PROPOFOL Left 09/20/2019   Procedure: COLONOSCOPY WITH PROPOFOL;  Surgeon: Ronnette Juniper, MD;  Location: [facility];  Service: Gastroenterology;  Laterality: Left;  . COLONOSCOPY WITH PROPOFOL N/A 10/03/2019   Procedure: COLONOSCOPY WITH PROPOFOL;  Surgeon: Wilford Corner, MD;  Location: [facility];  Service:  Endoscopy;  Laterality: N/A;  . ELBOW SURGERY     BONE SPURS REMOVED B ELBOWS  . EYE SURGERY Bilateral    cataracts  . FOOT SURGERY     HEEL SPURS REMOVED B HEELS  . HEMOSTASIS CLIP PLACEMENT  09/20/2019   Procedure: HEMOSTASIS CLIP PLACEMENT;  Surgeon: Ronnette Juniper, MD;  Location: [facility];  Service: Gastroenterology;;  . HEMOSTASIS CLIP PLACEMENT  10/03/2019   Procedure: HEMOSTASIS CLIP PLACEMENT;  Surgeon: Wilford Corner, MD;  Location: [facility];  Service: Endoscopy;;  . HOT HEMOSTASIS N/A 09/20/2019   Procedure: HOT HEMOSTASIS (ARGON PLASMA COAGULATION/BICAP);  Surgeon: Ronnette Juniper, MD;  Location: [facility];  Service: Gastroenterology;  Laterality: N/A;  . I & D EXTREMITY Right 09/18/2014   Procedure: IRRIGATION AND DEBRIDEMENT RIGHT THUMB, open fracture repair;  Surgeon: Linna Hoff, MD;  Location: [facility];  Service: Orthopedics;  Laterality: Right;  . IR IVC FILTER PLMT / S&I Burke Keels GUID/MOD SED  10/03/2019  . LEG SURGERY Left  left thigh table saw accident, "cut to the bone", I&D/suturing  . SHOULDER SURGERY Left    Bone spur  . SPINE SURGERY  09/04/1985   lumbar spine  . SUBMUCOSAL INJECTION  10/03/2019   Procedure: SUBMUCOSAL INJECTION;  Surgeon: Wilford Corner, MD;  Location: [facility];  Service: Endoscopy;;  . TOE SURGERY      FAMILY HISTORY: The patient's family history includes Cancer in his mother; Diabetes in his father.   SOCIAL HISTORY:  The patient  reports that he quit smoking about 57 years ago. His smoking use included cigarettes. He smoked 0.50 packs per day. He has quit using smokeless tobacco. He reports that he does not drink alcohol and does not use drugs.  Review of Systems  Constitutional: Negative for chills and fever.  HENT: Negative for hoarse voice and nosebleeds.   Eyes: Negative for discharge, double vision and pain.  Cardiovascular: Positive for dyspnea on exertion. Negative for chest pain, claudication, leg swelling, near-syncope,  orthopnea, palpitations, paroxysmal nocturnal dyspnea and syncope.  Respiratory: Negative for hemoptysis and shortness of breath.   Musculoskeletal: Negative for muscle cramps and myalgias.  Gastrointestinal: Negative for abdominal pain, constipation, diarrhea, hematemesis, hematochezia, melena, nausea and vomiting.  Neurological: Negative for dizziness and light-headedness.    PHYSICAL EXAM: Vitals with BMI 03/09/2020 12/30/2019 10/28/2019  Height 5' 8.5" 5\' 8"  -  Weight 201 lbs 204 lbs -  BMI 27.03 50.09 -  Systolic 381 829 937  Diastolic 80 82 88  Pulse 54 58 66   CONSTITUTIONAL: Well-developed and well-nourished. No acute distress.  SKIN: Skin is warm and dry. No rash noted. No cyanosis. No pallor. No jaundice HEAD: Normocephalic and atraumatic.  EYES: No scleral icterus MOUTH/THROAT: Moist oral membranes.  NECK: No JVD present. No thyromegaly noted. No carotid bruits  LYMPHATIC: No visible cervical adenopathy.  CHEST Normal respiratory effort. No intercostal retractions  LUNGS: Clear to auscultation bilaterally.  No stridor. No wheezes. No rales.  CARDIOVASCULAR: Regular rate and rhythm, positive S1-S2, no murmurs rubs or gallops appreciated. ABDOMINAL: Obese, soft, nontender, nondistended, positive bowel sounds in all 4 quadrants.  No apparent ascites.  EXTREMITIES: No peripheral edema.  2+ bilateral femoral pulses, 1+ bilateral popliteal and dorsalis pedis pulses. HEMATOLOGIC: No significant bruising NEUROLOGIC: Oriented to person, place, and time. Nonfocal. Normal muscle tone.  PSYCHIATRIC: Normal mood and affect. Normal behavior. Cooperative  RADIOLOGY: CT of abdomen 01/16/2018:   Aortic atherosclerosis, in addition to least 2 vessel coronary artery disease.   There are calcifications of the aortic valve.   CTA of chest 02/25/2019: Small pulmonary embolus in right lower lobe pulmonary arterial branches.   CARDIAC DATABASE: EKG: 04/09/2019: Sinus bradycardia at 56 bpm,  normal axis, RBBB. 1 PVC. Abnormal EKG  Echocardiogram: 05/05/2019: LVEF 55%, mild LVH, grade 1 diastolic impairment, no significant valvular heart disease.  Stress Testing:  NA  Heart Catheterization: None  IVC/Iliac Study 10/14/2019: Abdominal Aorta: There is evidence of abnormal dilatation of the distal Abdominal aorta. IVC/Iliac: There is evidence of acute thrombus involving the IVC. There is evidence of acute thrombus involving the right common iliac vein, left common iliac vein, right external iliac vein, left external iliac vein. Visualization of proximal Inferior Vena Cava was limited.  US Venous duplex 10/14/2019: RIGHT:  - Findings consistent with acute deep vein thrombosis involving the right common femoral vein, SF junction, right femoral vein, right proximal  profunda vein, right popliteal vein, right posterior tibial veins, right peroneal veins, and right gastrocnemius veins.  -  No cystic structure found in the popliteal fossa.    LEFT:  - Findings consistent with acute deep vein thrombosis involving the left common femoral vein, SF junction, left femoral vein, left proximal profunda vein, left popliteal vein, and left gastrocnemius veins.  - No cystic structure found in the popliteal fossa.   LABORATORY DATA: CBC Latest Ref Rng & Units 10/28/2019 10/17/2019 10/17/2019  WBC 4.0 - 10.5 K/uL 11.7(H) 10.9(H) 11.6(H)  Hemoglobin 13.0 - 17.0 g/dL 11.0(L) 9.9(L) 9.5(L)  Hematocrit 39 - 52 % 36.6(L) 31.2(L) 30.6(L)  Platelets 150 - 400 K/uL 441(H) 310 271    CMP Latest Ref Rng & Units 10/28/2019 10/16/2019 10/15/2019  Glucose 70 - 99 mg/dL 272(H) 212(H) 149(H)  BUN 8 - 23 mg/dL 15 30(H) 40(H)  Creatinine 0.61 - 1.24 mg/dL 1.15 1.27(H) 1.71(H)  Sodium 135 - 145 mmol/L 139 134(L) 138  Potassium 3.5 - 5.1 mmol/L 4.3 4.1 4.1  Chloride 98 - 111 mmol/L 108 108 112(H)  CO2 22 - 32 mmol/L 16(L) 17(L) 17(L)  Calcium 8.9 - 10.3 mg/dL 9.4 8.3(L) 8.3(L)  Total Protein 6.5 - 8.1 g/dL  7.4 6.6 -  Total Bilirubin 0.3 - 1.2 mg/dL 1.0 1.2 -  Alkaline Phos 38 - 126 U/L 79 56 -  AST 15 - 41 U/L 28 11(L) -  ALT 0 - 44 U/L 26 12 -    Lipid Panel  No results found for: CHOL, TRIG, HDL, CHOLHDL, VLDL, LDLCALC, LDLDIRECT, LABVLDL  Lab Results  Component Value Date   HGBA1C 7.5 (H) 09/19/2019   HGBA1C 7.2 (H) 05/20/2014   No components found for: NTPROBNP Lab Results  Component Value Date   TSH 6.373 (H) 10/13/2019   TSH 1.36 05/12/2010    Cardiac Panel (last 3 results) No results for input(s): CKTOTAL, CKMB, TROPONINIHS, RELINDX in the last 72 hours.  IMPRESSION:    ICD-10-CM   1. Essential hypertension  I10   2. Coronary artery calcification seen on CT scan  I25.10   3. Type 2 diabetes mellitus with other ophthalmic complication, with long-term current use of insulin (HCC)  E11.39 Lipid Panel With LDL/HDL Ratio   Z79.4   4. Long-term insulin use (HCC)  Z79.4   5. Pulmonary embolism on right (HCC)  I26.99   6. Chronic deep vein thrombosis (DVT) of proximal vein of both lower extremities (HCC)  I82.5Y3   7. Presence of IVC filter  Z95.828   8. Hypercholesterolemia  E78.00 Lipid Panel With LDL/HDL Ratio  9. Dyspnea on exertion  R06.00 PCV MYOCARDIAL PERFUSION WITH LEXISCAN  10. Coronary artery calcification seen on computed tomography  I25.10 PCV MYOCARDIAL PERFUSION WITH LEXISCAN  11. Type 2 diabetes mellitus with hyperlipidemia [facility])  E11.69    E78.5      RECOMMENDATIONS: Nalu Troublefield is a 74 y.o. male whose past medical history and cardiovascular risk factors include: Hypertension, type 2 diabetes mellitus with complications, hyperlipidemia, former smoker, history of DVT/PE and status post IVC filter on oral anticoagulation, coronary artery calcification by CT scan, advanced age.  Dyspnea on exertion:  Patient symptoms of dyspnea on exertion appear to be chronic and stable.  However due to multiple cardiovascular risk factors would recommend an ischemic  evaluation since he has not had a recent stress test.  Nuclear stress test recommended to evaluate for reversible ischemia.  Patient is also asked to discuss this further with his primary care physician as he may have underlying pulmonary etiologies of his shortness of breath with effort related  activities given his history of PE and former smoking.  Benign essential hypertension with chronic kidney disease stage II . Office blood pressure is at goal.  . Medication reconciled.  [name] He is asked to keep a log of both blood pressure and pulse so that medications can be titrated based on a trend as opposed to isolated blood pressure readings in the office. . If the blood pressure is consistently greater than 142mmHg patient is asked to call the office or his primary care provider's office for medication titration sooner than the next office visit.  . Low salt diet recommended. A diet that is rich in fruits, vegetables, legumes, and low-fat dairy products and low in snacks, sweets, and meats (such as the Dietary Approaches to Stop Hypertension [DASH] diet).   Hyperlipidemia, mixed:  Currently on statin therapy.  Check fasting lipid profile.  Does not endorse myalgias.  Further recommendations to follow.  Coronary artery calcification: Continue aspirin 81 mg p.o. daily and statin therapy.  See above.  Insulin-dependent diabetes mellitus type 2 with complications of retinopathy and neuropathy: Patient educated on importance of glycemic control given his multiple cardiovascular risk factors.   FINAL MEDICATION LIST END OF ENCOUNTER: No orders of the defined types were placed in this encounter.   Medications Discontinued During This Encounter  Medication Reason  . metFORMIN (GLUCOPHAGE) 500 MG tablet Discontinued by provider  . acetaminophen (TYLENOL) 325 MG tablet Patient Preference     Current Outpatient Medications:  .  apixaban (ELIQUIS) 5 MG TABS tablet, Take 1 tablet (5 mg total) by  mouth 2 (two) times daily., Disp: 60 tablet, Rfl: 1 .  aspirin EC 81 MG tablet, Take 81 mg by mouth every evening. , Disp: , Rfl:  .  atorvastatin (LIPITOR) 20 MG tablet, Take 10 mg by mouth every evening. , Disp: , Rfl:  .  augmented betamethasone dipropionate (DIPROLENE-AF) 0.05 % cream, Apply 1 application topically daily as needed (itchy skin near bottom). , Disp: , Rfl:  .  BD INSULIN SYRINGE U/F 31G X 5/16" 0.5 ML MISC, 2 (two) times daily., Disp: , Rfl:  .  clotrimazole-betamethasone (LOTRISONE) cream, Apply 1 application topically 2 (two) times daily as needed (itching). , Disp: , Rfl:  .  dapagliflozin propanediol (FARXIGA) 5 MG TABS tablet, Take 5 mg by mouth daily., Disp: , Rfl:  .  diclofenac sodium (VOLTAREN) 1 % GEL, Apply 1 application topically 4 (four) times daily as needed (pain)., Disp: , Rfl:  .  furosemide (LASIX) 80 MG tablet, Take 80 mg by mouth daily., Disp: , Rfl:  .  glucose blood (ONE TOUCH ULTRA TEST) test strip, Use to test blood sugar twice a day, Disp: , Rfl:  .  insulin NPH-regular Human (NOVOLIN 70/30) (70-30) 100 UNIT/ML injection, Inject 25-35 Units into the skin 2 (two) times daily with a meal. Humulin, Disp: , Rfl:  .  Lancets (ONETOUCH DELICA PLUS [id number]) MISC, Use to test blood sugar twice daily, Disp: , Rfl:  .  lidocaine (XYLOCAINE) 2 % jelly, Apply 1 application topically as needed., Disp: 85 g, Rfl: 0 .  metoprolol succinate (TOPROL-XL) 25 MG 24 hr tablet, Take 1 tablet (25 mg total) by mouth daily. Take with or immediately following a meal., Disp: 90 tablet, Rfl: 0 .  senna (SENOKOT) 8.6 MG TABS tablet, Take 2 tablets (17.2 mg total) by mouth at bedtime., Disp: 120 tablet, Rfl: 0 .  vitamin B-12 (CYANOCOBALAMIN) 500 MCG tablet, Take 500 mcg by mouth daily. ,  Disp: , Rfl:  .  Vitamin D, Ergocalciferol, (DRISDOL) 1.25 MG (50000 UNIT) CAPS capsule, Take 50,000 Units by mouth every 7 (seven) days., Disp: , Rfl:   Orders Placed This Encounter  Procedures    . Lipid Panel With LDL/HDL Ratio  . PCV MYOCARDIAL PERFUSION WITH LEXISCAN   --Continue cardiac medications as reconciled in final medication list. --Return in about 6 weeks (around 04/20/2020) for review test results.. Or sooner if needed. --Continue follow-up with your primary care physician regarding the management of your other chronic comorbid conditions.  Patient's questions and concerns were addressed to his satisfaction. He voices understanding of the instructions provided during this encounter.   This note was created using a voice recognition software as a result there may be grammatical errors inadvertently enclosed that do not reflect the nature of this encounter. Every attempt is made to correct such errors.  Rex Kras, [place], [facility]  Pager: [phone] Office: [phone]

## 2020-03-10 ENCOUNTER — Other Ambulatory Visit: Payer: Self-pay | Admitting: Cardiology

## 2020-03-10 DIAGNOSIS — E1139 Type 2 diabetes mellitus with other diabetic ophthalmic complication: Secondary | ICD-10-CM | POA: Diagnosis not present

## 2020-03-10 DIAGNOSIS — Z794 Long term (current) use of insulin: Secondary | ICD-10-CM | POA: Diagnosis not present

## 2020-03-10 DIAGNOSIS — E7800 Pure hypercholesterolemia, unspecified: Secondary | ICD-10-CM | POA: Diagnosis not present

## 2020-03-11 LAB — LIPID PANEL WITH LDL/HDL RATIO
Cholesterol, Total: 149 mg/dL (ref 100–199)
HDL: 51 mg/dL (ref >39)
LDL Chol Calc (NIH): 80 mg/dL (ref 0–99)
LDL/HDL Ratio: 1.6 ratio (ref 0.0–3.6)
Triglycerides: 94 mg/dL (ref 0–149)
VLDL Cholesterol Cal: 18 mg/dL (ref 5–40)

## 2020-03-16 ENCOUNTER — Other Ambulatory Visit: Payer: Self-pay

## 2020-03-16 DIAGNOSIS — E7800 Pure hypercholesterolemia, unspecified: Secondary | ICD-10-CM

## 2020-03-16 DIAGNOSIS — E1139 Type 2 diabetes mellitus with other diabetic ophthalmic complication: Secondary | ICD-10-CM

## 2020-03-19 DIAGNOSIS — Z0000 Encounter for general adult medical examination without abnormal findings: Secondary | ICD-10-CM | POA: Diagnosis not present

## 2020-03-19 DIAGNOSIS — D649 Anemia, unspecified: Secondary | ICD-10-CM | POA: Diagnosis not present

## 2020-03-19 DIAGNOSIS — E785 Hyperlipidemia, unspecified: Secondary | ICD-10-CM | POA: Diagnosis not present

## 2020-03-19 DIAGNOSIS — E118 Type 2 diabetes mellitus with unspecified complications: Secondary | ICD-10-CM | POA: Diagnosis not present

## 2020-03-19 DIAGNOSIS — Z125 Encounter for screening for malignant neoplasm of prostate: Secondary | ICD-10-CM | POA: Diagnosis not present

## 2020-03-19 DIAGNOSIS — E162 Hypoglycemia, unspecified: Secondary | ICD-10-CM | POA: Diagnosis not present

## 2020-03-19 DIAGNOSIS — E539 Vitamin B deficiency, unspecified: Secondary | ICD-10-CM | POA: Diagnosis not present

## 2020-03-19 DIAGNOSIS — I10 Essential (primary) hypertension: Secondary | ICD-10-CM | POA: Diagnosis not present

## 2020-03-19 DIAGNOSIS — R972 Elevated prostate specific antigen [PSA]: Secondary | ICD-10-CM | POA: Diagnosis not present

## 2020-03-22 ENCOUNTER — Ambulatory Visit: Payer: HMO

## 2020-03-22 ENCOUNTER — Other Ambulatory Visit: Payer: Self-pay

## 2020-03-22 DIAGNOSIS — I2510 Atherosclerotic heart disease of native coronary artery without angina pectoris: Secondary | ICD-10-CM

## 2020-03-22 DIAGNOSIS — R0600 Dyspnea, unspecified: Secondary | ICD-10-CM

## 2020-03-22 DIAGNOSIS — R0609 Other forms of dyspnea: Secondary | ICD-10-CM

## 2020-03-29 ENCOUNTER — Ambulatory Visit: Payer: Self-pay

## 2020-03-30 DIAGNOSIS — E1140 Type 2 diabetes mellitus with diabetic neuropathy, unspecified: Secondary | ICD-10-CM | POA: Diagnosis not present

## 2020-03-30 DIAGNOSIS — Z0000 Encounter for general adult medical examination without abnormal findings: Secondary | ICD-10-CM | POA: Diagnosis not present

## 2020-03-30 DIAGNOSIS — N189 Chronic kidney disease, unspecified: Secondary | ICD-10-CM | POA: Diagnosis not present

## 2020-03-30 DIAGNOSIS — Z125 Encounter for screening for malignant neoplasm of prostate: Secondary | ICD-10-CM | POA: Diagnosis not present

## 2020-03-30 DIAGNOSIS — I2510 Atherosclerotic heart disease of native coronary artery without angina pectoris: Secondary | ICD-10-CM | POA: Diagnosis not present

## 2020-03-30 DIAGNOSIS — E785 Hyperlipidemia, unspecified: Secondary | ICD-10-CM | POA: Diagnosis not present

## 2020-03-30 DIAGNOSIS — N182 Chronic kidney disease, stage 2 (mild): Secondary | ICD-10-CM | POA: Diagnosis not present

## 2020-03-30 DIAGNOSIS — D649 Anemia, unspecified: Secondary | ICD-10-CM | POA: Diagnosis not present

## 2020-03-30 DIAGNOSIS — E118 Type 2 diabetes mellitus with unspecified complications: Secondary | ICD-10-CM | POA: Diagnosis not present

## 2020-03-30 DIAGNOSIS — Z7901 Long term (current) use of anticoagulants: Secondary | ICD-10-CM | POA: Diagnosis not present

## 2020-03-30 DIAGNOSIS — I10 Essential (primary) hypertension: Secondary | ICD-10-CM | POA: Diagnosis not present

## 2020-04-01 ENCOUNTER — Other Ambulatory Visit: Payer: Self-pay

## 2020-04-01 NOTE — Patient Outreach (Signed)
  [facility]) Care Management Chronic Special Needs Program    04/01/2020  Name: Matthew Zimmerman, DOB: 04-14-1946  MRN: [id number]   [name] is enrolled in a chronic special needs plan for Diabetes.Telephone call to client for CSNP assessment follow up. Unable to reach. HIPAA compliant voice message left with call back phone number and return call request.   PLAN; RNCM will attempt 2nd telephone call to client in 1 week.   Quinn Plowman RN,BSN,CCM [facility] Management [phone]

## 2020-04-05 ENCOUNTER — Other Ambulatory Visit: Payer: Self-pay

## 2020-04-05 NOTE — Patient Outreach (Signed)
  [facility]) Care Management Chronic Special Needs Program    04/05/2020  Name: Matthew Zimmerman, DOB: [birth year]  MRN: [id number]   [name] is enrolled in a chronic special needs plan for Diabetes. Second telephone call to client / caregiver for CSNP assessment follow up. Unable to reach. HIPAA compliant voice message left with call back phone number and return call request.   PLAN; RNCM will attempt 3rd telephone outreach in 1 week.   Quinn Plowman RN,BSN,CCM [facility] Care Management [phone]

## 2020-04-06 ENCOUNTER — Other Ambulatory Visit: Payer: Self-pay

## 2020-04-06 NOTE — Patient Outreach (Signed)
[facility] [facility]) Care Management Chronic Special Needs Program  04/06/2020  Name: Matthew Zimmerman DOB: [birth year]  MRN: [id number]  [name] is enrolled in a chronic special needs plan for Diabetes. Telephone call to client for CSNP assessment follow up. Unable to reach. HIPAA compliant voice message left with call back phone number and return call request.     Goals Addressed              This Visit's Progress   .  "Keep my A1C under 7.0%" (pt-stated)   Not on track     Your last  Hgb A1C on 09/19/19 of 7.5%  Have your A1c checked ever 6 months if you are at goal or every 3 months if you are not at goal  Check blood sugars daily before eating with goal of 80-130. You can also check 1 1/2 hour after eating with goal of 180 or less.  Plan to eat low carbohydrate and low salt meals , Watch portion sizes and avoid sugar sweetened drinks.        .  Caregiver will verbalize understanding of diabetic diet food options/ choices within 3 months.   On track     Contact your RN case manager if you are interested in a referral to the [employer] health coach to assist with dietary management of diabetes.  RN case manager will send client education article:  Guide to eating when you have diabetes.        .  Client understands the importance of follow-up with providers by attending scheduled visits   On track     Most recent follow up with primary care provider 12/12/19 and 02/10/20 Cardiologist 09/08/19 and 03/09/20  Continue to follow up with your provider as recommended.     .  Client will verbalize knowledge of self management of Hypertension as evidences by BP reading of 140/90 or less; or as defined by provider   On track     Continue to take your medications as prescribed. Do not skip doses of your blood pressure medications.  The medicine does not work as well if you skip doses. Skipping doses also puts you at risk for problems.  Plan to eat low salt and  heart healthy meals full of fruits , vegetables, whole grains, lean protein, and limit fat and sugars.  Follow up with your doctor as recommended.  Call your doctor if you have questions.       [name]  HEMOGLOBIN A1C < 7        Continue to follow  diabetes self management actions:    Continue Glucose monitoring per provider recommendation  Visit provider every 3-6 months as directed  Hbg A1C level every 3-6 months.  Continue to adhere to a Carbohydrate controlled meal plan  Continue to take your diabetes medication as prescribed by provider ( contact provider for questions/ concerns)  Continue to be Physical activity as tolerated    .  Maintain timely refills of diabetic medication as prescribed within the year .   On track     Continue to maintain timely refills of your medications Continue to take your medication as prescribed.  Contact your RN case manager [phone]) if you are unable to obtain your medications Call your doctor for questions/ concerns.     .  COMPLETED: Obtain annual  Lipid Profile, LDL-C   On track     Lipid profile completed 03/19/20     .  COMPLETED:  Obtain Annual Eye (retinal)  Exam    On track     Diabetic eye exam completed on 01/13/20     .  Obtain Annual Foot Exam   Not on track     Not able to determine foot exam It is important that feet be checked regularly.  Discuss this with your doctor at your next visit.  Diabetes can affect the nerves in your feet causing decreased feeling or numbness. If your are experiencing these symptoms please discuss with your doctor.      .  Obtain annual screen for micro albuminuria (urine) , nephropathy (kidney problems)   Not on track     Urine for protein test completed on 02/21/19 Diabetes can affect your kidneys.  It is importatnt for your doctor to check your urine at least once a year.  These test show how your kidneys are working.     .  Obtain Hemoglobin A1C at least 2 times per year   On track     Hgb A1C  test completed on 08/07/19 and 09/19/19.  Have your A1c checked every 6 months if you are at goal or every 3 months if you are not at goal.       .  Remain free of Covid 19 infection in 2021 (pt-stated)   On track     Wife reports client has completed his 2 series covid shot. RN case manager will send client education article: YSHUO 37- after you have been vaccinated.     .  Visit Primary Care Provider or Endocrinologist at least 2 times per year    On track     Client saw primary care provider on 11/04/19 and 12/12/19 and 02/10/20 Continue to see your primary care provider as recommended.        Plan:  Send successful outreach letter with a copy of their individualized care plan, Send individual care plan to provider and Send educational material  Chronic care management coordinator will outreach in:  3 Months    Quinn Plowman RN,BSN,CCM [facility] [phone]     .

## 2020-04-07 ENCOUNTER — Other Ambulatory Visit: Payer: Self-pay

## 2020-04-09 ENCOUNTER — Other Ambulatory Visit: Payer: Self-pay

## 2020-04-09 NOTE — Patient Outreach (Addendum)
[facility]) Care Management Chronic Special Needs Program    04/09/2020  Name: Matthew Zimmerman, DOB: Mar 25, 1946  MRN: [id number]   [name] is enrolled in a chronic special needs plan for Diabetes. Telephone call to Vassie Moment, PA.  Discussed client's elevated blood sugar level concerns in the  200's fasting and evening blood sugars of 400's.  Informed PA wife/ caregiver is requesting referral to endocrinologist for client.  PA states he will refer client to Dr. Garnet Koyanagi, endocrinologist. Also, discussed with PA wife's request for prescription renewal for Senna (Senokot).  PA states he will send client a 90 day prescription supply to [facility].  RNCM contacted clients wife. HIPAA verified.  Informed wife of endocrinology referral and renewed Senna prescription. Wife verbalized appreciation for assisting her in these matters.  Goals    .   Acknowledge receipt of Advanced Directive package      RN case manager will resend client Advance Directive packet at caregiver request.     .  "Keep my A1C under 7.0%" (pt-stated)      Your last  Hgb A1C on 03/30/20 was 9.0 per caregiver/spouse Discussed diabetes self management actions:  Glucose monitoring per provider recommendation  Check feet daily  Visit provider every 3-6 months as directed  Hbg A1C level every 3-6 months.  Eye Exam yearly  Carbohydrate controlled meal planning  Taking diabetes medication as prescribed by provider  Physical activity  Contact your primary care provider to discuss possible referral to endocrinologist.  RN case manager will contact clients primary care provider to discuss elevated blood sugars as reported by caregiver.       .  caregiver will report client has received a referral to endocrinologist in 3 months      Caregiver advised to contact client's primary care provider to request endocrinology referral RN case manager will contact client's  primary care provider to discuss elevated blood sugar concerns and caregiver request for endocrinology referral.     .  Caregiver will verbalize understanding of diabetic diet food options/ choices within 3 months.      RN case manager will send client education article: Diabetes and diet Contact your RN case manager if you are interested in a referral to the [employer] health coach to assist with dietary management of diabetes.         .  Client and/ or cargiver will verbalize blood sugars within goal of 80-130 fasting and/ or 180 or less postprandial within 3 months.      RN case manager will contact client's primary care provider to discuss elevated fasting (200's) and evening ( 400's) blood sugars RN case manager will request referral for client to endocrinologist.  Continue to take your medications as prescribed.      .  Client will verbalize knowledge of self management of Hypertension as evidences by BP reading of 140/90 or less; or as defined by provider      RN case manager will send client education article: High blood pressure in adults.  Continue to take your medications as prescribed.  Follow up with your doctor as recommended.  Call your doctor if you have questions.       [name]  HEMOGLOBIN A1C < 7      Continue to follow  diabetes self management actions:    Continue Glucose monitoring per provider recommendation  Visit provider every 3-6 months as directed  Hbg A1C level every 3-6 months.  Continue  to adhere to a Carbohydrate controlled meal plan  Continue to take your diabetes medication as prescribed by provider ( contact provider for questions/ concerns)  Continue to be Physical activity as tolerated    .  Maintain timely refills of diabetic medication as prescribed within the year .      Continue to maintain timely refills of your medications Continue to take your medication as prescribed.  Contact your RN case manager [phone]) if you are  unable to obtain your medications Call your doctor for questions/ concerns.  Contact your pharmacy to confirm receipt of Senna ( Senokot) prescription.     .  Obtain Annual Foot Exam      Not able to determine foot exam It is important that feet be checked regularly.  Discuss this with your doctor at your next visit.  Diabetes can affect the nerves in your feet causing decreased feeling or numbness. If your are experiencing these symptoms please discuss with your doctor.      .  Obtain annual screen for micro albuminuria (urine) , nephropathy (kidney problems)      Urine for protein test completed on 02/21/19 Diabetes can affect your kidneys.  It is importatnt for your doctor to check your urine at least once a year.  These test show how your kidneys are working.     .  Obtain Hemoglobin A1C at least 2 times per year      Hgb A1C test completed on 08/07/19 and 09/19/19.  Have your A1c checked every 6 months if you are at goal or every 3 months if you are not at goal.       .  Remain free of Covid 19 infection in 2021 (pt-stated)      Wife reports client has completed his 2 series covid shot. RN case manager will send client education article: SFSEL 95- after you have been vaccinated.     .  Visit Primary Care Provider or Endocrinologist at least 2 times per year       Client saw primary care provider on 11/04/19 and 12/12/19 and 02/10/20 Continue to see your primary care provider as recommended.         PLAN; RNCM will follow up with client at next scheduled outreach.   Quinn Plowman RN,BSN,CCM [facility] [phone]

## 2020-04-09 NOTE — Patient Outreach (Signed)
[facility]) Care Management Chronic Special Needs Program Late entry for 04/07/20 04/09/2020  Name: Matthew Zimmerman DOB: 1946-01-08  MRN: [id number]  [name] is enrolled in a chronic special needs plan for Diabetes. Telephone call to client's wife/ caregiver, Cephas Revard. HIPAA verified for client. Wife reports client is getting a little better. She states client is walking better and legs are not swollen as much in the thigh area. She reports client still has swelling in his lower legs along with discomfort. She reports client's doctors are aware.   Wife reports client's blood sugars are elevated at 200's fasting and 400's in the evening.  She reports she is helping him to watch what he eats, he takes his medication but his blood sugars are still elevated.  Wife states she is very concerned and feels client needs to be referred to an endocrinologist.  Wife states she has been unable to refill client's Senekot medication. She reports this works for him and she would like to have the prescription renewed.     Goals Addressed              This Visit's Progress   .   Acknowledge receipt of Advanced Directive package        RN case manager will resend client Advance Directive packet at caregiver request.     .  "Keep my A1C under 7.0%" (pt-stated)        Your last  Hgb A1C on 03/30/20 was 9.0 per caregiver/spouse Discussed diabetes self management actions:  Glucose monitoring per provider recommendation  Check feet daily  Visit provider every 3-6 months as directed  Hbg A1C level every 3-6 months.  Eye Exam yearly  Carbohydrate controlled meal planning  Taking diabetes medication as prescribed by provider  Physical activity  Contact your primary care provider to discuss possible referral to endocrinologist.  RN case manager will contact clients primary care provider to discuss elevated blood sugars as reported by caregiver.       .   caregiver will report client has received a referral to endocrinologist in 3 months        Caregiver advised to contact client's primary care provider to request endocrinology referral RN case manager will contact client's primary care provider to discuss elevated blood sugar concerns and caregiver request for endocrinology referral.     .  Caregiver will verbalize understanding of diabetic diet food options/ choices within 3 months.   On track     [place] coach resource.  Contact your RN case manager for Health Coach referral if interested.        .  COMPLETED: Client understands the importance of follow-up with providers by attending scheduled visits        Caregiver/ spouse state client follows up with his providers regularly and as recommended.     .  Client will report a decrease in swelling/ discomfort in lower legs in 3 months.        Continue to elevate legs when sitting and wear compression hose as advised by doctor Contact your doctor if symptoms worsen.     .  Client will verbalize knowledge of self management of Hypertension as evidences by BP reading of 140/90 or less; or as defined by provider        RN case manager will send client education article: High blood pressure in adults.  Continue to take your medications as prescribed.  Follow up with your  doctor as recommended.  Call your doctor if you have questions.       [name]  HEMOGLOBIN A1C < 7        Discussed diabetes self management actions with caregiver Continue to follow  diabetes self management actions:    Continue Glucose monitoring per provider recommendation  Visit provider every 3-6 months as directed  Hbg A1C level every 3-6 months.  Continue to adhere to a Carbohydrate controlled meal plan  Continue to take your diabetes medication as prescribed by provider ( contact provider for questions/ concerns)  Continue to be Physical activity as tolerated    .  Maintain timely  refills of diabetic medication as prescribed within the year .   On track     Caregiver/ spouse states client maintains timely refills of his medication. Continue to take your medication as prescribed.  Contact your RN case manager [phone]) if you are unable to obtain your medications Call your doctor for questions/ concerns.  Contact your pharmacy to confirm receipt of Senna ( Senokot) prescription.     .  Obtain Annual Foot Exam   On track     Not able to determine foot exam Discussed diabetes foot care - Check feet daily at home (look for skin color changes, cuts, sores or cracks in the skin, swelling of feet or ankles, ingrown or fungal toenails, corn or calluses). Report these findings to your doctor - Wash feet with soap and water, dry feet well especially between toes - Moisturize your feet but not between the toes - Always wear shoes that protect your whole feet.       .  Obtain annual screen for micro albuminuria (urine) , nephropathy (kidney problems)   On track     Urine for protein test completed on 02/21/19  It is importatnt for your doctor to check your urine at least once a year. Discuss this with your doctor at your next visit.     .  Obtain Hemoglobin A1C at least 2 times per year   On track     Hgb A1C test completed on  09/19/19.  Have your A1c checked every 6 months if you are at goal or every 3 months if you are not at goal.       .  Remain free of Covid 19 infection in 2021 (pt-stated)   On track     Wife reports client has completed his 2 series covid shot. RN case manager will send client education article: NATFT 73- after you have been vaccinated.     .  Visit Primary Care Provider or Endocrinologist at least 2 times per year    On track     Client saw primary care provider on 11/04/19 and 12/12/19 and 02/10/20 RN Case manager contacted primary care provider for referral to endocrinologist for client.   Continue to see your primary care provider as recommended.          Plan:  Send successful outreach letter with a copy of their individualized care plan, Send individual care plan to provider and Send educational material  Chronic care management coordinator will outreach in:  3 Months   Quinn Plowman RN,BSN,CCM [facility] [phone]    .

## 2020-04-13 DIAGNOSIS — Z794 Long term (current) use of insulin: Secondary | ICD-10-CM | POA: Diagnosis not present

## 2020-04-13 DIAGNOSIS — E785 Hyperlipidemia, unspecified: Secondary | ICD-10-CM | POA: Diagnosis not present

## 2020-04-13 DIAGNOSIS — Z6829 Body mass index (BMI) 29.0-29.9, adult: Secondary | ICD-10-CM | POA: Diagnosis not present

## 2020-04-13 DIAGNOSIS — I2510 Atherosclerotic heart disease of native coronary artery without angina pectoris: Secondary | ICD-10-CM | POA: Diagnosis not present

## 2020-04-13 DIAGNOSIS — E1165 Type 2 diabetes mellitus with hyperglycemia: Secondary | ICD-10-CM | POA: Diagnosis not present

## 2020-04-13 DIAGNOSIS — I10 Essential (primary) hypertension: Secondary | ICD-10-CM | POA: Diagnosis not present

## 2020-04-14 ENCOUNTER — Other Ambulatory Visit: Payer: Self-pay | Admitting: Cardiology

## 2020-04-20 ENCOUNTER — Other Ambulatory Visit: Payer: Self-pay

## 2020-04-20 ENCOUNTER — Encounter: Payer: Self-pay | Admitting: Cardiology

## 2020-04-20 ENCOUNTER — Ambulatory Visit: Payer: HMO | Admitting: Cardiology

## 2020-04-20 VITALS — BP 128/72 | HR 60 | Resp 16 | Ht 68.5 in | Wt 200.6 lb

## 2020-04-20 DIAGNOSIS — Z95828 Presence of other vascular implants and grafts: Secondary | ICD-10-CM

## 2020-04-20 DIAGNOSIS — R0609 Other forms of dyspnea: Secondary | ICD-10-CM

## 2020-04-20 DIAGNOSIS — Z794 Long term (current) use of insulin: Secondary | ICD-10-CM

## 2020-04-20 DIAGNOSIS — I825Y3 Chronic embolism and thrombosis of unspecified deep veins of proximal lower extremity, bilateral: Secondary | ICD-10-CM | POA: Diagnosis not present

## 2020-04-20 DIAGNOSIS — E7800 Pure hypercholesterolemia, unspecified: Secondary | ICD-10-CM | POA: Diagnosis not present

## 2020-04-20 DIAGNOSIS — Z712 Person consulting for explanation of examination or test findings: Secondary | ICD-10-CM

## 2020-04-20 DIAGNOSIS — R0600 Dyspnea, unspecified: Secondary | ICD-10-CM

## 2020-04-20 DIAGNOSIS — I2699 Other pulmonary embolism without acute cor pulmonale: Secondary | ICD-10-CM

## 2020-04-20 DIAGNOSIS — I2510 Atherosclerotic heart disease of native coronary artery without angina pectoris: Secondary | ICD-10-CM | POA: Diagnosis not present

## 2020-04-20 DIAGNOSIS — I10 Essential (primary) hypertension: Secondary | ICD-10-CM | POA: Diagnosis not present

## 2020-04-20 MED ORDER — ATORVASTATIN CALCIUM 40 MG PO TABS: 40.0000 mg | ORAL_TABLET | Freq: Every evening | ORAL | 1 refills | Status: DC

## 2020-04-20 NOTE — Progress Notes (Signed)
Matthew Zimmerman Date of Birth: 1945-10-30 MRN: [id number] Primary Care Provider:Kim, Jeneen Rinks, MD Former Cardiology Providers: Jeri Lager, APRN, FNP-C Primary Cardiologist: Rex Kras, DO, [facility] (established care 03/09/2020)   Date: 04/20/2020 Last Office Visit: 03/09/2020  Chief Complaint  Patient presents with  . Hypertension  . Results  . Follow-up    6 week    HPI  Matthew Zimmerman is a 74 y.o.  male who presents to the office with a chief complaint of " hypertension and hyperlipidemia follow-up." Patient's past medical history and cardiovascular risk factors include: Hypertension, type 2 diabetes mellitus with complications, hyperlipidemia, former smoker, history of DVT/PE and status post IVC filter on oral anticoagulation, coronary artery calcification by CT scan, advanced age.  Patient is accompanied by his wife at today's office visit.  Patient was under the care of Jeri Lager, APRN, FNP-C and reestablished care with myself back in July 2021.    In the recent past patient has been hospitalized several times due to deep venous thrombosis in bilateral lower extremities.  He is also had an episode of gastrointestinal bleeding.  He is also undergone IVC filter placement.  Patient states that he is back on his oral anticoagulation and tolerating medication well without any endorsement of bleeding.  Since the recent hospitalizations patient states that his overall functional status has reduced.  He walks with either a cane or walker.  He tries to avoid driving.  At the last office visit patient denied any chest pain at rest or with effort related activities.  But he was experiencing effort related dyspnea with day-to-day activities such as going up to his front driveway to get the mail and walking back.  He is noted to have coronary artery calcification on prior CT studies which were nongated and since then has been on aspirin and statin therapy.  Given his symptoms and his  cardiovascular risk factors he was recommended to undergo stress test.  Reviewed the results of the stress test with the patient and his wife at today's office visit findings noted below for further reference.    Overall the stress test was reported to be low risk study.  Patient states that the symptoms have remained stable improving.  Hypertension:  Patient's office blood pressures is well controlled.  He does not check his blood pressures at home on a regular basis.  They do try to consume a low-salt diet.  Medications reconciled.    Hyperlipidemia: At last office visit patient was recommended to have a repeat fasting lipid profile.  Most recent lipid profile from July 2021 reviewed with the patient and his wife at today's visit.  LDL currently not at goal.   History of deep venous thrombosis, pulmonary embolism, coronary artery calcification. Denies prior history of myocardial infarction, congestive heart failure,  stroke, transient ischemic attack.  FUNCTIONAL STATUS: Limited after his recent DVT/PE episode. But tries to do treadmill atleast 42min per day.    ALLERGIES: Allergies  Allergen Reactions  . Cortisone Other (See Comments)    CBG thrown out of control     MEDICATION LIST PRIOR TO VISIT: Current Outpatient Medications on File Prior to Visit  Medication Sig Dispense Refill  . apixaban (ELIQUIS) 5 MG TABS tablet Take 1 tablet (5 mg total) by mouth 2 (two) times daily. 60 tablet 1  . aspirin EC 81 MG tablet Take 81 mg by mouth every evening.     [name] augmented betamethasone dipropionate (DIPROLENE-AF) 0.05 % cream Apply 1 application topically daily  as needed (itchy skin near bottom).     . BD INSULIN SYRINGE U/F 31G X 5/16" 0.5 ML MISC 2 (two) times daily.    . clotrimazole-betamethasone (LOTRISONE) cream Apply 1 application topically 2 (two) times daily as needed (itching).     . dapagliflozin propanediol (FARXIGA) 5 MG TABS tablet Take 5 mg by mouth daily.    . diclofenac  sodium (VOLTAREN) 1 % GEL Apply 1 application topically 4 (four) times daily as needed (pain).    . furosemide (LASIX) 80 MG tablet Take 80 mg by mouth daily.    [name] glucose blood (ONE TOUCH ULTRA TEST) test strip Use to test blood sugar twice a day    . insulin NPH-regular Human (NOVOLIN 70/30) (70-30) 100 UNIT/ML injection Inject 25-35 Units into the skin 2 (two) times daily with a meal. Humulin    . Lancets (ONETOUCH DELICA PLUS [id number]) MISC Use to test blood sugar twice daily    . lidocaine (XYLOCAINE) 2 % jelly Apply 1 application topically as needed. 85 g 0  . metoprolol succinate (TOPROL-XL) 25 MG 24 hr tablet Take 1 tablet (25 mg total) by mouth daily. Take with or immediately following a meal. 90 tablet 0  . senna (SENOKOT) 8.6 MG TABS tablet Take 2 tablets (17.2 mg total) by mouth at bedtime. 120 tablet 0  . vitamin B-12 (CYANOCOBALAMIN) 500 MCG tablet Take 500 mcg by mouth daily.     . Vitamin D, Ergocalciferol, (DRISDOL) 1.25 MG (50000 UNIT) CAPS capsule Take 50,000 Units by mouth every 7 (seven) days.     No current facility-administered medications on file prior to visit.    PAST MEDICAL HISTORY: Past Medical History:  Diagnosis Date  . Carpal tunnel syndrome on both sides   . Chest pain   . Coronary artery calcification seen on CAT scan   . Diabetes mellitus without complication ([place])   . DVT (deep venous thrombosis) ([place])   . GERD (gastroesophageal reflux disease)   . Headache   . Hypercholesteremia   . Hyperlipidemia   . Hypertension   . PE (pulmonary embolism)   . Shortness of breath dyspnea    PE     PAST SURGICAL HISTORY: Past Surgical History:  Procedure Laterality Date  . BILATERAL CARPAL TUNNEL RELEASE Bilateral   . CATARACT EXTRACTION W/PHACO Left 08/05/2006   Dr. Gershon Crane  . CATARACT EXTRACTION W/PHACO Right 08/18/2006   Dr. Gershon Crane  . COLONOSCOPY    . COLONOSCOPY WITH PROPOFOL Left 09/20/2019   Procedure: COLONOSCOPY WITH PROPOFOL;  Surgeon: Ronnette Juniper, MD;  Location: [facility];  Service: Gastroenterology;  Laterality: Left;  . COLONOSCOPY WITH PROPOFOL N/A 10/03/2019   Procedure: COLONOSCOPY WITH PROPOFOL;  Surgeon: Wilford Corner, MD;  Location: [facility];  Service: Endoscopy;  Laterality: N/A;  . ELBOW SURGERY     BONE SPURS REMOVED B ELBOWS  . EYE SURGERY Bilateral    cataracts  . FOOT SURGERY     HEEL SPURS REMOVED B HEELS  . HEMOSTASIS CLIP PLACEMENT  09/20/2019   Procedure: HEMOSTASIS CLIP PLACEMENT;  Surgeon: Ronnette Juniper, MD;  Location: [facility];  Service: Gastroenterology;;  . HEMOSTASIS CLIP PLACEMENT  10/03/2019   Procedure: HEMOSTASIS CLIP PLACEMENT;  Surgeon: Wilford Corner, MD;  Location: [facility];  Service: Endoscopy;;  . HOT HEMOSTASIS N/A 09/20/2019   Procedure: HOT HEMOSTASIS (ARGON PLASMA COAGULATION/BICAP);  Surgeon: Ronnette Juniper, MD;  Location: [facility];  Service: Gastroenterology;  Laterality: N/A;  . I & D EXTREMITY Right 09/18/2014  Procedure: IRRIGATION AND DEBRIDEMENT RIGHT THUMB, open fracture repair;  Surgeon: Linna Hoff, MD;  Location: [facility];  Service: Orthopedics;  Laterality: Right;  . IR IVC FILTER PLMT / S&I Burke Keels GUID/MOD SED  10/03/2019  . LEG SURGERY Left    left thigh table saw accident, "cut to the bone", I&D/suturing  . SHOULDER SURGERY Left    Bone spur  . SPINE SURGERY  09/04/1985   lumbar spine  . SUBMUCOSAL INJECTION  10/03/2019   Procedure: SUBMUCOSAL INJECTION;  Surgeon: Wilford Corner, MD;  Location: [facility];  Service: Endoscopy;;  . TOE SURGERY      FAMILY HISTORY: The patient's family history includes Cancer in his mother; Diabetes in his father.   SOCIAL HISTORY:  The patient  reports that he quit smoking about 57 years ago. His smoking use included cigarettes. He smoked 0.50 packs per day. He has quit using smokeless tobacco. He reports that he does not drink alcohol and does not use drugs.  Review of Systems  Constitutional: Negative for chills and  fever.  HENT: Negative for hoarse voice and nosebleeds.   Eyes: Negative for discharge, double vision and pain.  Cardiovascular: Positive for dyspnea on exertion. Negative for chest pain, claudication, leg swelling, near-syncope, orthopnea, palpitations, paroxysmal nocturnal dyspnea and syncope.  Respiratory: Negative for hemoptysis and shortness of breath.   Musculoskeletal: Negative for muscle cramps and myalgias.  Gastrointestinal: Negative for abdominal pain, constipation, diarrhea, hematemesis, hematochezia, melena, nausea and vomiting.  Neurological: Negative for dizziness and light-headedness.    PHYSICAL EXAM: Vitals with BMI 04/20/2020 03/09/2020 12/30/2019  Height 5' 8.5" 5' 8.5" 5\' 8"   Weight 200 lbs 10 oz 201 lbs 204 lbs  BMI 30.05 34.19 37.90  Systolic 240 973 532  Diastolic 72 80 82  Pulse 60 54 58   CONSTITUTIONAL: Well-developed and well-nourished. No acute distress.  SKIN: Skin is warm and dry. No rash noted. No cyanosis. No pallor. No jaundice HEAD: Normocephalic and atraumatic.  EYES: No scleral icterus MOUTH/THROAT: Moist oral membranes.  NECK: No JVD present. No thyromegaly noted. No carotid bruits  LYMPHATIC: No visible cervical adenopathy.  CHEST Normal respiratory effort. No intercostal retractions  LUNGS: Clear to auscultation bilaterally.  No stridor. No wheezes. No rales.  CARDIOVASCULAR: Regular rate and rhythm, positive S1-S2, no murmurs rubs or gallops appreciated. ABDOMINAL: Obese, soft, nontender, nondistended, positive bowel sounds in all 4 quadrants.  No apparent ascites.  EXTREMITIES: No peripheral edema.  2+ bilateral femoral pulses, 1+ bilateral popliteal and dorsalis pedis pulses. HEMATOLOGIC: No significant bruising NEUROLOGIC: Oriented to person, place, and time. Nonfocal. Normal muscle tone.  PSYCHIATRIC: Normal mood and affect. Normal behavior. Cooperative  RADIOLOGY: CT of abdomen 01/16/2018:   Aortic atherosclerosis, in addition to least 2  vessel coronary artery disease.   There are calcifications of the aortic valve.   CTA of chest 02/25/2019: Small pulmonary embolus in right lower lobe pulmonary arterial branches.   CARDIAC DATABASE: EKG: 04/09/2019: Sinus bradycardia at 56 bpm, normal axis, RBBB. 1 PVC. Abnormal EKG  Echocardiogram: 05/05/2019: LVEF 55%, mild LVH, grade 1 diastolic impairment, no significant valvular heart disease.  Stress Testing:  Lexiscan (Walking with mod Bruce)Tetrofosmin Stress Test  03/23/2020: Nondiagnostic ECG stress. Resting EKG/ECG demonstrated normal sinus rhythm. Left QRS axis deviation in the presence of right bundle branch block. Non-specific ST-T abnormality. Resting ECG revealed premature ventricular contractions. PVC and V- Bigeminy persisted throughout the lexiscan stress with mod-Bruce protocol. No additional ST-T changes from baseline.  There was  diaphragmatic attenuation in inferior wall, otherwise myocardial perfusion is normal. Overall LV systolic function is normal without regional wall motion abnormalities. There is a fixed mild defect in the inferior region.  Stress LV EF: 60%.  No previous exam available for comparison. Low risk.   Heart Catheterization: None  IVC/Iliac Study 10/14/2019: Abdominal Aorta: There is evidence of abnormal dilatation of the distal Abdominal aorta. IVC/Iliac: There is evidence of acute thrombus involving the IVC. There is evidence of acute thrombus involving the right common iliac vein, left common iliac vein, right external iliac vein, left external iliac vein. Visualization of proximal Inferior Vena Cava was limited.  US Venous duplex 10/14/2019: RIGHT:  - Findings consistent with acute deep vein thrombosis involving the right common femoral vein, SF junction, right femoral vein, right proximal  profunda vein, right popliteal vein, right posterior tibial veins, right peroneal veins, and right gastrocnemius veins.  - No cystic structure found in  the popliteal fossa.    LEFT:  - Findings consistent with acute deep vein thrombosis involving the left common femoral vein, SF junction, left femoral vein, left proximal profunda vein, left popliteal vein, and left gastrocnemius veins.  - No cystic structure found in the popliteal fossa.   LABORATORY DATA: CBC Latest Ref Rng & Units 10/28/2019 10/17/2019 10/17/2019  WBC 4.0 - 10.5 K/uL 11.7(H) 10.9(H) 11.6(H)  Hemoglobin 13.0 - 17.0 g/dL 11.0(L) 9.9(L) 9.5(L)  Hematocrit 39 - 52 % 36.6(L) 31.2(L) 30.6(L)  Platelets 150 - 400 K/uL 441(H) 310 271    CMP Latest Ref Rng & Units 10/28/2019 10/16/2019 10/15/2019  Glucose 70 - 99 mg/dL 272(H) 212(H) 149(H)  BUN 8 - 23 mg/dL 15 30(H) 40(H)  Creatinine 0.61 - 1.24 mg/dL 1.15 1.27(H) 1.71(H)  Sodium 135 - 145 mmol/L 139 134(L) 138  Potassium 3.5 - 5.1 mmol/L 4.3 4.1 4.1  Chloride 98 - 111 mmol/L 108 108 112(H)  CO2 22 - 32 mmol/L 16(L) 17(L) 17(L)  Calcium 8.9 - 10.3 mg/dL 9.4 8.3(L) 8.3(L)  Total Protein 6.5 - 8.1 g/dL 7.4 6.6 -  Total Bilirubin 0.3 - 1.2 mg/dL 1.0 1.2 -  Alkaline Phos 38 - 126 U/L 79 56 -  AST 15 - 41 U/L 28 11(L) -  ALT 0 - 44 U/L 26 12 -    Lipid Panel     Component Value Date/Time   CHOL 149 03/10/2020 1127   TRIG 94 03/10/2020 1127   HDL 51 03/10/2020 1127   LDLCALC 80 03/10/2020 1127   LABVLDL 18 03/10/2020 1127    Lab Results  Component Value Date   HGBA1C 7.5 (H) 09/19/2019   HGBA1C 7.2 (H) 05/20/2014   No components found for: NTPROBNP Lab Results  Component Value Date   TSH 6.373 (H) 10/13/2019   TSH 1.36 05/12/2010   IMPRESSION:    ICD-10-CM   1. Dyspnea on exertion  R06.00   2. Essential hypertension  I10 EKG 12-Lead  3. Hypercholesterolemia  E78.00 atorvastatin (LIPITOR) 40 MG tablet    Lipid Panel With LDL/HDL Ratio    Comprehensive Metabolic Panel (CMET)  4. Coronary artery calcification seen on CT scan  I25.10   5. Long-term insulin use (HCC)  Z79.4   6. Pulmonary embolism on right  (HCC)  I26.99   7. Chronic deep vein thrombosis (DVT) of proximal vein of both lower extremities (HCC)  I82.5Y3   8. Presence of IVC filter  Z95.828   9. Encounter to discuss test results  Z71.2  RECOMMENDATIONS: Danish Ruffins is a 74 y.o. male whose past medical history and cardiovascular risk factors include: Hypertension, type 2 diabetes mellitus with complications, hyperlipidemia, former smoker, history of DVT/PE and status post IVC filter on oral anticoagulation, coronary artery calcification by CT scan, advanced age.  Dyspnea on exertion: Chronic and stable.  Given his multiple cardiovascular risk factors he was recommended to undergo nuclear stress test to evaluate for reversible ischemia at the last office visit.  Since last visit his symptoms have overall remained stable.  Reviewed the nuclear stress test results with the patient and his wife at today's visit.  Overall low risk study.    Patient is also asked to discuss this further with his primary care physician as he may have underlying pulmonary etiologies of his shortness of breath with effort related activities given his history of PE and former smoking.  Benign essential hypertension with chronic kidney disease stage II . Office blood pressure is at goal.  . Medication reconciled.  . Low salt diet recommended. A diet that is rich in fruits, vegetables, legumes, and low-fat dairy products and low in snacks, sweets, and meats (such as the Dietary Approaches to Stop Hypertension [DASH] diet).   Hyperlipidemia, mixed:  Most recent lipid profile from July 2021 reviewed with the patient.  Since he is a diabetic would recommend an LDL less than 70 mg/dL.  We will increase Lipitor from 20 mg p.o. nightly to 40 mg p.o. nightly.  We will repeat a fasting lipid profile and CMP prior to the next office visit on or after October 21, 2020.    Coronary artery calcification: Continue aspirin 81 mg p.o. daily and statin  therapy.  See above.  Insulin-dependent diabetes mellitus type 2 with complications of retinopathy and neuropathy: Patient educated on importance of glycemic control given his multiple cardiovascular risk factors.   FINAL MEDICATION LIST END OF ENCOUNTER: Meds ordered this encounter  Medications  . atorvastatin (LIPITOR) 40 MG tablet    Sig: Take 1 tablet (40 mg total) by mouth at bedtime.    Dispense:  90 tablet    Refill:  1    Medications Discontinued During This Encounter  Medication Reason  . atorvastatin (LIPITOR) 20 MG tablet Dose change     Current Outpatient Medications:  .  apixaban (ELIQUIS) 5 MG TABS tablet, Take 1 tablet (5 mg total) by mouth 2 (two) times daily., Disp: 60 tablet, Rfl: 1 .  aspirin EC 81 MG tablet, Take 81 mg by mouth every evening. , Disp: , Rfl:  .  augmented betamethasone dipropionate (DIPROLENE-AF) 0.05 % cream, Apply 1 application topically daily as needed (itchy skin near bottom). , Disp: , Rfl:  .  BD INSULIN SYRINGE U/F 31G X 5/16" 0.5 ML MISC, 2 (two) times daily., Disp: , Rfl:  .  clotrimazole-betamethasone (LOTRISONE) cream, Apply 1 application topically 2 (two) times daily as needed (itching). , Disp: , Rfl:  .  dapagliflozin propanediol (FARXIGA) 5 MG TABS tablet, Take 5 mg by mouth daily., Disp: , Rfl:  .  diclofenac sodium (VOLTAREN) 1 % GEL, Apply 1 application topically 4 (four) times daily as needed (pain)., Disp: , Rfl:  .  furosemide (LASIX) 80 MG tablet, Take 80 mg by mouth daily., Disp: , Rfl:  .  glucose blood (ONE TOUCH ULTRA TEST) test strip, Use to test blood sugar twice a day, Disp: , Rfl:  .  insulin NPH-regular Human (NOVOLIN 70/30) (70-30) 100 UNIT/ML injection, Inject 25-35 Units into the  skin 2 (two) times daily with a meal. Humulin, Disp: , Rfl:  .  Lancets (ONETOUCH DELICA PLUS [id number]) MISC, Use to test blood sugar twice daily, Disp: , Rfl:  .  lidocaine (XYLOCAINE) 2 % jelly, Apply 1 application topically as needed.,  Disp: 85 g, Rfl: 0 .  metoprolol succinate (TOPROL-XL) 25 MG 24 hr tablet, Take 1 tablet (25 mg total) by mouth daily. Take with or immediately following a meal., Disp: 90 tablet, Rfl: 0 .  senna (SENOKOT) 8.6 MG TABS tablet, Take 2 tablets (17.2 mg total) by mouth at bedtime., Disp: 120 tablet, Rfl: 0 .  vitamin B-12 (CYANOCOBALAMIN) 500 MCG tablet, Take 500 mcg by mouth daily. , Disp: , Rfl:  .  Vitamin D, Ergocalciferol, (DRISDOL) 1.25 MG (50000 UNIT) CAPS capsule, Take 50,000 Units by mouth every 7 (seven) days., Disp: , Rfl:  .  atorvastatin (LIPITOR) 40 MG tablet, Take 1 tablet (40 mg total) by mouth at bedtime., Disp: 90 tablet, Rfl: 1  Orders Placed This Encounter  Procedures  . Lipid Panel With LDL/HDL Ratio  . Comprehensive Metabolic Panel (CMET)  . EKG 12-Lead   --Continue cardiac medications as reconciled in final medication list. --Return in about 6 months (around 10/29/2020) for re-evaluation of symptoms. HDL follow up. . Or sooner if needed. --Continue follow-up with your primary care physician regarding the management of your other chronic comorbid conditions.  Patient's questions and concerns were addressed to his satisfaction. He voices understanding of the instructions provided during this encounter.   This note was created using a voice recognition software as a result there may be grammatical errors inadvertently enclosed that do not reflect the nature of this encounter. Every attempt is made to correct such errors.  Rex Kras, [place], [facility]  Pager: [phone] Office: [phone]

## 2020-04-20 NOTE — Patient Instructions (Signed)
Fasting blood work on October 21 2020.

## 2020-04-29 DIAGNOSIS — Z794 Long term (current) use of insulin: Secondary | ICD-10-CM | POA: Diagnosis not present

## 2020-04-29 DIAGNOSIS — I10 Essential (primary) hypertension: Secondary | ICD-10-CM | POA: Diagnosis not present

## 2020-04-29 DIAGNOSIS — E785 Hyperlipidemia, unspecified: Secondary | ICD-10-CM | POA: Diagnosis not present

## 2020-04-29 DIAGNOSIS — Z6829 Body mass index (BMI) 29.0-29.9, adult: Secondary | ICD-10-CM | POA: Diagnosis not present

## 2020-04-29 DIAGNOSIS — I2510 Atherosclerotic heart disease of native coronary artery without angina pectoris: Secondary | ICD-10-CM | POA: Diagnosis not present

## 2020-04-29 DIAGNOSIS — E118 Type 2 diabetes mellitus with unspecified complications: Secondary | ICD-10-CM | POA: Diagnosis not present

## 2020-04-29 DIAGNOSIS — E1165 Type 2 diabetes mellitus with hyperglycemia: Secondary | ICD-10-CM | POA: Diagnosis not present

## 2020-05-17 ENCOUNTER — Other Ambulatory Visit: Payer: Self-pay

## 2020-05-17 ENCOUNTER — Encounter (INDEPENDENT_AMBULATORY_CARE_PROVIDER_SITE_OTHER): Payer: Self-pay | Admitting: Ophthalmology

## 2020-05-17 ENCOUNTER — Ambulatory Visit (INDEPENDENT_AMBULATORY_CARE_PROVIDER_SITE_OTHER): Payer: HMO | Admitting: Ophthalmology

## 2020-05-17 DIAGNOSIS — E113412 Type 2 diabetes mellitus with severe nonproliferative diabetic retinopathy with macular edema, left eye: Secondary | ICD-10-CM | POA: Diagnosis not present

## 2020-05-17 DIAGNOSIS — E113411 Type 2 diabetes mellitus with severe nonproliferative diabetic retinopathy with macular edema, right eye: Secondary | ICD-10-CM | POA: Diagnosis not present

## 2020-05-17 NOTE — Progress Notes (Signed)
05/17/2020     CHIEF COMPLAINT Patient presents for Retina Follow Up   HISTORY OF PRESENT ILLNESS: Matthew Zimmerman is a 74 y.o. male who presents to the clinic today for:   HPI    Retina Follow Up    Patient presents with  Diabetic Retinopathy.  In both eyes.  This started 4 months ago.  Severity is mild.  Duration of 4 months.  Since onset it is stable.          Comments    4 Month Diabetic F/U OU  Pt denies noticeable changes to [place] OU since last visit. Pt denies ocular pain, flashes of light, or floaters OU.  A1c: 7.5, 04/2020 LBS: 202 this AM "it has been up and down"       Last edited by Rockie Neighbours, [place] on 05/17/2020 10:38 AM. (History)      Referring physician: Jani Gravel, MD [place],  [place] [id number]  HISTORICAL INFORMATION:   Selected notes from the [facility]    Lab Results  Component Value Date   HGBA1C 7.5 (H) 09/19/2019     CURRENT MEDICATIONS: No current outpatient medications on file. (Ophthalmic Drugs)   No current facility-administered medications for this visit. (Ophthalmic Drugs)   Current Outpatient Medications (Other)  Medication Sig  . apixaban (ELIQUIS) 5 MG TABS tablet Take 1 tablet (5 mg total) by mouth 2 (two) times daily.  [name] aspirin EC 81 MG tablet Take 81 mg by mouth every evening.   [name] atorvastatin (LIPITOR) 40 MG tablet Take 1 tablet (40 mg total) by mouth at bedtime.  [name] augmented betamethasone dipropionate (DIPROLENE-AF) 0.05 % cream Apply 1 application topically daily as needed (itchy skin near bottom).   . BD INSULIN SYRINGE U/F 31G X 5/16" 0.5 ML MISC 2 (two) times daily.  . clotrimazole-betamethasone (LOTRISONE) cream Apply 1 application topically 2 (two) times daily as needed (itching).   . dapagliflozin propanediol (FARXIGA) 5 MG TABS tablet Take 5 mg by mouth daily.  . diclofenac sodium (VOLTAREN) 1 % GEL Apply 1 application topically 4 (four) times daily as needed (pain).  .  furosemide (LASIX) 80 MG tablet Take 80 mg by mouth daily.  [name] glucose blood (ONE TOUCH ULTRA TEST) test strip Use to test blood sugar twice a day  . insulin NPH-regular Human (NOVOLIN 70/30) (70-30) 100 UNIT/ML injection Inject 25-35 Units into the skin 2 (two) times daily with a meal. Humulin  . Lancets (ONETOUCH DELICA PLUS [id number]) MISC Use to test blood sugar twice daily  . lidocaine (XYLOCAINE) 2 % jelly Apply 1 application topically as needed.  . metoprolol succinate (TOPROL-XL) 25 MG 24 hr tablet Take 1 tablet (25 mg total) by mouth daily. Take with or immediately following a meal.  . senna (SENOKOT) 8.6 MG TABS tablet Take 2 tablets (17.2 mg total) by mouth at bedtime.  . vitamin B-12 (CYANOCOBALAMIN) 500 MCG tablet Take 500 mcg by mouth daily.   . Vitamin D, Ergocalciferol, (DRISDOL) 1.25 MG (50000 UNIT) CAPS capsule Take 50,000 Units by mouth every 7 (seven) days.   No current facility-administered medications for this visit. (Other)      REVIEW OF SYSTEMS:    ALLERGIES Allergies  Allergen Reactions  . Cortisone Other (See Comments)    CBG thrown out of control    PAST MEDICAL HISTORY Past Medical History:  Diagnosis Date  . Carpal tunnel syndrome on both sides   . Chest pain   .  Coronary artery calcification seen on CAT scan   . Diabetes mellitus without complication ([place])   . DVT (deep venous thrombosis) ([place])   . GERD (gastroesophageal reflux disease)   . Headache   . Hypercholesteremia   . Hyperlipidemia   . Hypertension   . PE (pulmonary embolism)   . Shortness of breath dyspnea    PE    Past Surgical History:  Procedure Laterality Date  . BILATERAL CARPAL TUNNEL RELEASE Bilateral   . CATARACT EXTRACTION W/PHACO Left 08/05/2006   Dr. Gershon Crane  . CATARACT EXTRACTION W/PHACO Right 08/18/2006   Dr. Gershon Crane  . COLONOSCOPY    . COLONOSCOPY WITH PROPOFOL Left 09/20/2019   Procedure: COLONOSCOPY WITH PROPOFOL;  Surgeon: Ronnette Juniper, MD;  Location: [facility]  ENDOSCOPY;  Service: Gastroenterology;  Laterality: Left;  . COLONOSCOPY WITH PROPOFOL N/A 10/03/2019   Procedure: COLONOSCOPY WITH PROPOFOL;  Surgeon: Wilford Corner, MD;  Location: [facility] ENDOSCOPY;  Service: Endoscopy;  Laterality: N/A;  . ELBOW SURGERY     BONE SPURS REMOVED B ELBOWS  . EYE SURGERY Bilateral    cataracts  . FOOT SURGERY     HEEL SPURS REMOVED B HEELS  . HEMOSTASIS CLIP PLACEMENT  09/20/2019   Procedure: HEMOSTASIS CLIP PLACEMENT;  Surgeon: Ronnette Juniper, MD;  Location: [facility];  Service: Gastroenterology;;  . HEMOSTASIS CLIP PLACEMENT  10/03/2019   Procedure: HEMOSTASIS CLIP PLACEMENT;  Surgeon: Wilford Corner, MD;  Location: [facility] END[facility];  Service: Endoscopy;;  . HOT HEMOSTASIS N/A 09/20/2019   Procedure: HOT HEMOSTASIS (ARGON PLASMA COAGULATION/BICAP);  Surgeon: Ronnette Juniper, MD;  Location: [facility];  Service: Gastroenterology;  Laterality: N/A;  . I & D EXTREMITY Right 09/18/2014   Procedure: IRRIGATION AND DEBRIDEMENT RIGHT THUMB, open fracture repair;  Surgeon: Linna Hoff, MD;  Location: [facility];  Service: Orthopedics;  Laterality: Right;  . IR IVC FILTER PLMT / S&I Burke Keels GUID/MOD SED  10/03/2019  . LEG SURGERY Left    left thigh table saw accident, "cut to the bone", I&D/suturing  . SHOULDER SURGERY Left    Bone spur  . SPINE SURGERY  09/04/1985   lumbar spine  . SUBMUCOSAL INJECTION  10/03/2019   Procedure: SUBMUCOSAL INJECTION;  Surgeon: Wilford Corner, MD;  Location: [facility] END[facility];  Service: Endoscopy;;  . TOE SURGERY      FAMILY HISTORY Family History  Problem Relation Age of Onset  . Cancer Mother   . Diabetes Father     SOCIAL HISTORY Social History   Tobacco Use  . Smoking status: Former Smoker    Packs/day: 0.50    Types: Cigarettes    Quit date: 1964    Years since quitting: 57.7  . Smokeless tobacco: Former Systems developer  . Tobacco comment: smoking since 74 years old  Vaping Use  . Vaping Use: Never used  Substance Use Topics  . Alcohol  use: No  . Drug use: No         OPHTHALMIC EXAM: Base Eye Exam    Visual Acuity (ETDRS)      Right Left   Dist [place] 20/40 +2 20/80 +2   Dist ph  20/30 -2 20/50 +1       Tonometry (Tonopen, 10:40 AM)      Right Left   Pressure 16 19       Pupils      Pupils Dark Light Shape React APD   Right PERRL 3 3 Round Minimal None   Left PERRL 3 3 Round Minimal None  Visual Fields (Counting fingers)      Left Right    Full Full       Extraocular Movement      Right Left    Full Full       Neuro/Psych    Oriented x3: Yes   Mood/Affect: Normal       Dilation    Both eyes: 1.0% Mydriacyl, 2.5% Phenylephrine @ 10:44 AM        Slit Lamp and Fundus Exam    External Exam      Right Left   External Normal Normal       Slit Lamp Exam      Right Left   Lids/Lashes Normal Normal   Conjunctiva/Sclera White and quiet White and quiet   Cornea Clear Clear   Anterior Chamber Deep and quiet Deep and quiet   Iris Round and reactive Round and reactive   Lens Posterior chamber intraocular lens Posterior chamber intraocular lens   Anterior Vitreous Normal Normal       Fundus Exam      Right Left   Posterior Vitreous Posterior vitreous detachment Posterior vitreous detachment   Disc Normal Normal   C/D Ratio 0.35 0.3   Macula Macular thickening, Microaneurysms, Mild clinically significant macular edema Microaneurysms, no macular thickening, Focal laser scars   Vessels Severe NPDR Severe NPDR   Periphery Normal Normal          IMAGING AND PROCEDURES  Imaging and Procedures for 05/17/20  OCT, Retina - OU - Both Eyes       Right Eye Quality was good. Scan locations included subfoveal. Central Foveal Thickness: 302. Progression has improved.   Left Eye Quality was good. Scan locations included subfoveal. Central Foveal Thickness: 247. Progression has improved.   Notes No active CSME in either eye.  Will observe, as overall improved on clinical examination and  on OCT                ASSESSMENT/PLAN:  Severe nonproliferative diabetic retinopathy of right eye, with macular edema, associated with type 2 diabetes mellitus (HCC) Severe NPDR OD will 1 day deliver moderate scatter PRP to slow progression to PDR should the patient become medically ill from other condition preventing evaluation and follow-up in the ambulatory eye department setting  Severe nonproliferative diabetic retinopathy of left eye, with macular edema, associated with type 2 diabetes mellitus ([place]) See comments regarding severe NPDR associated with the right eye today      ICD-10-CM   1. Severe nonproliferative diabetic retinopathy of right eye, with macular edema, associated with type 2 diabetes mellitus (HCC)  E11.3411 OCT, Retina - OU - Both Eyes  2. Severe nonproliferative diabetic retinopathy of left eye, with macular edema, associated with type 2 diabetes mellitus (HCC)  L93.7902 OCT, Retina - OU - Both Eyes    1.  We will continue to monitor the bilateral maculopathy.  2.  May 1 day deliver PRP OD OS to prevent progression to PDR.  3.  Ophthalmic Meds Ordered this visit:  No orders of the defined types were placed in this encounter.      Return in about 6 months (around 11/14/2020) for DILATE OU, OCT.  There are no Patient Instructions on file for this visit.   Explained the diagnoses, plan, and follow up with the patient and they expressed understanding.  Patient expressed understanding of the importance of proper follow up care.   Clent Demark Shawnae Leiva M.D. Diseases & Surgery of the  Retina and Vitreous Retina & Diabetic [place] 05/17/20     Abbreviations: M myopia (nearsighted); A astigmatism; H hyperopia (farsighted); P presbyopia; Mrx spectacle prescription;  CTL contact lenses; OD right eye; OS left eye; OU both eyes  XT exotropia; ET esotropia; PEK punctate epithelial keratitis; PEE punctate epithelial erosions; DES dry eye syndrome; MGD meibomian  gland dysfunction; ATs artificial tears; PFAT's preservative free artificial tears; [place] nuclear sclerotic cataract; PSC posterior subcapsular cataract; ERM epi-retinal membrane; PVD posterior vitreous detachment; RD retinal detachment; DM diabetes mellitus; DR diabetic retinopathy; NPDR non-proliferative diabetic retinopathy; PDR proliferative diabetic retinopathy; CSME clinically significant macular edema; DME diabetic macular edema; dbh dot blot hemorrhages; CWS cotton wool spot; POAG primary open angle glaucoma; C/D cup-to-disc ratio; HVF humphrey visual field; GVF goldmann visual field; OCT optical coherence tomography; IOP intraocular pressure; BRVO Branch retinal vein occlusion; CRVO central retinal vein occlusion; CRAO central retinal artery occlusion; BRAO branch retinal artery occlusion; RT retinal tear; SB scleral buckle; PPV pars plana vitrectomy; VH Vitreous hemorrhage; PRP panretinal laser photocoagulation; IVK intravitreal kenalog; VMT vitreomacular traction; MH Macular hole;  NVD neovascularization of the disc; NVE neovascularization elsewhere; AREDS age related eye disease study; ARMD age related macular degeneration; POAG primary open angle glaucoma; EBMD epithelial/anterior basement membrane dystrophy; ACIOL anterior chamber intraocular lens; IOL intraocular lens; PCIOL posterior chamber intraocular lens; Phaco/IOL phacoemulsification with intraocular lens placement; [place] photorefractive keratectomy; LASIK laser assisted in situ keratomileusis; HTN hypertension; DM diabetes mellitus; COPD chronic obstructive pulmonary disease

## 2020-05-17 NOTE — Assessment & Plan Note (Signed)
See comments regarding severe NPDR associated with the right eye today

## 2020-05-17 NOTE — Assessment & Plan Note (Signed)
Severe NPDR OD will 1 day deliver moderate scatter PRP to slow progression to PDR should the patient become medically ill from other condition preventing evaluation and follow-up in the ambulatory eye department setting

## 2020-06-23 DIAGNOSIS — Z6829 Body mass index (BMI) 29.0-29.9, adult: Secondary | ICD-10-CM | POA: Diagnosis not present

## 2020-06-23 DIAGNOSIS — E118 Type 2 diabetes mellitus with unspecified complications: Secondary | ICD-10-CM | POA: Diagnosis not present

## 2020-06-23 DIAGNOSIS — I10 Essential (primary) hypertension: Secondary | ICD-10-CM | POA: Diagnosis not present

## 2020-06-23 DIAGNOSIS — E785 Hyperlipidemia, unspecified: Secondary | ICD-10-CM | POA: Diagnosis not present

## 2020-06-23 DIAGNOSIS — E1165 Type 2 diabetes mellitus with hyperglycemia: Secondary | ICD-10-CM | POA: Diagnosis not present

## 2020-06-23 DIAGNOSIS — I2510 Atherosclerotic heart disease of native coronary artery without angina pectoris: Secondary | ICD-10-CM | POA: Diagnosis not present

## 2020-06-23 DIAGNOSIS — E539 Vitamin B deficiency, unspecified: Secondary | ICD-10-CM | POA: Diagnosis not present

## 2020-06-23 DIAGNOSIS — Z794 Long term (current) use of insulin: Secondary | ICD-10-CM | POA: Diagnosis not present

## 2020-06-24 ENCOUNTER — Ambulatory Visit: Payer: Self-pay

## 2020-06-30 ENCOUNTER — Other Ambulatory Visit: Payer: Self-pay

## 2020-06-30 DIAGNOSIS — N182 Chronic kidney disease, stage 2 (mild): Secondary | ICD-10-CM | POA: Diagnosis not present

## 2020-06-30 DIAGNOSIS — E1140 Type 2 diabetes mellitus with diabetic neuropathy, unspecified: Secondary | ICD-10-CM | POA: Diagnosis not present

## 2020-06-30 DIAGNOSIS — M5136 Other intervertebral disc degeneration, lumbar region: Secondary | ICD-10-CM | POA: Diagnosis not present

## 2020-06-30 DIAGNOSIS — I10 Essential (primary) hypertension: Secondary | ICD-10-CM | POA: Diagnosis not present

## 2020-06-30 DIAGNOSIS — M5134 Other intervertebral disc degeneration, thoracic region: Secondary | ICD-10-CM | POA: Diagnosis not present

## 2020-06-30 DIAGNOSIS — E1165 Type 2 diabetes mellitus with hyperglycemia: Secondary | ICD-10-CM | POA: Diagnosis not present

## 2020-06-30 DIAGNOSIS — I2510 Atherosclerotic heart disease of native coronary artery without angina pectoris: Secondary | ICD-10-CM | POA: Diagnosis not present

## 2020-06-30 DIAGNOSIS — E785 Hyperlipidemia, unspecified: Secondary | ICD-10-CM | POA: Diagnosis not present

## 2020-06-30 DIAGNOSIS — Z23 Encounter for immunization: Secondary | ICD-10-CM | POA: Diagnosis not present

## 2020-06-30 DIAGNOSIS — D649 Anemia, unspecified: Secondary | ICD-10-CM | POA: Diagnosis not present

## 2020-06-30 DIAGNOSIS — I2699 Other pulmonary embolism without acute cor pulmonale: Secondary | ICD-10-CM | POA: Diagnosis not present

## 2020-06-30 DIAGNOSIS — R001 Bradycardia, unspecified: Secondary | ICD-10-CM | POA: Diagnosis not present

## 2020-06-30 NOTE — Patient Outreach (Signed)
  [facility]) Care Management Chronic Special Needs Program    06/30/2020  Name: Matthew Zimmerman, DOB: [birth year]  MRN: [id number]   [name] is enrolled in a chronic special needs plan for Diabetes. Telephone call to client for CSNP assessment update.  Unable to reach. HIPAA compliant voice message left with call back phone number and return call request.  PLAN;;RNCM will attempt 2nd telephone call to client in 1 week   Quinn Plowman RN,BSN,CCM [facility] [phone]

## 2020-07-01 ENCOUNTER — Other Ambulatory Visit: Payer: Self-pay

## 2020-07-01 NOTE — Patient Outreach (Signed)
  [facility]) Care Management Chronic Special Needs Program    07/01/2020  Name: Matthew Zimmerman, DOB: 25-Oct-1945  MRN: [id number]   [name] is enrolled in a chronic special needs plan for Diabetes. Second telephone call to client for CSNP assessment update. Unable to reach. HIPAA compliant voice message left with call back phone number and return call request.   PLAN; RNCM will attempt 3rd telephone outreach to client in 1 week.   Quinn Plowman RN,BSN,CCM [facility] [phone]

## 2020-07-02 ENCOUNTER — Ambulatory Visit: Payer: Self-pay

## 2020-07-07 ENCOUNTER — Other Ambulatory Visit: Payer: Self-pay

## 2020-07-07 NOTE — Patient Outreach (Signed)
[facility]) Care Management Chronic Special Needs Program  07/07/2020  Name: Matthew Zimmerman DOB: 1946/07/04  MRN: [id number]  [name] is enrolled in a chronic special needs plan for Diabetes. Telephone call to client for CSNP assessment update. Unable to reach. HIPAA compliant voice message left with call back phone number and return call request.  Individualized care plan updated based on available data.   Goals Addressed              This Visit's Progress   .   Acknowledge receipt of Advanced Directive package   On track     Advanced Directive packet was re-mailed to client on 04/06/20    .  "Keep my A1C under 7.0%" (pt-stated)   On track     Your last  Hgb A1C on 06/23/20 was 9.0% Continue to follow diabetes self management actions:  Glucose monitoring per provider recommendation  Check feet daily  Visit provider every 3-6 months as directed  Hbg A1C level every 3-6 months.  Eye Exam yearly  Carbohydrate controlled meal planning  Taking diabetes medication as prescribed by provider  Physical activity        .  COMPLETED: caregiver will report client has received a referral to endocrinologist in 3 months   On track     Chart review indicates client had a doctors visit with the endocrinologist on 04/29/20.     .  Caregiver will verbalize understanding of diabetic diet food options/ choices within 3 months.   On track     Contact your RN case manager for Health Coach referral if interested.  RN case manager will send client education article: Diabetes and diet. Please review education articles previously sent to you in the mail on Guide to eating when you have diabetes and Diabetic meal planning.         .  Client and/ or cargiver will verbalize blood sugars within goal of 80-130 fasting and/ or 180 or less postprandial within 3 months.   On track     Chart review indicates client had a doctor visit with an endocrinologist on  04/09/20 Take your diabetic medications as prescribed.  Contact your doctor if you have questions.      .  Client will report a decrease in swelling/ discomfort in lower legs in 3 months.   On track     Continue to follow recommendation:  Elevate legs when sitting and wear compression hose as advised by doctor Contact your doctor if symptoms worsen.     .  Client will verbalize knowledge of self management of Hypertension as evidences by BP reading of 140/90 or less; or as defined by provider   On track     RN case manager will send client education article: High blood pressure Ed. Continue to take your medications as prescribed.  Follow up with your doctor as recommended.  Call your doctor if you have questions.  Follow a low salt diet.  RN case manager will send client education article: Low salt diet.       [name]  HEMOGLOBIN A1C < 7        Continue to follow below recommendations.  diabetes self management actions:    Continue Glucose monitoring per provider recommendation  Visit provider every 3-6 months as directed  Hbg A1C level every 3-6 months.  Continue to adhere to a Carbohydrate controlled meal plan  Continue to take your diabetes medication as prescribed by provider ( contact  provider for questions/ concerns)  Continue to be Physical activity as tolerated    .  Maintain timely refills of diabetic medication as prescribed within the year .   On track     Contact your RN case manager [phone]) if you are unable to obtain your medications Call your doctor for questions/ concerns.       .  Obtain Annual Foot Exam   On track     Not able to determine foot exam It is important to have a yearly diabetic foot exam. Please discuss this with your doctor at your next office visit.   Continue to follow below recommendations:  Discussed diabetes foot care - Check feet daily at home (look for skin color changes, cuts, sores or cracks in the skin, swelling of feet or  ankles, ingrown or fungal toenails, corn or calluses). Report these findings to your doctor - Wash feet with soap and water, dry feet well especially between toes - Moisturize your feet but not between the toes - Always wear shoes that protect your whole feet.       .  Obtain annual screen for micro albuminuria (urine) , nephropathy (kidney problems)   On track     Urine for protein test completed on 02/21/19 RN case manager will send client education article: Urine Albumin    .  COMPLETED: Obtain Hemoglobin A1C at least 2 times per year   On track     Hgb A1C test completed on  09/19/19.,  03/19/20 and 06/23/20       .  Remain free of Covid 19 infection in 2021 (pt-stated)   On track     Wife reports client has completed his 2 series covid shot.     .  Visit Primary Care Provider or Endocrinologist at least 2 times per year    On track     Client saw primary care provider on 11/04/19 and 12/12/19,  02/10/20 and 03/30/20 Endocrinologist visit 04/29/20         Plan:  Send successful outreach letter with a copy of their individualized care plan, Send individual care plan to provider and Send educational material  Chronic care management coordinator will outreach in:  3 Months    Quinn Plowman RN,BSN,CCM [facility] [phone]     .

## 2020-07-15 DIAGNOSIS — M79604 Pain in right leg: Secondary | ICD-10-CM | POA: Diagnosis not present

## 2020-07-20 DIAGNOSIS — B9689 Other specified bacterial agents as the cause of diseases classified elsewhere: Secondary | ICD-10-CM | POA: Diagnosis not present

## 2020-07-20 DIAGNOSIS — L02229 Furuncle of trunk, unspecified: Secondary | ICD-10-CM | POA: Diagnosis not present

## 2020-07-26 ENCOUNTER — Other Ambulatory Visit: Payer: Self-pay | Admitting: Cardiology

## 2020-08-05 ENCOUNTER — Other Ambulatory Visit: Payer: Self-pay

## 2020-08-05 ENCOUNTER — Observation Stay (HOSPITAL_COMMUNITY)
Admission: EM | Admit: 2020-08-05 | Discharge: 2020-08-06 | Disposition: A | Discharge status: Home / Self Care | Payer: HMO | Attending: Internal Medicine | Admitting: Internal Medicine

## 2020-08-05 ENCOUNTER — Encounter (HOSPITAL_COMMUNITY): Payer: Self-pay

## 2020-08-05 DIAGNOSIS — Z794 Long term (current) use of insulin: Secondary | ICD-10-CM | POA: Insufficient documentation

## 2020-08-05 DIAGNOSIS — Z87891 Personal history of nicotine dependence: Secondary | ICD-10-CM | POA: Diagnosis not present

## 2020-08-05 DIAGNOSIS — Z7901 Long term (current) use of anticoagulants: Secondary | ICD-10-CM | POA: Diagnosis not present

## 2020-08-05 DIAGNOSIS — I10 Essential (primary) hypertension: Secondary | ICD-10-CM | POA: Insufficient documentation

## 2020-08-05 DIAGNOSIS — Z7982 Long term (current) use of aspirin: Secondary | ICD-10-CM | POA: Insufficient documentation

## 2020-08-05 DIAGNOSIS — E1165 Type 2 diabetes mellitus with hyperglycemia: Secondary | ICD-10-CM

## 2020-08-05 DIAGNOSIS — Z79899 Other long term (current) drug therapy: Secondary | ICD-10-CM | POA: Diagnosis not present

## 2020-08-05 DIAGNOSIS — E113411 Type 2 diabetes mellitus with severe nonproliferative diabetic retinopathy with macular edema, right eye: Secondary | ICD-10-CM | POA: Diagnosis not present

## 2020-08-05 DIAGNOSIS — K625 Hemorrhage of anus and rectum: Secondary | ICD-10-CM | POA: Diagnosis present

## 2020-08-05 DIAGNOSIS — D500 Iron deficiency anemia secondary to blood loss (chronic): Secondary | ICD-10-CM | POA: Diagnosis not present

## 2020-08-05 DIAGNOSIS — Z20822 Contact with and (suspected) exposure to covid-19: Secondary | ICD-10-CM | POA: Diagnosis not present

## 2020-08-05 DIAGNOSIS — K922 Gastrointestinal hemorrhage, unspecified: Secondary | ICD-10-CM | POA: Diagnosis present

## 2020-08-05 DIAGNOSIS — K921 Melena: Secondary | ICD-10-CM | POA: Diagnosis not present

## 2020-08-05 LAB — COMPREHENSIVE METABOLIC PANEL
ALT: 31 U/L (ref 0–44)
AST: 24 U/L (ref 15–41)
Albumin: 4.4 g/dL (ref 3.5–5.0)
Alkaline Phosphatase: 89 U/L (ref 38–126)
Anion gap: 11 (ref 5–15)
BUN: 26 mg/dL — ABNORMAL HIGH (ref 8–23)
CO2: 24 mmol/L (ref 22–32)
Calcium: 9.3 mg/dL (ref 8.9–10.3)
Chloride: 107 mmol/L (ref 98–111)
Creatinine, Ser: 1.48 mg/dL — ABNORMAL HIGH (ref 0.61–1.24)
GFR, Estimated: 49 mL/min — ABNORMAL LOW (ref >60)
Glucose, Bld: 127 mg/dL — ABNORMAL HIGH (ref 70–99)
Potassium: 3.5 mmol/L (ref 3.5–5.1)
Sodium: 142 mmol/L (ref 135–145)
Total Bilirubin: 0.8 mg/dL (ref 0.3–1.2)
Total Protein: 7.9 g/dL (ref 6.5–8.1)

## 2020-08-05 LAB — TYPE AND SCREEN
ABO/RH(D): A POS
Antibody Screen: NEGATIVE

## 2020-08-05 LAB — RESP PANEL BY RT-PCR (FLU A&B, COVID) ARPGX2
Influenza A by PCR: NEGATIVE
Influenza B by PCR: NEGATIVE
SARS Coronavirus 2 by RT PCR: NEGATIVE

## 2020-08-05 LAB — CBC
HCT: 44.1 % (ref 39.0–52.0)
Hemoglobin: 14 g/dL (ref 13.0–17.0)
MCH: 28.2 pg (ref 26.0–34.0)
MCHC: 31.7 g/dL (ref 30.0–36.0)
MCV: 88.7 fL (ref 80.0–100.0)
Platelets: 186 10*3/uL (ref 150–400)
RBC: 4.97 MIL/uL (ref 4.22–5.81)
RDW: 14.6 % (ref 11.5–15.5)
WBC: 8.4 10*3/uL (ref 4.0–10.5)
nRBC: 0 % (ref 0.0–0.2)

## 2020-08-05 LAB — GLUCOSE, CAPILLARY
Glucose-Capillary: 226 mg/dL — ABNORMAL HIGH (ref 70–99)
Glucose-Capillary: 157 mg/dL — ABNORMAL HIGH (ref 70–99)

## 2020-08-05 LAB — POC OCCULT BLOOD, ED: Fecal Occult Bld: POSITIVE — AB

## 2020-08-05 LAB — HEMOGLOBIN AND HEMATOCRIT, BLOOD
HCT: 44.5 % (ref 39.0–52.0)
HCT: 43.1 % (ref 39.0–52.0)
HCT: 40.1 % (ref 39.0–52.0)
Hemoglobin: 14 g/dL (ref 13.0–17.0)
Hemoglobin: 13.4 g/dL (ref 13.0–17.0)
Hemoglobin: 12.3 g/dL — ABNORMAL LOW (ref 13.0–17.0)

## 2020-08-05 LAB — HEMOGLOBIN A1C
Hgb A1c MFr Bld: 8.5 % — ABNORMAL HIGH (ref 4.8–5.6)
Mean Plasma Glucose: 197.25 mg/dL

## 2020-08-05 MED ORDER — ATORVASTATIN CALCIUM 40 MG PO TABS
40.0000 mg | ORAL_TABLET | Freq: Every day | ORAL | Status: DC
  Administered 2020-08-05: 22:00:00 40 mg via ORAL
  Filled 2020-08-05: qty 1

## 2020-08-05 MED ORDER — INSULIN NPH (HUMAN) (ISOPHANE) 100 UNIT/ML ~~LOC~~ SUSP
15.0000 [IU] | Freq: Two times a day (BID) | SUBCUTANEOUS | Status: DC
  Administered 2020-08-05 – 2020-08-06 (×2): 15 [IU] via SUBCUTANEOUS
  Filled 2020-08-05: qty 10

## 2020-08-05 MED ORDER — INSULIN ASPART 100 UNIT/ML ~~LOC~~ SOLN
0.0000 [IU] | Freq: Three times a day (TID) | SUBCUTANEOUS | Status: DC
  Administered 2020-08-05: 17:00:00 5 [IU] via SUBCUTANEOUS
  Administered 2020-08-06: 08:00:00 2 [IU] via SUBCUTANEOUS

## 2020-08-05 MED ORDER — PANTOPRAZOLE SODIUM 40 MG PO TBEC
40.0000 mg | DELAYED_RELEASE_TABLET | Freq: Every day | ORAL | Status: DC
  Administered 2020-08-05 – 2020-08-06 (×2): 40 mg via ORAL
  Filled 2020-08-05 (×2): qty 1

## 2020-08-05 MED ORDER — INSULIN ASPART 100 UNIT/ML ~~LOC~~ SOLN: 0.0000 [IU] | Freq: Every day | SUBCUTANEOUS | Status: DC

## 2020-08-05 MED ORDER — SODIUM CHLORIDE 0.9 % IV SOLN: Freq: Once | INTRAVENOUS | Status: AC

## 2020-08-05 MED ORDER — VITAMIN B-12 1000 MCG PO TABS
500.0000 ug | ORAL_TABLET | Freq: Every day | ORAL | Status: DC
  Administered 2020-08-06: 10:00:00 500 ug via ORAL
  Filled 2020-08-05: qty 1

## 2020-08-05 NOTE — ED Triage Notes (Signed)
Pt reports large amount of bright red blood in stool starting at 2000 last night.

## 2020-08-05 NOTE — ED Provider Notes (Signed)
[facility] DEPT Provider Note   CSN: [id number] Arrival date & time: 08/05/20  0422     History Chief Complaint  Patient presents with  . Rectal Bleeding    Matthew Zimmerman is a 74 y.o. male.  HPI      Matthew Zimmerman is a 74 y.o. male, with a history of DM, DVT, GERD, hyperlipidemia, HTN, PE, anticoagulation, GI bleed, presenting to the ED with bright red blood per rectum beginning last night. Patient endorses stool mixed with bright red blood once at 8 PM last night and another instance at 2 AM this morning.  He has had this issue in the past requiring intervention via colonoscopy.  Colonoscopy and intervention January 2021 showed AVMs in the cecum and transverse colon treated with APC and clips. Performed by Drs. Michail Sermon and Therisa Doyne with [facility]. Patient is on Eliquis for treatment of thrombi involving the IVC and common iliacs bilaterally.  Last dose of Eliquis was last night.  Last food was also last night.  Patient denies fever/chills, abdominal pain, chest pain, shortness of breath, dizziness, weakness, melena, nausea/vomiting, syncope, or any other complaints.   Past Medical History:  Diagnosis Date  . Carpal tunnel syndrome on both sides   . Chest pain   . Coronary artery calcification seen on CAT scan   . Diabetes mellitus without complication ([place])   . DVT (deep venous thrombosis) ([place])   . GERD (gastroesophageal reflux disease)   . Headache   . Hypercholesteremia   . Hyperlipidemia   . Hypertension   . PE (pulmonary embolism)   . Shortness of breath dyspnea    PE     Patient Active Problem List   Diagnosis Date Noted  . GIB (gastrointestinal bleeding) 08/05/2020  . Severe nonproliferative diabetic retinopathy of left eye, with macular edema, associated with type 2 diabetes mellitus ([place]) 12/23/2019  . Severe nonproliferative diabetic retinopathy of right eye, with macular edema, associated with type 2 diabetes mellitus  ([place]) 12/23/2019  . Pseudophakia 12/23/2019  . Generalized weakness   . Acute renal failure ([place])   . SOB (shortness of breath)   . Iron deficiency anemia   . Insulin dependent type 2 diabetes mellitus ([place])   . History of pulmonary embolus (PE)   . Acute deep vein thrombosis (DVT) of both lower extremities (HCC)   . Severe sepsis ([place]) 10/13/2019  . AKI (acute kidney injury) ([place]) 10/13/2019  . Metabolic acidosis 74/08/8785  . Acute metabolic encephalopathy 76/72/0947  . Emesis 10/13/2019  . GI (gastrointestinal bleed) 09/30/2019  . Syncope 09/30/2019  . Anemia due to acute blood loss 09/18/2019  . Rectal bleed 09/18/2019  . Pulmonary embolism ([place])   . Plantar fasciitis of right foot 05/21/2018  . Neck pain 05/20/2014  . Intractable back pain 05/20/2014  . Warfarin-induced coagulopathy ([place]) 05/20/2014  . Diabetes ([place]) 05/12/2010  . Essential hypertension 05/12/2010  . GERD 05/12/2010  . COLONIC POLYPS, HX OF 05/12/2010    Past Surgical History:  Procedure Laterality Date  . BILATERAL CARPAL TUNNEL RELEASE Bilateral   . CATARACT EXTRACTION W/PHACO Left 08/05/2006   Dr. Gershon Crane  . CATARACT EXTRACTION W/PHACO Right 08/18/2006   Dr. Gershon Crane  . COLONOSCOPY    . COLONOSCOPY WITH PROPOFOL Left 09/20/2019   Procedure: COLONOSCOPY WITH PROPOFOL;  Surgeon: Ronnette Juniper, MD;  Location: [facility];  Service: Gastroenterology;  Laterality: Left;  . COLONOSCOPY WITH PROPOFOL N/A 10/03/2019   Procedure: COLONOSCOPY WITH PROPOFOL;  Surgeon: Wilford Corner, MD;  Location: [facility];  Service: Endoscopy;  Laterality: N/A;  . ELBOW SURGERY     BONE SPURS REMOVED B ELBOWS  . EYE SURGERY Bilateral    cataracts  . FOOT SURGERY     HEEL SPURS REMOVED B HEELS  . HEMOSTASIS CLIP PLACEMENT  09/20/2019   Procedure: HEMOSTASIS CLIP PLACEMENT;  Surgeon: Ronnette Juniper, MD;  Location: [facility];  Service: Gastroenterology;;  . HEMOSTASIS CLIP PLACEMENT  10/03/2019   Procedure: HEMOSTASIS  CLIP PLACEMENT;  Surgeon: Wilford Corner, MD;  Location: [facility];  Service: Endoscopy;;  . HOT HEMOSTASIS N/A 09/20/2019   Procedure: HOT HEMOSTASIS (ARGON PLASMA COAGULATION/BICAP);  Surgeon: Ronnette Juniper, MD;  Location: [facility];  Service: Gastroenterology;  Laterality: N/A;  . I & D EXTREMITY Right 09/18/2014   Procedure: IRRIGATION AND DEBRIDEMENT RIGHT THUMB, open fracture repair;  Surgeon: Linna Hoff, MD;  Location: [facility];  Service: Orthopedics;  Laterality: Right;  . IR IVC FILTER PLMT / S&I Burke Keels GUID/MOD SED  10/03/2019  . LEG SURGERY Left    left thigh table saw accident, "cut to the bone", I&D/suturing  . SHOULDER SURGERY Left    Bone spur  . SPINE SURGERY  09/04/1985   lumbar spine  . SUBMUCOSAL INJECTION  10/03/2019   Procedure: SUBMUCOSAL INJECTION;  Surgeon: Wilford Corner, MD;  [facility];  Service: Endoscopy;;  . TOE SURGERY         Family History  Problem Relation Age of Onset  . Cancer Mother   . Diabetes Father     Social History   Tobacco Use  . Smoking status: Former Smoker    Packs/day: 0.50    Types: Cigarettes    Quit date: 1964    Years since quitting: 57.9  . Smokeless tobacco: Former Systems developer  . Tobacco comment: smoking since 74 years old  Vaping Use  . Vaping Use: Never used  Substance Use Topics  . Alcohol use: No  . Drug use: No    Home Medications Prior to Admission medications   Medication Sig Start Date End Date Taking? Authorizing Provider  apixaban (ELIQUIS) 5 MG TABS tablet Take 1 tablet (5 mg total) by mouth 2 (two) times daily. 10/22/19  Yes Georgette Shell, MD  aspirin EC 81 MG tablet Take 81 mg by mouth every evening.    Yes [provider]  atorvastatin (LIPITOR) 40 MG tablet Take 1 tablet (40 mg total) by mouth at bedtime. 04/20/20 10/17/20 Yes Tolia, Sunit, DO  augmented betamethasone dipropionate (DIPROLENE-AF) 0.05 % cream Apply 1 application topically daily as needed (itchy skin near bottom).   10/28/18  Yes [provider]  clindamycin (CLEOCIN T) 1 % external solution Apply 1 application topically in the morning and at bedtime. 07/20/20  Yes [provider]  clotrimazole-betamethasone (LOTRISONE) cream Apply 1 application topically 2 (two) times daily as needed (itching).  07/11/19  Yes [provider]  dapagliflozin propanediol (FARXIGA) 5 MG TABS tablet Take 5 mg by mouth daily.   Yes Jani Gravel, MD  diclofenac sodium (VOLTAREN) 1 % GEL Apply 1 application topically 4 (four) times daily as needed (pain).   Yes [provider]  doxycycline (ADOXA) 100 MG tablet Take 100 mg by mouth daily. 07/20/20  Yes [provider]  furosemide (LASIX) 80 MG tablet Take 80 mg by mouth daily. 11/13/19  Yes [provider]  insulin NPH-regular Human (NOVOLIN 70/30) (70-30) 100 UNIT/ML injection Inject 30 Units into the skin 2 (two) times daily  with a meal.    Yes [provider]  metoprolol succinate (TOPROL-XL) 25 MG 24 hr tablet TAKE 1 TABLET BY MOUTH WITH OR AFTER DINNER DAILY Patient taking differently: Take 25 mg by mouth every evening.  07/26/20  Yes Tolia, Sunit, DO  senna (SENOKOT) 8.6 MG TABS tablet Take 2 tablets (17.2 mg total) by mouth at bedtime. 10/17/19  Yes Georgette Shell, MD  vitamin B-12 (CYANOCOBALAMIN) 500 MCG tablet Take 500 mcg by mouth daily.    Yes [provider]  Vitamin D, Ergocalciferol, (DRISDOL) 1.25 MG (50000 UNIT) CAPS capsule Take 50,000 Units by mouth every Sunday.    Yes [provider]  BD INSULIN SYRINGE U/F 31G X 5/16" 0.5 ML MISC 2 (two) times daily. 07/30/19   [provider]  glucose blood (ONE TOUCH ULTRA TEST) test strip Use to test blood sugar twice a day 12/12/18   [provider]  Lancets (ONETOUCH DELICA PLUS ERDEYC14G) [place] Use to test blood sugar twice daily 12/12/18   [provider]  lidocaine (XYLOCAINE) 2 % jelly Apply 1 application topically as  needed. Patient not taking: Reported on 08/05/2020 10/28/19   Ward, Delice Bison, DO    Allergies    Cortisone  Review of Systems   Review of Systems  Constitutional: Negative for fever.  Respiratory: Negative for shortness of breath.   Cardiovascular: Negative for chest pain.  Gastrointestinal: Positive for blood in stool. Negative for abdominal pain, diarrhea, nausea and vomiting.  Musculoskeletal: Negative for back pain.  Neurological: Negative for dizziness, syncope and weakness.  All other systems reviewed and are negative.   Physical Exam Updated Vital Signs BP 125/74   Pulse (!) 52   Temp 98.2 F (36.8 C) (Oral)   Resp (!) 22   Ht 5' 8.5" (1.74 m)   Wt 87.1 kg   SpO2 100%   BMI 28.77 kg/m   Physical Exam Vitals and nursing note reviewed.  Constitutional:      General: He is not in acute distress.    Appearance: He is well-developed. He is not diaphoretic.  HENT:     Head: Normocephalic and atraumatic.     Mouth/Throat:     Mouth: Mucous membranes are moist.     Pharynx: Oropharynx is clear.  Eyes:     Conjunctiva/sclera: Conjunctivae normal.  Cardiovascular:     Rate and Rhythm: Normal rate and regular rhythm.     Pulses: Normal pulses.          Radial pulses are 2+ on the right side and 2+ on the left side.       Posterior tibial pulses are 2+ on the right side and 2+ on the left side.     Comments: Tactile temperature in the extremities appropriate and equal bilaterally. Pulmonary:     Effort: Pulmonary effort is normal. No respiratory distress.     Breath sounds: Normal breath sounds.  Abdominal:     Palpations: Abdomen is soft.     Tenderness: There is no abdominal tenderness. There is no guarding.  Genitourinary:    Comments: Rectal Exam:  No external hemorrhoids, fissures, or lesions noted.  No frank blood or melena. No stool burden.  No rectal tenderness. No foreign bodies noted.   Musculoskeletal:     Cervical back: Neck supple.     Right lower  leg: No edema.     Left lower leg: No edema.  Skin:    General: Skin is warm and dry.  Neurological:     Mental Status: He is alert.  Psychiatric:        Mood and Affect: Mood and affect normal.        Speech: Speech normal.        Behavior: Behavior normal.     ED Results / Procedures / Treatments   Labs (all labs ordered are listed, but only abnormal results are displayed) Labs Reviewed  COMPREHENSIVE METABOLIC PANEL - Abnormal; Notable for the following components:      Result Value   Glucose, Bld 127 (*)    BUN 26 (*)    Creatinine, Ser 1.48 (*)    GFR, Estimated 49 (*)    All other components within normal limits  POC OCCULT BLOOD, ED - Abnormal; Notable for the following components:   Fecal Occult Bld POSITIVE (*)    All other components within normal limits  RESP PANEL BY RT-PCR (FLU A&B, COVID) ARPGX2  CBC  HEMOGLOBIN AND HEMATOCRIT, BLOOD  TYPE AND SCREEN    EKG None  Radiology No results found.  Procedures Procedures (including critical care time)  Medications Ordered in ED Medications - No data to display  ED Course  I have reviewed the triage vital signs and the nursing notes.  Pertinent labs & imaging results that were available during my care of the patient were reviewed by me and considered in my medical decision making (see chart for details).  Clinical Course as of Aug 05 1337  Thu Aug 05, 2020  1044 Spoke with Dr. Michail Sermon, [facility]. We discussed the patient's symptoms, physical exam, as well as his anticoagulation and previous history.  He thinks it is a good idea for the patient to be admitted by the medicine service for at least observation.   He also suspects patient may need to be removed from his Eliquis while he is in the hospital.   [SJ]  [place] Spoke with Dr. Marylyn Ishihara, hospitalist. States he will admit the patient. He states he will likely hold the patient's Eliquis.  Consult with vascular surgery would be helpful to discuss patient's  anticoagulation moving forward.   [SJ]    Clinical Course User Index [SJ] Elaine Middleton, Helane Gunther, PA-C   MDM Rules/Calculators/A&P                          Patient presents with complaint of bloody stools. Patient is nontoxic appearing, afebrile, not tachycardic, not tachypneic, not hypotensive, maintains excellent SPO2 on room air, and is in no apparent distress.   I have reviewed the patient's chart to obtain more information.   I reviewed and interpreted the patient's labs. Patient's hemoglobin stable here in the ED, however, concern for possible change due to patient's age, history of GI bleed, and anticoagulation.  Admitted via hospitalist for observation.  Vitals:   08/05/20 1030 08/05/20 1100 08/05/20 1208 08/05/20 1310  BP: 117/68 121/65 124/82 117/68  Pulse: 67 63 65 63  Resp: (!) 21 20 (!) 23 17  Temp:    98.2 F (36.8 C)  TempSrc:    Oral  SpO2: 100% 100% 100% 100%  Weight:      Height:         Final Clinical Impression(s) / ED Diagnoses Final diagnoses:  Bloody stools    Rx / DC Orders ED Discharge Orders    None       Layla Maw 08/05/20 1340    Tegeler, Gwenyth Allegra, MD  08/05/20 1606  

## 2020-08-05 NOTE — H&P (Signed)
History and Physical    Matthew Zimmerman GNF:[id number] DOB: [birth year] DOA: 08/05/2020  PCP: Jani Gravel, MD  Patient coming from: Home  Chief Complaint: GIB  HPI: Matthew Zimmerman is a 74 y.o. male with medical history significant of DVT, HTN, HLD. Presenting with rectal bleeding starting last night. He states he's had 2 episodes of bleeding last night and this morning. BRBPR and blood mixed in stool. No abdominal pain, N/V. Denies any other symptoms. After his second episode this morning, he decided that he needed to come to the ED. Reports that the last time he had an episode like this was in January of this year.    ED Course: FOBT positive in the ED. Hgb was ok. EDP spoke with GI ([facility]). Recommended obs admit. TRH was called for admission.   Review of Systems:  Denies CP, palpitations, syncopal episodes., N/V. Review of systems is otherwise negative for all not mentioned in HPI.   PMHx Past Medical History:  Diagnosis Date  . Carpal tunnel syndrome on both sides   . Chest pain   . Coronary artery calcification seen on CAT scan   . Diabetes mellitus without complication ([place])   . DVT (deep venous thrombosis) ([place])   . GERD (gastroesophageal reflux disease)   . Headache   . Hypercholesteremia   . Hyperlipidemia   . Hypertension   . PE (pulmonary embolism)   . Shortness of breath dyspnea    PE     PSHx Past Surgical History:  Procedure Laterality Date  . BILATERAL CARPAL TUNNEL RELEASE Bilateral   . CATARACT EXTRACTION W/PHACO Left 08/05/2006   Dr. Gershon Crane  . CATARACT EXTRACTION W/PHACO Right 08/18/2006   Dr. Gershon Crane  . COLONOSCOPY    . COLONOSCOPY WITH PROPOFOL Left 09/20/2019   Procedure: COLONOSCOPY WITH PROPOFOL;  Surgeon: Ronnette Juniper, MD;  [facility]: [facility];  Service: Gastroenterology;  Laterality: Left;  . COLONOSCOPY WITH PROPOFOL N/A 10/03/2019   Procedure: COLONOSCOPY WITH PROPOFOL;  Surgeon: Wilford Corner, MD;  Location: [facility];  Service:  Endoscopy;  Laterality: N/A;  . ELBOW SURGERY     BONE SPURS REMOVED B ELBOWS  . EYE SURGERY Bilateral    cataracts  . FOOT SURGERY     HEEL SPURS REMOVED B HEELS  . HEMOSTASIS CLIP PLACEMENT  09/20/2019   Procedure: HEMOSTASIS CLIP PLACEMENT;  Surgeon: Ronnette Juniper, MD;  Location: [facility];  Service: Gastroenterology;;  . HEMOSTASIS CLIP PLACEMENT  10/03/2019   Procedure: HEMOSTASIS CLIP PLACEMENT;  Surgeon: Wilford Corner, MD;  Location: [facility];  Service: Endoscopy;;  . HOT HEMOSTASIS N/A 09/20/2019   Procedure: HOT HEMOSTASIS (ARGON PLASMA COAGULATION/BICAP);  Surgeon: Ronnette Juniper, MD;  Location: [facility];  Service: Gastroenterology;  Laterality: N/A;  . I & D EXTREMITY Right 09/18/2014   Procedure: IRRIGATION AND DEBRIDEMENT RIGHT THUMB, open fracture repair;  Surgeon: Linna Hoff, MD;  Location: [facility];  Service: Orthopedics;  Laterality: Right;  . IR IVC FILTER PLMT / S&I Burke Keels GUID/MOD SED  10/03/2019  . LEG SURGERY Left    left thigh table saw accident, "cut to the bone", I&D/suturing  . SHOULDER SURGERY Left    Bone spur  . SPINE SURGERY  09/04/1985   lumbar spine  . SUBMUCOSAL INJECTION  10/03/2019   Procedure: SUBMUCOSAL INJECTION;  Surgeon: Wilford Corner, MD;  [facility]: [facility];  Service: Endoscopy;;  . TOE SURGERY      SocHx  reports that he quit smoking about 57 years ago. His smoking use  included cigarettes. He smoked 0.50 packs per day. He has quit using smokeless tobacco. He reports that he does not drink alcohol and does not use drugs.  Allergies  Allergen Reactions  . Cortisone Other (See Comments)    CBG thrown out of control    FamHx Family History  Problem Relation Age of Onset  . Cancer Mother   . Diabetes Father     Prior to Admission medications   Medication Sig Start Date End Date Taking? Authorizing Provider  apixaban (ELIQUIS) 5 MG TABS tablet Take 1 tablet (5 mg total) by mouth 2 (two) times daily. 10/22/19  Yes Georgette Shell, MD  aspirin EC 81 MG tablet Take 81 mg by mouth every evening.    Yes [provider]  atorvastatin (LIPITOR) 40 MG tablet Take 1 tablet (40 mg total) by mouth at bedtime. 04/20/20 10/17/20 Yes Tolia, Sunit, DO  augmented betamethasone dipropionate (DIPROLENE-AF) 0.05 % cream Apply 1 application topically daily as needed (itchy skin near bottom).  10/28/18  Yes [provider]  clindamycin (CLEOCIN T) 1 % external solution Apply 1 application topically in the morning and at bedtime. 07/20/20  Yes [provider]  clotrimazole-betamethasone (LOTRISONE) cream Apply 1 application topically 2 (two) times daily as needed (itching).  07/11/19  Yes [provider]  dapagliflozin propanediol (FARXIGA) 5 MG TABS tablet Take 5 mg by mouth daily.   Yes Jani Gravel, MD  doxycycline (ADOXA) 100 MG tablet Take 100 mg by mouth daily. 07/20/20  Yes [provider]  furosemide (LASIX) 80 MG tablet Take 80 mg by mouth daily. 11/13/19  Yes [provider]  insulin NPH-regular Human (NOVOLIN 70/30) (70-30) 100 UNIT/ML injection Inject 30 Units into the skin 2 (two) times daily with a meal.    Yes [provider]  metoprolol succinate (TOPROL-XL) 25 MG 24 hr tablet TAKE 1 TABLET BY MOUTH WITH OR AFTER DINNER DAILY Patient taking differently: Take 25 mg by mouth every evening.  07/26/20  Yes Tolia, Sunit, DO  senna (SENOKOT) 8.6 MG TABS tablet Take 2 tablets (17.2 mg total) by mouth at bedtime. 10/17/19  Yes Georgette Shell, MD  vitamin B-12 (CYANOCOBALAMIN) 500 MCG tablet Take 500 mcg by mouth daily.    Yes [provider]  Vitamin D, Ergocalciferol, (DRISDOL) 1.25 MG (50000 UNIT) CAPS capsule Take 50,000 Units by mouth every Sunday.    Yes [provider]  BD INSULIN SYRINGE U/F 31G X 5/16" 0.5 ML MISC 2 (two) times daily. 07/30/19   [provider]  diclofenac sodium (VOLTAREN) 1 % GEL Apply 1 application topically 4  (four) times daily as needed (pain).    [provider]  glucose blood (ONE TOUCH ULTRA TEST) test strip Use to test blood sugar twice a day 12/12/18   [provider]  Lancets (ONETOUCH DELICA PLUS [id number]) [place] Use to test blood sugar twice daily 12/12/18   [provider]  lidocaine (XYLOCAINE) 2 % jelly Apply 1 application topically as needed. 10/28/19   Ward, Delice Bison, DO    Physical Exam: Vitals:   08/05/20 0930 08/05/20 1030 08/05/20 1100 08/05/20 1208  BP: 117/65 117/68 121/65 124/82  Pulse: (!) 51 67 63 65  Resp: 18 (!) 21 20 (!) 23  Temp:      TempSrc:      SpO2: 100% 100% 100% 100%  Weight:      Height:        General: 74 y.o. male  resting in bed in NAD Eyes: PERRL, normal sclera ENMT: Nares patent w/o discharge, orophaynx clear, dentition normal, ears w/o discharge/lesions/ulcers Neck: Supple, trachea midline Cardiovascular: RRR, +S1, S2, no m/g/r, equal pulses throughout Respiratory: CTABL, no w/r/r, normal WOB GI: BS+, NDNT, no masses noted, no organomegaly noted MSK: No e/c/c Skin: No rashes, bruises, ulcerations noted Neuro: A&O x 3, no focal deficits, hard of hearing Psyc: Appropriate interaction and affect, calm/cooperative  Labs on Admission: I have personally reviewed following labs and imaging studies  CBC: Recent Labs  Lab 08/05/20 0440 08/05/20 0949  WBC 8.4  --   HGB 14.0 14.0  HCT 44.1 44.5  MCV 88.7  --   PLT 186  --    Basic Metabolic Panel: Recent Labs  Lab 08/05/20 0440  NA 142  K 3.5  CL 107  CO2 24  GLUCOSE 127*  BUN 26*  CREATININE 1.48*  CALCIUM 9.3   GFR: Estimated Creatinine Clearance: 46.7 mL/min (A) (by C-G formula based on SCr of 1.48 mg/dL (H)). Liver Function Tests: Recent Labs  Lab 08/05/20 0440  AST 24  ALT 31  ALKPHOS 89  BILITOT 0.8  PROT 7.9  ALBUMIN 4.4   No results for input(s): LIPASE, AMYLASE in the last 168 hours. No results for input(s): AMMONIA in the last 168  hours. Coagulation Profile: No results for input(s): INR, PROTIME in the last 168 hours. Cardiac Enzymes: No results for input(s): CKTOTAL, CKMB, CKMBINDEX, TROPONINI in the last 168 hours. BNP (last 3 results) No results for input(s): PROBNP in the last 8760 hours. HbA1C: No results for input(s): HGBA1C in the last 72 hours. CBG: No results for input(s): GLUCAP in the last 168 hours. Lipid Profile: No results for input(s): CHOL, HDL, LDLCALC, TRIG, CHOLHDL, LDLDIRECT in the last 72 hours. Thyroid Function Tests: No results for input(s): TSH, T4TOTAL, FREET4, T3FREE, THYROIDAB in the last 72 hours. Anemia Panel: No results for input(s): VITAMINB12, FOLATE, FERRITIN, TIBC, IRON, RETICCTPCT in the last 72 hours. Urine analysis:    Component Value Date/Time   COLORURINE YELLOW 10/28/2019 1154   APPEARANCEUR CLEAR 10/28/2019 1154   LABSPEC 1.025 10/28/2019 1154   PHURINE 5.0 10/28/2019 1154   GLUCOSEU >=500 (A) 10/28/2019 1154   HGBUR SMALL (A) 10/28/2019 1154   BILIRUBINUR NEGATIVE 10/28/2019 1154   BILIRUBINUR neg 10/15/2012 1358   KETONESUR NEGATIVE 10/28/2019 1154   PROTEINUR NEGATIVE 10/28/2019 1154   UROBILINOGEN 1.0 05/19/2014 1814   NITRITE NEGATIVE 10/28/2019 1154   LEUKOCYTESUR NEGATIVE 10/28/2019 1154    Radiological Exams on Admission: No results found.  Assessment/Plan GIB     - admit to obs, med-surg     - q8h H&H     - transfuse as necessary     - [facility] onboard     - hold eliquis, ASA for right now     - can have CLD  Hx of BLE DVT and PE     - PE back in 2014 pt patient     - BLE DVT this year; states he had a recent US that showed clearing of the RLE and old clot in the LLE     - he has an IVC filter     - ok to hold eliquis tonight; he hasn't had a bleed since January, can likely be resumed on eliquis if stable this admission  HLD     - statin  HTN     - hold lasix and metoprolol for tonight; reassess for  resuming tomorrow  AKI     - Scr is  up a little bit from baseline     - will give gentle fluids, follow     - no signs of obstruction  DM2     - SSI, CLD, A1x, NPH, glucose checks  DVT prophylaxis: IVC filter  Code Status: FULL  Family Communication: None at bedside.   Consults called: EDP spoke with GI  Status is: Observation  The patient remains OBS appropriate and will d/c before 2 midnights.  Dispo: The patient is from: Home              Anticipated d/c is to: Home              Anticipated d/c date is: 1 day              Patient currently is not medically stable to d/c.  Jonnie Finner DO [facility]  If 7PM-7AM, please contact night-coverage [email or web address]  08/05/2020, 12:48 PM

## 2020-08-05 NOTE — ED Notes (Signed)
Pt ambulatory to bathroom. Pt states there was no blood in feces at this time.

## 2020-08-05 NOTE — Progress Notes (Signed)
Office and hospital computer chart reviewed unfortunately when I came by to see the patient he was in the bathroom currently hemoglobin is normal and his last bowel movement per the nurse did not show any blood so I will allow clear liquids and can watch overnight and if no change in hemoglobin probably can be discharged but we will check on later today or tomorrow and recheck CBC in a.m. and please call us sooner if significant question or problem or signs of GI blood loss

## 2020-08-05 NOTE — Consult Note (Signed)
Reason for Consult: Red blood per rectum Referring Physician: Hospital team  Matthew Zimmerman is an 74 y.o. male.  HPI: Patient seen and examined in hospital computer chart in our office computer chart reviewed and he had 2 bouts of bright red blood per rectum at home.any other GI symptoms but has had 2 normal bowel movements here without any further blood and he has no anal rectal complaints nor any upper tract symptoms and he is on an aspirin a day as well as Eliquis and does take some Tylenol periodically but denies other over-the-counter medicines and his previous work-up was reviewed and he has no other complaints  Past Medical History:  Diagnosis Date  . Carpal tunnel syndrome on both sides   . Chest pain   . Coronary artery calcification seen on CAT scan   . Diabetes mellitus without complication ([place])   . DVT (deep venous thrombosis) ([place])   . GERD (gastroesophageal reflux disease)   . Headache   . Hypercholesteremia   . Hyperlipidemia   . Hypertension   . PE (pulmonary embolism)   . Shortness of breath dyspnea    PE     Past Surgical History:  Procedure Laterality Date  . BILATERAL CARPAL TUNNEL RELEASE Bilateral   . CATARACT EXTRACTION W/PHACO Left 08/05/2006   Dr. Gershon Crane  . CATARACT EXTRACTION W/PHACO Right 08/18/2006   Dr. Gershon Crane  . COLONOSCOPY    . COLONOSCOPY WITH PROPOFOL Left 09/20/2019   Procedure: COLONOSCOPY WITH PROPOFOL;  Surgeon: Ronnette Juniper, MD;  Location: [facility];  Service: Gastroenterology;  Laterality: Left;  . COLONOSCOPY WITH PROPOFOL N/A 10/03/2019   Procedure: COLONOSCOPY WITH PROPOFOL;  Surgeon: Wilford Corner, MD;  Location: [facility];  Service: Endoscopy;  Laterality: N/A;  . ELBOW SURGERY     BONE SPURS REMOVED B ELBOWS  . EYE SURGERY Bilateral    cataracts  . FOOT SURGERY     HEEL SPURS REMOVED B HEELS  . HEMOSTASIS CLIP PLACEMENT  09/20/2019   Procedure: HEMOSTASIS CLIP PLACEMENT;  Surgeon: Ronnette Juniper, MD;  Location: [facility];  Service: Gastroenterology;;  . HEMOSTASIS CLIP PLACEMENT  10/03/2019   Procedure: HEMOSTASIS CLIP PLACEMENT;  Surgeon: Wilford Corner, MD;  Location: [facility];  Service: Endoscopy;;  . HOT HEMOSTASIS N/A 09/20/2019   Procedure: HOT HEMOSTASIS (ARGON PLASMA COAGULATION/BICAP);  Surgeon: Ronnette Juniper, MD;  Location: [facility];  Service: Gastroenterology;  Laterality: N/A;  . I & D EXTREMITY Right 09/18/2014   Procedure: IRRIGATION AND DEBRIDEMENT RIGHT THUMB, open fracture repair;  Surgeon: Linna Hoff, MD;  Location: [facility];  Service: Orthopedics;  Laterality: Right;  . IR IVC FILTER PLMT / S&I Burke Keels GUID/MOD SED  10/03/2019  . LEG SURGERY Left    left thigh table saw accident, "cut to the bone", I&D/suturing  . SHOULDER SURGERY Left    Bone spur  . SPINE SURGERY  09/04/1985   lumbar spine  . SUBMUCOSAL INJECTION  10/03/2019   Procedure: SUBMUCOSAL INJECTION;  Surgeon: Wilford Corner, MD;  Location: [facility];  Service: Endoscopy;;  . TOE SURGERY      Family History  Problem Relation Age of Onset  . Cancer Mother   . Diabetes Father     Social History:  reports that he quit smoking about 57 years ago. His smoking use included cigarettes. He smoked 0.50 packs per day. He has quit using smokeless tobacco. He reports that he does not drink alcohol and does not use drugs.  Allergies:  Allergies  Allergen  Reactions  . Cortisone Other (See Comments)    CBG thrown out of control    Medications: I have reviewed the patient's current medications.  Results for orders placed or performed during the hospital encounter of 08/05/20 (from the past 48 hour(s))  Comprehensive metabolic panel     Status: Abnormal   Collection Time: 08/05/20  4:40 AM  Result Value Ref Range   Sodium 142 135 - 145 mmol/L   Potassium 3.5 3.5 - 5.1 mmol/L   Chloride 107 98 - 111 mmol/L   CO2 24 22 - 32 mmol/L   Glucose, Bld 127 (H) 70 - 99 mg/dL    Comment: Glucose reference range applies  only to samples taken after fasting for at least 8 hours.   BUN 26 (H) 8 - 23 mg/dL   Creatinine, Ser 1.48 (H) 0.61 - 1.24 mg/dL   Calcium 9.3 8.9 - 10.3 mg/dL   Total Protein 7.9 6.5 - 8.1 g/dL   Albumin 4.4 3.5 - 5.0 g/dL   AST 24 15 - 41 U/L   ALT 31 0 - 44 U/L   Alkaline Phosphatase 89 38 - 126 U/L   Total Bilirubin 0.8 0.3 - 1.2 mg/dL   GFR, Estimated 49 (L) >60 mL/min    Comment: (NOTE) Calculated using the CKD-EPI Creatinine Equation (2021)    Anion gap 11 5 - 15    Comment: Performed at [facility], [place] [address] [place], [place] [address]  CBC     Status: None   Collection Time: 08/05/20  4:40 AM  Result Value Ref Range   WBC 8.4 4.0 - 10.5 K/uL   RBC 4.97 4.22 - 5.81 MIL/uL   Hemoglobin 14.0 13.0 - 17.0 g/dL   HCT 44.1 39 - 52 %   MCV 88.7 80.0 - 100.0 fL   MCH 28.2 26.0 - 34.0 pg   MCHC 31.7 30.0 - 36.0 g/dL   RDW 14.6 11.5 - 15.5 %   Platelets 186 150 - 400 K/uL   nRBC 0.0 0.0 - 0.2 %    Comment: Performed at [facility], [place] [address] [place], [place] [address]  Type and screen [place]     Status: None   Collection Time: 08/05/20  4:40 AM  Result Value Ref Range   ABO/RH(D) A POS    Antibody Screen NEG    Sample Expiration      08/08/2020,2359 Performed at [facility], [place] [address], [place], [place] [address]   POC occult blood, ED     Status: Abnormal   Collection Time: 08/05/20  9:47 AM  Result Value Ref Range   Fecal Occult Bld POSITIVE (A) NEGATIVE  Hemoglobin and hematocrit, blood     Status: None   Collection Time: 08/05/20  9:49 AM  Result Value Ref Range   Hemoglobin 14.0 13.0 - 17.0 g/dL   HCT 44.5 39 - 52 %    Comment: Performed at [facility], [place] [address], [place],  [address]  Resp Panel by RT-PCR (Flu A&B, Covid) Nasopharyngeal Swab     Status: None   Collection Time: 08/05/20 11:42 AM   Specimen: Nasopharyngeal Swab;  Nasopharyngeal(NP) swabs in vial transport medium  Result Value Ref Range   SARS Coronavirus 2 by RT PCR NEGATIVE NEGATIVE    Comment: (NOTE) SARS-CoV-2 target nucleic acids are NOT DETECTED.  The SARS-CoV-2 RNA is generally detectable in upper respiratory specimens during the acute phase  of infection. The lowest concentration of SARS-CoV-2 viral copies this assay can detect is 138 copies/mL. A negative result does not preclude SARS-Cov-2 infection and should not be used as the sole basis for treatment or other patient management decisions. A negative result may occur with  improper specimen collection/handling, submission of specimen other than nasopharyngeal swab, presence of viral mutation(s) within the areas targeted by this assay, and inadequate number of viral copies(<138 copies/mL). A negative result must be combined with clinical observations, patient history, and epidemiological information. The expected result is Negative.  Fact Sheet for Patients:  EntrepreneurPulse.com.au  Fact Sheet for Healthcare Providers:  IncredibleEmployment.be  This test is no t yet approved or cleared by the [place] FDA and  has been authorized for detection and/or diagnosis of SARS-CoV-2 by FDA under an Emergency Use Authorization (EUA). This EUA will remain  in effect (meaning this test can be used) for the duration of the COVID-19 declaration under Section 564(b)(1) of the Act, 21 U.S.C.section 360bbb-3(b)(1), unless the authorization is terminated  or revoked sooner.       Influenza A by PCR NEGATIVE NEGATIVE   Influenza B by PCR NEGATIVE NEGATIVE    Comment: (NOTE) The Xpert Xpress SARS-CoV-2/FLU/RSV plus assay is intended as an aid in the diagnosis of influenza from Nasopharyngeal swab specimens and should not be used as a sole basis for treatment. Nasal washings and aspirates are unacceptable for Xpert Xpress  SARS-CoV-2/FLU/RSV testing.  Fact Sheet for Patients: EntrepreneurPulse.com.au  Fact Sheet for Healthcare Providers: IncredibleEmployment.be  This test is not yet approved or cleared by the [place] FDA and has been authorized for detection and/or diagnosis of SARS-CoV-2 by FDA under an Emergency Use Authorization (EUA). This EUA will remain in effect (meaning this test can be used) for the duration of the COVID-19 declaration under Section 564(b)(1) of the Act, 21 U.S.C. section 360bbb-3(b)(1), unless the authorization is terminated or revoked.  Performed at [facility], [place] [address] [place], [place] [address]   HEMOGLOBIN AND HEMATOCRIT, BLOOD     Status: None   Collection Time: 08/05/20  2:44 PM  Result Value Ref Range   Hemoglobin 13.4 13.0 - 17.0 g/dL   HCT 43.1 39 - 52 %    Comment: Performed at [facility], [place] [address], [place], [place] [address]    No results found.  Review of Systems negative except above although he does have leg pain and occasional left arm pain Blood pressure 117/68, pulse 63, temperature 98.2 F (36.8 C), temperature source Oral, resp. rate 17, height 5' 8.5" (1.74 m), weight 87.1 kg, SpO2 100 %. Physical Exam vital signs stable afebrile no acute distress exam pertinent for his abdomen being soft nontender labs reviewed hemoglobin normal BUN approximately baseline creatinine up a little MCV okay previous CT reviewed  Assessment/Plan: Bright red blood per rectum in patient with history of AVMs on aspirin and blood thinner Plan: We will allow clear liquids and observation only but if no more signs of bleeding hopefully can go home tomorrow without needing GI work-up  Oluwatoyin Banales E 08/05/2020, 4:38 PM

## 2020-08-06 DIAGNOSIS — Z86718 Personal history of other venous thrombosis and embolism: Secondary | ICD-10-CM | POA: Diagnosis not present

## 2020-08-06 DIAGNOSIS — I10 Essential (primary) hypertension: Secondary | ICD-10-CM

## 2020-08-06 DIAGNOSIS — N179 Acute kidney failure, unspecified: Secondary | ICD-10-CM

## 2020-08-06 DIAGNOSIS — D649 Anemia, unspecified: Secondary | ICD-10-CM | POA: Diagnosis not present

## 2020-08-06 DIAGNOSIS — K625 Hemorrhage of anus and rectum: Secondary | ICD-10-CM | POA: Diagnosis not present

## 2020-08-06 DIAGNOSIS — E1165 Type 2 diabetes mellitus with hyperglycemia: Secondary | ICD-10-CM

## 2020-08-06 DIAGNOSIS — K922 Gastrointestinal hemorrhage, unspecified: Secondary | ICD-10-CM | POA: Diagnosis not present

## 2020-08-06 LAB — COMPREHENSIVE METABOLIC PANEL
ALT: 23 U/L (ref 0–44)
AST: 19 U/L (ref 15–41)
Albumin: 3.4 g/dL — ABNORMAL LOW (ref 3.5–5.0)
Alkaline Phosphatase: 73 U/L (ref 38–126)
Anion gap: 8 (ref 5–15)
BUN: 15 mg/dL (ref 8–23)
CO2: 23 mmol/L (ref 22–32)
Calcium: 8.5 mg/dL — ABNORMAL LOW (ref 8.9–10.3)
Chloride: 109 mmol/L (ref 98–111)
Creatinine, Ser: 1.19 mg/dL (ref 0.61–1.24)
GFR, Estimated: >60 mL/min (ref >60)
Glucose, Bld: 139 mg/dL — ABNORMAL HIGH (ref 70–99)
Potassium: 3.8 mmol/L (ref 3.5–5.1)
Sodium: 140 mmol/L (ref 135–145)
Total Bilirubin: 1 mg/dL (ref 0.3–1.2)
Total Protein: 6.6 g/dL (ref 6.5–8.1)

## 2020-08-06 LAB — GLUCOSE, CAPILLARY
Glucose-Capillary: 129 mg/dL — ABNORMAL HIGH (ref 70–99)
Glucose-Capillary: 256 mg/dL — ABNORMAL HIGH (ref 70–99)

## 2020-08-06 LAB — HEMOGLOBIN AND HEMATOCRIT, BLOOD
HCT: 40.4 % (ref 39.0–52.0)
Hemoglobin: 12.7 g/dL — ABNORMAL LOW (ref 13.0–17.0)

## 2020-08-06 MED ORDER — FUROSEMIDE 80 MG PO TABS
40.0000 mg | ORAL_TABLET | Freq: Every day | ORAL | Status: DC
Start: 2020-08-07 — End: 2021-05-04

## 2020-08-06 MED ORDER — APIXABAN 5 MG PO TABS: 5.0000 mg | ORAL_TABLET | Freq: Two times a day (BID) | ORAL | Status: AC

## 2020-08-06 NOTE — Progress Notes (Signed)
[facility] Gastroenterology Progress Note  Matthew Zimmerman 74 y.o. [birth year]  CC:  Rectal bleeding  Subjective: Patient feeling well and denies any complaints.  Requests advancement of diet.  No further episodes of GI bleeding, reports a regular stool without bleeding.  ROS : Review of Systems  Respiratory: Negative for shortness of breath.   Cardiovascular: Negative for chest pain and palpitations.  Gastrointestinal: Negative for abdominal pain, blood in stool, constipation, diarrhea, heartburn, melena, nausea and vomiting.    Objective: Vital signs in last 24 hours: Vitals:   08/05/20 2130 08/06/20 0518  BP: 118/67 113/65  Pulse: (!) 58 (!) 51  Resp: 18 16  Temp: 98.2 F (36.8 C) 98.1 F (36.7 C)  SpO2: 99% 98%    Physical Exam:  General:  Sleeping but easily arouses to voice alone, oriented, cooperative, no distress  Head:  Normocephalic, without obvious abnormality, atraumatic  Eyes:  Anicteric sclera, EOMs intact  Lungs:   Clear to auscultation bilaterally, respirations unlabored  Heart:  Regular rate and rhythm, S1, S2 normal  Abdomen:   Soft, non-tender, bowel sounds active all four quadrants, no guarding or peritoneal signs   Extremities: Extremities normal, atraumatic, no  edema  Pulses: 2+ and symmetric    Lab Results: Recent Labs    08/05/20 0440 08/06/20 0512  NA 142 140  K 3.5 3.8  CL 107 109  CO2 24 23  GLUCOSE 127* 139*  BUN 26* 15  CREATININE 1.48* 1.19  CALCIUM 9.3 8.5*   Recent Labs    08/05/20 0440 08/06/20 0512  AST 24 19  ALT 31 23  ALKPHOS 89 73  BILITOT 0.8 1.0  PROT 7.9 6.6  ALBUMIN 4.4 3.4*   Recent Labs    08/05/20 0440 08/05/20 0949 08/05/20 2237 08/06/20 0929  WBC 8.4  --   --   --   HGB 14.0   < > 12.3* 12.7*  HCT 44.1   < > 40.1 40.4  MCV 88.7  --   --   --   PLT 186  --   --   --    < > = values in this interval not displayed.   No results for input(s): LABPROT, INR in the last 72  hours.    Assessment: Rectal bleeding, resolved.  History of AVMs, on Eliquis -Today, Hgb is 12.7, stable as compared to 12.3 yesterday.  Decreased from initial hemoglobin 14.0, some of which may be dilutional.  Plan: Discussed with Dr. Watt Climes, [place] to discharge patient from a GI standpoint.  Resume Eliquis tomorrow.  Recommend outpatient FU with Dr. Paulita Fujita. Our office will call patient to arrange.  Information in AVS if patient would like to reach our office sooner.  [facility] will sign off.  Thank you for the consultation.  Please contact us if we can be of any further assistance during this hospital stay.  Salley Slaughter PA-C 08/06/2020, 10:12 AM  Contact #  [phone]

## 2020-08-06 NOTE — Discharge Summary (Signed)
Physician Discharge Summary  Matthew Zimmerman IHK:[id number] DOB: 12/08/1945 DOA: 08/05/2020  PCP: Jani Gravel, MD  Admit date: 08/05/2020 Discharge date: 08/06/2020  Admitted From: Home Disposition: Home  Recommendations for Outpatient Follow-up:  1. Follow up with PCP in 1 week with repeat CBC/BMP 2. Outpatient follow-up with GI and cardiology 3. Resume Eliquis from 08/07/2020 as per GI recommendations.  Hold aspirin till reevaluation by PCP. 4. Follow up in ED if symptoms worsen or new appear   Home Health: No Equipment/Devices: None  Discharge Condition: Stable CODE STATUS: Full Diet recommendation: Heart healthy/carb modified  Brief/Interim Summary: 74 y.o. male with medical history significant of DVT, HTN, HLD presented with rectal bleeding.  On presentation, hemoglobin was stable.  Patient was kept under observation and GI was consulted.  Eliquis was held.  During hospitalization, no further rectal bleeding.  Will advance diet today.  GI has cleared the patient for discharge and recommended to resume Eliquis from 08/07/2020 onwards.  Discharge patient home today with outpatient follow-up with PCP and GI.  Discharge Diagnoses:   Probable lower GI bleeding presenting with rectal bleeding -Patient has history of AVMs and is on Eliquis and aspirin at home.  Eliquis and aspirin on hold for now. -GI consultation appreciated.  Hemoglobin has remained stable.  No further rectal bleeding since admission. -GI has cleared the patient for discharge and recommended to resume Eliquis from 08/07/2020 onwards.  Discharge patient home today with outpatient follow-up with PCP and GI. -Hold aspirin till reevaluation by PCP.  History of bilateral DVT and PE with history of IVC filter -Eliquis plan as above.  Outpatient follow-up.  Hyperlipidemia -Continue statin  Hypertension -Blood pressure stable.  Resume metoprolol.  Decrease Lasix to 40 mg daily till reevaluation by PCP and/or  cardiology  Acute kidney injury -Resolved.  Resume Lasix at a lower dose as above from 08/07/2020 onwards.  Outpatient follow-up of BMP  Diabetes mellitus type 2 uncontrolled with hyperglycemia -Carb modified diet.  Continue home regimen.  Outpatient follow-up    Discharge Instructions  Discharge Instructions    Diet - low sodium heart healthy   Complete by: As directed    Diet Carb Modified   Complete by: As directed    Increase activity slowly   Complete by: As directed      Allergies as of 08/06/2020      Reactions   Cortisone Other (See Comments)   CBG thrown out of control      Medication List    STOP taking these medications   aspirin EC 81 MG tablet   lidocaine 2 % jelly Commonly known as: XYLOCAINE   senna 8.6 MG Tabs tablet Commonly known as: SENOKOT     TAKE these medications   apixaban 5 MG Tabs tablet Commonly known as: ELIQUIS Take 1 tablet (5 mg total) by mouth 2 (two) times daily. Start taking on: August 07, 2020   atorvastatin 40 MG tablet Commonly known as: LIPITOR Take 1 tablet (40 mg total) by mouth at bedtime.   augmented betamethasone dipropionate 0.05 % cream Commonly known as: DIPROLENE-AF Apply 1 application topically daily as needed (itchy skin near bottom).   BD Insulin Syringe U/F 31G X 5/16" 0.5 ML Misc Generic drug: Insulin Syringe-Needle U-100 2 (two) times daily.   clindamycin 1 % external solution Commonly known as: CLEOCIN T Apply 1 application topically in the morning and at bedtime.   clotrimazole-betamethasone cream Commonly known as: LOTRISONE Apply 1 application topically 2 (two) times daily as  needed (itching).   diclofenac sodium 1 % Gel Commonly known as: VOLTAREN Apply 1 application topically 4 (four) times daily as needed (pain).   doxycycline 100 MG tablet Commonly known as: ADOXA Take 100 mg by mouth daily.   Farxiga 5 MG Tabs tablet Generic drug: dapagliflozin propanediol Take 5 mg by mouth  daily.   furosemide 80 MG tablet Commonly known as: LASIX Take 0.5 tablets (40 mg total) by mouth daily. Start taking on: August 07, 2020 What changed: how much to take   insulin NPH-regular Human (70-30) 100 UNIT/ML injection Inject 30 Units into the skin 2 (two) times daily with a meal.   metoprolol succinate 25 MG 24 hr tablet Commonly known as: TOPROL-XL TAKE 1 TABLET BY MOUTH WITH OR AFTER DINNER DAILY What changed: See the new instructions.   ONE TOUCH ULTRA TEST test strip Generic drug: glucose blood Use to test blood sugar twice a day   OneTouch Delica Plus [id number] Misc Use to test blood sugar twice daily   vitamin B-12 500 MCG tablet Commonly known as: CYANOCOBALAMIN Take 500 mcg by mouth daily.   Vitamin D (Ergocalciferol) 1.25 MG (50000 UNIT) Caps capsule Commonly known as: DRISDOL Take 50,000 Units by mouth every Sunday.       Follow-up Information    Jani Gravel, MD. Schedule an appointment as soon as possible for a visit in 1 week(s).   Specialty: Internal Medicine Contact information: [place] [address] [phone]        Clarene Essex, MD. Schedule an appointment as soon as possible for a visit in 1 week(s).   Specialty: Gastroenterology Contact information: [address] [place] [place] [address] [phone]        Rex Kras, DO. Schedule an appointment as soon as possible for a visit in 1 week(s).   Specialties: Cardiology, Radiology, Vascular Surgery Contact information: [place] [address] [phone]              Allergies  Allergen Reactions  . Cortisone Other (See Comments)    CBG thrown out of control    Consultations:  GI   Procedures/Studies:  No results found.    Subjective: Patient seen and examined at bedside.  Has not had any rectal bleeding overnight.  Feels okay and thinks that he should be ready to go home today.  Wants to  eat solid food.  No worsening abdominal pain, fever overnight.  No shortness of breath reported.  Discharge Exam: Vitals:   08/05/20 2130 08/06/20 0518  BP: 118/67 113/65  Pulse: (!) 58 (!) 51  Resp: 18 16  Temp: 98.2 F (36.8 C) 98.1 F (36.7 C)  SpO2: 99% 98%    General: Pt is alert, awake, not in acute distress Cardiovascular: Mildly bradycardic, S1/S2 + Respiratory: bilateral decreased breath sounds at bases Abdominal: Soft, NT, ND, bowel sounds + Extremities: Trace lower extremity edema; no cyanosis    The results of significant diagnostics from this hospitalization (including imaging, microbiology, ancillary and laboratory) are listed below for reference.     Microbiology: Recent Results (from the past 240 hour(s))  Resp Panel by RT-PCR (Flu A&B, Covid) Nasopharyngeal Swab     Status: None   Collection Time: 08/05/20 11:42 AM   Specimen: Nasopharyngeal Swab; Nasopharyngeal(NP) swabs in vial transport medium  Result Value Ref Range Status   SARS Coronavirus 2 by RT PCR NEGATIVE NEGATIVE Final    Comment: (NOTE)  SARS-CoV-2 target nucleic acids are NOT DETECTED.  The SARS-CoV-2 RNA is generally detectable in upper respiratory specimens during the acute phase of infection. The lowest concentration of SARS-CoV-2 viral copies this assay can detect is 138 copies/mL. A negative result does not preclude SARS-Cov-2 infection and should not be used as the sole basis for treatment or other patient management decisions. A negative result may occur with  improper specimen collection/handling, submission of specimen other than nasopharyngeal swab, presence of viral mutation(s) within the areas targeted by this assay, and inadequate number of viral copies(<138 copies/mL). A negative result must be combined with clinical observations, patient history, and epidemiological information. The expected result is Negative.  Fact Sheet for Patients:   EntrepreneurPulse.com.au  Fact Sheet for Healthcare Providers:  IncredibleEmployment.be  This test is no t yet approved or cleared by the [place] FDA and  has been authorized for detection and/or diagnosis of SARS-CoV-2 by FDA under an Emergency Use Authorization (EUA). This EUA will remain  in effect (meaning this test can be used) for the duration of the COVID-19 declaration under Section 564(b)(1) of the Act, 21 U.S.C.section 360bbb-3(b)(1), unless the authorization is terminated  or revoked sooner.       Influenza A by PCR NEGATIVE NEGATIVE Final   Influenza B by PCR NEGATIVE NEGATIVE Final    Comment: (NOTE) The Xpert Xpress SARS-CoV-2/FLU/RSV plus assay is intended as an aid in the diagnosis of influenza from Nasopharyngeal swab specimens and should not be used as a sole basis for treatment. Nasal washings and aspirates are unacceptable for Xpert Xpress SARS-CoV-2/FLU/RSV testing.  Fact Sheet for Patients: EntrepreneurPulse.com.au  Fact Sheet for Healthcare Providers: IncredibleEmployment.be  This test is not yet approved or cleared by the [place] FDA and has been authorized for detection and/or diagnosis of SARS-CoV-2 by FDA under an Emergency Use Authorization (EUA). This EUA will remain in effect (meaning this test can be used) for the duration of the COVID-19 declaration under Section 564(b)(1) of the Act, 21 U.S.C. section 360bbb-3(b)(1), unless the authorization is terminated or revoked.  Performed at [facility], [place] [address] [place], [place] [address]      Labs: BNP (last 3 results) No results for input(s): BNP in the last 8760 hours. Basic Metabolic Panel: Recent Labs  Lab 08/05/20 0440 08/06/20 0512  NA 142 140  K 3.5 3.8  CL 107 109  CO2 24 23  GLUCOSE 127* 139*  BUN 26* 15  CREATININE 1.48* 1.19  CALCIUM 9.3 8.5*   Liver Function  Tests: Recent Labs  Lab 08/05/20 0440 08/06/20 0512  AST 24 19  ALT 31 23  ALKPHOS 89 73  BILITOT 0.8 1.0  PROT 7.9 6.6  ALBUMIN 4.4 3.4*   No results for input(s): LIPASE, AMYLASE in the last 168 hours. No results for input(s): AMMONIA in the last 168 hours. CBC: Recent Labs  Lab 08/05/20 0440 08/05/20 0949 08/05/20 1444 08/05/20 2237 08/06/20 0929  WBC 8.4  --   --   --   --   HGB 14.0 14.0 13.4 12.3* 12.7*  HCT 44.1 44.5 43.1 40.1 40.4  MCV 88.7  --   --   --   --   PLT 186  --   --   --   --    Cardiac Enzymes: No results for input(s): CKTOTAL, CKMB, CKMBINDEX, TROPONINI in the last 168 hours. BNP: Invalid input(s): POCBNP CBG: Recent Labs  Lab 08/05/20 1647 08/05/20 2150 08/06/20 0741  GLUCAP  226* 157* 129*   D-Dimer No results for input(s): DDIMER in the last 72 hours. Hgb A1c Recent Labs    08/05/20 1444  HGBA1C 8.5*   Lipid Profile No results for input(s): CHOL, HDL, LDLCALC, TRIG, CHOLHDL, LDLDIRECT in the last 72 hours. Thyroid function studies No results for input(s): TSH, T4TOTAL, T3FREE, THYROIDAB in the last 72 hours.  Invalid input(s): FREET3 Anemia work up No results for input(s): VITAMINB12, FOLATE, FERRITIN, TIBC, IRON, RETICCTPCT in the last 72 hours. Urinalysis    Component Value Date/Time   COLORURINE YELLOW 10/28/2019 1154   APPEARANCEUR CLEAR 10/28/2019 1154   LABSPEC 1.025 10/28/2019 1154   PHURINE 5.0 10/28/2019 1154   GLUCOSEU >=500 (A) 10/28/2019 1154   HGBUR SMALL (A) 10/28/2019 1154   BILIRUBINUR NEGATIVE 10/28/2019 1154   BILIRUBINUR neg 10/15/2012 1358   KETONESUR NEGATIVE 10/28/2019 1154   PROTEINUR NEGATIVE 10/28/2019 1154   UROBILINOGEN 1.0 05/19/2014 1814   NITRITE NEGATIVE 10/28/2019 1154   LEUKOCYTESUR NEGATIVE 10/28/2019 1154   Sepsis Labs Invalid input(s): PROCALCITONIN,  WBC,  LACTICIDVEN Microbiology Recent Results (from the past 240 hour(s))  Resp Panel by RT-PCR (Flu A&B, Covid) Nasopharyngeal Swab      Status: None   Collection Time: 08/05/20 11:42 AM   Specimen: Nasopharyngeal Swab; Nasopharyngeal(NP) swabs in vial transport medium  Result Value Ref Range Status   SARS Coronavirus 2 by RT PCR NEGATIVE NEGATIVE Final    Comment: (NOTE) SARS-CoV-2 target nucleic acids are NOT DETECTED.  The SARS-CoV-2 RNA is generally detectable in upper respiratory specimens during the acute phase of infection. The lowest concentration of SARS-CoV-2 viral copies this assay can detect is 138 copies/mL. A negative result does not preclude SARS-Cov-2 infection and should not be used as the sole basis for treatment or other patient management decisions. A negative result may occur with  improper specimen collection/handling, submission of specimen other than nasopharyngeal swab, presence of viral mutation(s) within the areas targeted by this assay, and inadequate number of viral copies(<138 copies/mL). A negative result must be combined with clinical observations, patient history, and epidemiological information. The expected result is Negative.  Fact Sheet for Patients:  EntrepreneurPulse.com.au  Fact Sheet for Healthcare Providers:  IncredibleEmployment.be  This test is no t yet approved or cleared by the [place] FDA and  has been authorized for detection and/or diagnosis of SARS-CoV-2 by FDA under an Emergency Use Authorization (EUA). This EUA will remain  in effect (meaning this test can be used) for the duration of the COVID-19 declaration under Section 564(b)(1) of the Act, 21 U.S.C.section 360bbb-3(b)(1), unless the authorization is terminated  or revoked sooner.       Influenza A by PCR NEGATIVE NEGATIVE Final   Influenza B by PCR NEGATIVE NEGATIVE Final    Comment: (NOTE) The Xpert Xpress SARS-CoV-2/FLU/RSV plus assay is intended as an aid in the diagnosis of influenza from Nasopharyngeal swab specimens and should not be used as a sole  basis for treatment. Nasal washings and aspirates are unacceptable for Xpert Xpress SARS-CoV-2/FLU/RSV testing.  Fact Sheet for Patients: EntrepreneurPulse.com.au  Fact Sheet for Healthcare Providers: IncredibleEmployment.be  This test is not yet approved or cleared by the [place] FDA and has been authorized for detection and/or diagnosis of SARS-CoV-2 by FDA under an Emergency Use Authorization (EUA). This EUA will remain in effect (meaning this test can be used) for the duration of the COVID-19 declaration under Section 564(b)(1) of the Act, 21 U.S.C. section 360bbb-3(b)(1), unless the authorization is terminated  or revoked.  Performed at [facility], [place] [address], [place], [place] [address]      Time coordinating discharge: 35 minutes  SIGNED:   Aline August, MD  [facility] 08/06/2020, 10:07 AM

## 2020-08-09 ENCOUNTER — Other Ambulatory Visit: Payer: Self-pay

## 2020-08-09 NOTE — Patient Outreach (Signed)
  [facility]) Care Management Chronic Special Needs Program    08/09/2020  Name: Matthew Zimmerman, DOB: [birth year]  MRN: [id number]   Matthew Zimmerman is enrolled in a chronic special needs plan for Diabetes.[facility] Management will continue to provide services for this member through 09/03/20.  The [facility] care management team will assume care 09/04/2020.   Quinn Plowman RN,BSN,CCM [facility] Care Management [phone]

## 2020-08-12 DIAGNOSIS — Z7901 Long term (current) use of anticoagulants: Secondary | ICD-10-CM | POA: Diagnosis not present

## 2020-08-12 DIAGNOSIS — M5412 Radiculopathy, cervical region: Secondary | ICD-10-CM | POA: Diagnosis not present

## 2020-08-12 DIAGNOSIS — Q2730 Arteriovenous malformation, site unspecified: Secondary | ICD-10-CM | POA: Diagnosis not present

## 2020-08-12 DIAGNOSIS — Z09 Encounter for follow-up examination after completed treatment for conditions other than malignant neoplasm: Secondary | ICD-10-CM | POA: Diagnosis not present

## 2020-08-12 DIAGNOSIS — K625 Hemorrhage of anus and rectum: Secondary | ICD-10-CM | POA: Diagnosis not present

## 2020-08-12 DIAGNOSIS — I2699 Other pulmonary embolism without acute cor pulmonale: Secondary | ICD-10-CM | POA: Diagnosis not present

## 2020-08-18 DIAGNOSIS — E1139 Type 2 diabetes mellitus with other diabetic ophthalmic complication: Secondary | ICD-10-CM | POA: Diagnosis not present

## 2020-08-18 DIAGNOSIS — Z794 Long term (current) use of insulin: Secondary | ICD-10-CM | POA: Diagnosis not present

## 2020-08-18 DIAGNOSIS — E7800 Pure hypercholesterolemia, unspecified: Secondary | ICD-10-CM | POA: Diagnosis not present

## 2020-08-19 DIAGNOSIS — R972 Elevated prostate specific antigen [PSA]: Secondary | ICD-10-CM | POA: Diagnosis not present

## 2020-08-19 DIAGNOSIS — N401 Enlarged prostate with lower urinary tract symptoms: Secondary | ICD-10-CM | POA: Diagnosis not present

## 2020-08-19 DIAGNOSIS — R338 Other retention of urine: Secondary | ICD-10-CM | POA: Diagnosis not present

## 2020-08-19 LAB — LIPID PANEL WITH LDL/HDL RATIO
Cholesterol, Total: 126 mg/dL (ref 100–199)
HDL: 54 mg/dL (ref >39)
LDL Chol Calc (NIH): 56 mg/dL (ref 0–99)
LDL/HDL Ratio: 1 ratio (ref 0.0–3.6)
Triglycerides: 84 mg/dL (ref 0–149)
VLDL Cholesterol Cal: 16 mg/dL (ref 5–40)

## 2020-08-20 ENCOUNTER — Encounter: Payer: Self-pay | Admitting: Cardiology

## 2020-08-20 ENCOUNTER — Ambulatory Visit: Payer: HMO | Admitting: Cardiology

## 2020-08-20 ENCOUNTER — Other Ambulatory Visit: Payer: Self-pay

## 2020-08-20 VITALS — BP 135/66 | HR 63 | Ht 68.5 in | Wt 200.0 lb

## 2020-08-20 DIAGNOSIS — E7800 Pure hypercholesterolemia, unspecified: Secondary | ICD-10-CM | POA: Diagnosis not present

## 2020-08-20 DIAGNOSIS — Z09 Encounter for follow-up examination after completed treatment for conditions other than malignant neoplasm: Secondary | ICD-10-CM

## 2020-08-20 DIAGNOSIS — Z794 Long term (current) use of insulin: Secondary | ICD-10-CM

## 2020-08-20 DIAGNOSIS — Z95828 Presence of other vascular implants and grafts: Secondary | ICD-10-CM | POA: Diagnosis not present

## 2020-08-20 DIAGNOSIS — I825Y3 Chronic embolism and thrombosis of unspecified deep veins of proximal lower extremity, bilateral: Secondary | ICD-10-CM | POA: Diagnosis not present

## 2020-08-20 DIAGNOSIS — I2510 Atherosclerotic heart disease of native coronary artery without angina pectoris: Secondary | ICD-10-CM | POA: Diagnosis not present

## 2020-08-20 DIAGNOSIS — I2699 Other pulmonary embolism without acute cor pulmonale: Secondary | ICD-10-CM

## 2020-08-20 DIAGNOSIS — R0609 Other forms of dyspnea: Secondary | ICD-10-CM | POA: Diagnosis not present

## 2020-08-20 DIAGNOSIS — I10 Essential (primary) hypertension: Secondary | ICD-10-CM

## 2020-08-20 DIAGNOSIS — E1139 Type 2 diabetes mellitus with other diabetic ophthalmic complication: Secondary | ICD-10-CM | POA: Diagnosis not present

## 2020-08-20 NOTE — Progress Notes (Signed)
Matthew Zimmerman Date of Birth: 09/01/1946 MRN: [id number] Primary Care Provider:Kim, Jeneen Rinks, MD Former Cardiology Providers: Jeri Lager, APRN, FNP-C Primary Cardiologist: Rex Kras, DO, [facility] (established care 03/09/2020)   Date: 08/20/20 Last Office Visit: 04/20/2020  Chief Complaint  Patient presents with  . Follow-up  . Hospitalization Follow-up    HPI  Matthew Zimmerman is a 74 y.o.  male who presents to the office with a chief complaint of " hospital follow-up." Patient's past medical history and cardiovascular risk factors include: Hypertension, type 2 diabetes mellitus with complications, hyperlipidemia, former smoker, history of DVT/PE and status post IVC filter on oral anticoagulation, coronary artery calcification by CT scan, advanced age.  Was previously followed by Miquel Dunn and reestablish care with myself back in July 2021 for management of hypertension and hyperlipidemia.  At the last office visit he was doing well from a cardiovascular standpoint his statin therapies were uptitrated and follow-up blood work from August 18, 2020 notes that his LDL is less than 70 mg/dL.  Patient presents today for follow-up after recent being hospitalized for rectal bleeding.  Patient is currently on oral anticoagulation due to his history of DVT / PE.  Patient states that he noticed blood-tinged stools and therefore was concerned and went to the hospital for further evaluation and management.  According to the discharge summary patient's hemoglobin remained stable and was seen by GI service.  Eliquis was restarted and aspirin is currently being held.  Since discharge patient states that he has not had any recurrence of his similar bowel movements.  He is overall stable.  He is tolerating his other medications well without any side effects or intolerances.  FUNCTIONAL STATUS: Limited after his recent DVT/PE episode. But tries to do treadmill atleast 20min per day.     ALLERGIES: Allergies  Allergen Reactions  . Cortisone Other (See Comments)    CBG thrown out of control  . Clotrimazole-Betamethasone Other (See Comments)     MEDICATION LIST PRIOR TO VISIT: Current Outpatient Medications on File Prior to Visit  Medication Sig Dispense Refill  . apixaban (ELIQUIS) 5 MG TABS tablet Take 1 tablet (5 mg total) by mouth 2 (two) times daily.    [name] atorvastatin (LIPITOR) 40 MG tablet Take 1 tablet (40 mg total) by mouth at bedtime. 90 tablet 1  . augmented betamethasone dipropionate (DIPROLENE-AF) 0.05 % cream Apply 1 application topically daily as needed (itchy skin near bottom).     . BD INSULIN SYRINGE U/F 31G X 5/16" 0.5 ML MISC 2 (two) times daily.    . clindamycin (CLEOCIN T) 1 % external solution Apply 1 application topically in the morning and at bedtime.    . clotrimazole-betamethasone (LOTRISONE) cream Apply 1 application topically 2 (two) times daily as needed (itching).     . dapagliflozin propanediol (FARXIGA) 5 MG TABS tablet Take 5 mg by mouth daily.    . diclofenac sodium (VOLTAREN) 1 % GEL Apply 1 application topically 4 (four) times daily as needed (pain).    [name] doxycycline (ADOXA) 100 MG tablet Take 100 mg by mouth daily.    . furosemide (LASIX) 80 MG tablet Take 0.5 tablets (40 mg total) by mouth daily. (Patient taking differently: Take 80 mg by mouth daily.)    . glucose blood test strip Use to test blood sugar twice a day    . insulin NPH-regular Human (NOVOLIN 70/30) (70-30) 100 UNIT/ML injection Inject 30 Units into the skin 2 (two) times daily with a meal.     .  Lancets (ONETOUCH DELICA PLUS [id number]) MISC Use to test blood sugar twice daily    . metoprolol succinate (TOPROL-XL) 25 MG 24 hr tablet TAKE 1 TABLET BY MOUTH WITH OR AFTER DINNER DAILY (Patient taking differently: Take 25 mg by mouth every evening.) 90 tablet 0  . vitamin B-12 (CYANOCOBALAMIN) 500 MCG tablet Take 500 mcg by mouth daily.     . Vitamin D, Ergocalciferol,  (DRISDOL) 1.25 MG (50000 UNIT) CAPS capsule Take 50,000 Units by mouth every Sunday.      No current facility-administered medications on file prior to visit.    PAST MEDICAL HISTORY: Past Medical History:  Diagnosis Date  . Carpal tunnel syndrome on both sides   . Chest pain   . Coronary artery calcification seen on CAT scan   . Diabetes mellitus without complication ([place])   . DVT (deep venous thrombosis) ([place])   . GERD (gastroesophageal reflux disease)   . Headache   . Hypercholesteremia   . Hyperlipidemia   . Hypertension   . PE (pulmonary embolism)   . Shortness of breath dyspnea    PE     PAST SURGICAL HISTORY: Past Surgical History:  Procedure Laterality Date  . BILATERAL CARPAL TUNNEL RELEASE Bilateral   . CATARACT EXTRACTION W/PHACO Left 08/05/2006   Dr. Gershon Crane  . CATARACT EXTRACTION W/PHACO Right 08/18/2006   Dr. Gershon Crane  . COLONOSCOPY    . COLONOSCOPY WITH PROPOFOL Left 09/20/2019   Procedure: COLONOSCOPY WITH PROPOFOL;  Surgeon: Ronnette Juniper, MD;  Location: [facility];  Service: Gastroenterology;  Laterality: Left;  . COLONOSCOPY WITH PROPOFOL N/A 10/03/2019   Procedure: COLONOSCOPY WITH PROPOFOL;  Surgeon: Wilford Corner, MD;  Location: [facility];  Service: Endoscopy;  Laterality: N/A;  . ELBOW SURGERY     BONE SPURS REMOVED B ELBOWS  . EYE SURGERY Bilateral    cataracts  . FOOT SURGERY     HEEL SPURS REMOVED B HEELS  . HEMOSTASIS CLIP PLACEMENT  09/20/2019   Procedure: HEMOSTASIS CLIP PLACEMENT;  Surgeon: Ronnette Juniper, MD;  Location: [facility];  Service: Gastroenterology;;  . HEMOSTASIS CLIP PLACEMENT  10/03/2019   Procedure: HEMOSTASIS CLIP PLACEMENT;  Surgeon: Wilford Corner, MD;  Location: [facility];  Service: Endoscopy;;  . HOT HEMOSTASIS N/A 09/20/2019   Procedure: HOT HEMOSTASIS (ARGON PLASMA COAGULATION/BICAP);  Surgeon: Ronnette Juniper, MD;  Location: [facility];  Service: Gastroenterology;  Laterality: N/A;  . I & D EXTREMITY Right  09/18/2014   Procedure: IRRIGATION AND DEBRIDEMENT RIGHT THUMB, open fracture repair;  Surgeon: Linna Hoff, MD;  Location: [facility];  Service: Orthopedics;  Laterality: Right;  . IR IVC FILTER PLMT / S&I Burke Keels GUID/MOD SED  10/03/2019  . LEG SURGERY Left    left thigh table saw accident, "cut to the bone", I&D/suturing  . SHOULDER SURGERY Left    Bone spur  . SPINE SURGERY  09/04/1985   lumbar spine  . SUBMUCOSAL INJECTION  10/03/2019   Procedure: SUBMUCOSAL INJECTION;  Surgeon: Wilford Corner, MD;  Location: [facility];  Service: Endoscopy;;  . TOE SURGERY      FAMILY HISTORY: The patient's family history includes Cancer in his mother; Diabetes in his father.   SOCIAL HISTORY:  The patient  reports that he quit smoking about 58 years ago. His smoking use included cigarettes. He smoked 0.50 packs per day. He has quit using smokeless tobacco. He reports that he does not drink alcohol and does not use drugs.  Review of Systems  Constitutional: Negative for chills and  fever.  HENT: Negative for hoarse voice and nosebleeds.   Eyes: Negative for discharge, double vision and pain.  Cardiovascular: Positive for dyspnea on exertion (chronic and stable). Negative for chest pain, claudication, leg swelling, near-syncope, orthopnea, palpitations, paroxysmal nocturnal dyspnea and syncope.  Respiratory: Negative for hemoptysis and shortness of breath.   Musculoskeletal: Negative for muscle cramps and myalgias.  Gastrointestinal: Negative for abdominal pain, constipation, diarrhea, hematemesis, hematochezia, melena, nausea and vomiting.  Neurological: Negative for dizziness and light-headedness.    PHYSICAL EXAM: Vitals with BMI 08/20/2020 08/06/2020 08/05/2020  Height 5' 8.5" - -  Weight 200 lbs - -  BMI 28.41 - -  Systolic 324 401 027  Diastolic 66 65 67  Pulse 63 51 58   CONSTITUTIONAL: Well-developed and well-nourished. No acute distress.  SKIN: Skin is warm and dry. No rash noted. No  cyanosis. No pallor. No jaundice HEAD: Normocephalic and atraumatic.  EYES: No scleral icterus MOUTH/THROAT: Moist oral membranes.  NECK: No JVD present. No thyromegaly noted. No carotid bruits  LYMPHATIC: No visible cervical adenopathy.  CHEST Normal respiratory effort. No intercostal retractions  LUNGS: Clear to auscultation bilaterally.  No stridor. No wheezes. No rales.  CARDIOVASCULAR: Regular rate and rhythm, positive S1-S2, no murmurs rubs or gallops appreciated. ABDOMINAL: Obese, soft, nontender, nondistended, positive bowel sounds in all 4 quadrants.  No apparent ascites.  EXTREMITIES: No peripheral edema.  2+ bilateral femoral pulses, 1+ bilateral popliteal and dorsalis pedis pulses. HEMATOLOGIC: No significant bruising NEUROLOGIC: Oriented to person, place, and time. Nonfocal. Normal muscle tone.  PSYCHIATRIC: Normal mood and affect. Normal behavior. Cooperative  RADIOLOGY: CT of abdomen 01/16/2018:   Aortic atherosclerosis, in addition to least 2 vessel coronary artery disease.   There are calcifications of the aortic valve.   CTA of chest 02/25/2019: Small pulmonary embolus in right lower lobe pulmonary arterial branches.   CARDIAC DATABASE: EKG: 04/09/2019: Sinus bradycardia at 56 bpm, normal axis, RBBB. 1 PVC. Abnormal EKG  Echocardiogram: 05/05/2019: LVEF 55%, mild LVH, grade 1 diastolic impairment, no significant valvular heart disease.  Stress Testing:  Lexiscan (Walking with mod Bruce)Tetrofosmin Stress Test  03/23/2020: Nondiagnostic ECG stress. Resting EKG/ECG demonstrated normal sinus rhythm. Left QRS axis deviation in the presence of right bundle branch block. Non-specific ST-T abnormality. Resting ECG revealed premature ventricular contractions. PVC and V- Bigeminy persisted throughout the lexiscan stress with mod-Bruce protocol. No additional ST-T changes from baseline.  There was diaphragmatic attenuation in inferior wall, otherwise myocardial perfusion is  normal. Overall LV systolic function is normal without regional wall motion abnormalities. There is a fixed mild defect in the inferior region.  Stress LV EF: 60%.  No previous exam available for comparison. Low risk.   Heart Catheterization: None  IVC/Iliac Study 10/14/2019: Abdominal Aorta: There is evidence of abnormal dilatation of the distal Abdominal aorta. IVC/Iliac: There is evidence of acute thrombus involving the IVC. There is evidence of acute thrombus involving the right common iliac vein, left common iliac vein, right external iliac vein, left external iliac vein. Visualization of proximal Inferior Vena Cava was limited.  US Venous duplex 10/14/2019: RIGHT:  - Findings consistent with acute deep vein thrombosis involving the right common femoral vein, SF junction, right femoral vein, right proximal  profunda vein, right popliteal vein, right posterior tibial veins, right peroneal veins, and right gastrocnemius veins.  - No cystic structure found in the popliteal fossa.    LEFT:  - Findings consistent with acute deep vein thrombosis involving the left common femoral vein,  SF junction, left femoral vein, left proximal profunda vein, left popliteal vein, and left gastrocnemius veins.  - No cystic structure found in the popliteal fossa.   LABORATORY DATA: CBC Latest Ref Rng & Units 08/06/2020 08/05/2020 08/05/2020  WBC 4.0 - 10.5 K/uL - - -  Hemoglobin 13.0 - 17.0 g/dL 12.7(L) 12.3(L) 13.4  Hematocrit 39.0 - 52.0 % 40.4 40.1 43.1  Platelets 150 - 400 K/uL - - -    CMP Latest Ref Rng & Units 08/06/2020 08/05/2020 10/28/2019  Glucose 70 - 99 mg/dL 139(H) 127(H) 272(H)  BUN 8 - 23 mg/dL 15 26(H) 15  Creatinine 0.61 - 1.24 mg/dL 1.19 1.48(H) 1.15  Sodium 135 - 145 mmol/L 140 142 139  Potassium 3.5 - 5.1 mmol/L 3.8 3.5 4.3  Chloride 98 - 111 mmol/L 109 107 108  CO2 22 - 32 mmol/L 23 24 16(L)  Calcium 8.9 - 10.3 mg/dL 8.5(L) 9.3 9.4  Total Protein 6.5 - 8.1 g/dL 6.6 7.9 7.4   Total Bilirubin 0.3 - 1.2 mg/dL 1.0 0.8 1.0  Alkaline Phos 38 - 126 U/L 73 89 79  AST 15 - 41 U/L 19 24 28   ALT 0 - 44 U/L 23 31 26     Lipid Panel     Component Value Date/Time   CHOL 126 08/18/2020 0908   TRIG 84 08/18/2020 0908   HDL 54 08/18/2020 0908   LDLCALC 56 08/18/2020 0908   LABVLDL 16 08/18/2020 0908    Lab Results  Component Value Date   HGBA1C 8.5 (H) 08/05/2020   HGBA1C 7.5 (H) 09/19/2019   HGBA1C 7.2 (H) 05/20/2014   No components found for: NTPROBNP Lab Results  Component Value Date   TSH 6.373 (H) 10/13/2019   TSH 1.36 05/12/2010   IMPRESSION:  No diagnosis found.   RECOMMENDATIONS: Matthew Zimmerman is a 74 y.o. male whose past medical history and cardiovascular risk factors include: Hypertension, type 2 diabetes mellitus with complications, hyperlipidemia, former smoker, history of DVT/PE and status post IVC filter on oral anticoagulation, coronary artery calcification by CT scan, advanced age.  Dyspnea on exertion: Chronic and stable.  Patient symptoms of dyspnea are relatively stable.    In the recent past he has undergone an echocardiogram and stress test.  Stress test was noted to be low risk study.  Patient's medications have been uptitrated and his symptoms have improved.  Continue to monitor.    Benign essential hypertension with chronic kidney disease stage II . Office blood pressure is at goal.  . Medication reconciled.  . Low salt diet recommended. A diet that is rich in fruits, vegetables, legumes, and low-fat dairy products and low in snacks, sweets, and meats (such as the Dietary Approaches to Stop Hypertension [DASH] diet).   Hyperlipidemia, mixed:  Most recent lipid profile from December 2021 independently reviewed with the patient.  Since he is a diabetic would recommend an LDL less than 70 mg/dL.  At last office visit his Lipitor was increased to 40 mg p.o. nightly.  As result his LDL is now at goal.  Continue to monitor.     Coronary artery calcification:   Continue statin therapy.  Patient was on aspirin 81 mg p.o. daily.  But currently is being held due to his recent ER visit.    Given his risk factors I think is very appropriate to retrial aspirin 81 mg p.o. daily 1 month after his discharge.  Patient is asked to restart aspirin as of September 06, 2020 with close observation  for any signs of bleeding.    Insulin-dependent diabetes mellitus type 2 with complications of retinopathy and neuropathy: Patient educated on importance of glycemic control given his multiple cardiovascular risk factors.   FINAL MEDICATION LIST END OF ENCOUNTER: No orders of the defined types were placed in this encounter.   There are no discontinued medications.   Current Outpatient Medications:  .  apixaban (ELIQUIS) 5 MG TABS tablet, Take 1 tablet (5 mg total) by mouth 2 (two) times daily., Disp: , Rfl:  .  atorvastatin (LIPITOR) 40 MG tablet, Take 1 tablet (40 mg total) by mouth at bedtime., Disp: 90 tablet, Rfl: 1 .  augmented betamethasone dipropionate (DIPROLENE-AF) 0.05 % cream, Apply 1 application topically daily as needed (itchy skin near bottom). , Disp: , Rfl:  .  BD INSULIN SYRINGE U/F 31G X 5/16" 0.5 ML MISC, 2 (two) times daily., Disp: , Rfl:  .  clindamycin (CLEOCIN T) 1 % external solution, Apply 1 application topically in the morning and at bedtime., Disp: , Rfl:  .  clotrimazole-betamethasone (LOTRISONE) cream, Apply 1 application topically 2 (two) times daily as needed (itching). , Disp: , Rfl:  .  dapagliflozin propanediol (FARXIGA) 5 MG TABS tablet, Take 5 mg by mouth daily., Disp: , Rfl:  .  diclofenac sodium (VOLTAREN) 1 % GEL, Apply 1 application topically 4 (four) times daily as needed (pain)., Disp: , Rfl:  .  doxycycline (ADOXA) 100 MG tablet, Take 100 mg by mouth daily., Disp: , Rfl:  .  furosemide (LASIX) 80 MG tablet, Take 0.5 tablets (40 mg total) by mouth daily. (Patient taking differently: Take 80 mg  by mouth daily.), Disp: , Rfl:  .  glucose blood test strip, Use to test blood sugar twice a day, Disp: , Rfl:  .  insulin NPH-regular Human (NOVOLIN 70/30) (70-30) 100 UNIT/ML injection, Inject 30 Units into the skin 2 (two) times daily with a meal. , Disp: , Rfl:  .  Lancets (ONETOUCH DELICA PLUS [id number]) MISC, Use to test blood sugar twice daily, Disp: , Rfl:  .  metoprolol succinate (TOPROL-XL) 25 MG 24 hr tablet, TAKE 1 TABLET BY MOUTH WITH OR AFTER DINNER DAILY (Patient taking differently: Take 25 mg by mouth every evening.), Disp: 90 tablet, Rfl: 0 .  vitamin B-12 (CYANOCOBALAMIN) 500 MCG tablet, Take 500 mcg by mouth daily. , Disp: , Rfl:  .  Vitamin D, Ergocalciferol, (DRISDOL) 1.25 MG (50000 UNIT) CAPS capsule, Take 50,000 Units by mouth every Sunday. , Disp: , Rfl:   No orders of the defined types were placed in this encounter.  --Continue cardiac medications as reconciled in final medication list. --Return in about 6 months (around 02/18/2021) for Follow up, Dyspnea, Lipid. Or sooner if needed. --Continue follow-up with your primary care physician regarding the management of your other chronic comorbid conditions.  Patient's questions and concerns were addressed to his satisfaction. He voices understanding of the instructions provided during this encounter.   This note was created using a voice recognition software as a result there may be grammatical errors inadvertently enclosed that do not reflect the nature of this encounter. Every attempt is made to correct such errors.  Total time spent: 30 minutes discussing the recent hospitalization, reviewing discharge summary, medication reconciliation, discussing disease management and current medical therapy.  Rex Kras, [place], [facility]  Pager: [phone] Office: [phone]

## 2020-08-23 DIAGNOSIS — K625 Hemorrhage of anus and rectum: Secondary | ICD-10-CM | POA: Diagnosis not present

## 2020-09-07 ENCOUNTER — Other Ambulatory Visit: Payer: Self-pay

## 2020-09-16 DIAGNOSIS — E118 Type 2 diabetes mellitus with unspecified complications: Secondary | ICD-10-CM | POA: Diagnosis not present

## 2020-09-23 DIAGNOSIS — I2510 Atherosclerotic heart disease of native coronary artery without angina pectoris: Secondary | ICD-10-CM | POA: Diagnosis not present

## 2020-09-23 DIAGNOSIS — E118 Type 2 diabetes mellitus with unspecified complications: Secondary | ICD-10-CM | POA: Diagnosis not present

## 2020-09-23 DIAGNOSIS — Z6829 Body mass index (BMI) 29.0-29.9, adult: Secondary | ICD-10-CM | POA: Diagnosis not present

## 2020-09-23 DIAGNOSIS — Z794 Long term (current) use of insulin: Secondary | ICD-10-CM | POA: Diagnosis not present

## 2020-09-23 DIAGNOSIS — E785 Hyperlipidemia, unspecified: Secondary | ICD-10-CM | POA: Diagnosis not present

## 2020-09-23 DIAGNOSIS — I10 Essential (primary) hypertension: Secondary | ICD-10-CM | POA: Diagnosis not present

## 2020-09-23 DIAGNOSIS — E1165 Type 2 diabetes mellitus with hyperglycemia: Secondary | ICD-10-CM | POA: Diagnosis not present

## 2020-10-15 ENCOUNTER — Other Ambulatory Visit: Payer: Self-pay | Admitting: Cardiology

## 2020-10-15 DIAGNOSIS — E7800 Pure hypercholesterolemia, unspecified: Secondary | ICD-10-CM

## 2020-10-26 ENCOUNTER — Other Ambulatory Visit: Payer: Self-pay | Admitting: Cardiology

## 2020-11-01 ENCOUNTER — Ambulatory Visit: Payer: HMO | Admitting: Cardiology

## 2020-11-01 ENCOUNTER — Encounter: Payer: Self-pay | Admitting: Cardiology

## 2020-11-01 ENCOUNTER — Other Ambulatory Visit: Payer: Self-pay

## 2020-11-01 VITALS — BP 131/65 | HR 61 | Temp 97.8°F | Resp 16 | Ht 68.0 in | Wt 203.0 lb

## 2020-11-01 DIAGNOSIS — E1139 Type 2 diabetes mellitus with other diabetic ophthalmic complication: Secondary | ICD-10-CM | POA: Diagnosis not present

## 2020-11-01 DIAGNOSIS — Z86711 Personal history of pulmonary embolism: Secondary | ICD-10-CM | POA: Diagnosis not present

## 2020-11-01 DIAGNOSIS — R0609 Other forms of dyspnea: Secondary | ICD-10-CM

## 2020-11-01 DIAGNOSIS — I4510 Unspecified right bundle-branch block: Secondary | ICD-10-CM

## 2020-11-01 DIAGNOSIS — R0600 Dyspnea, unspecified: Secondary | ICD-10-CM

## 2020-11-01 DIAGNOSIS — I825Y3 Chronic embolism and thrombosis of unspecified deep veins of proximal lower extremity, bilateral: Secondary | ICD-10-CM | POA: Diagnosis not present

## 2020-11-01 DIAGNOSIS — Z794 Long term (current) use of insulin: Secondary | ICD-10-CM

## 2020-11-01 DIAGNOSIS — I2510 Atherosclerotic heart disease of native coronary artery without angina pectoris: Secondary | ICD-10-CM

## 2020-11-01 DIAGNOSIS — I10 Essential (primary) hypertension: Secondary | ICD-10-CM

## 2020-11-01 DIAGNOSIS — Z95828 Presence of other vascular implants and grafts: Secondary | ICD-10-CM | POA: Diagnosis not present

## 2020-11-01 DIAGNOSIS — E7800 Pure hypercholesterolemia, unspecified: Secondary | ICD-10-CM

## 2020-11-01 NOTE — Progress Notes (Signed)
Matthew Zimmerman Date of Birth: [birth year] MRN: [id number] Primary Care Provider:Kim, Matthew Zimmerman Former Cardiology Providers: Matthew Zimmerman Primary Cardiologist: Matthew Zimmerman, [facility] (established care 03/09/2020)   Date: 11/01/20 Last Office Visit: 08/20/2020  Chief Complaint  Patient presents with  . Shortness of Breath  . Hypertension  . Follow-up    6 month    HPI  Matthew Zimmerman is a 75 y.o.  male who presents to the office with a chief complaint of " follow-up for shortness of breath and blood pressure." Patient's past medical history and cardiovascular risk factors include: Hypertension, type 2 diabetes mellitus with complications, hyperlipidemia, former smoker, history of DVT/PE and status post IVC filter on oral anticoagulation, coronary artery calcification by CT scan, advanced age.  Patient is being followed by our practice for the management of hypertension and hyperlipidemia.  Since last office visit patient states that he is doing well from a cardiovascular standpoint.  No hospitalizations or urgent care visits for cardiovascular symptoms.  Patient is tolerating his Eliquis which he takes for DVT/PE and prescribed by his other providers.  He has not had any more rectal bleeding since last office visit.  Patient's most recent lipid profile notes an LDL less than 70 mg/dL.  Patient is tolerating his medications well without any side effects or intolerances.  Patient states that his shortness of breath with effort related activities remains stable.  He is not volume overloaded.  And denies heart failure symptoms.   FUNCTIONAL STATUS: Limited after his recent DVT/PE episode. But tries to Zimmerman treadmill atleast 63min per day.    ALLERGIES: Allergies  Allergen Reactions  . Cortisone Other (See Comments)    CBG thrown out of control  . Clotrimazole-Betamethasone Other (See Comments)     MEDICATION LIST PRIOR TO VISIT: Current Outpatient Medications  on File Prior to Visit  Medication Sig Dispense Refill  . acetaminophen (TYLENOL) 650 MG CR tablet as needed.    [name] apixaban (ELIQUIS) 5 MG TABS tablet Take 1 tablet (5 mg total) by mouth 2 (two) times daily.    [name] atorvastatin (LIPITOR) 40 MG tablet TAKE 1 TABLET(40 MG) BY MOUTH AT BEDTIME 90 tablet 1  . augmented betamethasone dipropionate (DIPROLENE-AF) 0.05 % cream Apply 1 application topically daily as needed (itchy skin near bottom).     . BD INSULIN SYRINGE U/F 31G X 5/16" 0.5 ML MISC 2 (two) times daily.    . clindamycin (CLEOCIN T) 1 % external solution Apply 1 application topically in the morning and at bedtime.    . clotrimazole-betamethasone (LOTRISONE) cream Apply 1 application topically 2 (two) times daily as needed (itching).     . dapagliflozin propanediol (FARXIGA) 5 MG TABS tablet Take 5 mg by mouth daily.    . diclofenac sodium (VOLTAREN) 1 % GEL Apply 1 application topically 4 (four) times daily as needed (pain).    [name] doxycycline (ADOXA) 100 MG tablet Take 100 mg by mouth daily.    . furosemide (LASIX) 80 MG tablet Take 0.5 tablets (40 mg total) by mouth daily. (Patient taking differently: Take 80 mg by mouth daily.)    . glucose blood test strip Use to test blood sugar twice a day    . insulin NPH-regular Human (NOVOLIN 70/30) (70-30) 100 UNIT/ML injection Inject 30 Units into the skin 2 (two) times daily with a meal.     . Lancets (ONETOUCH DELICA PLUS [id number]) MISC Use to test blood sugar twice daily    . metoprolol  succinate (TOPROL-XL) 25 MG 24 hr tablet TAKE 1 TABLET(25 MG) BY MOUTH DAILY WITH OR IMMEDIATELY FOLLOWING A MEAL 90 tablet 0  . NON FORMULARY Take 1 tablet by mouth at bedtime. HeartZest    . NON FORMULARY Take 1 tablet by mouth daily. CardioRelax AO    . vitamin B-12 (CYANOCOBALAMIN) 500 MCG tablet Take 500 mcg by mouth daily.     . Vitamin D, Ergocalciferol, (DRISDOL) 1.25 MG (50000 UNIT) CAPS capsule Take 50,000 Units by mouth every Sunday.      No current  facility-administered medications on file prior to visit.    PAST MEDICAL HISTORY: Past Medical History:  Diagnosis Date  . Carpal tunnel syndrome on both sides   . Chest pain   . Coronary artery calcification seen on CAT scan   . Diabetes mellitus without complication ([place])   . DVT (deep venous thrombosis) ([place])   . GERD (gastroesophageal reflux disease)   . Headache   . Hypercholesteremia   . Hyperlipidemia   . Hypertension   . PE (pulmonary embolism)   . Shortness of breath dyspnea    PE     PAST SURGICAL HISTORY: Past Surgical History:  Procedure Laterality Date  . BILATERAL CARPAL TUNNEL RELEASE Bilateral   . CATARACT EXTRACTION W/PHACO Left 08/05/2006   Matthew Zimmerman  . CATARACT EXTRACTION W/PHACO Right 08/18/2006   Matthew Zimmerman  . COLONOSCOPY    . COLONOSCOPY WITH PROPOFOL Left 09/20/2019   Procedure: COLONOSCOPY WITH PROPOFOL;  Surgeon: Matthew Zimmerman;  Location: [facility];  Service: Gastroenterology;  Laterality: Left;  . COLONOSCOPY WITH PROPOFOL N/A 10/03/2019   Procedure: COLONOSCOPY WITH PROPOFOL;  Surgeon: Matthew Zimmerman;  Location: [facility];  Service: Endoscopy;  Laterality: N/A;  . ELBOW SURGERY     BONE SPURS REMOVED B ELBOWS  . EYE SURGERY Bilateral    cataracts  . FOOT SURGERY     HEEL SPURS REMOVED B HEELS  . HEMOSTASIS CLIP PLACEMENT  09/20/2019   Procedure: HEMOSTASIS CLIP PLACEMENT;  Surgeon: Matthew Zimmerman;  Location: [facility];  Service: Gastroenterology;;  . HEMOSTASIS CLIP PLACEMENT  10/03/2019   Procedure: HEMOSTASIS CLIP PLACEMENT;  Surgeon: Matthew Zimmerman;  Location: [facility];  Service: Endoscopy;;  . HOT HEMOSTASIS N/A 09/20/2019   Procedure: HOT HEMOSTASIS (ARGON PLASMA COAGULATION/BICAP);  Surgeon: Matthew Zimmerman;  Location: [facility];  Service: Gastroenterology;  Laterality: N/A;  . I & D EXTREMITY Right 09/18/2014   Procedure: IRRIGATION AND DEBRIDEMENT RIGHT THUMB, open fracture repair;  Surgeon: Matthew Zimmerman;  Location: [facility];  Service: Orthopedics;  Laterality: Right;  . IR IVC FILTER PLMT / S&I Burke Keels GUID/MOD SED  10/03/2019  . LEG SURGERY Left    left thigh table saw accident, "cut to the bone", I&D/suturing  . SHOULDER SURGERY Left    Bone spur  . SPINE SURGERY  09/04/1985   lumbar spine  . SUBMUCOSAL INJECTION  10/03/2019   Procedure: SUBMUCOSAL INJECTION;  Surgeon: Matthew Zimmerman;  Location: [facility];  Service: Endoscopy;;  . TOE SURGERY      FAMILY HISTORY: The patient's family history includes Cancer in his mother; Diabetes in his father.   SOCIAL HISTORY:  The patient  reports that he quit smoking about 58 years ago. His smoking use included cigarettes. He smoked 0.50 packs per day. He has quit using smokeless tobacco. He reports that he does not drink alcohol and does not use drugs.  Review of Systems  Constitutional: Negative  for chills and fever.  HENT: Negative for hoarse voice and nosebleeds.   Eyes: Negative for discharge, double vision and pain.  Cardiovascular: Positive for dyspnea on exertion (chronic and stable). Negative for chest pain, claudication, leg swelling, near-syncope, orthopnea, palpitations, paroxysmal nocturnal dyspnea and syncope.  Respiratory: Negative for hemoptysis and shortness of breath.   Musculoskeletal: Negative for muscle cramps and myalgias.  Gastrointestinal: Negative for abdominal pain, constipation, diarrhea, hematemesis, hematochezia, melena, nausea and vomiting.  Neurological: Negative for dizziness and light-headedness.    PHYSICAL EXAM: Vitals with BMI 11/01/2020 08/20/2020 08/06/2020  Height 5\' 8"  5' 8.5" -  Weight 203 lbs 200 lbs -  BMI 42.68 34.19 -  Systolic 622 297 989  Diastolic 65 66 65  Pulse 61 63 51   CONSTITUTIONAL: Well-developed and well-nourished. No acute distress.  SKIN: Skin is warm and dry. No rash noted. No cyanosis. No pallor. No jaundice HEAD: Normocephalic and atraumatic.  EYES: No scleral  icterus MOUTH/THROAT: Moist oral membranes.  NECK: No JVD present. No thyromegaly noted. No carotid bruits  LYMPHATIC: No visible cervical adenopathy.  CHEST Normal respiratory effort. No intercostal retractions  LUNGS: Clear to auscultation bilaterally.  No stridor. No wheezes. No rales.  CARDIOVASCULAR: Regular rate and rhythm, positive S1-S2, no murmurs rubs or gallops appreciated. ABDOMINAL: Obese, soft, nontender, nondistended, positive bowel sounds in all 4 quadrants.  No apparent ascites.  EXTREMITIES: No peripheral edema.  2+ bilateral femoral pulses, 1+ bilateral popliteal and dorsalis pedis pulses. HEMATOLOGIC: No significant bruising NEUROLOGIC: Oriented to person, place, and time. Nonfocal. Normal muscle tone.  PSYCHIATRIC: Normal mood and affect. Normal behavior. Cooperative  RADIOLOGY: CT of abdomen 01/16/2018:   Aortic atherosclerosis, in addition to least 2 vessel coronary artery disease.   There are calcifications of the aortic valve.   CTA of chest 02/25/2019: Small pulmonary embolus in right lower lobe pulmonary arterial branches.   CARDIAC DATABASE: EKG: 11/01/2020: Sinus bradycardia, 51 bpm, first-degree AV block, right bundle branch block, without underlying injury pattern.    Echocardiogram: 05/05/2019: LVEF 55%, mild LVH, grade 1 diastolic impairment, no significant valvular heart disease.  Stress Testing:  Lexiscan (Walking with mod Bruce)Tetrofosmin Stress Test  03/23/2020: Nondiagnostic ECG stress. Resting EKG/ECG demonstrated normal sinus rhythm. Left QRS axis deviation in the presence of right bundle branch block. Non-specific ST-T abnormality. Resting ECG revealed premature ventricular contractions. PVC and V- Bigeminy persisted throughout the lexiscan stress with mod-Bruce protocol. No additional ST-T changes from baseline.  There was diaphragmatic attenuation in inferior wall, otherwise myocardial perfusion is normal. Overall LV systolic function is  normal without regional wall motion abnormalities. There is a fixed mild defect in the inferior region.  Stress LV EF: 60%.  No previous exam available for comparison. Low risk.   Heart Catheterization: None  IVC/Iliac Study 10/14/2019: Abdominal Aorta: There is evidence of abnormal dilatation of the distal Abdominal aorta. IVC/Iliac: There is evidence of acute thrombus involving the IVC. There is evidence of acute thrombus involving the right common iliac vein, left common iliac vein, right external iliac vein, left external iliac vein. Visualization of proximal Inferior Vena Cava was limited.  US Venous duplex 10/14/2019: RIGHT:  - Findings consistent with acute deep vein thrombosis involving the right common femoral vein, SF junction, right femoral vein, right proximal  profunda vein, right popliteal vein, right posterior tibial veins, right peroneal veins, and right gastrocnemius veins.  - No cystic structure found in the popliteal fossa.    LEFT:  - Findings consistent with  acute deep vein thrombosis involving the left common femoral vein, SF junction, left femoral vein, left proximal profunda vein, left popliteal vein, and left gastrocnemius veins.  - No cystic structure found in the popliteal fossa.   LABORATORY DATA: CBC Latest Ref Rng & Units 08/06/2020 08/05/2020 08/05/2020  WBC 4.0 - 10.5 K/uL - - -  Hemoglobin 13.0 - 17.0 g/dL 12.7(L) 12.3(L) 13.4  Hematocrit 39.0 - 52.0 % 40.4 40.1 43.1  Platelets 150 - 400 K/uL - - -    CMP Latest Ref Rng & Units 08/06/2020 08/05/2020 10/28/2019  Glucose 70 - 99 mg/dL 139(H) 127(H) 272(H)  BUN 8 - 23 mg/dL 15 26(H) 15  Creatinine 0.61 - 1.24 mg/dL 1.19 1.48(H) 1.15  Sodium 135 - 145 mmol/L 140 142 139  Potassium 3.5 - 5.1 mmol/L 3.8 3.5 4.3  Chloride 98 - 111 mmol/L 109 107 108  CO2 22 - 32 mmol/L 23 24 16(L)  Calcium 8.9 - 10.3 mg/dL 8.5(L) 9.3 9.4  Total Protein 6.5 - 8.1 g/dL 6.6 7.9 7.4  Total Bilirubin 0.3 - 1.2 mg/dL 1.0 0.8 1.0   Alkaline Phos 38 - 126 U/L 73 89 79  AST 15 - 41 U/L 19 24 28   ALT 0 - 44 U/L 23 31 26     Lipid Panel     Component Value Date/Time   CHOL 126 08/18/2020 0908   TRIG 84 08/18/2020 0908   HDL 54 08/18/2020 0908   LDLCALC 56 08/18/2020 0908   LABVLDL 16 08/18/2020 0908    Lab Results  Component Value Date   HGBA1C 8.5 (H) 08/05/2020   HGBA1C 7.5 (H) 09/19/2019   HGBA1C 7.2 (H) 05/20/2014   No components found for: NTPROBNP Lab Results  Component Value Date   TSH 6.373 (H) 10/13/2019   TSH 1.36 05/12/2010   IMPRESSION:    ICD-10-CM   1. Dyspnea on exertion  R06.00   2. RBBB  I45.10   3. Essential hypertension  I10 EKG [id number]    Basic metabolic panel    Magnesium  4. Hypercholesterolemia  E78.00 LDL cholesterol, direct    Lipid Panel With LDL/HDL Ratio  5. Coronary artery calcification seen on CT scan  I25.10   6. Type 2 diabetes mellitus with other ophthalmic complication, with long-term current use of insulin (HCC)  E11.39    Z79.4   7. Long-term insulin use (HCC)  Z79.4   8. Hx pulmonary embolism  Z86.711   9. Chronic deep vein thrombosis (DVT) of proximal vein of both lower extremities (HCC)  I82.5Y3   10. Presence of IVC filter  Z95.828      RECOMMENDATIONS: Matthew Zimmerman is a 75 y.o. male whose past medical history and cardiovascular risk factors include: Hypertension, type 2 diabetes mellitus with complications, hyperlipidemia, former smoker, history of DVT/PE and status post IVC filter on oral anticoagulation, coronary artery calcification by CT scan, advanced age.  Dyspnea on exertion: Chronic and stable.  Symptoms remain stable since last office visit.  He is overall euvolemic and not in congestive heart failure.  He has had a recent echocardiogram and stress test which were reviewed during today's encounter.  Benign essential hypertension with chronic kidney disease stage II . Office blood pressure is at goal.  . Medication reconciled.  . Low  salt diet recommended. A diet that is rich in fruits, vegetables, legumes, and low-fat dairy products and low in snacks, sweets, and meats (such as the Dietary Approaches to Stop Hypertension [DASH] diet).  . We  will check labs prior to the next office visit as he is on diuretic therapy.  Hyperlipidemia, mixed:  Most recent lipid profile from December 2021 independently reviewed with the patient.  Since he is a diabetic would recommend an LDL less than 70 mg/dL.  Patient is tolerating Lipitor 40 mg p.o. nightly.  We will repeat a fasting lipid profile prior to the next office visit.  Coronary artery calcification:   Continue statin therapy.  Given his prior history of rectal bleeding which required a visit to the ER this shared decision was to continue with his Eliquis which he takes for recurrent DVT/PE and to hold off on aspirin 81 mg p.o. daily.    Insulin-dependent diabetes mellitus type 2 with complications of retinopathy and neuropathy: Patient educated on importance of glycemic control given his multiple cardiovascular risk factors.  FINAL MEDICATION LIST END OF ENCOUNTER: No orders of the defined types were placed in this encounter.   There are no discontinued medications.   Current Outpatient Medications:  .  acetaminophen (TYLENOL) 650 MG CR tablet, as needed., Disp: , Rfl:  .  apixaban (ELIQUIS) 5 MG TABS tablet, Take 1 tablet (5 mg total) by mouth 2 (two) times daily., Disp: , Rfl:  .  atorvastatin (LIPITOR) 40 MG tablet, TAKE 1 TABLET(40 MG) BY MOUTH AT BEDTIME, Disp: 90 tablet, Rfl: 1 .  augmented betamethasone dipropionate (DIPROLENE-AF) 0.05 % cream, Apply 1 application topically daily as needed (itchy skin near bottom). , Disp: , Rfl:  .  BD INSULIN SYRINGE U/F 31G X 5/16" 0.5 ML MISC, 2 (two) times daily., Disp: , Rfl:  .  clindamycin (CLEOCIN T) 1 % external solution, Apply 1 application topically in the morning and at bedtime., Disp: , Rfl:  .   clotrimazole-betamethasone (LOTRISONE) cream, Apply 1 application topically 2 (two) times daily as needed (itching). , Disp: , Rfl:  .  dapagliflozin propanediol (FARXIGA) 5 MG TABS tablet, Take 5 mg by mouth daily., Disp: , Rfl:  .  diclofenac sodium (VOLTAREN) 1 % GEL, Apply 1 application topically 4 (four) times daily as needed (pain)., Disp: , Rfl:  .  doxycycline (ADOXA) 100 MG tablet, Take 100 mg by mouth daily., Disp: , Rfl:  .  furosemide (LASIX) 80 MG tablet, Take 0.5 tablets (40 mg total) by mouth daily. (Patient taking differently: Take 80 mg by mouth daily.), Disp: , Rfl:  .  glucose blood test strip, Use to test blood sugar twice a day, Disp: , Rfl:  .  insulin NPH-regular Human (NOVOLIN 70/30) (70-30) 100 UNIT/ML injection, Inject 30 Units into the skin 2 (two) times daily with a meal. , Disp: , Rfl:  .  Lancets (ONETOUCH DELICA PLUS [id number]) MISC, Use to test blood sugar twice daily, Disp: , Rfl:  .  metoprolol succinate (TOPROL-XL) 25 MG 24 hr tablet, TAKE 1 TABLET(25 MG) BY MOUTH DAILY WITH OR IMMEDIATELY FOLLOWING A MEAL, Disp: 90 tablet, Rfl: 0 .  NON FORMULARY, Take 1 tablet by mouth at bedtime. HeartZest, Disp: , Rfl:  .  NON FORMULARY, Take 1 tablet by mouth daily. CardioRelax AO, Disp: , Rfl:  .  vitamin B-12 (CYANOCOBALAMIN) 500 MCG tablet, Take 500 mcg by mouth daily. , Disp: , Rfl:  .  Vitamin D, Ergocalciferol, (DRISDOL) 1.25 MG (50000 UNIT) CAPS capsule, Take 50,000 Units by mouth every Sunday. , Disp: , Rfl:   Orders Placed This Encounter  Procedures  . LDL cholesterol, direct  . Lipid Panel With LDL/HDL Ratio  .  Basic metabolic panel  . Magnesium  . EKG 12-Lead   --Continue cardiac medications as reconciled in final medication list. --Return in about 6 months (around 05/01/2021) for Follow up, Dyspnea, coronary artery calcification.. Or sooner if needed. --Continue follow-up with your primary care physician regarding the management of your other chronic comorbid  conditions.  Patient's questions and concerns were addressed to his satisfaction. He voices understanding of the instructions provided during this encounter.   This note was created using a voice recognition software as a result there may be grammatical errors inadvertently enclosed that Zimmerman not reflect the nature of this encounter. Every attempt is made to correct such errors.  Total time spent: 30 minutes discussing the recent hospitalization, reviewing discharge summary, medication reconciliation, discussing disease management and current medical therapy.  Matthew Kras, [place], [facility]  Pager: [phone] Office: [phone]

## 2020-11-04 DIAGNOSIS — I2510 Atherosclerotic heart disease of native coronary artery without angina pectoris: Secondary | ICD-10-CM | POA: Diagnosis not present

## 2020-11-04 DIAGNOSIS — N1831 Chronic kidney disease, stage 3a: Secondary | ICD-10-CM | POA: Diagnosis not present

## 2020-11-04 DIAGNOSIS — I10 Essential (primary) hypertension: Secondary | ICD-10-CM | POA: Diagnosis not present

## 2020-11-04 DIAGNOSIS — E785 Hyperlipidemia, unspecified: Secondary | ICD-10-CM | POA: Diagnosis not present

## 2020-11-04 DIAGNOSIS — Z6829 Body mass index (BMI) 29.0-29.9, adult: Secondary | ICD-10-CM | POA: Diagnosis not present

## 2020-11-04 DIAGNOSIS — Z794 Long term (current) use of insulin: Secondary | ICD-10-CM | POA: Diagnosis not present

## 2020-11-04 DIAGNOSIS — I480 Paroxysmal atrial fibrillation: Secondary | ICD-10-CM | POA: Diagnosis not present

## 2020-11-04 DIAGNOSIS — E118 Type 2 diabetes mellitus with unspecified complications: Secondary | ICD-10-CM | POA: Diagnosis not present

## 2020-11-04 DIAGNOSIS — E1165 Type 2 diabetes mellitus with hyperglycemia: Secondary | ICD-10-CM | POA: Diagnosis not present

## 2020-11-04 DIAGNOSIS — E1122 Type 2 diabetes mellitus with diabetic chronic kidney disease: Secondary | ICD-10-CM | POA: Diagnosis not present

## 2020-11-15 ENCOUNTER — Other Ambulatory Visit: Payer: Self-pay

## 2020-11-15 ENCOUNTER — Ambulatory Visit (INDEPENDENT_AMBULATORY_CARE_PROVIDER_SITE_OTHER): Payer: HMO | Admitting: Ophthalmology

## 2020-11-15 ENCOUNTER — Encounter (INDEPENDENT_AMBULATORY_CARE_PROVIDER_SITE_OTHER): Payer: Self-pay | Admitting: Ophthalmology

## 2020-11-15 DIAGNOSIS — E113411 Type 2 diabetes mellitus with severe nonproliferative diabetic retinopathy with macular edema, right eye: Secondary | ICD-10-CM | POA: Diagnosis not present

## 2020-11-15 DIAGNOSIS — E113412 Type 2 diabetes mellitus with severe nonproliferative diabetic retinopathy with macular edema, left eye: Secondary | ICD-10-CM

## 2020-11-15 DIAGNOSIS — H04123 Dry eye syndrome of bilateral lacrimal glands: Secondary | ICD-10-CM

## 2020-11-15 NOTE — Progress Notes (Signed)
11/15/2020     CHIEF COMPLAINT Patient presents for Retina Follow Up (6 Month Diabetic f\u OU. OCT/Pt states vision is doing well. Denies new floaters and FOL./BGL: 136 currently)   HISTORY OF PRESENT ILLNESS: Matthew Zimmerman is a 75 y.o. male who presents to the clinic today for:   HPI    Retina Follow Up    Patient presents with  Diabetic Retinopathy.  In both eyes.  Severity is moderate.  Duration of 6 months.  Since onset it is stable.  I, the attending physician,  performed the HPI with the patient and updated documentation appropriately. Additional comments: 6 Month Diabetic f\u OU. OCT Pt states vision is doing well. Denies new floaters and FOL. BGL: 136 currently       Last edited by Tilda Franco on 11/15/2020 10:43 AM. (History)      Referring physician: Jani Gravel, MD [place],  [place] [id number]  HISTORICAL INFORMATION:   Selected notes from the MEDICAL RECORD NUMBER    Lab Results  Component Value Date   HGBA1C 8.5 (H) 08/05/2020     CURRENT MEDICATIONS: No current outpatient medications on file. (Ophthalmic Drugs)   No current facility-administered medications for this visit. (Ophthalmic Drugs)   Current Outpatient Medications (Other)  Medication Sig  . acetaminophen (TYLENOL) 650 MG CR tablet as needed.  [name] apixaban (ELIQUIS) 5 MG TABS tablet Take 1 tablet (5 mg total) by mouth 2 (two) times daily.  [name] atorvastatin (LIPITOR) 40 MG tablet TAKE 1 TABLET(40 MG) BY MOUTH AT BEDTIME  . augmented betamethasone dipropionate (DIPROLENE-AF) 0.05 % cream Apply 1 application topically daily as needed (itchy skin near bottom).   . BD INSULIN SYRINGE U/F 31G X 5/16" 0.5 ML MISC 2 (two) times daily.  . clindamycin (CLEOCIN T) 1 % external solution Apply 1 application topically in the morning and at bedtime.  . clotrimazole-betamethasone (LOTRISONE) cream Apply 1 application topically 2 (two) times daily as needed (itching).   .  dapagliflozin propanediol (FARXIGA) 5 MG TABS tablet Take 5 mg by mouth daily.  . diclofenac sodium (VOLTAREN) 1 % GEL Apply 1 application topically 4 (four) times daily as needed (pain).  [name] doxycycline (ADOXA) 100 MG tablet Take 100 mg by mouth daily.  . furosemide (LASIX) 80 MG tablet Take 0.5 tablets (40 mg total) by mouth daily. (Patient taking differently: Take 80 mg by mouth daily.)  . glucose blood test strip Use to test blood sugar twice a day  . insulin NPH-regular Human (NOVOLIN 70/30) (70-30) 100 UNIT/ML injection Inject 30 Units into the skin 2 (two) times daily with a meal.   . Lancets (ONETOUCH DELICA PLUS [id number]) MISC Use to test blood sugar twice daily  . metoprolol succinate (TOPROL-XL) 25 MG 24 hr tablet TAKE 1 TABLET(25 MG) BY MOUTH DAILY WITH OR IMMEDIATELY FOLLOWING A MEAL  . NON FORMULARY Take 1 tablet by mouth at bedtime. HeartZest  . NON FORMULARY Take 1 tablet by mouth daily. CardioRelax AO  . vitamin B-12 (CYANOCOBALAMIN) 500 MCG tablet Take 500 mcg by mouth daily.   . Vitamin D, Ergocalciferol, (DRISDOL) 1.25 MG (50000 UNIT) CAPS capsule Take 50,000 Units by mouth every Sunday.    No current facility-administered medications for this visit. (Other)      REVIEW OF SYSTEMS:    ALLERGIES Allergies  Allergen Reactions  . Cortisone Other (See Comments)    CBG thrown out of control  . Clotrimazole-Betamethasone Other (See Comments)  PAST MEDICAL HISTORY Past Medical History:  Diagnosis Date  . Carpal tunnel syndrome on both sides   . Chest pain   . Coronary artery calcification seen on CAT scan   . Diabetes mellitus without complication ([place])   . DVT (deep venous thrombosis) ([place])   . GERD (gastroesophageal reflux disease)   . Headache   . Hypercholesteremia   . Hyperlipidemia   . Hypertension   . PE (pulmonary embolism)   . Shortness of breath dyspnea    PE    Past Surgical History:  Procedure Laterality Date  . BILATERAL CARPAL TUNNEL  RELEASE Bilateral   . CATARACT EXTRACTION W/PHACO Left 08/05/2006   Dr. Gershon Crane  . CATARACT EXTRACTION W/PHACO Right 08/18/2006   Dr. Gershon Crane  . COLONOSCOPY    . COLONOSCOPY WITH PROPOFOL Left 09/20/2019   Procedure: COLONOSCOPY WITH PROPOFOL;  Surgeon: Ronnette Juniper, MD;  Location: [facility];  Service: Gastroenterology;  Laterality: Left;  . COLONOSCOPY WITH PROPOFOL N/A 10/03/2019   Procedure: COLONOSCOPY WITH PROPOFOL;  Surgeon: Wilford Corner, MD;  Location: [facility];  Service: Endoscopy;  Laterality: N/A;  . ELBOW SURGERY     BONE SPURS REMOVED B ELBOWS  . EYE SURGERY Bilateral    cataracts  . FOOT SURGERY     HEEL SPURS REMOVED B HEELS  . HEMOSTASIS CLIP PLACEMENT  09/20/2019   Procedure: HEMOSTASIS CLIP PLACEMENT;  Surgeon: Ronnette Juniper, MD;  Location: [facility];  Service: Gastroenterology;;  . HEMOSTASIS CLIP PLACEMENT  10/03/2019   Procedure: HEMOSTASIS CLIP PLACEMENT;  Surgeon: Wilford Corner, MD;  Location: [facility];  Service: Endoscopy;;  . HOT HEMOSTASIS N/A 09/20/2019   Procedure: HOT HEMOSTASIS (ARGON PLASMA COAGULATION/BICAP);  Surgeon: Ronnette Juniper, MD;  Location: [facility];  Service: Gastroenterology;  Laterality: N/A;  . I & D EXTREMITY Right 09/18/2014   Procedure: IRRIGATION AND DEBRIDEMENT RIGHT THUMB, open fracture repair;  Surgeon: Linna Hoff, MD;  Location: [facility];  Service: Orthopedics;  Laterality: Right;  . IR IVC FILTER PLMT / S&I Burke Keels GUID/MOD SED  10/03/2019  . LEG SURGERY Left    left thigh table saw accident, "cut to the bone", I&D/suturing  . SHOULDER SURGERY Left    Bone spur  . SPINE SURGERY  09/04/1985   lumbar spine  . SUBMUCOSAL INJECTION  10/03/2019   Procedure: SUBMUCOSAL INJECTION;  Surgeon: Wilford Corner, MD;  Location: [facility];  Service: Endoscopy;;  . TOE SURGERY      FAMILY HISTORY Family History  Problem Relation Age of Onset  . Cancer Mother   . Diabetes Father     SOCIAL HISTORY Social History    Tobacco Use  . Smoking status: Former Smoker    Packs/day: 0.50    Types: Cigarettes    Quit date: 1964    Years since quitting: 58.2  . Smokeless tobacco: Former Systems developer  . Tobacco comment: smoking since 75 years old  Vaping Use  . Vaping Use: Never used  Substance Use Topics  . Alcohol use: No  . Drug use: No         OPHTHALMIC EXAM: Base Eye Exam    Visual Acuity (Snellen - Linear)      Right Left   Dist cc 20/40 -2 20/60   Dist ph cc 20/40 +2 20/40 -2       Tonometry (Tonopen, 10:49 AM)      Right Left   Pressure 14 18       Pupils      Pupils  Dark Light Shape React APD   Right PERRL 3 3 Round Minimal None   Left PERRL 3 3 Round Minimal None       Visual Fields (Counting fingers)      Left Right    Full Full       Neuro/Psych    Oriented x3: Yes   Mood/Affect: Normal       Dilation    Both eyes: 1.0% Mydriacyl, 2.5% Phenylephrine @ 10:49 AM        Slit Lamp and Fundus Exam    External Exam      Right Left   External Normal Normal       Slit Lamp Exam      Right Left   Lids/Lashes Normal Normal   Conjunctiva/Sclera White and quiet White and quiet   Cornea Clear Clear   Anterior Chamber Deep and quiet Deep and quiet   Iris Round and reactive Round and reactive   Lens Posterior chamber intraocular lens Posterior chamber intraocular lens   Anterior Vitreous Normal Normal       Fundus Exam      Right Left   Posterior Vitreous Posterior vitreous detachment Posterior vitreous detachment   Disc cilio retinal artery Normal   C/D Ratio 0.35 0.3   Macula Macular thickening, Microaneurysms, Mild clinically significant macular edema, no exudates Microaneurysms, no macular thickening, Focal laser scars   Vessels Severe NPDR Severe NPDR   Periphery Normal Normal          IMAGING AND PROCEDURES  Imaging and Procedures for 11/15/20  OCT, Retina - OU - Both Eyes       Right Eye Quality was good. Scan locations included subfoveal. Central  Foveal Thickness: 344. Progression has worsened. Findings include abnormal foveal contour.   Left Eye Quality was good. Scan locations included subfoveal. Central Foveal Thickness: 259. Progression has improved. Findings include abnormal foveal contour.   Notes Minor increase in CSME superior and superonasal to the fovea of the right eye with minor center involvement.  Will need to resume intravitreal Avastin O right eye in the near future.  May need focal laser treatment to this new region leaking superiorly                ASSESSMENT/PLAN:  Severe nonproliferative diabetic retinopathy of right eye, with macular edema, associated with type 2 diabetes mellitus (HCC) Minor increase in CSME superior and superonasal to the fovea of the right eye with minor center involvement.  Will need to resume intravitreal Avastin O right eye in the near future.  May need focal laser treatment to this new region leaking superiorly  Severe nonproliferative diabetic retinopathy of left eye, with macular edema, associated with type 2 diabetes mellitus (HCC) OS stable CSME will observe  Dry eyes, bilateral Dry eyes OU - recommend artificial tears and lubricating ointment as needed patient also may use these at nighttime      ICD-10-CM   1. Severe nonproliferative diabetic retinopathy of right eye, with macular edema, associated with type 2 diabetes mellitus (HCC)  E11.3411 OCT, Retina - OU - Both Eyes  2. Severe nonproliferative diabetic retinopathy of left eye, with macular edema, associated with type 2 diabetes mellitus (HCC)  A54.0981 OCT, Retina - OU - Both Eyes  3. Dry eyes, bilateral  H04.123     1.  2.  3.  Ophthalmic Meds Ordered this visit:  No orders of the defined types were placed in this encounter.  Return in about 1 week (around 11/22/2020) for DILATE OU, OPTOS FFA R/L, COLOR FP, AVASTIN OCT, OD.  There are no Patient Instructions on file for this visit.   Explained  the diagnoses, plan, and follow up with the patient and they expressed understanding.  Patient expressed understanding of the importance of proper follow up care.   Clent Demark Ifeoma Vallin M.D. Diseases & Surgery of the Retina and Vitreous [facility] [place] 11/15/20     Abbreviations: M myopia (nearsighted); A astigmatism; H hyperopia (farsighted); P presbyopia; Mrx spectacle prescription;  CTL contact lenses; OD right eye; OS left eye; OU both eyes  XT exotropia; ET esotropia; PEK punctate epithelial keratitis; PEE punctate epithelial erosions; DES dry eye syndrome; MGD meibomian gland dysfunction; ATs artificial tears; PFAT's preservative free artificial tears; [place] nuclear sclerotic cataract; PSC posterior subcapsular cataract; ERM epi-retinal membrane; PVD posterior vitreous detachment; RD retinal detachment; DM diabetes mellitus; DR diabetic retinopathy; NPDR non-proliferative diabetic retinopathy; PDR proliferative diabetic retinopathy; CSME clinically significant macular edema; DME diabetic macular edema; dbh dot blot hemorrhages; CWS cotton wool spot; POAG primary open angle glaucoma; C/D cup-to-disc ratio; HVF humphrey visual field; GVF goldmann visual field; OCT optical coherence tomography; IOP intraocular pressure; BRVO Branch retinal vein occlusion; CRVO central retinal vein occlusion; CRAO central retinal artery occlusion; BRAO branch retinal artery occlusion; RT retinal tear; SB scleral buckle; PPV pars plana vitrectomy; VH Vitreous hemorrhage; PRP panretinal laser photocoagulation; IVK intravitreal kenalog; VMT vitreomacular traction; MH Macular hole;  NVD neovascularization of the disc; NVE neovascularization elsewhere; AREDS age related eye disease study; ARMD age related macular degeneration; POAG primary open angle glaucoma; EBMD epithelial/anterior basement membrane dystrophy; ACIOL anterior chamber intraocular lens; IOL intraocular lens; PCIOL posterior chamber intraocular lens;  Phaco/IOL phacoemulsification with intraocular lens placement; [place] photorefractive keratectomy; LASIK laser assisted in situ keratomileusis; HTN hypertension; DM diabetes mellitus; COPD chronic obstructive pulmonary disease

## 2020-11-15 NOTE — Assessment & Plan Note (Signed)
Minor increase in CSME superior and superonasal to the fovea of the right eye with minor center involvement.  Will need to resume intravitreal Avastin O right eye in the near future.  May need focal laser treatment to this new region leaking superiorly

## 2020-11-15 NOTE — Assessment & Plan Note (Signed)
Dry eyes OU - recommend artificial tears and lubricating ointment as needed patient also may use these at nighttime

## 2020-11-15 NOTE — Assessment & Plan Note (Signed)
OS stable CSME will observe

## 2020-11-24 ENCOUNTER — Other Ambulatory Visit: Payer: Self-pay

## 2020-11-24 ENCOUNTER — Ambulatory Visit (INDEPENDENT_AMBULATORY_CARE_PROVIDER_SITE_OTHER): Payer: HMO | Admitting: Ophthalmology

## 2020-11-24 ENCOUNTER — Encounter (INDEPENDENT_AMBULATORY_CARE_PROVIDER_SITE_OTHER): Payer: Self-pay | Admitting: Ophthalmology

## 2020-11-24 DIAGNOSIS — E113412 Type 2 diabetes mellitus with severe nonproliferative diabetic retinopathy with macular edema, left eye: Secondary | ICD-10-CM | POA: Diagnosis not present

## 2020-11-24 DIAGNOSIS — E113411 Type 2 diabetes mellitus with severe nonproliferative diabetic retinopathy with macular edema, right eye: Secondary | ICD-10-CM

## 2020-11-24 MED ORDER — BEVACIZUMAB 2.5 MG/0.1ML IZ SOSY
2.5000 mg | PREFILLED_SYRINGE | INTRAVITREAL | Status: AC | PRN
  Administered 2020-11-24: 2.5 mg via INTRAVITREAL

## 2020-11-24 MED ORDER — FLUORESCEIN SODIUM 10 % IV SOLN
500.0000 mg | INTRAVENOUS | Status: AC | PRN
  Administered 2020-11-24: 500 mg via INTRAVENOUS

## 2020-11-24 NOTE — Progress Notes (Signed)
11/24/2020     CHIEF COMPLAINT Patient presents for Retina Follow Up (1 Week NPDR f\u. FFA R/L. Possible Avastin OD. OCT and FP/Pt denies changes in vision./BGL: 32 currently)   HISTORY OF PRESENT ILLNESS: Matthew Zimmerman is a 75 y.o. male who presents to the clinic today for:   HPI    Retina Follow Up    Patient presents with  Diabetic Retinopathy.  In right eye.  Severity is severe.  Duration of 1 week.  Since onset it is stable.  I, the attending physician,  performed the HPI with the patient and updated documentation appropriately. Additional comments: 1 Week NPDR f\u. FFA R/L. Possible Avastin OD. OCT and FP Pt denies changes in vision. BGL: 223 currently       Last edited by Tilda Franco on 11/24/2020  8:10 AM. (History)      Referring physician: Jani Gravel, MD [place],  [place] [address]  HISTORICAL INFORMATION:   Selected notes from the MEDICAL RECORD NUMBER    Lab Results  Component Value Date   HGBA1C 8.5 (H) 08/05/2020     CURRENT MEDICATIONS: No current outpatient medications on file. (Ophthalmic Drugs)   No current facility-administered medications for this visit. (Ophthalmic Drugs)   Current Outpatient Medications (Other)  Medication Sig  . acetaminophen (TYLENOL) 650 MG CR tablet as needed.  [name] apixaban (ELIQUIS) 5 MG TABS tablet Take 1 tablet (5 mg total) by mouth 2 (two) times daily.  [name] atorvastatin (LIPITOR) 40 MG tablet TAKE 1 TABLET(40 MG) BY MOUTH AT BEDTIME  . augmented betamethasone dipropionate (DIPROLENE-AF) 0.05 % cream Apply 1 application topically daily as needed (itchy skin near bottom).   . BD INSULIN SYRINGE U/F 31G X 5/16" 0.5 ML MISC 2 (two) times daily.  . clindamycin (CLEOCIN T) 1 % external solution Apply 1 application topically in the morning and at bedtime.  . clotrimazole-betamethasone (LOTRISONE) cream Apply 1 application topically 2 (two) times daily as needed (itching).   . dapagliflozin  propanediol (FARXIGA) 5 MG TABS tablet Take 5 mg by mouth daily.  . diclofenac sodium (VOLTAREN) 1 % GEL Apply 1 application topically 4 (four) times daily as needed (pain).  [name] doxycycline (ADOXA) 100 MG tablet Take 100 mg by mouth daily.  . furosemide (LASIX) 80 MG tablet Take 0.5 tablets (40 mg total) by mouth daily. (Patient taking differently: Take 80 mg by mouth daily.)  . glucose blood test strip Use to test blood sugar twice a day  . insulin NPH-regular Human (NOVOLIN 70/30) (70-30) 100 UNIT/ML injection Inject 30 Units into the skin 2 (two) times daily with a meal.   . Lancets (ONETOUCH DELICA PLUS [id number]) MISC Use to test blood sugar twice daily  . metoprolol succinate (TOPROL-XL) 25 MG 24 hr tablet TAKE 1 TABLET(25 MG) BY MOUTH DAILY WITH OR IMMEDIATELY FOLLOWING A MEAL  . NON FORMULARY Take 1 tablet by mouth at bedtime. HeartZest  . NON FORMULARY Take 1 tablet by mouth daily. CardioRelax AO  . vitamin B-12 (CYANOCOBALAMIN) 500 MCG tablet Take 500 mcg by mouth daily.   . Vitamin D, Ergocalciferol, (DRISDOL) 1.25 MG (50000 UNIT) CAPS capsule Take 50,000 Units by mouth every Sunday.    No current facility-administered medications for this visit. (Other)      REVIEW OF SYSTEMS: ROS    Positive for: Endocrine   Last edited by Tilda Franco on 11/24/2020  8:10 AM. (History)       ALLERGIES  Allergies  Allergen Reactions  . Cortisone Other (See Comments)    CBG thrown out of control  . Clotrimazole-Betamethasone Other (See Comments)    PAST MEDICAL HISTORY Past Medical History:  Diagnosis Date  . Carpal tunnel syndrome on both sides   . Chest pain   . Coronary artery calcification seen on CAT scan   . Diabetes mellitus without complication ([place])   . DVT (deep venous thrombosis) ([place])   . GERD (gastroesophageal reflux disease)   . Headache   . Hypercholesteremia   . Hyperlipidemia   . Hypertension   . PE (pulmonary embolism)   . Shortness of breath dyspnea     PE    Past Surgical History:  Procedure Laterality Date  . BILATERAL CARPAL TUNNEL RELEASE Bilateral   . CATARACT EXTRACTION W/PHACO Left 08/05/2006   Dr. Gershon Crane  . CATARACT EXTRACTION W/PHACO Right 08/18/2006   Dr. Gershon Crane  . COLONOSCOPY    . COLONOSCOPY WITH PROPOFOL Left 09/20/2019   Procedure: COLONOSCOPY WITH PROPOFOL;  Surgeon: Ronnette Juniper, MD;  Location: [facility];  Service: Gastroenterology;  Laterality: Left;  . COLONOSCOPY WITH PROPOFOL N/A 10/03/2019   Procedure: COLONOSCOPY WITH PROPOFOL;  Surgeon: Wilford Corner, MD;  Location: [facility];  Service: Endoscopy;  Laterality: N/A;  . ELBOW SURGERY     BONE SPURS REMOVED B ELBOWS  . EYE SURGERY Bilateral    cataracts  . FOOT SURGERY     HEEL SPURS REMOVED B HEELS  . HEMOSTASIS CLIP PLACEMENT  09/20/2019   Procedure: HEMOSTASIS CLIP PLACEMENT;  Surgeon: Ronnette Juniper, MD;  Location: [facility];  Service: Gastroenterology;;  . HEMOSTASIS CLIP PLACEMENT  10/03/2019   Procedure: HEMOSTASIS CLIP PLACEMENT;  Surgeon: Wilford Corner, MD;  Location: [facility];  Service: Endoscopy;;  . HOT HEMOSTASIS N/A 09/20/2019   Procedure: HOT HEMOSTASIS (ARGON PLASMA COAGULATION/BICAP);  Surgeon: Ronnette Juniper, MD;  Location: [facility];  Service: Gastroenterology;  Laterality: N/A;  . I & D EXTREMITY Right 09/18/2014   Procedure: IRRIGATION AND DEBRIDEMENT RIGHT THUMB, open fracture repair;  Surgeon: Linna Hoff, MD;  Location: [facility];  Service: Orthopedics;  Laterality: Right;  . IR IVC FILTER PLMT / S&I Burke Keels GUID/MOD SED  10/03/2019  . LEG SURGERY Left    left thigh table saw accident, "cut to the bone", I&D/suturing  . SHOULDER SURGERY Left    Bone spur  . SPINE SURGERY  09/04/1985   lumbar spine  . SUBMUCOSAL INJECTION  10/03/2019   Procedure: SUBMUCOSAL INJECTION;  Surgeon: Wilford Corner, MD;  Location: [facility];  Service: Endoscopy;;  . TOE SURGERY      FAMILY HISTORY Family History  Problem Relation Age of Onset   . Cancer Mother   . Diabetes Father     SOCIAL HISTORY Social History   Tobacco Use  . Smoking status: Former Smoker    Packs/day: 0.50    Types: Cigarettes    Quit date: 1964    Years since quitting: 58.2  . Smokeless tobacco: Former Systems developer  . Tobacco comment: smoking since 75 years old  Vaping Use  . Vaping Use: Never used  Substance Use Topics  . Alcohol use: No  . Drug use: No         OPHTHALMIC EXAM: Base Eye Exam    Visual Acuity (Snellen - Linear)      Right Left   Dist [place] 20/40 -2 20/80   Dist ph [place] 20/30 -2 20/60 -1       Tonometry (Tonopen,  8:14 AM)      Right Left   Pressure 14 13       Pupils      Pupils Dark Light Shape React APD   Right PERRL 3 3 Round Minimal None   Left PERRL 3 3 Round Minimal None       Neuro/Psych    Oriented x3: Yes   Mood/Affect: Normal       Dilation    Both eyes: 1.0% Mydriacyl, 2.5% Phenylephrine @ 8:14 AM        Slit Lamp and Fundus Exam    External Exam      Right Left   External Normal Normal       Slit Lamp Exam      Right Left   Lids/Lashes Normal Normal   Conjunctiva/Sclera White and quiet White and quiet   Cornea Clear Clear   Anterior Chamber Deep and quiet Deep and quiet   Iris Round and reactive Round and reactive   Lens Posterior chamber intraocular lens Posterior chamber intraocular lens   Anterior Vitreous Normal Normal       Fundus Exam      Right Left   Posterior Vitreous Posterior vitreous detachment Posterior vitreous detachment   Disc cilio retinal artery Normal   C/D Ratio 0.35 0.3   Macula Macular thickening, Microaneurysms, Mild clinically significant macular edema, no exudates Microaneurysms, no macular thickening, Focal laser scars   Vessels Severe NPDR Severe NPDR   Periphery Normal Normal          IMAGING AND PROCEDURES  Imaging and Procedures for 11/24/20  OCT, Retina - OU - Both Eyes       Right Eye Quality was good. Scan locations included subfoveal. Central  Foveal Thickness: 334. Progression has worsened. Findings include abnormal foveal contour.   Left Eye Quality was good. Scan locations included subfoveal. Central Foveal Thickness: 234. Progression has improved. Findings include abnormal foveal contour.   Notes Minor increase in CSME superior and superonasal to the fovea of the right eye with minor center involvement.  Will need to resume intravitreal Avastin OD, right eye in the today.  May need focal laser treatment to this new region leaking superiorly       Color Fundus Photography Optos - OU - Both Eyes       Right Eye Progression has no prior data. Macula : microaneurysms.   Left Eye Progression has no prior data. Disc findings include normal observations. Macula : microaneurysms.   Notes Bilateral severe NPDR, possible early CSME OD, difficult to evaluate on color fundus photography alone       Fluorescein Angiography Optos (Transit OD)       Injection:  500 mg Fluorescein Sodium 10 % injection   NDC: [phone]   Route: Intravenous, Site: Right ArmRight Eye   Progression has no prior data. Early phase findings include microaneurysm. Mid/Late phase findings include leakage, microaneurysm. Choroidal neovascularization is not present.   Left Eye   Progression has no prior data. Mid/Late phase findings include microaneurysm, leakage.   Notes Moderate NPDR documented peripherally OD borderline severe.  CSME is notable treatable with microaneurysms superonasal to the fovea and inferotemporal.  Correlation with OCT confirms easily sites of leakage    OS, no signs of CSME at this time       Intravitreal Injection, Pharmacologic Agent - OD - Right Eye       Time Out 11/24/2020. 9:11 AM. Confirmed correct patient, procedure, site, and patient consented.  Anesthesia Topical anesthesia was used. Anesthetic medications included [place] 3.5%.   Procedure Preparation included Tobramycin 0.3%, 10% betadine to  eyelids, 5% betadine to ocular surface. A 30 gauge needle was used.   Injection:  2.5 mg Bevacizumab (AVASTIN) 2.5mg /0.71mL SOSY   NDC: 00923-300-76, Lot: [id number]   Route: Intravitreal, Site: Right Eye  Post-op Post injection exam found visual acuity of at least counting fingers. The patient tolerated the procedure well. There were no complications. The patient received written and verbal post procedure care education. Post injection medications were not given.                 ASSESSMENT/PLAN:  Severe nonproliferative diabetic retinopathy of left eye, with macular edema, associated with type 2 diabetes mellitus ([place]) OS improved by OCT and by fluorescein angiography will observe  Severe nonproliferative diabetic retinopathy of right eye, with macular edema, associated with type 2 diabetes mellitus ([place]) Will resume him if CSME OD with intravitreal Avastin today follow-up in 7 weeks possible repeat Avastin followed thereafter by focal laser treatment to the areas involved      ICD-10-CM   1. Severe nonproliferative diabetic retinopathy of right eye, with macular edema, associated with type 2 diabetes mellitus ([facility])  E11.3411 OCT, Retina - OU - Both Eyes    Color Fundus Photography Optos - OU - Both Eyes    Fluorescein Angiography Optos (Transit OD)    Fluorescein Sodium 10 % injection 500 mg    Intravitreal Injection, Pharmacologic Agent - OD - Right Eye    bevacizumab (AVASTIN) SOSY 2.5 mg  2. Severe nonproliferative diabetic retinopathy of left eye, with macular edema, associated with type 2 diabetes mellitus ([place])  K56.2563     1.  We will deliver intravitreal Avastin OD today for center involved CSME.  2.  Dilate OD next in 7 weeks possible injection Avastin  3.  In the future will need focal laser treatment to permanently halt CSME recurrence OD  Ophthalmic Meds Ordered this visit:  Meds ordered this encounter  Medications  . Fluorescein Sodium 10 % injection 500 mg   . bevacizumab (AVASTIN) SOSY 2.5 mg       Return in about 7 weeks (around 01/12/2021) for dilate, OD, AVASTIN OCT.  There are no Patient Instructions on file for this visit.   Explained the diagnoses, plan, and follow up with the patient and they expressed understanding.  Patient expressed understanding of the importance of proper follow up care.   Clent Demark Rankin M.D. Diseases & Surgery of the Retina and Vitreous Retina & Diabetic [place] 11/24/20     Abbreviations: M myopia (nearsighted); A astigmatism; H hyperopia (farsighted); P presbyopia; Mrx spectacle prescription;  CTL contact lenses; OD right eye; OS left eye; OU both eyes  XT exotropia; ET esotropia; PEK punctate epithelial keratitis; PEE punctate epithelial erosions; DES dry eye syndrome; MGD meibomian gland dysfunction; ATs artificial tears; PFAT's preservative free artificial tears; [place] nuclear sclerotic cataract; PSC posterior subcapsular cataract; ERM epi-retinal membrane; PVD posterior vitreous detachment; RD retinal detachment; DM diabetes mellitus; DR diabetic retinopathy; NPDR non-proliferative diabetic retinopathy; PDR proliferative diabetic retinopathy; CSME clinically significant macular edema; DME diabetic macular edema; dbh dot blot hemorrhages; CWS cotton wool spot; POAG primary open angle glaucoma; C/D cup-to-disc ratio; HVF humphrey visual field; GVF goldmann visual field; OCT optical coherence tomography; IOP intraocular pressure; BRVO Branch retinal vein occlusion; CRVO central retinal vein occlusion; CRAO central retinal artery occlusion; BRAO branch retinal artery occlusion; RT retinal tear; SB  scleral buckle; PPV pars plana vitrectomy; VH Vitreous hemorrhage; PRP panretinal laser photocoagulation; IVK intravitreal kenalog; VMT vitreomacular traction; MH Macular hole;  NVD neovascularization of the disc; NVE neovascularization elsewhere; AREDS age related eye disease study; ARMD age related macular degeneration;  POAG primary open angle glaucoma; EBMD epithelial/anterior basement membrane dystrophy; ACIOL anterior chamber intraocular lens; IOL intraocular lens; PCIOL posterior chamber intraocular lens; Phaco/IOL phacoemulsification with intraocular lens placement; [place] photorefractive keratectomy; LASIK laser assisted in situ keratomileusis; HTN hypertension; DM diabetes mellitus; COPD chronic obstructive pulmonary disease

## 2020-11-24 NOTE — Assessment & Plan Note (Signed)
OS improved by OCT and by fluorescein angiography will observe

## 2020-11-24 NOTE — Assessment & Plan Note (Signed)
Will resume him if CSME OD with intravitreal Avastin today follow-up in 7 weeks possible repeat Avastin followed thereafter by focal laser treatment to the areas involved

## 2020-12-10 DIAGNOSIS — Z20822 Contact with and (suspected) exposure to covid-19: Secondary | ICD-10-CM | POA: Diagnosis not present

## 2020-12-11 IMAGING — CT CT HEAD W/O CM
3 series · 15 of 47 positions shown, 18 images · non-contrast
Comparison: 09/30/2019

CLINICAL DATA: Encephalopathy. Increased weakness and low oxygen
levels. Multiple colonoscopies for rectal bleeding.

EXAM:
CT HEAD WITHOUT CONTRAST
TECHNIQUE: Contiguous axial images were obtained from the base of the skull
through the vertex without intravenous contrast.

[Series 2: head wo · axial · 0.45mm/px · z∈[-260,-130]mm · 9 of 32 slices shown, 12 images]
[im 3/32  brain]
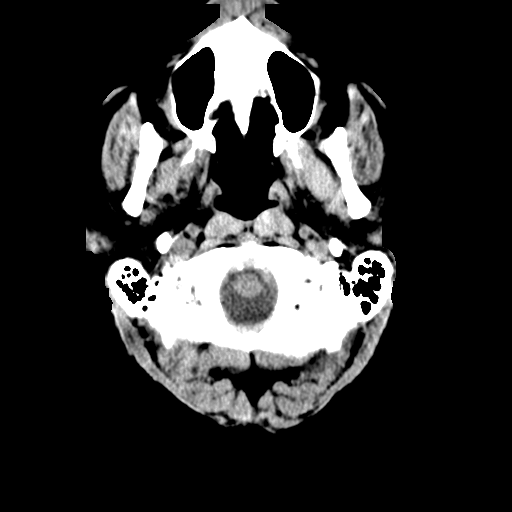
[im 3/32  bone]
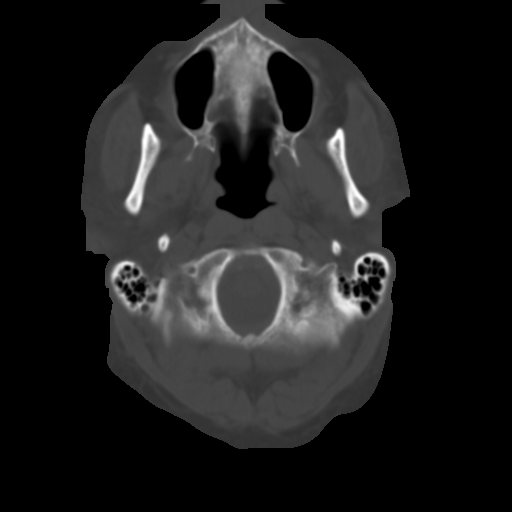
[im 6/32  brain]
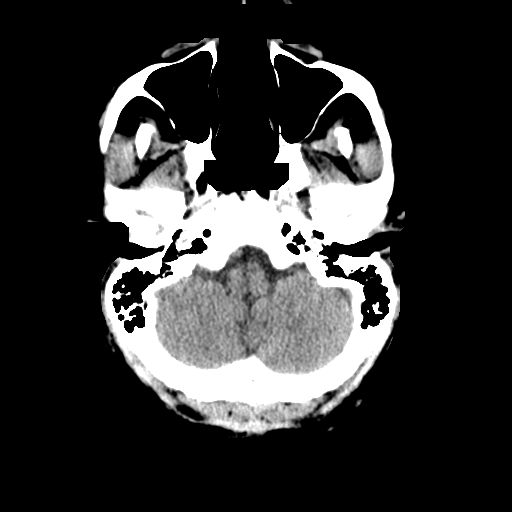
[im 9/32  brain]
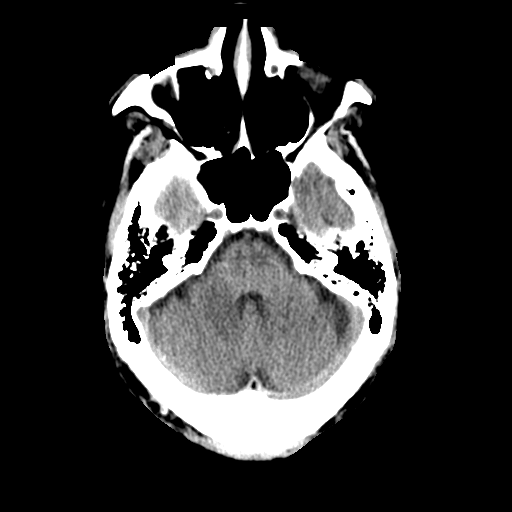
[im 12/32  brain]
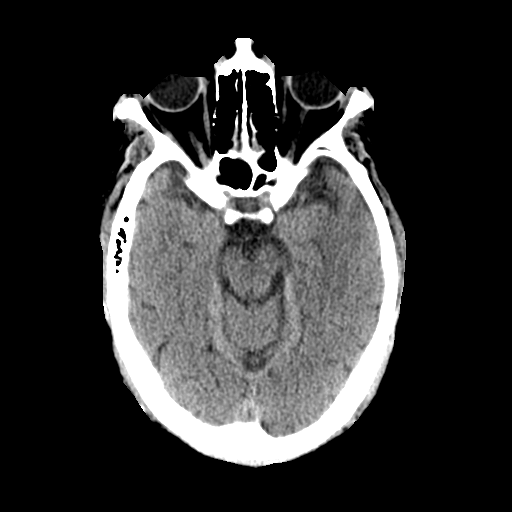
[im 17/32  brain]
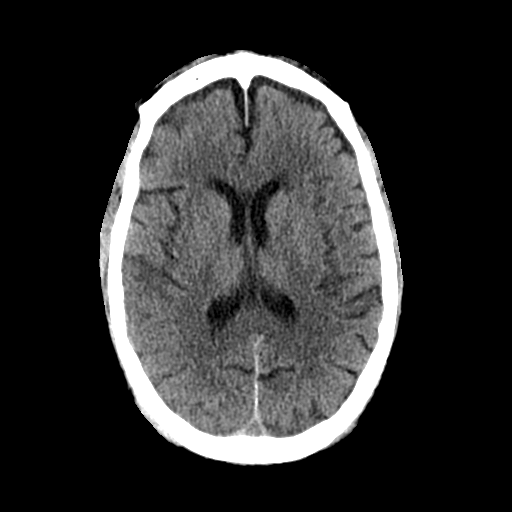
[im 17/32  bone]
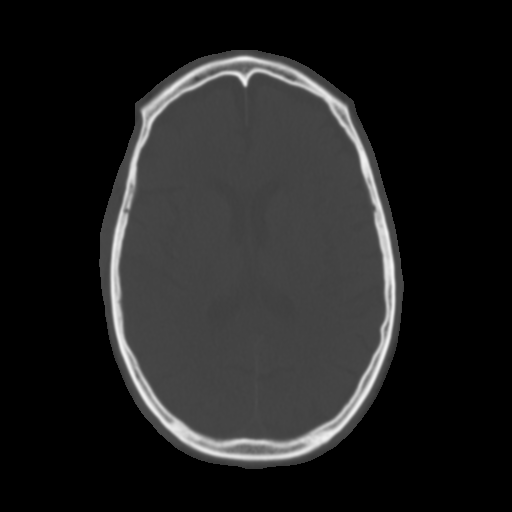
[im 20/32  brain]
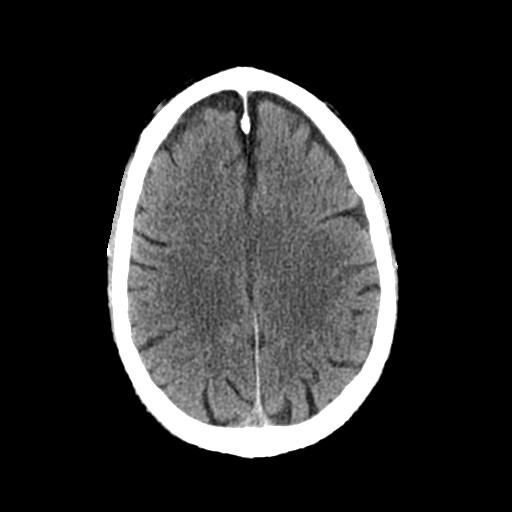
[im 23/32  brain]
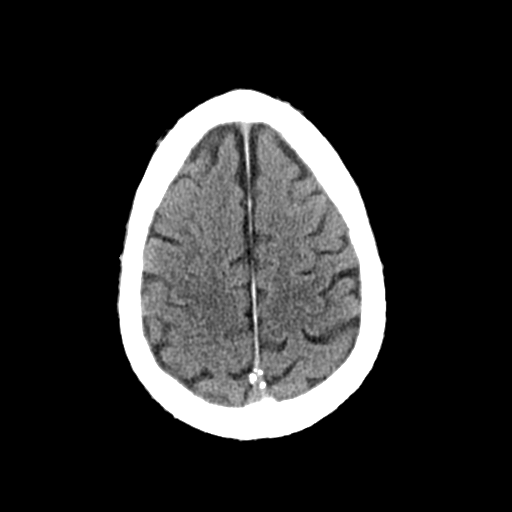
[im 26/32  brain]
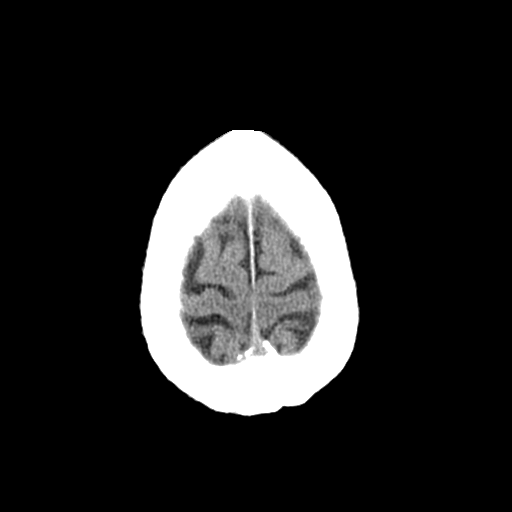
[im 29/32  brain]
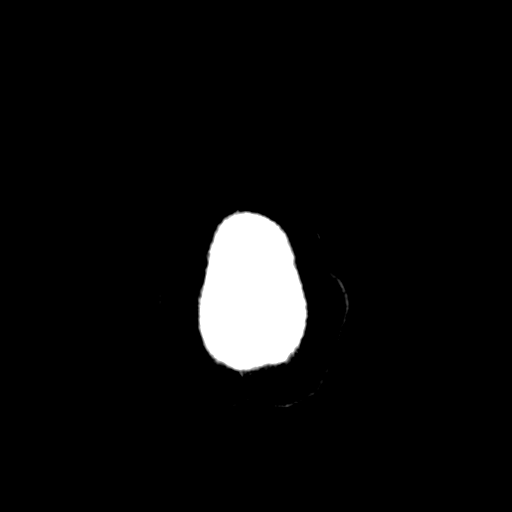
[im 29/32  bone]
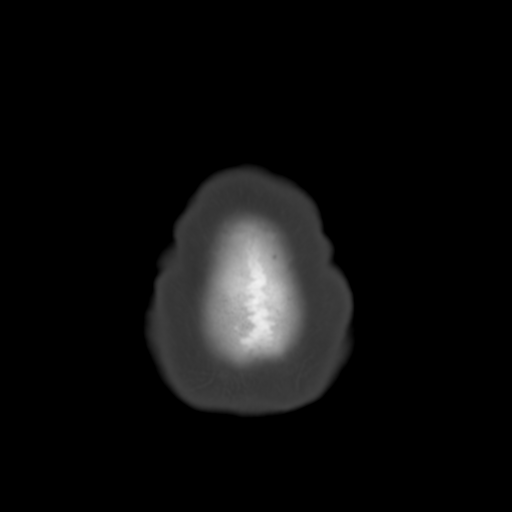

[Series 6: coronal soft tissue · coronal · 0.31mm/px · 3 of 78 slices shown]
[im 26/78  brain]
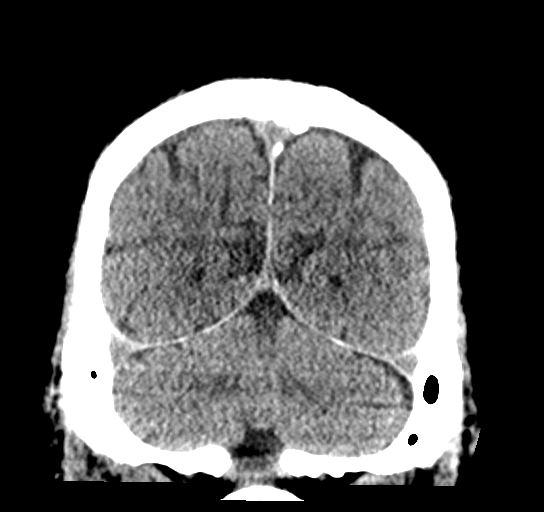
[im 35/78  brain]
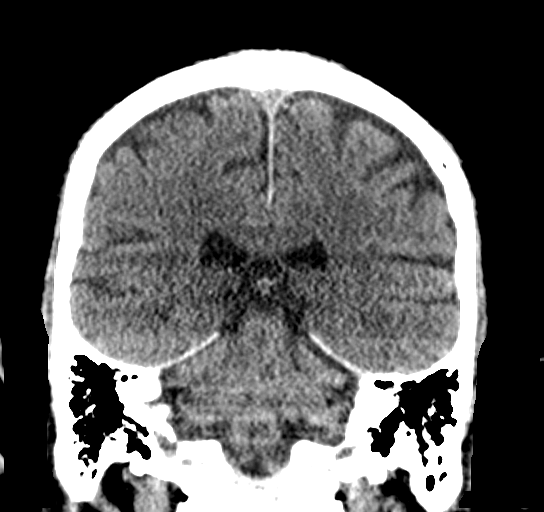
[im 43/78  brain]
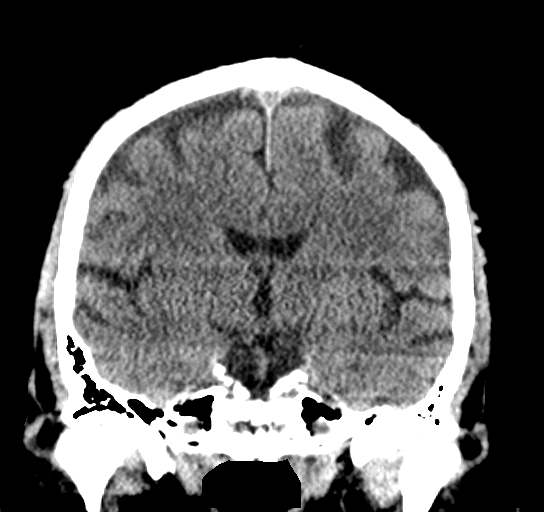

[Series 7: sagittal soft tissue · sagittal · 0.31mm/px · 3 of 60 slices shown]
[im 20/60  brain]
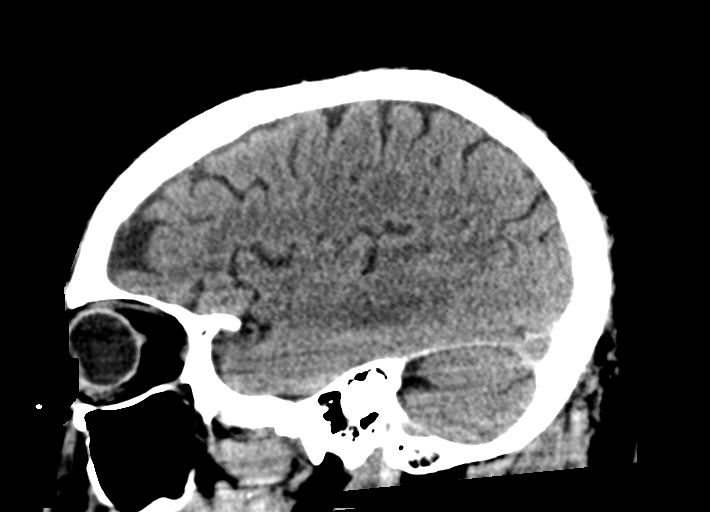
[im 30/60  brain]
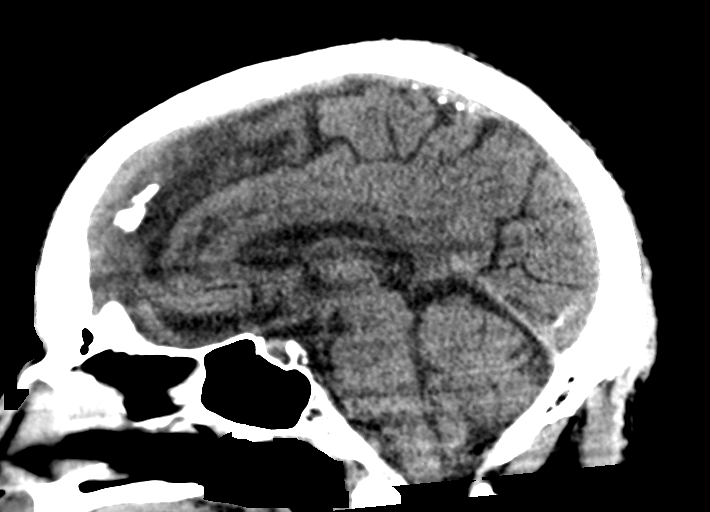
[im 40/60  brain]
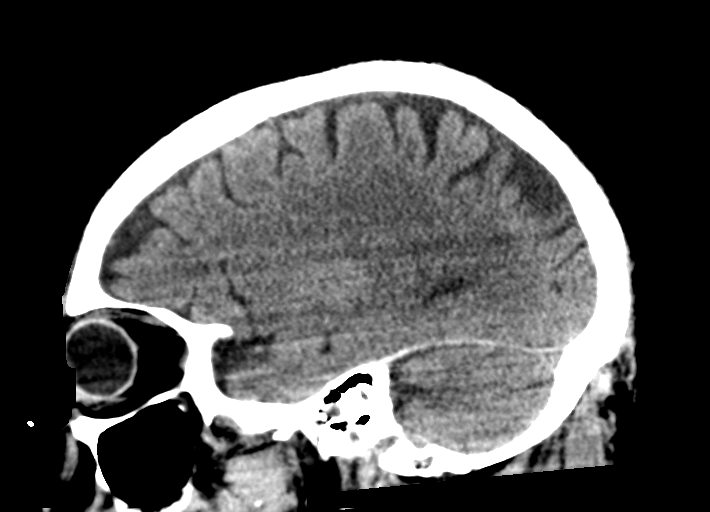

[15 of 47 positions shown; findings below may reference images not displayed]

FINDINGS: Brain: No evidence of acute infarction, hemorrhage, hydrocephalus,
extra-axial collection or mass lesion/mass effect. Stable appearance
of tentorial and parafalcine calcifications.

Vascular: No hyperdense vessel or unexpected calcification.

Skull: Normal. Negative for fracture or focal lesion.

Sinuses/Orbits: No acute finding.

Other: None
IMPRESSION: No evidence for acute intracranial abnormality.

## 2020-12-30 DIAGNOSIS — I2510 Atherosclerotic heart disease of native coronary artery without angina pectoris: Secondary | ICD-10-CM | POA: Diagnosis not present

## 2020-12-30 DIAGNOSIS — I482 Chronic atrial fibrillation, unspecified: Secondary | ICD-10-CM | POA: Diagnosis not present

## 2020-12-30 DIAGNOSIS — N189 Chronic kidney disease, unspecified: Secondary | ICD-10-CM | POA: Diagnosis not present

## 2020-12-30 DIAGNOSIS — Z125 Encounter for screening for malignant neoplasm of prostate: Secondary | ICD-10-CM | POA: Diagnosis not present

## 2020-12-30 DIAGNOSIS — D649 Anemia, unspecified: Secondary | ICD-10-CM | POA: Diagnosis not present

## 2020-12-30 DIAGNOSIS — Z794 Long term (current) use of insulin: Secondary | ICD-10-CM | POA: Diagnosis not present

## 2020-12-30 DIAGNOSIS — E162 Hypoglycemia, unspecified: Secondary | ICD-10-CM | POA: Diagnosis not present

## 2020-12-30 DIAGNOSIS — E1140 Type 2 diabetes mellitus with diabetic neuropathy, unspecified: Secondary | ICD-10-CM | POA: Diagnosis not present

## 2020-12-30 DIAGNOSIS — E785 Hyperlipidemia, unspecified: Secondary | ICD-10-CM | POA: Diagnosis not present

## 2020-12-30 DIAGNOSIS — S161XXA Strain of muscle, fascia and tendon at neck level, initial encounter: Secondary | ICD-10-CM | POA: Diagnosis not present

## 2020-12-30 DIAGNOSIS — I10 Essential (primary) hypertension: Secondary | ICD-10-CM | POA: Diagnosis not present

## 2020-12-30 DIAGNOSIS — R972 Elevated prostate specific antigen [PSA]: Secondary | ICD-10-CM | POA: Diagnosis not present

## 2020-12-30 DIAGNOSIS — E118 Type 2 diabetes mellitus with unspecified complications: Secondary | ICD-10-CM | POA: Diagnosis not present

## 2021-01-12 ENCOUNTER — Other Ambulatory Visit: Payer: Self-pay

## 2021-01-12 ENCOUNTER — Ambulatory Visit (INDEPENDENT_AMBULATORY_CARE_PROVIDER_SITE_OTHER): Payer: HMO | Admitting: Ophthalmology

## 2021-01-12 ENCOUNTER — Encounter (INDEPENDENT_AMBULATORY_CARE_PROVIDER_SITE_OTHER): Payer: Self-pay | Admitting: Ophthalmology

## 2021-01-12 DIAGNOSIS — E113411 Type 2 diabetes mellitus with severe nonproliferative diabetic retinopathy with macular edema, right eye: Secondary | ICD-10-CM

## 2021-01-12 DIAGNOSIS — E113412 Type 2 diabetes mellitus with severe nonproliferative diabetic retinopathy with macular edema, left eye: Secondary | ICD-10-CM | POA: Diagnosis not present

## 2021-01-12 DIAGNOSIS — Z20822 Contact with and (suspected) exposure to covid-19: Secondary | ICD-10-CM | POA: Diagnosis not present

## 2021-01-12 DIAGNOSIS — H35372 Puckering of macula, left eye: Secondary | ICD-10-CM | POA: Insufficient documentation

## 2021-01-12 MED ORDER — BEVACIZUMAB 2.5 MG/0.1ML IZ SOSY
2.5000 mg | PREFILLED_SYRINGE | INTRAVITREAL | Status: AC | PRN
  Administered 2021-01-12: 2.5 mg via INTRAVITREAL

## 2021-01-12 NOTE — Assessment & Plan Note (Signed)
Well-controlled left eye no recurrence of CSME

## 2021-01-12 NOTE — Assessment & Plan Note (Signed)
No topographic distortion no impact on acuity OS, will continue to monitor and observe

## 2021-01-12 NOTE — Progress Notes (Signed)
01/12/2021     CHIEF COMPLAINT Patient presents for No chief complaint on file.   HISTORY OF PRESENT ILLNESS: Matthew Zimmerman is a 75 y.o. male who presents to the clinic today for:     Referring physician: Pearson GrippeKim, James, MD [address] [place] [id number]  HISTORICAL INFORMATION:   Selected notes from the MEDICAL RECORD NUMBER    Lab Results  Component Value Date   HGBA1C 8.5 (H) 08/05/2020     CURRENT MEDICATIONS: No current outpatient medications on file. (Ophthalmic Drugs)   No current facility-administered medications for this visit. (Ophthalmic Drugs)   Current Outpatient Medications (Other)  Medication Sig  . acetaminophen (TYLENOL) 650 MG CR tablet as needed.  [name]. apixaban (ELIQUIS) 5 MG TABS tablet Take 1 tablet (5 mg total) by mouth 2 (two) times daily.  [name]. atorvastatin (LIPITOR) 40 MG tablet TAKE 1 TABLET(40 MG) BY MOUTH AT BEDTIME  . augmented betamethasone dipropionate (DIPROLENE-AF) 0.05 % cream Apply 1 application topically daily as needed (itchy skin near bottom).   . BD INSULIN SYRINGE U/F 31G X 5/16" 0.5 ML MISC 2 (two) times daily.  . clindamycin (CLEOCIN T) 1 % external solution Apply 1 application topically in the morning and at bedtime.  . clotrimazole-betamethasone (LOTRISONE) cream Apply 1 application topically 2 (two) times daily as needed (itching).   . dapagliflozin propanediol (FARXIGA) 5 MG TABS tablet Take 5 mg by mouth daily.  . diclofenac sodium (VOLTAREN) 1 % GEL Apply 1 application topically 4 (four) times daily as needed (pain).  [name]. doxycycline (ADOXA) 100 MG tablet Take 100 mg by mouth daily.  . furosemide (LASIX) 80 MG tablet Take 0.5 tablets (40 mg total) by mouth daily. (Patient taking differently: Take 80 mg by mouth daily.)  . glucose blood test strip Use to test blood sugar twice a day  . insulin NPH-regular Human (NOVOLIN 70/30) (70-30) 100 UNIT/ML injection Inject 30 Units into the skin 2 (two) times daily with  a meal.   . Lancets (ONETOUCH DELICA PLUS LANCET33G) MISC Use to test blood sugar twice daily  . metoprolol succinate (TOPROL-XL) 25 MG 24 hr tablet TAKE 1 TABLET(25 MG) BY MOUTH DAILY WITH OR IMMEDIATELY FOLLOWING A MEAL  . NON FORMULARY Take 1 tablet by mouth at bedtime. HeartZest  . NON FORMULARY Take 1 tablet by mouth daily. CardioRelax AO  . vitamin B-12 (CYANOCOBALAMIN) 500 MCG tablet Take 500 mcg by mouth daily.   . Vitamin D, Ergocalciferol, (DRISDOL) 1.25 MG (50000 UNIT) CAPS capsule Take 50,000 Units by mouth every Sunday.    No current facility-administered medications for this visit. (Other)      REVIEW OF SYSTEMS:    ALLERGIES Allergies  Allergen Reactions  . Cortisone Other (See Comments)    CBG thrown out of control  . Clotrimazole-Betamethasone Other (See Comments)    PAST MEDICAL HISTORY Past Medical History:  Diagnosis Date  . Carpal tunnel syndrome on both sides   . Chest pain   . Coronary artery calcification seen on CAT scan   . Diabetes mellitus without complication (HCC)   . DVT (deep venous thrombosis) (HCC)   . GERD (gastroesophageal reflux disease)   . Headache   . Hypercholesteremia   . Hyperlipidemia   . Hypertension   . PE (pulmonary embolism)   . Shortness of breath dyspnea    PE    Past Surgical History:  Procedure Laterality Date  . BILATERAL CARPAL TUNNEL RELEASE Bilateral   .  CATARACT EXTRACTION W/PHACO Left 08/05/2006   Dr. Gershon Crane  . CATARACT EXTRACTION W/PHACO Right 08/18/2006   Dr. Gershon Crane  . COLONOSCOPY    . COLONOSCOPY WITH PROPOFOL Left 09/20/2019   Procedure: COLONOSCOPY WITH PROPOFOL;  Surgeon: Ronnette Juniper, MD;  Location: [facility];  Service: Gastroenterology;  Laterality: Left;  . COLONOSCOPY WITH PROPOFOL N/A 10/03/2019   Procedure: COLONOSCOPY WITH PROPOFOL;  Surgeon: Wilford Corner, MD;  Location: [facility];  Service: Endoscopy;  Laterality: N/A;  . ELBOW SURGERY     BONE SPURS REMOVED B ELBOWS  . EYE  SURGERY Bilateral    cataracts  . FOOT SURGERY     HEEL SPURS REMOVED B HEELS  . HEMOSTASIS CLIP PLACEMENT  09/20/2019   Procedure: HEMOSTASIS CLIP PLACEMENT;  Surgeon: Ronnette Juniper, MD;  Location: [facility];  Service: Gastroenterology;;  . HEMOSTASIS CLIP PLACEMENT  10/03/2019   Procedure: HEMOSTASIS CLIP PLACEMENT;  Surgeon: Wilford Corner, MD;  Location: [facility];  Service: Endoscopy;;  . HOT HEMOSTASIS N/A 09/20/2019   Procedure: HOT HEMOSTASIS (ARGON PLASMA COAGULATION/BICAP);  Surgeon: Ronnette Juniper, MD;  Location: [facility];  Service: Gastroenterology;  Laterality: N/A;  . I & D EXTREMITY Right 09/18/2014   Procedure: IRRIGATION AND DEBRIDEMENT RIGHT THUMB, open fracture repair;  Surgeon: Linna Hoff, MD;  Location: [facility];  Service: Orthopedics;  Laterality: Right;  . IR IVC FILTER PLMT / S&I Burke Keels GUID/MOD SED  10/03/2019  . LEG SURGERY Left    left thigh table saw accident, "cut to the bone", I&D/suturing  . SHOULDER SURGERY Left    Bone spur  . SPINE SURGERY  09/04/1985   lumbar spine  . SUBMUCOSAL INJECTION  10/03/2019   Procedure: SUBMUCOSAL INJECTION;  Surgeon: Wilford Corner, MD;  Location: [facility];  Service: Endoscopy;;  . TOE SURGERY      FAMILY HISTORY Family History  Problem Relation Age of Onset  . Cancer Mother   . Diabetes Father     SOCIAL HISTORY Social History   Tobacco Use  . Smoking status: Former Smoker    Packs/day: 0.50    Types: Cigarettes    Quit date: 1964    Years since quitting: 58.3  . Smokeless tobacco: Former Systems developer  . Tobacco comment: smoking since 75 years old  Vaping Use  . Vaping Use: Never used  Substance Use Topics  . Alcohol use: No  . Drug use: No         OPHTHALMIC EXAM:  Base Eye Exam    Visual Acuity (ETDRS)      Right Left   Dist [place] 20/40 +1 20/80 +1   Dist ph [place] 20/30 -2 20/50 -2       Tonometry (Tonopen, 8:15 AM)      Right Left   Pressure 18 20       Neuro/Psych    Oriented x3: Yes    Mood/Affect: Normal       Dilation    Both eyes: 2.5% Phenylephrine, 1.0% Mydriacyl @ 8:15 AM        Slit Lamp and Fundus Exam    External Exam      Right Left   External Normal Normal       Slit Lamp Exam      Right Left   Lids/Lashes Normal Normal   Conjunctiva/Sclera White and quiet White and quiet   Cornea Clear Clear   Anterior Chamber Deep and quiet Deep and quiet   Iris Round and reactive Round and reactive  Lens Posterior chamber intraocular lens Posterior chamber intraocular lens   Anterior Vitreous Normal Normal       Fundus Exam      Right Left   Posterior Vitreous Posterior vitreous detachment Posterior vitreous detachment   Disc cilio retinal artery Normal   C/D Ratio 0.35 0.3   Macula Macular thickening superior and temporally , Microaneurysms, Mild clinically significant macular edema, no exudates Microaneurysms, no macular thickening, Focal laser scars, Epiretinal membrane shiny ILM, no topographic distortion left eye   Vessels Severe NPDR Severe NPDR   Periphery Normal Normal          IMAGING AND PROCEDURES  Imaging and Procedures for 01/12/21  OCT, Retina - OU - Both Eyes       Right Eye Quality was good. Scan locations included subfoveal. Central Foveal Thickness: 326. Progression has worsened. Findings include abnormal foveal contour.   Left Eye Quality was good. Scan locations included subfoveal. Central Foveal Thickness: 225. Progression has improved. Findings include abnormal foveal contour.   Notes Minor decrease in CSME superior and superonasal to the fovea of the right eye with minor center involvement.  Will need to repeat intravitreal Avastin OD today and consider focal laser treatment for the extra foveal components of CSME in the near future  OS, no active CSME will continue to observe       Intravitreal Injection, Pharmacologic Agent - OD - Right Eye       Time Out 01/12/2021. 8:43 AM. Confirmed correct patient,  procedure, site, and patient consented.   Anesthesia Topical anesthesia was used. Anesthetic medications included [place] 3.5%.   Procedure Preparation included Tobramycin 0.3%, 10% betadine to eyelids, 5% betadine to ocular surface. A 30 gauge needle was used.   Injection:  2.5 mg Bevacizumab (AVASTIN) 2.5mg /0.26mL SOSY   NDC: 40981-191-47, Lot: [id number]   Route: Intravitreal, Site: Right Eye  Post-op Post injection exam found visual acuity of at least counting fingers. The patient tolerated the procedure well. There were no complications. The patient received written and verbal post procedure care education. Post injection medications were not given.                 ASSESSMENT/PLAN:  Severe nonproliferative diabetic retinopathy of right eye, with macular edema, associated with type 2 diabetes mellitus (HCC) 7 weeks post Avastin OD with an improvement macular thickening center involvement yet still active.  Will need repeat injection today  After visit today and injection Avastin today, will follow-up in 2 weeks and treat the extra foveal portions of CSME,   1 superiorly and temporally  Severe nonproliferative diabetic retinopathy of left eye, with macular edema, associated with type 2 diabetes mellitus ([place]) Well-controlled left eye no recurrence of CSME  Macular pucker, left eye No topographic distortion no impact on acuity OS, will continue to monitor and observe      ICD-10-CM   1. Severe nonproliferative diabetic retinopathy of right eye, with macular edema, associated with type 2 diabetes mellitus (HCC)  E11.3411 OCT, Retina - OU - Both Eyes    Intravitreal Injection, Pharmacologic Agent - OD - Right Eye    bevacizumab (AVASTIN) SOSY 2.5 mg  2. Severe nonproliferative diabetic retinopathy of left eye, with macular edema, associated with type 2 diabetes mellitus ([place])  H08.6578   3. Macular pucker, left eye  H35.372     1.  OD, repeat injection Avastin as CSME has  improved from the extra foveal locations of leakage.  Follow-up in 2 weeks for  treatment of these extra foveal areas of leakage with focal laser photocoagulation in an attempt to decrease treatment burden long-term  2.  CSME OS has maintained its resolution stage post focal laser treatment in the past.  Minor epiretinal membrane OS, no impact on acuity observe  3.  Severe NPDR OU has remained stable  Ophthalmic Meds Ordered this visit:  Meds ordered this encounter  Medications  . bevacizumab (AVASTIN) SOSY 2.5 mg       Return in about 2 weeks (around 01/26/2021) for dilate, OD, FOCAL.  There are no Patient Instructions on file for this visit.   Explained the diagnoses, plan, and follow up with the patient and they expressed understanding.  Patient expressed understanding of the importance of proper follow up care.   Clent Demark Jozey Janco M.D. Diseases & Surgery of the Retina and Vitreous [facility] 01/12/21     Abbreviations: M myopia (nearsighted); A astigmatism; H hyperopia (farsighted); P presbyopia; Mrx spectacle prescription;  CTL contact lenses; OD right eye; OS left eye; OU both eyes  XT exotropia; ET esotropia; PEK punctate epithelial keratitis; PEE punctate epithelial erosions; DES dry eye syndrome; MGD meibomian gland dysfunction; ATs artificial tears; PFAT's preservative free artificial tears; [place] nuclear sclerotic cataract; PSC posterior subcapsular cataract; ERM epi-retinal membrane; PVD posterior vitreous detachment; RD retinal detachment; DM diabetes mellitus; DR diabetic retinopathy; NPDR non-proliferative diabetic retinopathy; PDR proliferative diabetic retinopathy; CSME clinically significant macular edema; DME diabetic macular edema; dbh dot blot hemorrhages; CWS cotton wool spot; POAG primary open angle glaucoma; C/D cup-to-disc ratio; HVF humphrey visual field; GVF goldmann visual field; OCT optical coherence tomography; IOP intraocular pressure; BRVO  Branch retinal vein occlusion; CRVO central retinal vein occlusion; CRAO central retinal artery occlusion; BRAO branch retinal artery occlusion; RT retinal tear; SB scleral buckle; PPV pars plana vitrectomy; VH Vitreous hemorrhage; PRP panretinal laser photocoagulation; IVK intravitreal kenalog; VMT vitreomacular traction; MH Macular hole;  NVD neovascularization of the disc; NVE neovascularization elsewhere; AREDS age related eye disease study; ARMD age related macular degeneration; POAG primary open angle glaucoma; EBMD epithelial/anterior basement membrane dystrophy; ACIOL anterior chamber intraocular lens; IOL intraocular lens; PCIOL posterior chamber intraocular lens; Phaco/IOL phacoemulsification with intraocular lens placement; [place] photorefractive keratectomy; LASIK laser assisted in situ keratomileusis; HTN hypertension; DM diabetes mellitus; COPD chronic obstructive pulmonary disease

## 2021-01-12 NOTE — Assessment & Plan Note (Addendum)
7 weeks post Avastin OD with an improvement macular thickening center involvement yet still active.  Will need repeat injection today  After visit today and injection Avastin today, will follow-up in 2 weeks and treat the extra foveal portions of CSME,   1 superiorly and temporally

## 2021-01-13 ENCOUNTER — Other Ambulatory Visit (HOSPITAL_COMMUNITY): Payer: Self-pay

## 2021-01-13 MED ORDER — MOLNUPIRAVIR 200 MG PO CAPS
800.0000 mg | ORAL_CAPSULE | Freq: Two times a day (BID) | ORAL | 0 refills | Status: AC
  Filled 2021-01-13: qty 40, 5d supply, fill #0

## 2021-01-13 MED ORDER — AZITHROMYCIN 250 MG PO TABS
ORAL_TABLET | Freq: Every day | ORAL | 0 refills | Status: DC
  Filled 2021-01-13: qty 6, 5d supply, fill #0

## 2021-01-24 ENCOUNTER — Other Ambulatory Visit: Payer: Self-pay | Admitting: Cardiology

## 2021-01-27 ENCOUNTER — Ambulatory Visit (INDEPENDENT_AMBULATORY_CARE_PROVIDER_SITE_OTHER): Payer: HMO | Admitting: Ophthalmology

## 2021-01-27 ENCOUNTER — Other Ambulatory Visit: Payer: Self-pay

## 2021-01-27 ENCOUNTER — Encounter (INDEPENDENT_AMBULATORY_CARE_PROVIDER_SITE_OTHER): Payer: Self-pay | Admitting: Ophthalmology

## 2021-01-27 DIAGNOSIS — E113411 Type 2 diabetes mellitus with severe nonproliferative diabetic retinopathy with macular edema, right eye: Secondary | ICD-10-CM

## 2021-01-27 NOTE — Progress Notes (Signed)
01/27/2021     CHIEF COMPLAINT Patient presents for Retina Follow Up (2 week focal OD and OCT/Pt states [facility] OU stable since last visit. Pt denies FOL, floaters, or ocular pain OU. /LBS:110)   HISTORY OF PRESENT ILLNESS: Matthew Zimmerman is a 75 y.o. male who presents to the clinic today for:   HPI    Retina Follow Up    Diagnosis: Diabetic Retinopathy   Laterality: right eye   Onset: 2   Severity: mild   Duration: 2 weeks   Course: stable   Comments: 2 week focal OD and OCT Pt states VA OU stable since last visit. Pt denies FOL, floaters, or ocular pain OU.  LBS:110       Last edited by Kendra Opitz, COA on 01/27/2021  9:19 AM. (History)      Referring physician: Jani Gravel, MD [place],  [place] [address]  HISTORICAL INFORMATION:   Selected notes from the MEDICAL RECORD NUMBER    Lab Results  Component Value Date   HGBA1C 8.5 (H) 08/05/2020     CURRENT MEDICATIONS: No current outpatient medications on file. (Ophthalmic Drugs)   No current facility-administered medications for this visit. (Ophthalmic Drugs)   Current Outpatient Medications (Other)  Medication Sig  . acetaminophen (TYLENOL) 650 MG CR tablet as needed.  [name] apixaban (ELIQUIS) 5 MG TABS tablet Take 1 tablet (5 mg total) by mouth 2 (two) times daily.  [name] atorvastatin (LIPITOR) 40 MG tablet TAKE 1 TABLET(40 MG) BY MOUTH AT BEDTIME  . augmented betamethasone dipropionate (DIPROLENE-AF) 0.05 % cream Apply 1 application topically daily as needed (itchy skin near bottom).   [name] azithromycin (ZITHROMAX) 250 MG tablet Take 2 tablets by mouth on day 1 then 1 tab daily for 5 days  . BD INSULIN SYRINGE U/F 31G X 5/16" 0.5 ML MISC 2 (two) times daily.  . clindamycin (CLEOCIN T) 1 % external solution Apply 1 application topically in the morning and at bedtime.  . clotrimazole-betamethasone (LOTRISONE) cream Apply 1 application topically 2 (two) times daily as needed (itching).   .  dapagliflozin propanediol (FARXIGA) 5 MG TABS tablet Take 5 mg by mouth daily.  . diclofenac sodium (VOLTAREN) 1 % GEL Apply 1 application topically 4 (four) times daily as needed (pain).  [name] doxycycline (ADOXA) 100 MG tablet Take 100 mg by mouth daily.  . furosemide (LASIX) 80 MG tablet Take 0.5 tablets (40 mg total) by mouth daily. (Patient taking differently: Take 80 mg by mouth daily.)  . glucose blood test strip Use to test blood sugar twice a day  . insulin NPH-regular Human (NOVOLIN 70/30) (70-30) 100 UNIT/ML injection Inject 30 Units into the skin 2 (two) times daily with a meal.   . Lancets (ONETOUCH DELICA PLUS [id number]) MISC Use to test blood sugar twice daily  . metoprolol succinate (TOPROL-XL) 25 MG 24 hr tablet TAKE 1 TABLET BY MOUTH EVERY DAY AFTER DINNER  . NON FORMULARY Take 1 tablet by mouth at bedtime. HeartZest  . NON FORMULARY Take 1 tablet by mouth daily. CardioRelax AO  . vitamin B-12 (CYANOCOBALAMIN) 500 MCG tablet Take 500 mcg by mouth daily.   . Vitamin D, Ergocalciferol, (DRISDOL) 1.25 MG (50000 UNIT) CAPS capsule Take 50,000 Units by mouth every Sunday.    No current facility-administered medications for this visit. (Other)      REVIEW OF SYSTEMS:    ALLERGIES Allergies  Allergen Reactions  . Cortisone Other (See Comments)  CBG thrown out of control  . Clotrimazole-Betamethasone Other (See Comments)    PAST MEDICAL HISTORY Past Medical History:  Diagnosis Date  . Carpal tunnel syndrome on both sides   . Chest pain   . Coronary artery calcification seen on CAT scan   . Diabetes mellitus without complication ([place])   . DVT (deep venous thrombosis) ([place])   . GERD (gastroesophageal reflux disease)   . Headache   . Hypercholesteremia   . Hyperlipidemia   . Hypertension   . PE (pulmonary embolism)   . Shortness of breath dyspnea    PE    Past Surgical History:  Procedure Laterality Date  . BILATERAL CARPAL TUNNEL RELEASE Bilateral   . CATARACT  EXTRACTION W/PHACO Left 08/05/2006   Dr. Gershon Crane  . CATARACT EXTRACTION W/PHACO Right 08/18/2006   Dr. Gershon Crane  . COLONOSCOPY    . COLONOSCOPY WITH PROPOFOL Left 09/20/2019   Procedure: COLONOSCOPY WITH PROPOFOL;  Surgeon: Ronnette Juniper, MD;  Location: [facility];  Service: Gastroenterology;  Laterality: Left;  . COLONOSCOPY WITH PROPOFOL N/A 10/03/2019   Procedure: COLONOSCOPY WITH PROPOFOL;  Surgeon: Wilford Corner, MD;  Location: [facility];  Service: Endoscopy;  Laterality: N/A;  . ELBOW SURGERY     BONE SPURS REMOVED B ELBOWS  . EYE SURGERY Bilateral    cataracts  . FOOT SURGERY     HEEL SPURS REMOVED B HEELS  . HEMOSTASIS CLIP PLACEMENT  09/20/2019   Procedure: HEMOSTASIS CLIP PLACEMENT;  Surgeon: Ronnette Juniper, MD;  Location: [facility];  Service: Gastroenterology;;  . HEMOSTASIS CLIP PLACEMENT  10/03/2019   Procedure: HEMOSTASIS CLIP PLACEMENT;  Surgeon: Wilford Corner, MD;  Location: [facility];  Service: Endoscopy;;  . HOT HEMOSTASIS N/A 09/20/2019   Procedure: HOT HEMOSTASIS (ARGON PLASMA COAGULATION/BICAP);  Surgeon: Ronnette Juniper, MD;  Location: [facility];  Service: Gastroenterology;  Laterality: N/A;  . I & D EXTREMITY Right 09/18/2014   Procedure: IRRIGATION AND DEBRIDEMENT RIGHT THUMB, open fracture repair;  Surgeon: Linna Hoff, MD;  Location: [facility];  Service: Orthopedics;  Laterality: Right;  . IR IVC FILTER PLMT / S&I Burke Keels GUID/MOD SED  10/03/2019  . LEG SURGERY Left    left thigh table saw accident, "cut to the bone", I&D/suturing  . SHOULDER SURGERY Left    Bone spur  . SPINE SURGERY  09/04/1985   lumbar spine  . SUBMUCOSAL INJECTION  10/03/2019   Procedure: SUBMUCOSAL INJECTION;  Surgeon: Wilford Corner, MD;  Location: [facility];  Service: Endoscopy;;  . TOE SURGERY      FAMILY HISTORY Family History  Problem Relation Age of Onset  . Cancer Mother   . Diabetes Father     SOCIAL HISTORY Social History   Tobacco Use  . Smoking status: Former  Smoker    Packs/day: 0.50    Types: Cigarettes    Quit date: 1964    Years since quitting: 58.4  . Smokeless tobacco: Former Systems developer  . Tobacco comment: smoking since 75 years old  Vaping Use  . Vaping Use: Never used  Substance Use Topics  . Alcohol use: No  . Drug use: No         OPHTHALMIC EXAM: Base Eye Exam    Visual Acuity (ETDRS)      Right Left   Dist [place] 20/30 -1 20/60   Dist ph Progress NI 20/50 -1       Tonometry (Tonopen, 9:23 AM)      Right Left   Pressure 19 18  Pupils      Pupils Dark Light Shape React APD   Right PERRL 3 3 Round Minimal None   Left PERRL 3 3 Round Minimal None       Visual Fields (Counting fingers)      Left Right    Full Full       Extraocular Movement      Right Left    Full Full       Neuro/Psych    Oriented x3: Yes   Mood/Affect: Normal       Dilation    Right eye: 1.0% Mydriacyl, 2.5% Phenylephrine @ 9:23 AM        Slit Lamp and Fundus Exam    External Exam      Right Left   External Normal Normal       Slit Lamp Exam      Right Left   Lids/Lashes Normal Normal   Conjunctiva/Sclera White and quiet White and quiet   Cornea Clear Clear   Anterior Chamber Deep and quiet Deep and quiet   Iris Round and reactive Round and reactive   Lens Posterior chamber intraocular lens Posterior chamber intraocular lens   Anterior Vitreous Normal Normal       Fundus Exam      Right Left   Posterior Vitreous Posterior vitreous detachment Posterior vitreous detachment   Disc cilio retinal artery Normal   C/D Ratio 0.35 0.3   Macula Macular thickening superior and temporally , Microaneurysms, Mild clinically significant macular edema, no exudates Microaneurysms, no macular thickening, Focal laser scars, Epiretinal membrane shiny ILM, no topographic distortion left eye   Vessels Severe NPDR Severe NPDR   Periphery Normal Normal          IMAGING AND PROCEDURES  Imaging and Procedures for 01/27/21  OCT, Retina - OU - Both  Eyes       Right Eye Quality was good. Scan locations included subfoveal. Central Foveal Thickness: 325. Progression has worsened. Findings include abnormal foveal contour.   Left Eye Quality was good. Scan locations included subfoveal. Central Foveal Thickness: 223. Progression has improved. Findings include abnormal foveal contour.   Notes Minor decrease in CSME superior and superonasal to the fovea of the right eye with minor center involvement.   focal laser treatment for the extra foveal components of CSME today  OS, no active CSME will continue to observe       Focal Laser - OD - Right Eye       Time Out Confirmed correct patient, procedure, site, and patient consented.   Anesthesia Topical anesthesia was used. Anesthetic medications included Proparacaine 0.5%.   Laser Information The type of laser was diode. Color was yellow. The duration in seconds was 0.1. The spot size was 100 microns. Laser power was 80. Total spots was 24.   Post-op The patient tolerated the procedure well. There were no complications. The patient received written and verbal post procedure care education.   Notes Local applied to extra foveal CSME location                ASSESSMENT/PLAN:  Severe nonproliferative diabetic retinopathy of right eye, with macular edema, associated with type 2 diabetes mellitus (HCC) Residual Focal CSME OD center involved, with extra foveal causation, focal laser treatment today superior and temporal to [place]   1. Severe nonproliferative diabetic retinopathy of right eye, with macular edema, associated with type 2 diabetes mellitus ([place])  G95.6213  OCT, Retina - OU - Both Eyes    Focal Laser - OD - Right Eye    1.  OD looks great, less overall CSME, focal laser treatment applied superior and separately temporal to fovea to regions of causation of center involved CSME  2.  3.  Ophthalmic Meds Ordered this visit:  No orders of the  defined types were placed in this encounter.      Return in about 4 months (around 05/30/2021) for OCT, DILATE OU.  There are no Patient Instructions on file for this visit.   Explained the diagnoses, plan, and follow up with the patient and they expressed understanding.  Patient expressed understanding of the importance of proper follow up care.   Clent Demark Walther [place] M.D. Diseases & Surgery of the Retina and Vitreous Retina [facility] Diabetic [place] 01/27/21     Abbreviations: M myopia (nearsighted); A astigmatism; H hyperopia (farsighted); P presbyopia; Mrx spectacle prescription;  CTL contact lenses; OD right eye; OS left eye; OU both eyes  XT exotropia; ET esotropia; PEK punctate epithelial keratitis; PEE punctate epithelial erosions; DES dry eye syndrome; MGD meibomian gland dysfunction; ATs artificial tears; PFAT's preservative free artificial tears; [place] nuclear sclerotic cataract; PSC posterior subcapsular cataract; ERM epi-retinal membrane; PVD posterior vitreous detachment; RD retinal detachment; DM diabetes mellitus; DR diabetic retinopathy; NPDR non-proliferative diabetic retinopathy; PDR proliferative diabetic retinopathy; CSME clinically significant macular edema; DME diabetic macular edema; dbh dot blot hemorrhages; CWS cotton wool spot; POAG primary open angle glaucoma; C/D cup-to-disc ratio; HVF humphrey visual field; GVF goldmann visual field; OCT optical coherence tomography; IOP intraocular pressure; BRVO Branch retinal vein occlusion; CRVO central retinal vein occlusion; CRAO central retinal artery occlusion; BRAO branch retinal artery occlusion; RT retinal tear; SB scleral buckle; PPV pars plana vitrectomy; VH Vitreous hemorrhage; PRP panretinal laser photocoagulation; IVK intravitreal kenalog; VMT vitreomacular traction; MH Macular hole;  NVD neovascularization of the disc; NVE neovascularization elsewhere; AREDS age related eye disease study; ARMD age related macular degeneration;  POAG primary open angle glaucoma; EBMD epithelial/anterior basement membrane dystrophy; ACIOL anterior chamber intraocular lens; IOL intraocular lens; PCIOL posterior chamber intraocular lens; Phaco/IOL phacoemulsification with intraocular lens placement; [place] photorefractive keratectomy; LASIK laser assisted in situ keratomileusis; HTN hypertension; DM diabetes mellitus; COPD chronic obstructive pulmonary disease

## 2021-01-27 NOTE — Assessment & Plan Note (Addendum)
Residual Focal CSME OD center involved, with extra foveal causation, focal laser treatment today superior and temporal to [place]

## 2021-02-09 DIAGNOSIS — I480 Paroxysmal atrial fibrillation: Secondary | ICD-10-CM | POA: Diagnosis not present

## 2021-02-09 DIAGNOSIS — E1122 Type 2 diabetes mellitus with diabetic chronic kidney disease: Secondary | ICD-10-CM | POA: Diagnosis not present

## 2021-02-09 DIAGNOSIS — E1165 Type 2 diabetes mellitus with hyperglycemia: Secondary | ICD-10-CM | POA: Diagnosis not present

## 2021-02-09 DIAGNOSIS — E118 Type 2 diabetes mellitus with unspecified complications: Secondary | ICD-10-CM | POA: Diagnosis not present

## 2021-02-09 DIAGNOSIS — Z794 Long term (current) use of insulin: Secondary | ICD-10-CM | POA: Diagnosis not present

## 2021-02-09 DIAGNOSIS — I2510 Atherosclerotic heart disease of native coronary artery without angina pectoris: Secondary | ICD-10-CM | POA: Diagnosis not present

## 2021-02-09 DIAGNOSIS — N1831 Chronic kidney disease, stage 3a: Secondary | ICD-10-CM | POA: Diagnosis not present

## 2021-02-09 DIAGNOSIS — E785 Hyperlipidemia, unspecified: Secondary | ICD-10-CM | POA: Diagnosis not present

## 2021-02-09 DIAGNOSIS — Z6829 Body mass index (BMI) 29.0-29.9, adult: Secondary | ICD-10-CM | POA: Diagnosis not present

## 2021-02-09 DIAGNOSIS — I10 Essential (primary) hypertension: Secondary | ICD-10-CM | POA: Diagnosis not present

## 2021-02-14 DIAGNOSIS — R972 Elevated prostate specific antigen [PSA]: Secondary | ICD-10-CM | POA: Diagnosis not present

## 2021-02-16 DIAGNOSIS — R972 Elevated prostate specific antigen [PSA]: Secondary | ICD-10-CM | POA: Diagnosis not present

## 2021-02-16 DIAGNOSIS — N281 Cyst of kidney, acquired: Secondary | ICD-10-CM | POA: Diagnosis not present

## 2021-02-16 DIAGNOSIS — N400 Enlarged prostate without lower urinary tract symptoms: Secondary | ICD-10-CM | POA: Diagnosis not present

## 2021-02-18 ENCOUNTER — Ambulatory Visit: Payer: HMO | Admitting: Cardiology

## 2021-03-15 DIAGNOSIS — N1831 Chronic kidney disease, stage 3a: Secondary | ICD-10-CM | POA: Diagnosis not present

## 2021-03-15 DIAGNOSIS — Z794 Long term (current) use of insulin: Secondary | ICD-10-CM | POA: Diagnosis not present

## 2021-03-15 DIAGNOSIS — I10 Essential (primary) hypertension: Secondary | ICD-10-CM | POA: Diagnosis not present

## 2021-03-15 DIAGNOSIS — E1122 Type 2 diabetes mellitus with diabetic chronic kidney disease: Secondary | ICD-10-CM | POA: Diagnosis not present

## 2021-03-15 DIAGNOSIS — Z6829 Body mass index (BMI) 29.0-29.9, adult: Secondary | ICD-10-CM | POA: Diagnosis not present

## 2021-03-15 DIAGNOSIS — I480 Paroxysmal atrial fibrillation: Secondary | ICD-10-CM | POA: Diagnosis not present

## 2021-03-15 DIAGNOSIS — E1165 Type 2 diabetes mellitus with hyperglycemia: Secondary | ICD-10-CM | POA: Diagnosis not present

## 2021-03-15 DIAGNOSIS — E118 Type 2 diabetes mellitus with unspecified complications: Secondary | ICD-10-CM | POA: Diagnosis not present

## 2021-03-15 DIAGNOSIS — E785 Hyperlipidemia, unspecified: Secondary | ICD-10-CM | POA: Diagnosis not present

## 2021-03-15 DIAGNOSIS — I2510 Atherosclerotic heart disease of native coronary artery without angina pectoris: Secondary | ICD-10-CM | POA: Diagnosis not present

## 2021-04-11 DIAGNOSIS — E785 Hyperlipidemia, unspecified: Secondary | ICD-10-CM | POA: Diagnosis not present

## 2021-04-11 DIAGNOSIS — N1831 Chronic kidney disease, stage 3a: Secondary | ICD-10-CM | POA: Diagnosis not present

## 2021-04-11 DIAGNOSIS — Z794 Long term (current) use of insulin: Secondary | ICD-10-CM | POA: Diagnosis not present

## 2021-04-11 DIAGNOSIS — I2510 Atherosclerotic heart disease of native coronary artery without angina pectoris: Secondary | ICD-10-CM | POA: Diagnosis not present

## 2021-04-11 DIAGNOSIS — Z6829 Body mass index (BMI) 29.0-29.9, adult: Secondary | ICD-10-CM | POA: Diagnosis not present

## 2021-04-11 DIAGNOSIS — I10 Essential (primary) hypertension: Secondary | ICD-10-CM | POA: Diagnosis not present

## 2021-04-11 DIAGNOSIS — E1165 Type 2 diabetes mellitus with hyperglycemia: Secondary | ICD-10-CM | POA: Diagnosis not present

## 2021-04-11 DIAGNOSIS — E118 Type 2 diabetes mellitus with unspecified complications: Secondary | ICD-10-CM | POA: Diagnosis not present

## 2021-04-11 DIAGNOSIS — E1122 Type 2 diabetes mellitus with diabetic chronic kidney disease: Secondary | ICD-10-CM | POA: Diagnosis not present

## 2021-04-11 DIAGNOSIS — I480 Paroxysmal atrial fibrillation: Secondary | ICD-10-CM | POA: Diagnosis not present

## 2021-04-25 ENCOUNTER — Other Ambulatory Visit: Payer: Self-pay | Admitting: Cardiology

## 2021-04-29 DIAGNOSIS — I10 Essential (primary) hypertension: Secondary | ICD-10-CM | POA: Diagnosis not present

## 2021-04-29 DIAGNOSIS — E7800 Pure hypercholesterolemia, unspecified: Secondary | ICD-10-CM | POA: Diagnosis not present

## 2021-04-30 LAB — COMPREHENSIVE METABOLIC PANEL
ALT: 36 IU/L (ref 0–44)
AST: 24 IU/L (ref 0–40)
Albumin/Globulin Ratio: 1.7 (ref 1.2–2.2)
Albumin: 4.5 g/dL (ref 3.7–4.7)
Alkaline Phosphatase: 112 IU/L (ref 44–121)
BUN/Creatinine Ratio: 13 (ref 10–24)
BUN: 20 mg/dL (ref 8–27)
Bilirubin Total: 0.5 mg/dL (ref 0.0–1.2)
CO2: 21 mmol/L (ref 20–29)
Calcium: 9.6 mg/dL (ref 8.6–10.2)
Chloride: 105 mmol/L (ref 96–106)
Creatinine, Ser: 1.49 mg/dL — ABNORMAL HIGH (ref 0.76–1.27)
Globulin, Total: 2.7 g/dL (ref 1.5–4.5)
Glucose: 185 mg/dL — ABNORMAL HIGH (ref 65–99)
Potassium: 5.2 mmol/L (ref 3.5–5.2)
Sodium: 140 mmol/L (ref 134–144)
Total Protein: 7.2 g/dL (ref 6.0–8.5)
eGFR: 49 mL/min/{1.73_m2} — ABNORMAL LOW (ref >59)

## 2021-04-30 LAB — LIPID PANEL WITH LDL/HDL RATIO
Cholesterol, Total: 126 mg/dL (ref 100–199)
HDL: 56 mg/dL (ref >39)
LDL Chol Calc (NIH): 58 mg/dL (ref 0–99)
LDL/HDL Ratio: 1 ratio (ref 0.0–3.6)
Triglycerides: 51 mg/dL (ref 0–149)
VLDL Cholesterol Cal: 12 mg/dL (ref 5–40)

## 2021-04-30 LAB — MAGNESIUM: Magnesium: 2.4 mg/dL — ABNORMAL HIGH (ref 1.6–2.3)

## 2021-04-30 LAB — LDL CHOLESTEROL, DIRECT: LDL Direct: 58 mg/dL (ref 0–99)

## 2021-05-02 NOTE — Progress Notes (Signed)
No answer left a vm

## 2021-05-03 NOTE — Progress Notes (Signed)
No answer left a vm

## 2021-05-04 ENCOUNTER — Encounter: Payer: Self-pay | Admitting: Cardiology

## 2021-05-04 ENCOUNTER — Ambulatory Visit: Payer: HMO | Admitting: Cardiology

## 2021-05-04 ENCOUNTER — Other Ambulatory Visit: Payer: Self-pay

## 2021-05-04 VITALS — BP 125/67 | HR 64 | Resp 16 | Ht 68.0 in | Wt 200.0 lb

## 2021-05-04 DIAGNOSIS — R0600 Dyspnea, unspecified: Secondary | ICD-10-CM

## 2021-05-04 DIAGNOSIS — Z86711 Personal history of pulmonary embolism: Secondary | ICD-10-CM | POA: Diagnosis not present

## 2021-05-04 DIAGNOSIS — E7800 Pure hypercholesterolemia, unspecified: Secondary | ICD-10-CM | POA: Diagnosis not present

## 2021-05-04 DIAGNOSIS — I2510 Atherosclerotic heart disease of native coronary artery without angina pectoris: Secondary | ICD-10-CM

## 2021-05-04 DIAGNOSIS — E1139 Type 2 diabetes mellitus with other diabetic ophthalmic complication: Secondary | ICD-10-CM

## 2021-05-04 DIAGNOSIS — R0609 Other forms of dyspnea: Secondary | ICD-10-CM

## 2021-05-04 DIAGNOSIS — I10 Essential (primary) hypertension: Secondary | ICD-10-CM | POA: Diagnosis not present

## 2021-05-04 DIAGNOSIS — I4510 Unspecified right bundle-branch block: Secondary | ICD-10-CM

## 2021-05-04 DIAGNOSIS — I825Y3 Chronic embolism and thrombosis of unspecified deep veins of proximal lower extremity, bilateral: Secondary | ICD-10-CM

## 2021-05-04 DIAGNOSIS — Z95828 Presence of other vascular implants and grafts: Secondary | ICD-10-CM | POA: Diagnosis not present

## 2021-05-04 DIAGNOSIS — Z794 Long term (current) use of insulin: Secondary | ICD-10-CM | POA: Diagnosis not present

## 2021-05-04 MED ORDER — METOPROLOL SUCCINATE ER 25 MG PO TB24: ORAL_TABLET | ORAL | 0 refills | Status: DC

## 2021-05-04 NOTE — Progress Notes (Signed)
Matthew Zimmerman Date of Birth: [birth year] MRN: [id number] Primary Care Provider:Kim, Jeneen Rinks, MD Former Cardiology Providers: Jeri Lager, APRN, FNP-C Primary Cardiologist: Rex Kras, DO, [facility] (established care 03/09/2020)   Date: 05/04/21 Last Office Visit: 02/28/20222  Chief Complaint  Patient presents with    coronary artery calcification   Dyspnea on exertion   Follow-up    HPI  Matthew Zimmerman is a 75 y.o.  male who presents to the office with a chief complaint of " 15-monthfollow-up for coronary calcium, hypertension, hyperlipidemia." Patient's past medical history and cardiovascular risk factors include: Hypertension, type 2 diabetes mellitus with complications, hyperlipidemia, former smoker, history of DVT/PE and status post IVC filter on oral anticoagulation, coronary artery calcification by CT scan, advanced age.  Patient is being followed by our practice for the management of hypertension and hyperlipidemia.  Last office visit patient is doing well from a cardiovascular standpoint.  No hospitalizations or urgent care visits for cardiovascular symptoms.  He remains on Eliquis given his history of DVT/PE.  Oral anticoagulation is managed by other members of the care team.  Most recent lipid profile notes excellent LDL control, 58 mg/dL.  His blood pressures are also very well controlled.  His recent blood work noted elevation in serum creatinine concerning for acute kidney injury.  Medications reconciled.  Patient states that he was recently told by his primary care provider not to take Lasix given renal function/dehydration.    Of note, patient states that his wife recently had a stroke and he has now become the primary caretaker of her since his last office visit.  ALLERGIES: Allergies  Allergen Reactions   Cortisone Other (See Comments)    CBG thrown out of control   Clotrimazole-Betamethasone Other (See Comments)     MEDICATION LIST PRIOR TO VISIT: Current  Outpatient Medications on File Prior to Visit  Medication Sig Dispense Refill   acetaminophen (TYLENOL) 650 MG CR tablet as needed.     apixaban (ELIQUIS) 5 MG TABS tablet Take 1 tablet (5 mg total) by mouth 2 (two) times daily.     atorvastatin (LIPITOR) 40 MG tablet TAKE 1 TABLET(40 MG) BY MOUTH AT BEDTIME 90 tablet 1   BD INSULIN SYRINGE U/F 31G X 5/16" 0.5 ML MISC 2 (two) times daily.     clindamycin (CLEOCIN T) 1 % external solution Apply topically 2 (two) times daily.     dapagliflozin propanediol (FARXIGA) 10 MG TABS tablet Take 5 mg by mouth daily.     diclofenac sodium (VOLTAREN) 1 % GEL Apply 1 application topically 4 (four) times daily as needed (pain).     glucose blood test strip Use to test blood sugar twice a day     insulin NPH-regular Human (NOVOLIN 70/30) (70-30) 100 UNIT/ML injection Inject 30 Units into the skin 2 (two) times daily with a meal.      Lancets (ONETOUCH DELICA PLUS [id number] MISC Use to test blood sugar twice daily     NON FORMULARY Take 1 tablet by mouth at bedtime. HeartZest     NON FORMULARY Take 1 tablet by mouth daily. CardioRelax AO     vitamin B-12 (CYANOCOBALAMIN) 500 MCG tablet Take 500 mcg by mouth daily.      Vitamin D, Ergocalciferol, (DRISDOL) 1.25 MG (50000 UNIT) CAPS capsule Take 50,000 Units by mouth every Sunday.      No current facility-administered medications on file prior to visit.    PAST MEDICAL HISTORY: Past Medical History:  Diagnosis Date  Carpal tunnel syndrome on both sides    Chest pain    Coronary artery calcification seen on CAT scan    Diabetes mellitus without complication (HCC)    DVT (deep venous thrombosis) (HCC)    GERD (gastroesophageal reflux disease)    Headache    Hypercholesteremia    Hyperlipidemia    Hypertension    PE (pulmonary embolism)    Shortness of breath dyspnea    PE     PAST SURGICAL HISTORY: Past Surgical History:  Procedure Laterality Date   BILATERAL CARPAL TUNNEL RELEASE Bilateral     CATARACT EXTRACTION W/PHACO Left 08/05/2006   Dr. Gershon Crane   CATARACT EXTRACTION W/PHACO Right 08/18/2006   Dr. Gershon Crane   COLONOSCOPY     COLONOSCOPY WITH PROPOFOL Left 09/20/2019   Procedure: COLONOSCOPY WITH PROPOFOL;  Surgeon: Ronnette Juniper, MD;  Location: [facility];  Service: Gastroenterology;  Laterality: Left;   COLONOSCOPY WITH PROPOFOL N/A 10/03/2019   Procedure: COLONOSCOPY WITH PROPOFOL;  Surgeon: Wilford Corner, MD;  Location: [facility];  Service: Endoscopy;  Laterality: N/A;   ELBOW SURGERY     BONE SPURS REMOVED B ELBOWS   EYE SURGERY Bilateral    cataracts   FOOT SURGERY     HEEL SPURS REMOVED B HEELS   HEMOSTASIS CLIP PLACEMENT  09/20/2019   Procedure: HEMOSTASIS CLIP PLACEMENT;  Surgeon: Ronnette Juniper, MD;  Location: [facility];  Service: Gastroenterology;;   HEMOSTASIS CLIP PLACEMENT  10/03/2019   Procedure: HEMOSTASIS CLIP PLACEMENT;  Surgeon: Wilford Corner, MD;  Location: [facility];  Service: Endoscopy;;   HOT HEMOSTASIS N/A 09/20/2019   Procedure: HOT HEMOSTASIS (ARGON PLASMA COAGULATION/BICAP);  Surgeon: Ronnette Juniper, MD;  Location: [facility];  Service: Gastroenterology;  Laterality: N/A;   I & D EXTREMITY Right 09/18/2014   Procedure: IRRIGATION AND DEBRIDEMENT RIGHT THUMB, open fracture repair;  Surgeon: Linna Hoff, MD;  Location: [place];  Service: Orthopedics;  Laterality: Right;   IR IVC FILTER PLMT / S&I /IMG GUID/MOD SED  10/03/2019   LEG SURGERY Left    left thigh table saw accident, "cut to the bone", I&D/suturing   SHOULDER SURGERY Left    Bone spur   SPINE SURGERY  09/04/1985   lumbar spine   SUBMUCOSAL INJECTION  10/03/2019   Procedure: SUBMUCOSAL INJECTION;  Surgeon: Wilford Corner, MD;  Location: [facility];  Service: Endoscopy;;   TOE SURGERY      FAMILY HISTORY: The patient's family history includes Cancer in his mother; Diabetes in his father.   SOCIAL HISTORY:  The patient  reports that he quit smoking about 58 years ago.  His smoking use included cigarettes. He smoked an average of .5 packs per day. He has quit using smokeless tobacco. He reports that he does not drink alcohol and does not use drugs.  Review of Systems  Constitutional: Negative for chills and fever.  HENT:  Negative for hoarse voice and nosebleeds.   Eyes:  Negative for discharge, double vision and pain.  Cardiovascular:  Positive for dyspnea on exertion (chronic and stable). Negative for chest pain, claudication, leg swelling, near-syncope, orthopnea, palpitations, paroxysmal nocturnal dyspnea and syncope.  Respiratory:  Negative for hemoptysis and shortness of breath.   Musculoskeletal:  Negative for muscle cramps and myalgias.  Gastrointestinal:  Negative for abdominal pain, constipation, diarrhea, hematemesis, hematochezia, melena, nausea and vomiting.  Neurological:  Negative for dizziness and light-headedness.   PHYSICAL EXAM: Vitals with BMI 05/04/2021 11/01/2020 08/20/2020  Height '5\' 8"'$  '5\' 8"'$  5' 8.5"  Weight 200 lbs 203 lbs 200 lbs  BMI 30.42 [id number] [id number]  Systolic 0000000 A999333 A999333  Diastolic 67 65 66  Pulse 64 61 63   CONSTITUTIONAL: Well-developed and well-nourished. No acute distress.  SKIN: Skin is warm and dry. No rash noted. No cyanosis. No pallor. No jaundice HEAD: Normocephalic and atraumatic.  EYES: No scleral icterus MOUTH/THROAT: Moist oral membranes.  NECK: No JVD present. No thyromegaly noted. No carotid bruits  LYMPHATIC: No visible cervical adenopathy.  CHEST Normal respiratory effort. No intercostal retractions  LUNGS: Clear to auscultation bilaterally.  No stridor. No wheezes. No rales.  CARDIOVASCULAR: Regular rate and rhythm, positive S1-S2, no murmurs rubs or gallops appreciated. ABDOMINAL: Obese, soft, nontender, nondistended, positive bowel sounds in all 4 quadrants.  No apparent ascites.  EXTREMITIES: No peripheral edema.  2+ bilateral femoral pulses, 1+ bilateral popliteal and dorsalis pedis  pulses. HEMATOLOGIC: No significant bruising NEUROLOGIC: Oriented to person, place, and time. Nonfocal. Normal muscle tone.  PSYCHIATRIC: Normal mood and affect. Normal behavior. Cooperative  RADIOLOGY: CT of abdomen 01/16/2018:  Aortic atherosclerosis, in addition to least 2 vessel coronary artery disease.  There are calcifications of the aortic valve.   CTA of chest 02/25/2019: Small pulmonary embolus in right lower lobe pulmonary arterial branches.   CARDIAC DATABASE: EKG: 05/04/2021: Normal sinus rhythm, first-degree AV block, 62 bpm, right bundle branch block, without underlying injury pattern  Echocardiogram: 05/05/2019: LVEF 55%, mild LVH, grade 1 diastolic impairment, no significant valvular heart disease.  Stress Testing:  Lexiscan (Walking with mod Bruce)Tetrofosmin Stress Test  03/23/2020: Nondiagnostic ECG stress. Resting EKG/ECG demonstrated normal sinus rhythm. Left QRS axis deviation in the presence of right bundle branch block. Non-specific ST-T abnormality. Resting ECG revealed premature ventricular contractions. PVC and V- Bigeminy persisted throughout the lexiscan stress with mod-Bruce protocol. No additional ST-T changes from baseline.  There was diaphragmatic attenuation in inferior wall, otherwise myocardial perfusion is normal. Overall LV systolic function is normal without regional wall motion abnormalities. There is a fixed mild defect in the inferior region.  Stress LV EF: 60%.  No previous exam available for comparison. Low risk.   Heart Catheterization: None  IVC/Iliac Study 10/14/2019: Abdominal Aorta: There is evidence of abnormal dilatation of the distal Abdominal aorta. IVC/Iliac: There is evidence of acute thrombus involving the IVC. There is evidence of acute thrombus involving the right common iliac vein, left common iliac vein, right external iliac vein, left external iliac vein. Visualization of proximal Inferior Vena Cava was limited.  US Venous  duplex 10/14/2019: RIGHT:  - Findings consistent with acute deep vein thrombosis involving the right common femoral vein, SF junction, right femoral vein, right proximal  profunda vein, right popliteal vein, right posterior tibial veins, right peroneal veins, and right gastrocnemius veins.  - No cystic structure found in the popliteal fossa.     LEFT:  - Findings consistent with acute deep vein thrombosis involving the left common femoral vein, SF junction, left femoral vein, left proximal profunda vein, left popliteal vein, and left gastrocnemius veins.  - No cystic structure found in the popliteal fossa.   LABORATORY DATA: CBC Latest Ref Rng & Units 08/06/2020 08/05/2020 08/05/2020  WBC 4.0 - 10.5 K/uL - - -  Hemoglobin 13.0 - 17.0 g/dL 12.7(L) 12.3(L) 13.4  Hematocrit 39.0 - 52.0 % 40.4 40.1 43.1  Platelets 150 - 400 K/uL - - -    CMP Latest Ref Rng & Units 04/29/2021 08/06/2020 08/05/2020  Glucose 65 - 99 mg/dL 185(H) 139(H) 127(H)  BUN 8 - 27 mg/dL 20 15 26(H)  Creatinine 0.76 - 1.27 mg/dL 1.49(H) 1.19 1.48(H)  Sodium 134 - 144 mmol/L 140 140 142  Potassium 3.5 - 5.2 mmol/L 5.2 3.8 3.5  Chloride 96 - 106 mmol/L 105 109 107  CO2 20 - 29 mmol/L '21 23 24  '$ Calcium 8.6 - 10.2 mg/dL 9.6 8.5(L) 9.3  Total Protein 6.0 - 8.5 g/dL 7.2 6.6 7.9  Total Bilirubin 0.0 - 1.2 mg/dL 0.5 1.0 0.8  Alkaline Phos 44 - 121 IU/L 112 73 89  AST 0 - 40 IU/L '24 19 24  '$ ALT 0 - 44 IU/L 36 23 31    Lipid Panel     Component Value Date/Time   CHOL 126 04/29/2021 0853   TRIG 51 04/29/2021 0853   HDL 56 04/29/2021 0853   LDLCALC 58 04/29/2021 0853   LDLDIRECT 58 04/29/2021 0853   LABVLDL 12 04/29/2021 0853    Lab Results  Component Value Date   HGBA1C 8.5 (H) 08/05/2020   HGBA1C 7.5 (H) 09/19/2019   HGBA1C 7.2 (H) 05/20/2014   No components found for: NTPROBNP Lab Results  Component Value Date   TSH 6.373 (H) 10/13/2019   TSH 1.36 05/12/2010   IMPRESSION:    ICD-10-CM   1. Dyspnea on  exertion  R06.00 EKG 12-Lead    2. Essential hypertension  I10 metoprolol succinate (TOPROL-XL) 25 MG 24 hr tablet    Basic metabolic panel    3. Hypercholesterolemia  E78.00     4. RBBB  I45.10     5. Coronary artery calcification seen on CT scan  I25.10     6. Type 2 diabetes mellitus with other ophthalmic complication, with long-term current use of insulin (HCC)  E11.39    Z79.4     7. Long-term insulin use (HCC)  Z79.4     8. Hx pulmonary embolism  Z86.711     9. Chronic deep vein thrombosis (DVT) of proximal vein of both lower extremities (HCC)  I82.5Y3     10. Presence of IVC filter  Z95.828        RECOMMENDATIONS: Matthew Zimmerman is a 75 y.o. male whose past medical history and cardiovascular risk factors include: Hypertension, type 2 diabetes mellitus with complications, hyperlipidemia, former smoker, history of DVT/PE and status post IVC filter on oral anticoagulation, coronary artery calcification by CT scan, advanced age.  Dyspnea on exertion Chronic and stable. Recently has undergone ischemic evaluation as noted above. Overall euvolemic. Medications reconciled. Continue to monitor.  Essential hypertension Blood pressures are in excellent control. Medications reconciled. Refilled metoprolol succinate. Patient is most recent lab work from 8/26 2022 notes elevation in serum creatinine most likely secondary to intravascular depletion.  I have asked him to increase his fluid intake by 2 to 4 glasses a day.  We will repeat blood work in 1 week to evaluate renal function. Low-salt diet recommended  Hypercholesterolemia Currently on atorvastatin. Most recent lipid profile from 04/29/2021 independently reviewed.  LDL is 58 mg/dL. He does not endorse any myalgias.  Coronary artery calcification seen on CT scan Continue statin therapy. Currently not on aspirin given his history of rectal bleeding requiring ED visits in the past.  He is already on oral anticoagulation  given his history of DVT/PE.  Type 2 diabetes mellitus with other ophthalmic complication, with long-term current use of insulin (HCC) Currently on insulin. Currently managed by primary team. Agree with the use of Farxiga given his comorbid conditions.  History of pulmonary  embolism and deep vein thrombosis (DVT) of proximal vein of both lower extremities status post IVC filter (HCC) Patient is currently on oral anticoagulation being managed by his other providers within his care team.  FINAL MEDICATION LIST END OF ENCOUNTER: Meds ordered this encounter  Medications   metoprolol succinate (TOPROL-XL) 25 MG 24 hr tablet    Sig: TAKE 1 TABLET(25 MG) BY MOUTH DAILY WITH OR IMMEDIATELY FOLLOWING A MEAL    Dispense:  90 tablet    Refill:  0    Medications Discontinued During This Encounter  Medication Reason   doxycycline (ADOXA) 100 MG tablet Error   azithromycin (ZITHROMAX) 250 MG tablet Error   clindamycin (CLEOCIN T) 1 % external solution Error   clotrimazole-betamethasone (LOTRISONE) cream Error   augmented betamethasone dipropionate (DIPROLENE-AF) 0.05 % cream Error   furosemide (LASIX) 80 MG tablet Patient Preference   metoprolol succinate (TOPROL-XL) 25 MG 24 hr tablet Reorder     Current Outpatient Medications:    acetaminophen (TYLENOL) 650 MG CR tablet, as needed., Disp: , Rfl:    apixaban (ELIQUIS) 5 MG TABS tablet, Take 1 tablet (5 mg total) by mouth 2 (two) times daily., Disp: , Rfl:    atorvastatin (LIPITOR) 40 MG tablet, TAKE 1 TABLET(40 MG) BY MOUTH AT BEDTIME, Disp: 90 tablet, Rfl: 1   BD INSULIN SYRINGE U/F 31G X 5/16" 0.5 ML MISC, 2 (two) times daily., Disp: , Rfl:    clindamycin (CLEOCIN T) 1 % external solution, Apply topically 2 (two) times daily., Disp: , Rfl:    dapagliflozin propanediol (FARXIGA) 10 MG TABS tablet, Take 5 mg by mouth daily., Disp: , Rfl:    diclofenac sodium (VOLTAREN) 1 % GEL, Apply 1 application topically 4 (four) times daily as needed  (pain)., Disp: , Rfl:    glucose blood test strip, Use to test blood sugar twice a day, Disp: , Rfl:    insulin NPH-regular Human (NOVOLIN 70/30) (70-30) 100 UNIT/ML injection, Inject 30 Units into the skin 2 (two) times daily with a meal. , Disp: , Rfl:    Lancets (ONETOUCH DELICA PLUS 123XX123) MISC, Use to test blood sugar twice daily, Disp: , Rfl:    NON FORMULARY, Take 1 tablet by mouth at bedtime. HeartZest, Disp: , Rfl:    NON FORMULARY, Take 1 tablet by mouth daily. CardioRelax AO, Disp: , Rfl:    vitamin B-12 (CYANOCOBALAMIN) 500 MCG tablet, Take 500 mcg by mouth daily. , Disp: , Rfl:    Vitamin D, Ergocalciferol, (DRISDOL) 1.25 MG (50000 UNIT) CAPS capsule, Take 50,000 Units by mouth every Sunday. , Disp: , Rfl:    metoprolol succinate (TOPROL-XL) 25 MG 24 hr tablet, TAKE 1 TABLET(25 MG) BY MOUTH DAILY WITH OR IMMEDIATELY FOLLOWING A MEAL, Disp: 90 tablet, Rfl: 0  Orders Placed This Encounter  Procedures   Basic metabolic panel   EKG [id number]   --Continue cardiac medications as reconciled in final medication list. --Return in about 6 months (around 11/01/2021) for Follow up, Coronary artery calcification, Lipid. Or sooner if needed. --Continue follow-up with your primary care physician regarding the management of your other chronic comorbid conditions.  Patient's questions and concerns were addressed to his satisfaction. He voices understanding of the instructions provided during this encounter.   This note was created using a voice recognition software as a result there may be grammatical errors inadvertently enclosed that do not reflect the nature of this encounter. Every attempt is made to correct such errors.  Total time spent:  32 minutes  Mechele Claude [facility]  Pager: [phone] Office: [phone]

## 2021-05-05 NOTE — Progress Notes (Signed)
Patient came for a ov

## 2021-06-02 ENCOUNTER — Ambulatory Visit (INDEPENDENT_AMBULATORY_CARE_PROVIDER_SITE_OTHER): Payer: HMO | Admitting: Ophthalmology

## 2021-06-02 ENCOUNTER — Other Ambulatory Visit: Payer: Self-pay

## 2021-06-02 ENCOUNTER — Encounter (INDEPENDENT_AMBULATORY_CARE_PROVIDER_SITE_OTHER): Payer: Self-pay | Admitting: Ophthalmology

## 2021-06-02 DIAGNOSIS — E113412 Type 2 diabetes mellitus with severe nonproliferative diabetic retinopathy with macular edema, left eye: Secondary | ICD-10-CM

## 2021-06-02 DIAGNOSIS — H35372 Puckering of macula, left eye: Secondary | ICD-10-CM

## 2021-06-02 DIAGNOSIS — E113411 Type 2 diabetes mellitus with severe nonproliferative diabetic retinopathy with macular edema, right eye: Secondary | ICD-10-CM | POA: Diagnosis not present

## 2021-06-02 MED ORDER — BEVACIZUMAB 2.5 MG/0.1ML IZ SOSY
2.5000 mg | PREFILLED_SYRINGE | INTRAVITREAL | Status: AC | PRN
  Administered 2021-06-02: 2.5 mg via INTRAVITREAL

## 2021-06-02 NOTE — Assessment & Plan Note (Signed)
Focal laser completed some 4 months previous superior to FAZ.  Some regions temporal to the FAZ today have residual CSME will need antivegF and follow-up focal laser in the near future

## 2021-06-02 NOTE — Assessment & Plan Note (Signed)
OS, the CSME  component of the disease left eye has resolved

## 2021-06-02 NOTE — Progress Notes (Signed)
06/02/2021     CHIEF COMPLAINT Patient presents for  Chief Complaint  Patient presents with   Retina Follow Up    2 week focal OD and OCT Pt states VA OU stable since last visit. Pt denies FOL, floaters, or ocular pain OU.  LBS:110      HISTORY OF PRESENT ILLNESS: Matthew Zimmerman is a 75 y.o. male who presents to the clinic today for:   HPI     Retina Follow Up   Patient presents with  Diabetic Retinopathy.  In both eyes.  This started 4 months ago.  Severity is mild.  Duration of 4 months.  Since onset it is stable. Additional comments: 2 week focal OD and OCT Pt states VA OU stable since last visit. Pt denies FOL, floaters, or ocular pain OU.  LBS:110        Comments   4 mos fu ou oct. Patient states vision is stable and unchanged since last visit. Denies any new floaters or FOL. LBS: 122 this morning A1C: Pt states " I think it was 7.2, about 3 months ago. I have an appointment soon."      Last edited by Laurin Coder on 06/02/2021  9:34 AM.      Referring physician: Jani Gravel, MD [place],  [place] [id number]  HISTORICAL INFORMATION:   Selected notes from the MEDICAL RECORD NUMBER    Lab Results  Component Value Date   HGBA1C 8.5 (H) 08/05/2020     CURRENT MEDICATIONS: No current outpatient medications on file. (Ophthalmic Drugs)   No current facility-administered medications for this visit. (Ophthalmic Drugs)   Current Outpatient Medications (Other)  Medication Sig   acetaminophen (TYLENOL) 650 MG CR tablet as needed.   apixaban (ELIQUIS) 5 MG TABS tablet Take 1 tablet (5 mg total) by mouth 2 (two) times daily.   atorvastatin (LIPITOR) 40 MG tablet TAKE 1 TABLET(40 MG) BY MOUTH AT BEDTIME   BD INSULIN SYRINGE U/F 31G X 5/16" 0.5 ML MISC 2 (two) times daily.   clindamycin (CLEOCIN T) 1 % external solution Apply topically 2 (two) times daily.   dapagliflozin propanediol (FARXIGA) 10 MG TABS tablet Take 5 mg by mouth  daily.   diclofenac sodium (VOLTAREN) 1 % GEL Apply 1 application topically 4 (four) times daily as needed (pain).   glucose blood test strip Use to test blood sugar twice a day   insulin NPH-regular Human (NOVOLIN 70/30) (70-30) 100 UNIT/ML injection Inject 30 Units into the skin 2 (two) times daily with a meal.    Lancets (ONETOUCH DELICA PLUS [id number]) MISC Use to test blood sugar twice daily   metoprolol succinate (TOPROL-XL) 25 MG 24 hr tablet TAKE 1 TABLET(25 MG) BY MOUTH DAILY WITH OR IMMEDIATELY FOLLOWING A MEAL   NON FORMULARY Take 1 tablet by mouth at bedtime. HeartZest   NON FORMULARY Take 1 tablet by mouth daily. CardioRelax AO   vitamin B-12 (CYANOCOBALAMIN) 500 MCG tablet Take 500 mcg by mouth daily.    Vitamin D, Ergocalciferol, (DRISDOL) 1.25 MG (50000 UNIT) CAPS capsule Take 50,000 Units by mouth every Sunday.    No current facility-administered medications for this visit. (Other)      REVIEW OF SYSTEMS:    ALLERGIES Allergies  Allergen Reactions   Cortisone Other (See Comments)    CBG thrown out of control   Clotrimazole-Betamethasone Other (See Comments)    PAST MEDICAL HISTORY Past Medical History:  Diagnosis Date  Carpal tunnel syndrome on both sides    Chest pain    Coronary artery calcification seen on CAT scan    Diabetes mellitus without complication (HCC)    DVT (deep venous thrombosis) (HCC)    GERD (gastroesophageal reflux disease)    Headache    Hypercholesteremia    Hyperlipidemia    Hypertension    PE (pulmonary embolism)    Shortness of breath dyspnea    PE    Past Surgical History:  Procedure Laterality Date   BILATERAL CARPAL TUNNEL RELEASE Bilateral    CATARACT EXTRACTION W/PHACO Left 08/05/2006   Dr. Gershon Crane   CATARACT EXTRACTION W/PHACO Right 08/18/2006   Dr. Gershon Crane   COLONOSCOPY     COLONOSCOPY WITH PROPOFOL Left 09/20/2019   Procedure: COLONOSCOPY WITH PROPOFOL;  Surgeon: Ronnette Juniper, MD;  Location: [facility];  Service:  Gastroenterology;  Laterality: Left;   COLONOSCOPY WITH PROPOFOL N/A 10/03/2019   Procedure: COLONOSCOPY WITH PROPOFOL;  Surgeon: Wilford Corner, MD;  Location: [facility];  Service: Endoscopy;  Laterality: N/A;   ELBOW SURGERY     BONE SPURS REMOVED B ELBOWS   EYE SURGERY Bilateral    cataracts   FOOT SURGERY     HEEL SPURS REMOVED B HEELS   HEMOSTASIS CLIP PLACEMENT  09/20/2019   Procedure: HEMOSTASIS CLIP PLACEMENT;  Surgeon: Ronnette Juniper, MD;  Location: [facility];  Service: Gastroenterology;;   HEMOSTASIS CLIP PLACEMENT  10/03/2019   Procedure: HEMOSTASIS CLIP PLACEMENT;  Surgeon: Wilford Corner, MD;  Location: [facility];  Service: Endoscopy;;   HOT HEMOSTASIS N/A 09/20/2019   Procedure: HOT HEMOSTASIS (ARGON PLASMA COAGULATION/BICAP);  Surgeon: Ronnette Juniper, MD;  Location: [facility];  Service: Gastroenterology;  Laterality: N/A;   I & D EXTREMITY Right 09/18/2014   Procedure: IRRIGATION AND DEBRIDEMENT RIGHT THUMB, open fracture repair;  Surgeon: Linna Hoff, MD;  Location: [facility];  Service: Orthopedics;  Laterality: Right;   IR IVC FILTER PLMT / S&I /IMG GUID/MOD SED  10/03/2019   LEG SURGERY Left    left thigh table saw accident, "cut to the bone", I&D/suturing   SHOULDER SURGERY Left    Bone spur   SPINE SURGERY  09/04/1985   lumbar spine   SUBMUCOSAL INJECTION  10/03/2019   Procedure: SUBMUCOSAL INJECTION;  Surgeon: Wilford Corner, MD;  Location: [facility];  Service: Endoscopy;;   TOE SURGERY      FAMILY HISTORY Family History  Problem Relation Age of Onset   Cancer Mother    Diabetes Father     SOCIAL HISTORY Social History   Tobacco Use   Smoking status: Former    Packs/day: 0.50    Types: Cigarettes    Quit date: 1964    Years since quitting: 58.7   Smokeless tobacco: Former   Tobacco comments:    smoking since 75 years old  Vaping Use   Vaping Use: Never used  Substance Use Topics   Alcohol use: No   Drug use: No         OPHTHALMIC  EXAM:  Base Eye Exam     Visual Acuity (ETDRS)       Right Left   Dist [place] 20/30 -1+2 20/60 -1   Dist ph [place]  20/50 -1+2         Tonometry (Tonopen, 9:38 AM)       Right Left   Pressure 11 13         Pupils       Pupils Dark Light Shape  React APD   Right PERRL 3 3 Round Minimal None   Left PERRL 3 3 Round Minimal None         Visual Fields (Counting fingers)       Left Right    Full Full         Extraocular Movement       Right Left    Full Full         Neuro/Psych     Oriented x3: Yes   Mood/Affect: Normal         Dilation     Both eyes: 1.0% Mydriacyl, 2.5% Phenylephrine @ 9:38 AM           Slit Lamp and Fundus Exam     External Exam       Right Left   External Normal Normal         Slit Lamp Exam       Right Left   Lids/Lashes Normal Normal   Conjunctiva/Sclera White and quiet White and quiet   Cornea Clear Clear   Anterior Chamber Deep and quiet Deep and quiet   Iris Round and reactive Round and reactive   Lens Posterior chamber intraocular lens Posterior chamber intraocular lens   Anterior Vitreous Normal Normal         Fundus Exam       Right Left   Posterior Vitreous Posterior vitreous detachment Posterior vitreous detachment   Disc cilio retinal artery Normal   C/D Ratio 0.35 0.3   Macula Macular thickening superior and temporally , Microaneurysms, Mild clinically significant macular edema, no exudates Microaneurysms, no macular thickening, Focal laser scars, Epiretinal membrane shiny ILM, no topographic distortion left eye   Vessels Severe NPDR Severe NPDR   Periphery Normal Normal            IMAGING AND PROCEDURES  Imaging and Procedures for 06/02/21  OCT, Retina - OU - Both Eyes       Right Eye Quality was good. Scan locations included subfoveal. Central Foveal Thickness: 332. Progression has worsened. Findings include abnormal foveal contour, cystoid macular edema.   Left Eye Quality was good.  Scan locations included subfoveal. Central Foveal Thickness: 214. Progression has improved. Findings include abnormal foveal contour.   Notes Minor increase in CSME superior and temporal to the fovea of the right eye with minor center involvement.  Thus regions previously treated with focal have improved.  Recurrence is occurred on the temporal aspect of the fovea, will need intravitreal antivegF OD today  OS, no active CSME will continue to observe     Intravitreal Injection, Pharmacologic Agent - OD - Right Eye       Time Out 06/02/2021. 10:05 AM. Confirmed correct patient, procedure, site, and patient consented.   Anesthesia Topical anesthesia was used. Anesthetic medications included [place] 3.5%.   Procedure Preparation included Tobramycin 0.3%, 10% betadine to eyelids, 5% betadine to ocular surface. A 30 gauge needle was used.   Injection: 2.5 mg bevacizumab 2.5 MG/0.1ML   Route: Intravitreal, Site: Right Eye   NDC: [phone], Lot: [id number]   Post-op Post injection exam found visual acuity of at least counting fingers. The patient tolerated the procedure well. There were no complications. The patient received written and verbal post procedure care education. Post injection medications were not given.              ASSESSMENT/PLAN:  Severe nonproliferative diabetic retinopathy of right eye, with macular edema, associated with type 2 diabetes mellitus (  [place]) Focal laser completed some 4 months previous superior to FAZ.  Some regions temporal to the [place] today have residual CSME will need antivegF and follow-up focal laser in the near future  Severe nonproliferative diabetic retinopathy of left eye, with macular edema, associated with type 2 diabetes mellitus (HCC) OS, the CSME  component of the disease left eye has resolved  Macular pucker, left eye Clinically detectable no impact on acuity nor macular anatomy     ICD-10-CM   1. Severe nonproliferative diabetic  retinopathy of right eye, with macular edema, associated with type 2 diabetes mellitus (HCC)  E11.3411 OCT, Retina - OU - Both Eyes    Intravitreal Injection, Pharmacologic Agent - OD - Right Eye    bevacizumab (AVASTIN) SOSY 2.5 mg    2. Severe nonproliferative diabetic retinopathy of left eye, with macular edema, associated with type 2 diabetes mellitus (HCC)  Y22.3361 OCT, Retina - OU - Both Eyes    3. Macular pucker, left eye  H35.372       1.  OD, with CSME superior edge of the FAZ which has improved some 4 months post recent focal laser.  Some region temporally with slightly active.  Will need antivegF, intravitreal Avastin OD today in order to halt progression and recurrence of CME affecting center of the [place].  2.  Dilate OD next possible Avastin next and likely focal laser will occur thereafter  3.  Ophthalmic Meds Ordered this visit:  Meds ordered this encounter  Medications   bevacizumab (AVASTIN) SOSY 2.5 mg       Return in about 6 weeks (around 07/14/2021) for dilate, OD, AVASTIN OCT.  There are no Patient Instructions on file for this visit.   Explained the diagnoses, plan, and follow up with the patient and they expressed understanding.  Patient expressed understanding of the importance of proper follow up care.   Clent Demark Mays Paino M.D. Diseases & Surgery of the Retina and Vitreous Retina & Diabetic [place] 06/02/21     Abbreviations: M myopia (nearsighted); A astigmatism; H hyperopia (farsighted); P presbyopia; Mrx spectacle prescription;  CTL contact lenses; OD right eye; OS left eye; OU both eyes  XT exotropia; ET esotropia; PEK punctate epithelial keratitis; PEE punctate epithelial erosions; DES dry eye syndrome; MGD meibomian gland dysfunction; ATs artificial tears; PFAT's preservative free artificial tears; [place] nuclear sclerotic cataract; PSC posterior subcapsular cataract; ERM epi-retinal membrane; PVD posterior vitreous detachment; RD retinal detachment;  DM diabetes mellitus; DR diabetic retinopathy; NPDR non-proliferative diabetic retinopathy; PDR proliferative diabetic retinopathy; CSME clinically significant macular edema; DME diabetic macular edema; dbh dot blot hemorrhages; CWS cotton wool spot; POAG primary open angle glaucoma; C/D cup-to-disc ratio; HVF humphrey visual field; GVF goldmann visual field; OCT optical coherence tomography; IOP intraocular pressure; BRVO Branch retinal vein occlusion; CRVO central retinal vein occlusion; CRAO central retinal artery occlusion; BRAO branch retinal artery occlusion; RT retinal tear; SB scleral buckle; PPV pars plana vitrectomy; VH Vitreous hemorrhage; PRP panretinal laser photocoagulation; IVK intravitreal kenalog; VMT vitreomacular traction; MH Macular hole;  NVD neovascularization of the disc; NVE neovascularization elsewhere; AREDS age related eye disease study; ARMD age related macular degeneration; POAG primary open angle glaucoma; EBMD epithelial/anterior basement membrane dystrophy; ACIOL anterior chamber intraocular lens; IOL intraocular lens; PCIOL posterior chamber intraocular lens; Phaco/IOL phacoemulsification with intraocular lens placement; [place] photorefractive keratectomy; LASIK laser assisted in situ keratomileusis; HTN hypertension; DM diabetes mellitus; COPD chronic obstructive pulmonary disease

## 2021-06-02 NOTE — Assessment & Plan Note (Signed)
Clinically detectable no impact on acuity nor macular anatomy

## 2021-06-03 DIAGNOSIS — E1122 Type 2 diabetes mellitus with diabetic chronic kidney disease: Secondary | ICD-10-CM | POA: Diagnosis not present

## 2021-06-03 DIAGNOSIS — N1831 Chronic kidney disease, stage 3a: Secondary | ICD-10-CM | POA: Diagnosis not present

## 2021-06-03 DIAGNOSIS — I129 Hypertensive chronic kidney disease with stage 1 through stage 4 chronic kidney disease, or unspecified chronic kidney disease: Secondary | ICD-10-CM | POA: Diagnosis not present

## 2021-06-03 DIAGNOSIS — I2510 Atherosclerotic heart disease of native coronary artery without angina pectoris: Secondary | ICD-10-CM | POA: Diagnosis not present

## 2021-06-13 DIAGNOSIS — E1122 Type 2 diabetes mellitus with diabetic chronic kidney disease: Secondary | ICD-10-CM | POA: Diagnosis not present

## 2021-06-13 DIAGNOSIS — E1165 Type 2 diabetes mellitus with hyperglycemia: Secondary | ICD-10-CM | POA: Diagnosis not present

## 2021-06-13 DIAGNOSIS — Z6829 Body mass index (BMI) 29.0-29.9, adult: Secondary | ICD-10-CM | POA: Diagnosis not present

## 2021-06-13 DIAGNOSIS — Z794 Long term (current) use of insulin: Secondary | ICD-10-CM | POA: Diagnosis not present

## 2021-06-13 DIAGNOSIS — I10 Essential (primary) hypertension: Secondary | ICD-10-CM | POA: Diagnosis not present

## 2021-06-13 DIAGNOSIS — E785 Hyperlipidemia, unspecified: Secondary | ICD-10-CM | POA: Diagnosis not present

## 2021-06-13 DIAGNOSIS — I480 Paroxysmal atrial fibrillation: Secondary | ICD-10-CM | POA: Diagnosis not present

## 2021-06-13 DIAGNOSIS — N1831 Chronic kidney disease, stage 3a: Secondary | ICD-10-CM | POA: Diagnosis not present

## 2021-06-13 DIAGNOSIS — I2510 Atherosclerotic heart disease of native coronary artery without angina pectoris: Secondary | ICD-10-CM | POA: Diagnosis not present

## 2021-06-17 DIAGNOSIS — R1031 Right lower quadrant pain: Secondary | ICD-10-CM | POA: Diagnosis not present

## 2021-06-17 DIAGNOSIS — S76211A Strain of adductor muscle, fascia and tendon of right thigh, initial encounter: Secondary | ICD-10-CM | POA: Diagnosis not present

## 2021-06-26 ENCOUNTER — Other Ambulatory Visit: Payer: Self-pay | Admitting: Cardiology

## 2021-06-26 DIAGNOSIS — E7800 Pure hypercholesterolemia, unspecified: Secondary | ICD-10-CM

## 2021-07-06 DIAGNOSIS — E11319 Type 2 diabetes mellitus with unspecified diabetic retinopathy without macular edema: Secondary | ICD-10-CM | POA: Diagnosis not present

## 2021-07-06 DIAGNOSIS — E118 Type 2 diabetes mellitus with unspecified complications: Secondary | ICD-10-CM | POA: Diagnosis not present

## 2021-07-06 DIAGNOSIS — E785 Hyperlipidemia, unspecified: Secondary | ICD-10-CM | POA: Diagnosis not present

## 2021-07-06 DIAGNOSIS — I480 Paroxysmal atrial fibrillation: Secondary | ICD-10-CM | POA: Diagnosis not present

## 2021-07-06 DIAGNOSIS — M542 Cervicalgia: Secondary | ICD-10-CM | POA: Diagnosis not present

## 2021-07-06 DIAGNOSIS — I10 Essential (primary) hypertension: Secondary | ICD-10-CM | POA: Diagnosis not present

## 2021-07-06 DIAGNOSIS — R972 Elevated prostate specific antigen [PSA]: Secondary | ICD-10-CM | POA: Diagnosis not present

## 2021-07-06 DIAGNOSIS — Z0000 Encounter for general adult medical examination without abnormal findings: Secondary | ICD-10-CM | POA: Diagnosis not present

## 2021-07-06 DIAGNOSIS — I2510 Atherosclerotic heart disease of native coronary artery without angina pectoris: Secondary | ICD-10-CM | POA: Diagnosis not present

## 2021-07-06 DIAGNOSIS — Z7901 Long term (current) use of anticoagulants: Secondary | ICD-10-CM | POA: Diagnosis not present

## 2021-07-06 DIAGNOSIS — N1831 Chronic kidney disease, stage 3a: Secondary | ICD-10-CM | POA: Diagnosis not present

## 2021-07-08 ENCOUNTER — Emergency Department (HOSPITAL_COMMUNITY)
Admission: EM | Admit: 2021-07-08 | Discharge: 2021-07-09 | Disposition: A | Discharge status: Home / Self Care | Payer: HMO | Attending: Emergency Medicine | Admitting: Emergency Medicine

## 2021-07-08 ENCOUNTER — Emergency Department (HOSPITAL_COMMUNITY): Payer: HMO

## 2021-07-08 ENCOUNTER — Encounter (HOSPITAL_COMMUNITY): Payer: Self-pay

## 2021-07-08 DIAGNOSIS — E1122 Type 2 diabetes mellitus with diabetic chronic kidney disease: Secondary | ICD-10-CM | POA: Diagnosis not present

## 2021-07-08 DIAGNOSIS — R220 Localized swelling, mass and lump, head: Secondary | ICD-10-CM | POA: Diagnosis not present

## 2021-07-08 DIAGNOSIS — N189 Chronic kidney disease, unspecified: Secondary | ICD-10-CM | POA: Insufficient documentation

## 2021-07-08 DIAGNOSIS — E113413 Type 2 diabetes mellitus with severe nonproliferative diabetic retinopathy with macular edema, bilateral: Secondary | ICD-10-CM | POA: Diagnosis not present

## 2021-07-08 DIAGNOSIS — M542 Cervicalgia: Secondary | ICD-10-CM | POA: Diagnosis not present

## 2021-07-08 DIAGNOSIS — Z79899 Other long term (current) drug therapy: Secondary | ICD-10-CM | POA: Diagnosis not present

## 2021-07-08 DIAGNOSIS — Z7901 Long term (current) use of anticoagulants: Secondary | ICD-10-CM | POA: Insufficient documentation

## 2021-07-08 DIAGNOSIS — Z794 Long term (current) use of insulin: Secondary | ICD-10-CM | POA: Diagnosis not present

## 2021-07-08 DIAGNOSIS — Z87891 Personal history of nicotine dependence: Secondary | ICD-10-CM | POA: Diagnosis not present

## 2021-07-08 DIAGNOSIS — M436 Torticollis: Secondary | ICD-10-CM | POA: Insufficient documentation

## 2021-07-08 DIAGNOSIS — I129 Hypertensive chronic kidney disease with stage 1 through stage 4 chronic kidney disease, or unspecified chronic kidney disease: Secondary | ICD-10-CM | POA: Diagnosis not present

## 2021-07-08 NOTE — ED Triage Notes (Signed)
Pt comes via [facility] for neck pain that has been going on for years, worse over the past couple days, muscle spasms

## 2021-07-08 NOTE — ED Provider Notes (Signed)
Emergency Medicine Provider Triage Evaluation Note  Matthew Zimmerman , a 75 y.o. male  was evaluated in triage.  Pt complains of neck pain since Sunday.  Pain is constant, worse when he moves.  In the middle of his neck and towards the right side going into his right shoulder.  No fall, trauma, or injury.  Similar symptoms several years ago without diagnosis made.  No numbness or tingling.  No fevers or chills. No HA  Review of Systems  Positive: Neck and shoulder pain Negative: fever  Physical Exam  BP 131/84   Pulse 77   Temp 98.8 F (37.1 C)   Resp 16   SpO2 100%  Gen:   Awake, no distress   Resp:  Normal effort  MSK:   TTP over midline C-spine as well as the right-sided paracervical muscles.  Tenderness palpation where the neck meets the shoulder.  Grip strength equal bilaterally.  Radial pulses 2+ bilaterally.  Limited range of motion of the head due to pain.  Medical Decision Making  Medically screening exam initiated at 8:08 PM.  Appropriate orders placed.  Deroy Noah was informed that the remainder of the evaluation will be completed by another provider, this initial triage assessment does not replace that evaluation, and the importance of remaining in the ED until their evaluation is complete.  ct   Franchot Heidelberg, PA-C 07/08/21 2009    Jeanell Sparrow, DO 07/08/21 [id number]

## 2021-07-09 ENCOUNTER — Other Ambulatory Visit: Payer: Self-pay

## 2021-07-09 MED ORDER — TIZANIDINE HCL 2 MG PO TABS: 2.0000 mg | ORAL_TABLET | Freq: Three times a day (TID) | ORAL | 0 refills | Status: AC | PRN

## 2021-07-09 MED ORDER — TIZANIDINE HCL 4 MG PO TABS
2.0000 mg | ORAL_TABLET | Freq: Once | ORAL | Status: AC
  Administered 2021-07-09: 2 mg via ORAL
  Filled 2021-07-09: qty 1

## 2021-07-09 MED ORDER — DICLOFENAC SODIUM 1 % EX GEL
2.0000 g | Freq: Once | CUTANEOUS | Status: AC
  Administered 2021-07-09: 2 g via TOPICAL
  Filled 2021-07-09: qty 100

## 2021-07-09 MED ORDER — DICLOFENAC SODIUM 1 % EX GEL: 2.0000 g | Freq: Four times a day (QID) | CUTANEOUS | 1 refills | Status: AC

## 2021-07-09 NOTE — ED Provider Notes (Signed)
[facility] DEPARTMENT Provider Note   CSN: [id number] Arrival date & time: 07/08/21  2000     History Chief Complaint  Patient presents with   Neck Pain    Matthew Zimmerman is a 75 y.o. male.  The history is provided by the patient, a relative and medical records.  Neck Pain Matthew Zimmerman is a 75 y.o. male who presents to the Emergency Department complaining of neck pain. He presents the emergency department accompanied by his son for evaluation of right-sided neck pain. Symptoms started about one week ago. He has pain to the right posterior lateral neck. Pain is worse with movement. He does have occasional severe shocks of pain that last for a few minutes at a time. The severe episodes have no clear alleviating or worsening factors. No associated fevers, chest pain, difficulty breathing, nausea, vomiting. He is left-hand dominant. He is currently retired. He did have some numbness to the fingers on his right hand. He does have a history of PE, CKD, diabetes.    Past Medical History:  Diagnosis Date   Carpal tunnel syndrome on both sides    Chest pain    Coronary artery calcification seen on CAT scan    Diabetes mellitus without complication (HCC)    DVT (deep venous thrombosis) (HCC)    GERD (gastroesophageal reflux disease)    Headache    Hypercholesteremia    Hyperlipidemia    Hypertension    PE (pulmonary embolism)    Shortness of breath dyspnea    PE     Patient Active Problem List   Diagnosis Date Noted   Macular pucker, left eye 01/12/2021   Dry eyes, bilateral 11/15/2020   GIB (gastrointestinal bleeding) 08/05/2020   Severe nonproliferative diabetic retinopathy of left eye, with macular edema, associated with type 2 diabetes mellitus ([place]) 12/23/2019   Severe nonproliferative diabetic retinopathy of right eye, with macular edema, associated with type 2 diabetes mellitus ([place]) 12/23/2019   Pseudophakia 12/23/2019   Generalized  weakness    Acute renal failure (HCC)    SOB (shortness of breath)    Iron deficiency anemia    Insulin dependent type 2 diabetes mellitus ([place])    History of pulmonary embolus (PE)    Acute deep vein thrombosis (DVT) of both lower extremities ([place])    Severe sepsis ([place]) 10/13/2019   AKI (acute kidney injury) ([place]) 22/29/7989   Metabolic acidosis 21/19/4174   Acute metabolic encephalopathy 04/17/4817   Emesis 10/13/2019   GI (gastrointestinal bleed) 09/30/2019   Syncope 09/30/2019   Anemia due to acute blood loss 09/18/2019   Rectal bleed 09/18/2019   Pulmonary embolism ([place])    Plantar fasciitis of right foot 05/21/2018   Neck pain 05/20/2014   Intractable back pain 05/20/2014   Warfarin-induced coagulopathy ([place]) 05/20/2014   Diabetes ([place]) 05/12/2010   Essential hypertension 05/12/2010   GERD 05/12/2010   COLONIC POLYPS, HX OF 05/12/2010    Past Surgical History:  Procedure Laterality Date   BILATERAL CARPAL TUNNEL RELEASE Bilateral    CATARACT EXTRACTION W/PHACO Left 08/05/2006   Dr. Gershon Crane   CATARACT EXTRACTION W/PHACO Right 08/18/2006   Dr. Gershon Crane   COLONOSCOPY     COLONOSCOPY WITH PROPOFOL Left 09/20/2019   Procedure: COLONOSCOPY WITH PROPOFOL;  Surgeon: Ronnette Juniper, MD;  Location: [facility];  Service: Gastroenterology;  Laterality: Left;   COLONOSCOPY WITH PROPOFOL N/A 10/03/2019   Procedure: COLONOSCOPY WITH PROPOFOL;  Surgeon: Wilford Corner, MD;  Location: [facility];  Service:  Endoscopy;  Laterality: N/A;   ELBOW SURGERY     BONE SPURS REMOVED B ELBOWS   EYE SURGERY Bilateral    cataracts   FOOT SURGERY     HEEL SPURS REMOVED B HEELS   HEMOSTASIS CLIP PLACEMENT  09/20/2019   Procedure: HEMOSTASIS CLIP PLACEMENT;  Surgeon: Ronnette Juniper, MD;  Location: [facility];  Service: Gastroenterology;;   HEMOSTASIS CLIP PLACEMENT  10/03/2019   Procedure: HEMOSTASIS CLIP PLACEMENT;  Surgeon: Wilford Corner, MD;  Location: [facility];  Service: Endoscopy;;    HOT HEMOSTASIS N/A 09/20/2019   Procedure: HOT HEMOSTASIS (ARGON PLASMA COAGULATION/BICAP);  Surgeon: Ronnette Juniper, MD;  Location: [facility];  Service: Gastroenterology;  Laterality: N/A;   I & D EXTREMITY Right 09/18/2014   Procedure: IRRIGATION AND DEBRIDEMENT RIGHT THUMB, open fracture repair;  Surgeon: Linna Hoff, MD;  Location: [facility];  Service: Orthopedics;  Laterality: Right;   IR IVC FILTER PLMT / S&I /IMG GUID/MOD SED  10/03/2019   LEG SURGERY Left    left thigh table saw accident, "cut to the bone", I&D/suturing   SHOULDER SURGERY Left    Bone spur   SPINE SURGERY  09/04/1985   lumbar spine   SUBMUCOSAL INJECTION  10/03/2019   Procedure: SUBMUCOSAL INJECTION;  Surgeon: Wilford Corner, MD;  Location: [facility];  Service: Endoscopy;;   TOE SURGERY         Family History  Problem Relation Age of Onset   Cancer Mother    Diabetes Father     Social History   Tobacco Use   Smoking status: Former    Packs/day: 0.50    Types: Cigarettes    Quit date: 1964    Years since quitting: 58.8   Smokeless tobacco: Former   Tobacco comments:    smoking since 75 years old  Vaping Use   Vaping Use: Never used  Substance Use Topics   Alcohol use: No   Drug use: No    Home Medications Prior to Admission medications   Medication Sig Start Date End Date Taking? Authorizing Provider  diclofenac Sodium (VOLTAREN) 1 % GEL Apply 2 g topically 4 (four) times daily. 07/09/21  Yes Quintella Reichert, MD  tiZANidine (ZANAFLEX) 2 MG tablet Take 1 tablet (2 mg total) by mouth every 8 (eight) hours as needed for muscle spasms. 07/09/21  Yes Quintella Reichert, MD  acetaminophen (TYLENOL) 650 MG CR tablet as needed.    [provider]  apixaban (ELIQUIS) 5 MG TABS tablet Take 1 tablet (5 mg total) by mouth 2 (two) times daily. 08/07/20   Aline August, MD  atorvastatin (LIPITOR) 40 MG tablet TAKE 1 TABLET(40 MG) BY MOUTH AT BEDTIME 06/27/21   Tolia, Sunit, DO  BD INSULIN SYRINGE U/F  31G X 5/16" 0.5 ML MISC 2 (two) times daily. 07/30/19   [provider]  clindamycin (CLEOCIN T) 1 % external solution Apply topically 2 (two) times daily.    [provider]  dapagliflozin propanediol (FARXIGA) 10 MG TABS tablet Take 5 mg by mouth daily.    Jani Gravel, MD  glucose blood test strip Use to test blood sugar twice a day 12/12/18   [provider]  insulin NPH-regular Human (NOVOLIN 70/30) (70-30) 100 UNIT/ML injection Inject 30 Units into the skin 2 (two) times daily with a meal.     [provider]  Lancets (ONETOUCH DELICA PLUS [id number]) [place] Use to test blood sugar twice daily 12/12/18   [provider]  metoprolol succinate (TOPROL-XL)  25 MG 24 hr tablet TAKE 1 TABLET(25 MG) BY MOUTH DAILY WITH OR IMMEDIATELY FOLLOWING A MEAL 05/04/21   Tolia, Sunit, DO  NON FORMULARY Take 1 tablet by mouth at bedtime. HeartZest    [provider]  NON FORMULARY Take 1 tablet by mouth daily. CardioRelax AO    [provider]  vitamin B-12 (CYANOCOBALAMIN) 500 MCG tablet Take 500 mcg by mouth daily.     [provider]  Vitamin D, Ergocalciferol, (DRISDOL) 1.25 MG (50000 UNIT) CAPS capsule Take 50,000 Units by mouth every Sunday.     [provider]    Allergies    Cortisone and Clotrimazole-betamethasone  Review of Systems   Review of Systems  Musculoskeletal:  Positive for neck pain.  All other systems reviewed and are negative.  Physical Exam Updated Vital Signs BP 122/78   Pulse 64   Temp 97.7 F (36.5 C) (Oral)   Resp 16   Ht 5\' 8"  (1.727 m)   Wt 85.7 kg   SpO2 100%   BMI 28.74 kg/m   Physical Exam Vitals and nursing note reviewed.  Constitutional:      Appearance: He is well-developed.  HENT:     Head: Normocephalic and atraumatic.  Cardiovascular:     Rate and Rhythm: Normal rate and regular rhythm.     Heart sounds: No murmur heard. Pulmonary:     Effort: Pulmonary effort is normal. No  respiratory distress.     Breath sounds: Normal breath sounds.  Abdominal:     Palpations: Abdomen is soft.     Tenderness: There is no abdominal tenderness. There is no guarding or rebound.  Musculoskeletal:     Comments: 2+ radial and DP pulses bilaterally. There is tenderness to palpation over the right trapezius.  Skin:    General: Skin is warm and dry.  Neurological:     Mental Status: He is alert and oriented to person, place, and time.     Comments: Five out of five strength in all four extremities with sensation to light touch intact in all four extremities  Psychiatric:        Behavior: Behavior normal.    ED Results / Procedures / Treatments   Labs (all labs ordered are listed, but only abnormal results are displayed) Labs Reviewed - No data to display  EKG None  Radiology CT Cervical Spine Wo Contrast  Result Date: 07/08/2021 CLINICAL DATA:  Neck pain. EXAM: CT CERVICAL SPINE WITHOUT CONTRAST TECHNIQUE: Multidetector CT imaging of the cervical spine was performed without intravenous contrast. Multiplanar CT image reconstructions were also generated. COMPARISON:  None. FINDINGS: Alignment: Normal. Skull base and vertebrae: No acute fracture. No primary bone lesion or focal pathologic process. Soft tissues and spinal canal: No prevertebral fluid or swelling. No visible canal hematoma. Disc levels: There is disc space narrowing throughout the cervical spine. Endplate osteophytes are seen at C4-C5, C5-C6 and C6-C7 compatible with degenerative change. There also degenerative changes between the anterior arch of C1 and the dens. C2-C3: Mild central canal stenosis secondary to calcified disc bulge. C3-C4: Moderate central canal stenosis secondary to calcified disc bulge. Moderate severe left neural foraminal stenosis secondary to uncovertebral spurring. C4-C5: Mild central canal stenosis secondary to calcified disc bulge. Mild left neural foraminal stenosis secondary to uncovertebral  spurring. C5-C6: Severe right neural foraminal stenosis secondary to uncovertebral spurring. Mild central canal stenosis secondary to posterior disc osteophyte complex. C6-C7: Posterior disc osteophyte complex causes moderate central canal stenosis. There is  moderate right neural foraminal stenosis secondary to uncovertebral spurring. Upper chest: Negative. Other: None. IMPRESSION: 1. No acute fracture or traumatic subluxation of the cervical spine. 2. Moderate multilevel degenerative changes of the cervical spine as above. Electronically Signed   By: Ronney Asters M.D.   On: 07/08/2021 21:34    Procedures Procedures   Medications Ordered in ED Medications  diclofenac Sodium (VOLTAREN) 1 % topical gel 2 g (2 g Topical Given 07/09/21 0706)  tiZANidine (ZANAFLEX) tablet 2 mg (2 mg Oral Given 07/09/21 3817)    ED Course  I have reviewed the triage vital signs and the nursing notes.  Pertinent labs & imaging results that were available during my care of the patient were reviewed by me and considered in my medical decision making (see chart for details).    MDM Rules/Calculators/A&P                          patient with history of PE, CKD on anticoagulation here for evaluation of one week of right-sided atraumatic neck pain. He is neurologically and vascular early intact on examination. Pain is reproducible on examination. Presentation is not consistent with dissection, epidural abscess. After treatment in the department he is feeling improved. Plan to discharge home with home care for torticollis with outpatient follow-up and return precautions  Final Clinical Impression(s) / ED Diagnoses Final diagnoses:  Torticollis, acute    Rx / DC Orders ED Discharge Orders          Ordered    diclofenac Sodium (VOLTAREN) 1 % GEL  4 times daily        07/09/21 0734    tiZANidine (ZANAFLEX) 2 MG tablet  Every 8 hours PRN        07/09/21 0734             Quintella Reichert, MD 07/09/21 [phone]

## 2021-07-14 ENCOUNTER — Encounter (INDEPENDENT_AMBULATORY_CARE_PROVIDER_SITE_OTHER): Payer: HMO | Admitting: Ophthalmology

## 2021-07-20 ENCOUNTER — Ambulatory Visit (INDEPENDENT_AMBULATORY_CARE_PROVIDER_SITE_OTHER): Payer: HMO | Admitting: Ophthalmology

## 2021-07-20 ENCOUNTER — Encounter (INDEPENDENT_AMBULATORY_CARE_PROVIDER_SITE_OTHER): Payer: Self-pay | Admitting: Ophthalmology

## 2021-07-20 ENCOUNTER — Other Ambulatory Visit: Payer: Self-pay

## 2021-07-20 DIAGNOSIS — E113412 Type 2 diabetes mellitus with severe nonproliferative diabetic retinopathy with macular edema, left eye: Secondary | ICD-10-CM | POA: Diagnosis not present

## 2021-07-20 DIAGNOSIS — E113411 Type 2 diabetes mellitus with severe nonproliferative diabetic retinopathy with macular edema, right eye: Secondary | ICD-10-CM

## 2021-07-20 MED ORDER — BEVACIZUMAB 2.5 MG/0.1ML IZ SOSY
2.5000 mg | PREFILLED_SYRINGE | INTRAVITREAL | Status: AC | PRN
Start: 2021-07-20 — End: 2021-07-20
  Administered 2021-07-20: 2.5 mg via INTRAVITREAL

## 2021-07-20 NOTE — Assessment & Plan Note (Signed)
OD chronic active center involved CSME stable with good acuity.  We will repeat injection today at current interval of 7 weeks and maintain 7-week follow-up

## 2021-07-20 NOTE — Progress Notes (Signed)
07/20/2021     CHIEF COMPLAINT Patient presents for  Chief Complaint  Patient presents with   Retina Follow Up      HISTORY OF PRESENT ILLNESS: Matthew Zimmerman is a 75 y.o. male who presents to the clinic today for:   HPI     Retina Follow Up   Patient presents with  Diabetic Retinopathy.  In right eye.  This started 6 weeks ago.  Duration of 6 weeks.  Since onset it is stable.        Comments   6 week f/u OD with OCT and possible Avastin injection.  Pt's wife has recently had another stroke, this was the reason he had to reschedule, due to tending with her needs.      Last edited by Reather Littler, COA on 07/20/2021 10:31 AM.      Referring physician: Jani Gravel, MD [address],  [place] [address]  HISTORICAL INFORMATION:   Selected notes from the MEDICAL RECORD NUMBER    Lab Results  Component Value Date   HGBA1C 8.5 (H) 08/05/2020     CURRENT MEDICATIONS: No current outpatient medications on file. (Ophthalmic Drugs)   No current facility-administered medications for this visit. (Ophthalmic Drugs)   Current Outpatient Medications (Other)  Medication Sig   acetaminophen (TYLENOL) 650 MG CR tablet as needed.   apixaban (ELIQUIS) 5 MG TABS tablet Take 1 tablet (5 mg total) by mouth 2 (two) times daily.   atorvastatin (LIPITOR) 40 MG tablet TAKE 1 TABLET(40 MG) BY MOUTH AT BEDTIME   BD INSULIN SYRINGE U/F 31G X 5/16" 0.5 ML MISC 2 (two) times daily.   clindamycin (CLEOCIN T) 1 % external solution Apply topically 2 (two) times daily.   dapagliflozin propanediol (FARXIGA) 10 MG TABS tablet Take 5 mg by mouth daily.   diclofenac Sodium (VOLTAREN) 1 % GEL Apply 2 g topically 4 (four) times daily.   glucose blood test strip Use to test blood sugar twice a day   insulin NPH-regular Human (NOVOLIN 70/30) (70-30) 100 UNIT/ML injection Inject 30 Units into the skin 2 (two) times daily with a meal.    Lancets (ONETOUCH DELICA PLUS  [id number]) MISC Use to test blood sugar twice daily   metoprolol succinate (TOPROL-XL) 25 MG 24 hr tablet TAKE 1 TABLET(25 MG) BY MOUTH DAILY WITH OR IMMEDIATELY FOLLOWING A MEAL   NON FORMULARY Take 1 tablet by mouth at bedtime. HeartZest   NON FORMULARY Take 1 tablet by mouth daily. CardioRelax AO   tiZANidine (ZANAFLEX) 2 MG tablet Take 1 tablet (2 mg total) by mouth every 8 (eight) hours as needed for muscle spasms.   vitamin B-12 (CYANOCOBALAMIN) 500 MCG tablet Take 500 mcg by mouth daily.    Vitamin D, Ergocalciferol, (DRISDOL) 1.25 MG (50000 UNIT) CAPS capsule Take 50,000 Units by mouth every Sunday.    No current facility-administered medications for this visit. (Other)      REVIEW OF SYSTEMS:    ALLERGIES Allergies  Allergen Reactions   Cortisone Other (See Comments)    CBG thrown out of control   Clotrimazole-Betamethasone Other (See Comments)    PAST MEDICAL HISTORY Past Medical History:  Diagnosis Date   Arthritis    Carpal tunnel syndrome on both sides    Chest pain    Coronary artery calcification seen on CAT scan    Diabetes mellitus without complication (HCC)    DVT (deep venous thrombosis) (HCC)    GERD (gastroesophageal reflux disease)  Headache    Hypercholesteremia    Hyperlipidemia    Hypertension    Neck muscle spasm    PE (pulmonary embolism)    Shortness of breath dyspnea    PE    Past Surgical History:  Procedure Laterality Date   BILATERAL CARPAL TUNNEL RELEASE Bilateral    CATARACT EXTRACTION W/PHACO Left 08/05/2006   Dr. Gershon Crane   CATARACT EXTRACTION W/PHACO Right 08/18/2006   Dr. Gershon Crane   COLONOSCOPY     COLONOSCOPY WITH PROPOFOL Left 09/20/2019   Procedure: COLONOSCOPY WITH PROPOFOL;  Surgeon: Ronnette Juniper, MD;  Location: [facility];  Service: Gastroenterology;  Laterality: Left;   COLONOSCOPY WITH PROPOFOL N/A 10/03/2019   Procedure: COLONOSCOPY WITH PROPOFOL;  Surgeon: Wilford Corner, MD;  Location: [facility];  Service:  Endoscopy;  Laterality: N/A;   ELBOW SURGERY     BONE SPURS REMOVED B ELBOWS   EYE SURGERY Bilateral    cataracts   FOOT SURGERY     HEEL SPURS REMOVED B HEELS   HEMOSTASIS CLIP PLACEMENT  09/20/2019   Procedure: HEMOSTASIS CLIP PLACEMENT;  Surgeon: Ronnette Juniper, MD;  Location: [facility];  Service: Gastroenterology;;   HEMOSTASIS CLIP PLACEMENT  10/03/2019   Procedure: HEMOSTASIS CLIP PLACEMENT;  Surgeon: Wilford Corner, MD;  Location: [facility];  Service: Endoscopy;;   HOT HEMOSTASIS N/A 09/20/2019   Procedure: HOT HEMOSTASIS (ARGON PLASMA COAGULATION/BICAP);  Surgeon: Ronnette Juniper, MD;  Location: [facility];  Service: Gastroenterology;  Laterality: N/A;   I & D EXTREMITY Right 09/18/2014   Procedure: IRRIGATION AND DEBRIDEMENT RIGHT THUMB, open fracture repair;  Surgeon: Linna Hoff, MD;  Location: [facility];  Service: Orthopedics;  Laterality: Right;   IR IVC FILTER PLMT / S&I /IMG GUID/MOD SED  10/03/2019   LEG SURGERY Left    left thigh table saw accident, "cut to the bone", I&D/suturing   SHOULDER SURGERY Left    Bone spur   SPINE SURGERY  09/04/1985   lumbar spine   SUBMUCOSAL INJECTION  10/03/2019   Procedure: SUBMUCOSAL INJECTION;  Surgeon: Wilford Corner, MD;  Location: [facility];  Service: Endoscopy;;   TOE SURGERY      FAMILY HISTORY Family History  Problem Relation Age of Onset   Cancer Mother    Diabetes Father     SOCIAL HISTORY Social History   Tobacco Use   Smoking status: Former    Packs/day: 0.50    Types: Cigarettes    Quit date: 1964    Years since quitting: 58.9   Smokeless tobacco: Former   Tobacco comments:    smoking since 75 years old  Vaping Use   Vaping Use: Never used  Substance Use Topics   Alcohol use: No   Drug use: No         OPHTHALMIC EXAM:  Base Eye Exam     Visual Acuity (ETDRS)       Right Left   Dist [place] 20/32 -2 20/63 -1   Dist ph [place] NI 20/40 -1         Tonometry (Tonopen, 10:39 AM)       Right Left    Pressure 17 14         Pupils       Pupils Dark Light Shape React APD   Right PERRL 3 3 Round Minimal None   Left PERRL 3 3 Round Minimal None         Visual Fields (Counting fingers)       Left Right  Full Full         Extraocular Movement       Right Left    Full, Ortho Full, Ortho         Neuro/Psych     Oriented x3: Yes   Mood/Affect: Normal         Dilation     Both eyes: 1.0% Mydriacyl, 2.5% Phenylephrine @ 10:39 AM           Slit Lamp and Fundus Exam     External Exam       Right Left   External Normal Normal         Slit Lamp Exam       Right Left   Lids/Lashes Normal Normal   Conjunctiva/Sclera White and quiet White and quiet   Cornea Clear Clear   Anterior Chamber Deep and quiet Deep and quiet   Iris Round and reactive Round and reactive   Lens Posterior chamber intraocular lens Posterior chamber intraocular lens   Anterior Vitreous Normal Normal         Fundus Exam       Right Left   Posterior Vitreous Posterior vitreous detachment Posterior vitreous detachment   Disc cilio retinal artery Normal   C/D Ratio 0.35 0.3   Macula Macular thickening superior and temporally , Microaneurysms, Mild clinically significant macular edema, no exudates Microaneurysms, no macular thickening, Focal laser scars, Epiretinal membrane shiny ILM, no topographic distortion left eye   Vessels Severe NPDR Severe NPDR   Periphery Normal Normal            IMAGING AND PROCEDURES  Imaging and Procedures for 07/20/21  OCT, Retina - OU - Both Eyes       Right Eye Quality was good. Scan locations included subfoveal. Central Foveal Thickness: 335. Progression has worsened. Findings include abnormal foveal contour, cystoid macular edema.   Left Eye Quality was good. Scan locations included subfoveal. Central Foveal Thickness: 212. Progression has improved. Findings include abnormal foveal contour.   Notes Minor increase in CSME superior  and temporal to the fovea of the right eye with minor center involvement.  Thus regions previously treated with focal have improved.  Recurrence is occurred on the temporal aspect of the fovea, will need intravitreal antivegF OD today  OS, no active CSME will continue to observe     Intravitreal Injection, Pharmacologic Agent - OD - Right Eye       Time Out 07/20/2021. 11:35 AM. Confirmed correct patient, procedure, site, and patient consented.   Anesthesia Topical anesthesia was used. Anesthetic medications included Lidocaine 4%.   Procedure Preparation included Tobramycin 0.3%, 10% betadine to eyelids, 5% betadine to ocular surface. A 30 gauge needle was used.   Injection: 2.5 mg bevacizumab 2.5 MG/0.1ML   Route: Intravitreal, Site: Right Eye   NDC: [phone], Lot: [id number]   Post-op Post injection exam found visual acuity of at least counting fingers. The patient tolerated the procedure well. There were no complications. The patient received written and verbal post procedure care education. Post injection medications included ocuflox.              ASSESSMENT/PLAN:  Severe nonproliferative diabetic retinopathy of left eye, with macular edema, associated with type 2 diabetes mellitus (HCC) OS stable overall, no active maculopathy  Severe nonproliferative diabetic retinopathy of right eye, with macular edema, associated with type 2 diabetes mellitus (HCC) OD chronic active center involved CSME stable with good acuity.  We will repeat injection today at  current interval of 7 weeks and maintain 7-week follow-up     ICD-10-CM   1. Severe nonproliferative diabetic retinopathy of right eye, with macular edema, associated with type 2 diabetes mellitus (HCC)  E11.3411 OCT, Retina - OU - Both Eyes    Intravitreal Injection, Pharmacologic Agent - OD - Right Eye    bevacizumab (AVASTIN) SOSY 2.5 mg    2. Severe nonproliferative diabetic retinopathy of left eye, with macular  edema, associated with type 2 diabetes mellitus ([place])  Q73.4193       1.  Right eye, with center involved CSME overall stabilized with preservation of good acuity at 7-week interval post Avastin.  Will need repeat injection today  2.  OS no active maculopathy  3.  May need peripheral PRP OD  Ophthalmic Meds Ordered this visit:  Meds ordered this encounter  Medications   bevacizumab (AVASTIN) SOSY 2.5 mg       Return in about 7 weeks (around 09/07/2021) for RV 6 to 7 weeks, DILATE OU, AVASTIN OCT, OD.  There are no Patient Instructions on file for this visit.   Explained the diagnoses, plan, and follow up with the patient and they expressed understanding.  Patient expressed understanding of the importance of proper follow up care.   Clent Demark Marcellus Pulliam M.D. Diseases & Surgery of the Retina and Vitreous Retina [facility] Diabetic [place] 07/20/21     Abbreviations: M myopia (nearsighted); A astigmatism; H hyperopia (farsighted); P presbyopia; Mrx spectacle prescription;  CTL contact lenses; OD right eye; OS left eye; OU both eyes  XT exotropia; ET esotropia; PEK punctate epithelial keratitis; PEE punctate epithelial erosions; DES dry eye syndrome; MGD meibomian gland dysfunction; ATs artificial tears; PFAT's preservative free artificial tears; [place] nuclear sclerotic cataract; PSC posterior subcapsular cataract; ERM epi-retinal membrane; PVD posterior vitreous detachment; RD retinal detachment; DM diabetes mellitus; DR diabetic retinopathy; NPDR non-proliferative diabetic retinopathy; PDR proliferative diabetic retinopathy; CSME clinically significant macular edema; DME diabetic macular edema; dbh dot blot hemorrhages; CWS cotton wool spot; POAG primary open angle glaucoma; C/D cup-to-disc ratio; HVF humphrey visual field; GVF goldmann visual field; OCT optical coherence tomography; IOP intraocular pressure; BRVO Branch retinal vein occlusion; CRVO central retinal vein occlusion; CRAO central  retinal artery occlusion; BRAO branch retinal artery occlusion; RT retinal tear; SB scleral buckle; PPV pars plana vitrectomy; VH Vitreous hemorrhage; PRP panretinal laser photocoagulation; IVK intravitreal kenalog; VMT vitreomacular traction; MH Macular hole;  NVD neovascularization of the disc; NVE neovascularization elsewhere; AREDS age related eye disease study; ARMD age related macular degeneration; POAG primary open angle glaucoma; EBMD epithelial/anterior basement membrane dystrophy; ACIOL anterior chamber intraocular lens; IOL intraocular lens; PCIOL posterior chamber intraocular lens; Phaco/IOL phacoemulsification with intraocular lens placement; [place] photorefractive keratectomy; LASIK laser assisted in situ keratomileusis; HTN hypertension; DM diabetes mellitus; COPD chronic obstructive pulmonary disease

## 2021-07-20 NOTE — Assessment & Plan Note (Signed)
OS stable overall, no active maculopathy

## 2021-07-25 DIAGNOSIS — I480 Paroxysmal atrial fibrillation: Secondary | ICD-10-CM | POA: Diagnosis not present

## 2021-07-25 DIAGNOSIS — E1165 Type 2 diabetes mellitus with hyperglycemia: Secondary | ICD-10-CM | POA: Diagnosis not present

## 2021-07-25 DIAGNOSIS — I10 Essential (primary) hypertension: Secondary | ICD-10-CM | POA: Diagnosis not present

## 2021-07-25 DIAGNOSIS — Z794 Long term (current) use of insulin: Secondary | ICD-10-CM | POA: Diagnosis not present

## 2021-07-25 DIAGNOSIS — E1122 Type 2 diabetes mellitus with diabetic chronic kidney disease: Secondary | ICD-10-CM | POA: Diagnosis not present

## 2021-07-25 DIAGNOSIS — N1831 Chronic kidney disease, stage 3a: Secondary | ICD-10-CM | POA: Diagnosis not present

## 2021-07-25 DIAGNOSIS — I2510 Atherosclerotic heart disease of native coronary artery without angina pectoris: Secondary | ICD-10-CM | POA: Diagnosis not present

## 2021-07-25 DIAGNOSIS — Z6829 Body mass index (BMI) 29.0-29.9, adult: Secondary | ICD-10-CM | POA: Diagnosis not present

## 2021-07-25 DIAGNOSIS — E118 Type 2 diabetes mellitus with unspecified complications: Secondary | ICD-10-CM | POA: Diagnosis not present

## 2021-07-25 DIAGNOSIS — E785 Hyperlipidemia, unspecified: Secondary | ICD-10-CM | POA: Diagnosis not present

## 2021-08-10 DIAGNOSIS — R972 Elevated prostate specific antigen [PSA]: Secondary | ICD-10-CM | POA: Diagnosis not present

## 2021-08-17 DIAGNOSIS — N5201 Erectile dysfunction due to arterial insufficiency: Secondary | ICD-10-CM | POA: Diagnosis not present

## 2021-08-17 DIAGNOSIS — N400 Enlarged prostate without lower urinary tract symptoms: Secondary | ICD-10-CM | POA: Diagnosis not present

## 2021-08-17 DIAGNOSIS — R972 Elevated prostate specific antigen [PSA]: Secondary | ICD-10-CM | POA: Diagnosis not present

## 2021-09-01 DIAGNOSIS — N5201 Erectile dysfunction due to arterial insufficiency: Secondary | ICD-10-CM | POA: Diagnosis not present

## 2021-09-07 ENCOUNTER — Ambulatory Visit (INDEPENDENT_AMBULATORY_CARE_PROVIDER_SITE_OTHER): Payer: HMO | Admitting: Ophthalmology

## 2021-09-07 ENCOUNTER — Other Ambulatory Visit: Payer: Self-pay

## 2021-09-07 ENCOUNTER — Encounter (INDEPENDENT_AMBULATORY_CARE_PROVIDER_SITE_OTHER): Payer: Self-pay | Admitting: Ophthalmology

## 2021-09-07 DIAGNOSIS — E113411 Type 2 diabetes mellitus with severe nonproliferative diabetic retinopathy with macular edema, right eye: Secondary | ICD-10-CM

## 2021-09-07 DIAGNOSIS — E113412 Type 2 diabetes mellitus with severe nonproliferative diabetic retinopathy with macular edema, left eye: Secondary | ICD-10-CM

## 2021-09-07 MED ORDER — BEVACIZUMAB 2.5 MG/0.1ML IZ SOSY
2.5000 mg | PREFILLED_SYRINGE | INTRAVITREAL | Status: AC | PRN
  Administered 2021-09-07: 2.5 mg via INTRAVITREAL

## 2021-09-07 NOTE — Progress Notes (Signed)
09/07/2021     CHIEF COMPLAINT Patient presents for  Chief Complaint  Patient presents with   Diabetic Retinopathy with Macular Edema      HISTORY OF PRESENT ILLNESS: Matthew Zimmerman is a 76 y.o. male who presents to the clinic today for:   HPI   7 weeks dilate OU, Avastin OD, oct.    Last edited by Hurman Horn, MD on 09/07/2021 11:31 AM.      Referring physician: Jani Gravel, MD [place],  [place] [address]  HISTORICAL INFORMATION:   Selected notes from the MEDICAL RECORD NUMBER    Lab Results  Component Value Date   HGBA1C 8.5 (H) 08/05/2020     CURRENT MEDICATIONS: No current outpatient medications on file. (Ophthalmic Drugs)   No current facility-administered medications for this visit. (Ophthalmic Drugs)   Current Outpatient Medications (Other)  Medication Sig   acetaminophen (TYLENOL) 650 MG CR tablet as needed.   apixaban (ELIQUIS) 5 MG TABS tablet Take 1 tablet (5 mg total) by mouth 2 (two) times daily.   atorvastatin (LIPITOR) 40 MG tablet TAKE 1 TABLET(40 MG) BY MOUTH AT BEDTIME   BD INSULIN SYRINGE U/F 31G X 5/16" 0.5 ML MISC 2 (two) times daily.   clindamycin (CLEOCIN T) 1 % external solution Apply topically 2 (two) times daily.   dapagliflozin propanediol (FARXIGA) 10 MG TABS tablet Take 5 mg by mouth daily.   diclofenac Sodium (VOLTAREN) 1 % GEL Apply 2 g topically 4 (four) times daily.   glucose blood test strip Use to test blood sugar twice a day   insulin NPH-regular Human (NOVOLIN 70/30) (70-30) 100 UNIT/ML injection Inject 30 Units into the skin 2 (two) times daily with a meal.    Lancets (ONETOUCH DELICA PLUS [id number]) MISC Use to test blood sugar twice daily   metoprolol succinate (TOPROL-XL) 25 MG 24 hr tablet TAKE 1 TABLET(25 MG) BY MOUTH DAILY WITH OR IMMEDIATELY FOLLOWING A MEAL   NON FORMULARY Take 1 tablet by mouth at bedtime. HeartZest   NON FORMULARY Take 1 tablet by mouth daily. CardioRelax AO    tiZANidine (ZANAFLEX) 2 MG tablet Take 1 tablet (2 mg total) by mouth every 8 (eight) hours as needed for muscle spasms.   vitamin B-12 (CYANOCOBALAMIN) 500 MCG tablet Take 500 mcg by mouth daily.    Vitamin D, Ergocalciferol, (DRISDOL) 1.25 MG (50000 UNIT) CAPS capsule Take 50,000 Units by mouth every Sunday.    No current facility-administered medications for this visit. (Other)      REVIEW OF SYSTEMS: ROS   Negative for: Constitutional, Gastrointestinal, Neurological, Skin, Genitourinary, Musculoskeletal, HENT, Endocrine, Cardiovascular, Eyes, Respiratory, Psychiatric, Allergic/Imm, Heme/Lymph Last edited by Hurman Horn, MD on 09/07/2021 11:31 AM.       ALLERGIES Allergies  Allergen Reactions   Cortisone Other (See Comments)    CBG thrown out of control   Clotrimazole-Betamethasone Other (See Comments)    PAST MEDICAL HISTORY Past Medical History:  Diagnosis Date   Arthritis    Carpal tunnel syndrome on both sides    Chest pain    Coronary artery calcification seen on CAT scan    Diabetes mellitus without complication (HCC)    DVT (deep venous thrombosis) (HCC)    GERD (gastroesophageal reflux disease)    Headache    Hypercholesteremia    Hyperlipidemia    Hypertension    Neck muscle spasm    PE (pulmonary embolism)    Shortness of breath dyspnea  PE    Past Surgical History:  Procedure Laterality Date   BILATERAL CARPAL TUNNEL RELEASE Bilateral    CATARACT EXTRACTION W/PHACO Left 08/05/2006   Dr. Gershon Crane   CATARACT EXTRACTION W/PHACO Right 08/18/2006   Dr. Gershon Crane   COLONOSCOPY     COLONOSCOPY WITH PROPOFOL Left 09/20/2019   Procedure: COLONOSCOPY WITH PROPOFOL;  Surgeon: Ronnette Juniper, MD;  Location: [facility];  Service: Gastroenterology;  Laterality: Left;   COLONOSCOPY WITH PROPOFOL N/A 10/03/2019   Procedure: COLONOSCOPY WITH PROPOFOL;  Surgeon: Wilford Corner, MD;  Location: [facility];  Service: Endoscopy;  Laterality: N/A;   ELBOW SURGERY      BONE SPURS REMOVED B ELBOWS   EYE SURGERY Bilateral    cataracts   FOOT SURGERY     HEEL SPURS REMOVED B HEELS   HEMOSTASIS CLIP PLACEMENT  09/20/2019   Procedure: HEMOSTASIS CLIP PLACEMENT;  Surgeon: Ronnette Juniper, MD;  Location: [facility];  Service: Gastroenterology;;   HEMOSTASIS CLIP PLACEMENT  10/03/2019   Procedure: HEMOSTASIS CLIP PLACEMENT;  Surgeon: Wilford Corner, MD;  Location: [facility];  Service: Endoscopy;;   HOT HEMOSTASIS N/A 09/20/2019   Procedure: HOT HEMOSTASIS (ARGON PLASMA COAGULATION/BICAP);  Surgeon: Ronnette Juniper, MD;  Location: [facility];  Service: Gastroenterology;  Laterality: N/A;   I & D EXTREMITY Right 09/18/2014   Procedure: IRRIGATION AND DEBRIDEMENT RIGHT THUMB, open fracture repair;  Surgeon: Linna Hoff, MD;  Location: [facility];  Service: Orthopedics;  Laterality: Right;   IR IVC FILTER PLMT / S&I /IMG GUID/MOD SED  10/03/2019   LEG SURGERY Left    left thigh table saw accident, "cut to the bone", I&D/suturing   SHOULDER SURGERY Left    Bone spur   SPINE SURGERY  09/04/1985   lumbar spine   SUBMUCOSAL INJECTION  10/03/2019   Procedure: SUBMUCOSAL INJECTION;  Surgeon: Wilford Corner, MD;  Location: [facility];  Service: Endoscopy;;   TOE SURGERY      FAMILY HISTORY Family History  Problem Relation Age of Onset   Cancer Mother    Diabetes Father     SOCIAL HISTORY Social History   Tobacco Use   Smoking status: Former    Packs/day: 0.50    Types: Cigarettes    Quit date: 1964    Years since quitting: 59.0   Smokeless tobacco: Former   Tobacco comments:    smoking since 76 years old  Vaping Use   Vaping Use: Never used  Substance Use Topics   Alcohol use: No   Drug use: No         OPHTHALMIC EXAM:  Base Eye Exam     Visual Acuity (ETDRS)       Right Left   Dist [place] 20/40 -1 20/50 -1   Dist ph [place] 20/30 -1         Tonometry (Tonopen, 11:01 AM)       Right Left   Pressure 17 19         Pupils       Pupils  Dark Light React APD   Right PERRL 3 3 Minimal None   Left PERRL 3 3 Minimal None         Extraocular Movement       Right Left    Full Full         Neuro/Psych     Oriented x3: Yes   Mood/Affect: Normal         Dilation     Both eyes: 1.0%  Mydriacyl, 2.5% Phenylephrine @ 11:01 AM           Slit Lamp and Fundus Exam     External Exam       Right Left   External Normal Normal         Slit Lamp Exam       Right Left   Lids/Lashes Normal Normal   Conjunctiva/Sclera White and quiet White and quiet   Cornea Clear Clear   Anterior Chamber Deep and quiet Deep and quiet   Iris Round and reactive Round and reactive   Lens Posterior chamber intraocular lens Posterior chamber intraocular lens   Anterior Vitreous Normal Normal         Fundus Exam       Right Left   Posterior Vitreous Posterior vitreous detachment Posterior vitreous detachment   Disc cilio retinal artery Normal   C/D Ratio 0.35 0.3   Macula Macular thickening superior and temporally , Microaneurysms, Mild clinically significant macular edema, no exudates Microaneurysms, no macular thickening, Focal laser scars, Epiretinal membrane shiny ILM, no topographic distortion left eye   Vessels Severe NPDR Severe NPDR   Periphery Normal Normal            IMAGING AND PROCEDURES  Imaging and Procedures for 09/07/21  Intravitreal Injection, Pharmacologic Agent - OD - Right Eye       Time Out 09/07/2021. 11:32 AM. Confirmed correct patient, procedure, site, and patient consented.   Anesthesia Topical anesthesia was used. Anesthetic medications included Lidocaine 4%.   Procedure Preparation included Tobramycin 0.3%, 10% betadine to eyelids, 5% betadine to ocular surface. A 30 gauge needle was used.   Injection: 2.5 mg bevacizumab 2.5 MG/0.1ML   Route: Intravitreal, Site: Right Eye   NDC: [phone], Lot: [id number]   Post-op Post injection exam found visual acuity of at least counting  fingers. The patient tolerated the procedure well. There were no complications. The patient received written and verbal post procedure care education. Post injection medications included ocuflox.      OCT, Retina - OU - Both Eyes       Right Eye Quality was good. Scan locations included subfoveal. Central Foveal Thickness: 344. Progression has worsened. Findings include abnormal foveal contour, cystoid macular edema.   Left Eye Quality was good. Scan locations included subfoveal. Central Foveal Thickness: 213. Progression has improved. Findings include abnormal foveal contour.   Notes Minor increase in CSME superior and temporal to the fovea of the right eye with minor center involvement.  Thus regions previously treated with focal have improved.  Recurrence is occurred on the temporal aspect of the fovea, will need intravitreal antivegF OD today  OS, no active CSME will continue to observe             ASSESSMENT/PLAN:  Severe nonproliferative diabetic retinopathy of right eye, with macular edema, associated with type 2 diabetes mellitus ([place])  The nature of diabetic macular edema was discussed with the patient. Treatment options were outlined including medical therapy, laser & vitrectomy. The use of injectable medications reviewed, including Avastin, Lucentis, and Eylea. Periodic injections into the eye are likely to resolve diabetic macular edema (swelling in the center of vision). Initially, injections are delivered are delivered every 4-6 weeks, and the interval extended as the condition improves. On average, 8-9 injections the first year, and 5 in year 2. Improvement in the condition most often improves on medical therapy. Occasional use of focal laser is also recommended for residual macular edema (swelling). Excellent  control of blood glucose and blood pressure are encouraged under the care of a primary physician or endocrinologist. Similarly, attempts to maintain serum  cholesterol, low density lipoproteins, and high-density lipoproteins in a favorable range were recommended.   Repeat intravitreal Avastin OD today and may consider peripheral PRP to decrease vegF burden  Severe nonproliferative diabetic retinopathy of left eye, with macular edema, associated with type 2 diabetes mellitus ([place]) OS stable overall we will continue to observe     ICD-10-CM   1. Severe nonproliferative diabetic retinopathy of right eye, with macular edema, associated with type 2 diabetes mellitus (HCC)  E11.3411 Intravitreal Injection, Pharmacologic Agent - OD - Right Eye    OCT, Retina - OU - Both Eyes    bevacizumab (AVASTIN) SOSY 2.5 mg    2. Severe nonproliferative diabetic retinopathy of left eye, with macular edema, associated with type 2 diabetes mellitus ([place])  I62.7035       1.  2.  3.  Ophthalmic Meds Ordered this visit:  Meds ordered this encounter  Medications   bevacizumab (AVASTIN) SOSY 2.5 mg       Return in about 7 weeks (around 10/26/2021) for DILATE OU, AVASTIN OCT, OD.  There are no Patient Instructions on file for this visit.   Explained the diagnoses, plan, and follow up with the patient and they expressed understanding.  Patient expressed understanding of the importance of proper follow up care.   Clent Demark Ewen Varnell M.D. Diseases & Surgery of the Retina and Vitreous Retina & Diabetic [place] 09/07/21     Abbreviations: M myopia (nearsighted); A astigmatism; H hyperopia (farsighted); P presbyopia; Mrx spectacle prescription;  CTL contact lenses; OD right eye; OS left eye; OU both eyes  XT exotropia; ET esotropia; PEK punctate epithelial keratitis; PEE punctate epithelial erosions; DES dry eye syndrome; MGD meibomian gland dysfunction; ATs artificial tears; PFAT's preservative free artificial tears; [place] nuclear sclerotic cataract; PSC posterior subcapsular cataract; ERM epi-retinal membrane; PVD posterior vitreous detachment; RD retinal  detachment; DM diabetes mellitus; DR diabetic retinopathy; NPDR non-proliferative diabetic retinopathy; PDR proliferative diabetic retinopathy; CSME clinically significant macular edema; DME diabetic macular edema; dbh dot blot hemorrhages; CWS cotton wool spot; POAG primary open angle glaucoma; C/D cup-to-disc ratio; HVF humphrey visual field; GVF goldmann visual field; OCT optical coherence tomography; IOP intraocular pressure; BRVO Branch retinal vein occlusion; CRVO central retinal vein occlusion; CRAO central retinal artery occlusion; BRAO branch retinal artery occlusion; RT retinal tear; SB scleral buckle; PPV pars plana vitrectomy; VH Vitreous hemorrhage; PRP panretinal laser photocoagulation; IVK intravitreal kenalog; VMT vitreomacular traction; MH Macular hole;  NVD neovascularization of the disc; NVE neovascularization elsewhere; AREDS age related eye disease study; ARMD age related macular degeneration; POAG primary open angle glaucoma; EBMD epithelial/anterior basement membrane dystrophy; ACIOL anterior chamber intraocular lens; IOL intraocular lens; PCIOL posterior chamber intraocular lens; Phaco/IOL phacoemulsification with intraocular lens placement; [place] photorefractive keratectomy; LASIK laser assisted in situ keratomileusis; HTN hypertension; DM diabetes mellitus; COPD chronic obstructive pulmonary disease

## 2021-09-07 NOTE — Assessment & Plan Note (Addendum)
The nature of diabetic macular edema was discussed with the patient. Treatment options were outlined including medical therapy, laser & vitrectomy. The use of injectable medications reviewed, including Avastin, Lucentis, and Eylea. Periodic injections into the eye are likely to resolve diabetic macular edema (swelling in the center of vision). Initially, injections are delivered are delivered every 4-6 weeks, and the interval extended as the condition improves. On average, 8-9 injections the first year, and 5 in year 2. Improvement in the condition most often improves on medical therapy. Occasional use of focal laser is also recommended for residual macular edema (swelling). Excellent control of blood glucose and blood pressure are encouraged under the care of a primary physician or endocrinologist. Similarly, attempts to maintain serum cholesterol, low density lipoproteins, and high-density lipoproteins in a favorable range were recommended.   Repeat intravitreal Avastin OD today and may consider peripheral PRP to decrease vegF burden

## 2021-09-07 NOTE — Assessment & Plan Note (Signed)
OS stable overall we will continue to observe

## 2021-10-26 ENCOUNTER — Encounter (INDEPENDENT_AMBULATORY_CARE_PROVIDER_SITE_OTHER): Payer: HMO | Admitting: Ophthalmology

## 2021-10-28 ENCOUNTER — Other Ambulatory Visit: Payer: Self-pay | Admitting: Cardiology

## 2021-10-28 DIAGNOSIS — I10 Essential (primary) hypertension: Secondary | ICD-10-CM

## 2021-11-02 ENCOUNTER — Ambulatory Visit: Payer: HMO | Admitting: Cardiology

## 2021-11-07 ENCOUNTER — Other Ambulatory Visit: Payer: Self-pay

## 2021-11-07 ENCOUNTER — Ambulatory Visit (INDEPENDENT_AMBULATORY_CARE_PROVIDER_SITE_OTHER): Payer: HMO | Admitting: Ophthalmology

## 2021-11-07 ENCOUNTER — Encounter (INDEPENDENT_AMBULATORY_CARE_PROVIDER_SITE_OTHER): Payer: Self-pay | Admitting: Ophthalmology

## 2021-11-07 DIAGNOSIS — E113411 Type 2 diabetes mellitus with severe nonproliferative diabetic retinopathy with macular edema, right eye: Secondary | ICD-10-CM | POA: Diagnosis not present

## 2021-11-07 DIAGNOSIS — E113412 Type 2 diabetes mellitus with severe nonproliferative diabetic retinopathy with macular edema, left eye: Secondary | ICD-10-CM | POA: Diagnosis not present

## 2021-11-07 MED ORDER — BEVACIZUMAB 2.5 MG/0.1ML IZ SOSY
2.5000 mg | PREFILLED_SYRINGE | INTRAVITREAL | Status: AC | PRN
  Administered 2021-11-07: 2.5 mg via INTRAVITREAL

## 2021-11-07 NOTE — Assessment & Plan Note (Signed)
OS no recurrence of CSME will continue to monitor and observe ? ?Severe NPDR also stable ?

## 2021-11-07 NOTE — Progress Notes (Signed)
11/07/2021     CHIEF COMPLAINT Patient presents for  Chief Complaint  Patient presents with   Diabetic Retinopathy with Macular Edema      HISTORY OF PRESENT ILLNESS: Matthew Zimmerman is a 76 y.o. male who presents to the clinic today for:   HPI   7 week for dilate OU avastin oct od. Pt states "I can see far away when Im driving, I can see fine with street lights; there is no problem. My problem is when I am seeing near then I start seeing two lines."   Last edited by Silvestre Moment on 11/07/2021 10:09 AM.      Referring physician: Jani Gravel, MD [place],  [place] [address]  HISTORICAL INFORMATION:   Selected notes from the MEDICAL RECORD NUMBER    Lab Results  Component Value Date   HGBA1C 8.5 (H) 08/05/2020     CURRENT MEDICATIONS: No current outpatient medications on file. (Ophthalmic Drugs)   No current facility-administered medications for this visit. (Ophthalmic Drugs)   Current Outpatient Medications (Other)  Medication Sig   acetaminophen (TYLENOL) 650 MG CR tablet as needed.   apixaban (ELIQUIS) 5 MG TABS tablet Take 1 tablet (5 mg total) by mouth 2 (two) times daily.   atorvastatin (LIPITOR) 40 MG tablet TAKE 1 TABLET(40 MG) BY MOUTH AT BEDTIME   BD INSULIN SYRINGE U/F 31G X 5/16" 0.5 ML MISC 2 (two) times daily.   clindamycin (CLEOCIN T) 1 % external solution Apply topically 2 (two) times daily.   dapagliflozin propanediol (FARXIGA) 10 MG TABS tablet Take 5 mg by mouth daily.   diclofenac Sodium (VOLTAREN) 1 % GEL Apply 2 g topically 4 (four) times daily.   glucose blood test strip Use to test blood sugar twice a day   insulin NPH-regular Human (NOVOLIN 70/30) (70-30) 100 UNIT/ML injection Inject 30 Units into the skin 2 (two) times daily with a meal.    Lancets (ONETOUCH DELICA PLUS [id number]) MISC Use to test blood sugar twice daily   metoprolol succinate (TOPROL-XL) 25 MG 24 hr tablet TAKE 1 TABLET(25 MG) BY MOUTH DAILY WITH OR  IMMEDIATELY FOLLOWING A MEAL   NON FORMULARY Take 1 tablet by mouth at bedtime. HeartZest   NON FORMULARY Take 1 tablet by mouth daily. CardioRelax AO   tiZANidine (ZANAFLEX) 2 MG tablet Take 1 tablet (2 mg total) by mouth every 8 (eight) hours as needed for muscle spasms.   vitamin B-12 (CYANOCOBALAMIN) 500 MCG tablet Take 500 mcg by mouth daily.    Vitamin D, Ergocalciferol, (DRISDOL) 1.25 MG (50000 UNIT) CAPS capsule Take 50,000 Units by mouth every Sunday.    No current facility-administered medications for this visit. (Other)      REVIEW OF SYSTEMS: ROS   Negative for: Constitutional, Gastrointestinal, Neurological, Skin, Genitourinary, Musculoskeletal, HENT, Endocrine, Cardiovascular, Eyes, Respiratory, Psychiatric, Allergic/Imm, Heme/Lymph Last edited by Silvestre Moment on 11/07/2021 10:09 AM.       ALLERGIES Allergies  Allergen Reactions   Cortisone Other (See Comments)    CBG thrown out of control   Clotrimazole-Betamethasone Other (See Comments)    PAST MEDICAL HISTORY Past Medical History:  Diagnosis Date   Arthritis    Carpal tunnel syndrome on both sides    Chest pain    Coronary artery calcification seen on CAT scan    Diabetes mellitus without complication (HCC)    DVT (deep venous thrombosis) (HCC)    GERD (gastroesophageal reflux disease)    Headache  Hypercholesteremia    Hyperlipidemia    Hypertension    Neck muscle spasm    PE (pulmonary embolism)    Shortness of breath dyspnea    PE    Past Surgical History:  Procedure Laterality Date   BILATERAL CARPAL TUNNEL RELEASE Bilateral    CATARACT EXTRACTION W/PHACO Left 08/05/2006   Dr. Gershon Crane   CATARACT EXTRACTION W/PHACO Right 08/18/2006   Dr. Gershon Crane   COLONOSCOPY     COLONOSCOPY WITH PROPOFOL Left 09/20/2019   Procedure: COLONOSCOPY WITH PROPOFOL;  Surgeon: Ronnette Juniper, MD;  Location: [facility];  Service: Gastroenterology;  Laterality: Left;   COLONOSCOPY WITH PROPOFOL N/A 10/03/2019   Procedure:  COLONOSCOPY WITH PROPOFOL;  Surgeon: Wilford Corner, MD;  Location: [facility];  Service: Endoscopy;  Laterality: N/A;   ELBOW SURGERY     BONE SPURS REMOVED B ELBOWS   EYE SURGERY Bilateral    cataracts   FOOT SURGERY     HEEL SPURS REMOVED B HEELS   HEMOSTASIS CLIP PLACEMENT  09/20/2019   Procedure: HEMOSTASIS CLIP PLACEMENT;  Surgeon: Ronnette Juniper, MD;  Location: [facility];  Service: Gastroenterology;;   HEMOSTASIS CLIP PLACEMENT  10/03/2019   Procedure: HEMOSTASIS CLIP PLACEMENT;  Surgeon: Wilford Corner, MD;  Location: [facility];  Service: Endoscopy;;   HOT HEMOSTASIS N/A 09/20/2019   Procedure: HOT HEMOSTASIS (ARGON PLASMA COAGULATION/BICAP);  Surgeon: Ronnette Juniper, MD;  Location: [facility];  Service: Gastroenterology;  Laterality: N/A;   I & D EXTREMITY Right 09/18/2014   Procedure: IRRIGATION AND DEBRIDEMENT RIGHT THUMB, open fracture repair;  Surgeon: Linna Hoff, MD;  Location: [facility];  Service: Orthopedics;  Laterality: Right;   IR IVC FILTER PLMT / S&I /IMG GUID/MOD SED  10/03/2019   LEG SURGERY Left    left thigh table saw accident, "cut to the bone", I&D/suturing   SHOULDER SURGERY Left    Bone spur   SPINE SURGERY  09/04/1985   lumbar spine   SUBMUCOSAL INJECTION  10/03/2019   Procedure: SUBMUCOSAL INJECTION;  Surgeon: Wilford Corner, MD;  Location: [facility];  Service: Endoscopy;;   TOE SURGERY      FAMILY HISTORY Family History  Problem Relation Age of Onset   Cancer Mother    Diabetes Father     SOCIAL HISTORY Social History   Tobacco Use   Smoking status: Former    Packs/day: 0.50    Types: Cigarettes    Quit date: 1964    Years since quitting: 59.2   Smokeless tobacco: Former   Tobacco comments:    smoking since 76 years old  Vaping Use   Vaping Use: Never used  Substance Use Topics   Alcohol use: No   Drug use: No         OPHTHALMIC EXAM:  Base Eye Exam     Visual Acuity (Snellen - Linear)       Right Left   Dist [place]  20/40 -2 20/60   Dist ph [place] 20/30 -2 20/40 -1         Tonometry (Tonopen, 10:17 AM)       Right Left   Pressure 8 11         Pupils       Pupils Dark Light Shape React APD   Right PERRL 4 4 Round Slow None   Left PERRL 4 4 Round Slow None         Extraocular Movement       Right Left    Full Full  Neuro/Psych     Oriented x3: Yes   Mood/Affect: Normal         Dilation     Both eyes: 1.0% Mydriacyl, 2.5% Phenylephrine @ 10:16 AM           Slit Lamp and Fundus Exam     External Exam       Right Left   External Normal Normal         Slit Lamp Exam       Right Left   Lids/Lashes Normal Normal   Conjunctiva/Sclera White and quiet White and quiet   Cornea Clear Clear   Anterior Chamber Deep and quiet Deep and quiet   Iris Round and reactive Round and reactive   Lens Posterior chamber intraocular lens Posterior chamber intraocular lens   Anterior Vitreous Normal Normal         Fundus Exam       Right Left   Posterior Vitreous Posterior vitreous detachment Posterior vitreous detachment   Disc cilio retinal artery Normal   C/D Ratio 0.35 0.3   Macula Macular thickening superior and temporally, Microaneurysms, Mild clinically significant macular edema, no exudates Microaneurysms, no macular thickening, Focal laser scars, Epiretinal membrane shiny ILM, no topographic distortion left eye   Vessels Severe NPDR Severe NPDR   Periphery Normal Normal            IMAGING AND PROCEDURES  Imaging and Procedures for 11/07/21  OCT, Retina - OU - Both Eyes       Right Eye Quality was borderline. Scan locations included subfoveal. Progression has improved. Findings include abnormal foveal contour.   Left Eye Quality was borderline. Scan locations included subfoveal. Findings include normal foveal contour.   Notes OD with less micro CSME, minor ERM present as well.     Intravitreal Injection, Pharmacologic Agent - OD - Right Eye        Time Out 11/07/2021. 11:22 AM. Confirmed correct patient, procedure, site, and patient consented.   Anesthesia Topical anesthesia was used. Anesthetic medications included Lidocaine 4%.   Procedure Preparation included Tobramycin 0.3%, 10% betadine to eyelids, 5% betadine to ocular surface. A 30 gauge needle was used.   Injection: 2.5 mg bevacizumab 2.5 MG/0.1ML   Route: Intravitreal, Site: Right Eye   NDC: [phone], Lot: [id number]   Post-op Post injection exam found visual acuity of at least counting fingers. The patient tolerated the procedure well. There were no complications. The patient received written and verbal post procedure care education. Post injection medications included ocuflox.              ASSESSMENT/PLAN:  Severe nonproliferative diabetic retinopathy of left eye, with macular edema, associated with type 2 diabetes mellitus (HCC) OS no recurrence of CSME will continue to monitor and observe  Severe NPDR also stable     ICD-10-CM   1. Severe nonproliferative diabetic retinopathy of right eye, with macular edema, associated with type 2 diabetes mellitus (HCC)  E11.3411 OCT, Retina - OU - Both Eyes    Intravitreal Injection, Pharmacologic Agent - OD - Right Eye    bevacizumab (AVASTIN) SOSY 2.5 mg    2. Severe nonproliferative diabetic retinopathy of left eye, with macular edema, associated with type 2 diabetes mellitus ([place])  U72.5366       1.  Severe NPDR OU stable, may need peripheral PRP in the future 2.  OD CSME continue to improve now at 7-week follow-up interval.  Repeat injection today and follow up again in 8  weeks  3.  Ophthalmic Meds Ordered this visit:  Meds ordered this encounter  Medications   bevacizumab (AVASTIN) SOSY 2.5 mg       Return in about 8 weeks (around 01/02/2022) for dilate, OD, AVASTIN OCT.  There are no Patient Instructions on file for this visit.   Explained the diagnoses, plan, and follow up with the patient  and they expressed understanding.  Patient expressed understanding of the importance of proper follow up care.   Clent Demark Adler Chartrand M.D. Diseases & Surgery of the Retina and Vitreous [facility] [place] 11/07/21     Abbreviations: M myopia (nearsighted); A astigmatism; H hyperopia (farsighted); P presbyopia; Mrx spectacle prescription;  CTL contact lenses; OD right eye; OS left eye; OU both eyes  XT exotropia; ET esotropia; PEK punctate epithelial keratitis; PEE punctate epithelial erosions; DES dry eye syndrome; MGD meibomian gland dysfunction; ATs artificial tears; PFAT's preservative free artificial tears; [place] nuclear sclerotic cataract; PSC posterior subcapsular cataract; ERM epi-retinal membrane; PVD posterior vitreous detachment; RD retinal detachment; DM diabetes mellitus; DR diabetic retinopathy; NPDR non-proliferative diabetic retinopathy; PDR proliferative diabetic retinopathy; CSME clinically significant macular edema; DME diabetic macular edema; dbh dot blot hemorrhages; CWS cotton wool spot; POAG primary open angle glaucoma; C/D cup-to-disc ratio; HVF humphrey visual field; GVF goldmann visual field; OCT optical coherence tomography; IOP intraocular pressure; BRVO Branch retinal vein occlusion; CRVO central retinal vein occlusion; CRAO central retinal artery occlusion; BRAO branch retinal artery occlusion; RT retinal tear; SB scleral buckle; PPV pars plana vitrectomy; VH Vitreous hemorrhage; PRP panretinal laser photocoagulation; IVK intravitreal kenalog; VMT vitreomacular traction; MH Macular hole;  NVD neovascularization of the disc; NVE neovascularization elsewhere; AREDS age related eye disease study; ARMD age related macular degeneration; POAG primary open angle glaucoma; EBMD epithelial/anterior basement membrane dystrophy; ACIOL anterior chamber intraocular lens; IOL intraocular lens; PCIOL posterior chamber intraocular lens; Phaco/IOL phacoemulsification with intraocular lens  placement; [place] photorefractive keratectomy; LASIK laser assisted in situ keratomileusis; HTN hypertension; DM diabetes mellitus; COPD chronic obstructive pulmonary disease

## 2021-11-28 ENCOUNTER — Ambulatory Visit: Payer: HMO | Admitting: Cardiology

## 2021-11-28 ENCOUNTER — Encounter: Payer: Self-pay | Admitting: Cardiology

## 2021-11-28 ENCOUNTER — Other Ambulatory Visit: Payer: Self-pay

## 2021-11-28 VITALS — BP 139/86 | HR 72 | Temp 97.3°F | Resp 16 | Ht 68.0 in | Wt 194.0 lb

## 2021-11-28 DIAGNOSIS — E1139 Type 2 diabetes mellitus with other diabetic ophthalmic complication: Secondary | ICD-10-CM

## 2021-11-28 DIAGNOSIS — I2510 Atherosclerotic heart disease of native coronary artery without angina pectoris: Secondary | ICD-10-CM

## 2021-11-28 DIAGNOSIS — Z794 Long term (current) use of insulin: Secondary | ICD-10-CM

## 2021-11-28 DIAGNOSIS — I825Y3 Chronic embolism and thrombosis of unspecified deep veins of proximal lower extremity, bilateral: Secondary | ICD-10-CM

## 2021-11-28 DIAGNOSIS — Z95828 Presence of other vascular implants and grafts: Secondary | ICD-10-CM

## 2021-11-28 DIAGNOSIS — I10 Essential (primary) hypertension: Secondary | ICD-10-CM

## 2021-11-28 DIAGNOSIS — E7800 Pure hypercholesterolemia, unspecified: Secondary | ICD-10-CM

## 2021-11-28 DIAGNOSIS — Z86711 Personal history of pulmonary embolism: Secondary | ICD-10-CM

## 2021-11-28 DIAGNOSIS — I4510 Unspecified right bundle-branch block: Secondary | ICD-10-CM

## 2021-11-28 MED ORDER — LOSARTAN POTASSIUM 25 MG PO TABS: 25.0000 mg | ORAL_TABLET | Freq: Every day | ORAL | 0 refills | Status: DC

## 2021-11-28 NOTE — Progress Notes (Signed)
? ?Matthew Zimmerman ?Date of Birth: 03-14-1946 ?MRN: [id number] ?Primary Care Provider:Collins, Hinton Dyer, DO ?Former Cardiology Providers: Jeri Lager, APRN, FNP-C ?Primary Cardiologist: Rex Kras, DO, [facility] (established care 03/09/2020) ?  ?Date: 11/28/21 ?Last Office Visit: 05/04/2021 ? ?Chief Complaint  ?Patient presents with  ? Follow-up  ?  53-monthfollow-up -history of coronary artery calcification, hyperlipidemia, hypertension  ? ? ?HPI  ?Matthew Zimmerman a 76y.o.  male whose past medical history and cardiovascular risk factors include: Hypertension, type 2 diabetes mellitus with complications, hyperlipidemia, former smoker, history of DVT/PE and status post IVC filter on oral anticoagulation, coronary artery calcification by CT scan, advanced age. ? ?Patient is being followed by our practice for the management of hypertension and hyperlipidemia. ? ?Since last office visit patient states that he is doing well from a cardiovascular standpoint.  No hospitalizations or urgent care visits for cardiovascular symptoms. ? ?He does not check his blood pressures at home but the numbers are elevated at today's visit.  Medications reconciled.  Patient states that he took his medications prior to coming into the office. ? ?Patient does have history of DVT/PE for which she is on Eliquis.  Oral anticoagulation is currently managed by other providers and his care team. ? ?[place] ? ?ALLERGIES: ?Allergies  ?Allergen Reactions  ? Cortisone Other (See Comments)  ?  CBG thrown out of control  ? Clotrimazole-Betamethasone Other (See Comments)  ? ? ? ?MEDICATION LIST PRIOR TO VISIT: ?Current Outpatient Medications on File Prior to Visit  ?Medication Sig Dispense Refill  ? acetaminophen (TYLENOL) 650 MG CR tablet as needed.    ? apixaban (ELIQUIS) 5 MG TABS tablet Take 1 tablet (5 mg total) by mouth 2 (two) times daily.    ? atorvastatin (LIPITOR) 40 MG tablet TAKE 1  TABLET(40 MG) BY MOUTH AT BEDTIME 90 tablet 1  ? BD INSULIN SYRINGE U/F 31G X 5/16" 0.5 ML MISC 2 (two) times daily.    ? Betamethasone Diprop-Minoxidil 0.05-5 % SOLN Apply topically.    ? clindamycin (CLEOCIN T) 1 % external solution Apply topically 2 (two) times daily.    ? dapagliflozin propanediol (FARXIGA) 10 MG TABS tablet Take 5 mg by mouth daily.    ? diclofenac Sodium (VOLTAREN) 1 % GEL Apply 2 g topically 4 (four) times daily. 100 g 1  ? glucose blood test strip Use to test blood sugar twice a day    ? insulin NPH-regular Human (NOVOLIN 70/30) (70-30) 100 UNIT/ML injection Inject 30 Units into the skin 2 (two) times daily with a meal.     ? Lancets (ONETOUCH DELICA PLUS [id number] MISC Use to test blood sugar twice daily    ? metoprolol succinate (TOPROL-XL) 25 MG 24 hr tablet TAKE 1 TABLET(25 MG) BY MOUTH DAILY WITH OR IMMEDIATELY FOLLOWING A MEAL 90 tablet 0  ? OZEMPIC, 0.25 OR 0.5 MG/DOSE, 2 MG/1.5ML SOPN Inject 0.5 mg into the skin once a week.    ? tiZANidine (ZANAFLEX) 2 MG tablet Take 1 tablet (2 mg total) by mouth every 8 (eight) hours as needed for muscle spasms. 12 tablet 0  ? vitamin B-12 (CYANOCOBALAMIN) 500 MCG tablet Take 500 mcg by mouth daily.     ? Vitamin D, Ergocalciferol, (DRISDOL) 1.25 MG (50000 UNIT) CAPS capsule Take 50,000 Units by mouth every Sunday.     ? ?No current facility-administered medications on file prior to visit.  ? ? ?PAST MEDICAL HISTORY: ?Past Medical History:  ?Diagnosis  Date  ? Arthritis   ? Carpal tunnel syndrome on both sides   ? Chest pain   ? Coronary artery calcification seen on CAT scan   ? Diabetes mellitus without complication ([place])   ? DVT (deep venous thrombosis) ([place])   ? GERD (gastroesophageal reflux disease)   ? Headache   ? Hypercholesteremia   ? Hyperlipidemia   ? Hypertension   ? Neck muscle spasm   ? PE (pulmonary embolism)   ? Shortness of breath dyspnea   ? PE   ? ? ?PAST SURGICAL HISTORY: ?Past Surgical History:  ?Procedure Laterality Date  ?  BILATERAL CARPAL TUNNEL RELEASE Bilateral   ? CATARACT EXTRACTION W/PHACO Left 08/05/2006  ? Dr. Gershon Crane  ? CATARACT EXTRACTION W/PHACO Right 08/18/2006  ? Dr. Gershon Crane  ? COLONOSCOPY    ? COLONOSCOPY WITH PROPOFOL Left 09/20/2019  ? Procedure: COLONOSCOPY WITH PROPOFOL;  Surgeon: Ronnette Juniper, MD;  Location: [facility];  Service: Gastroenterology;  Laterality: Left;  ? COLONOSCOPY WITH PROPOFOL N/A 10/03/2019  ? Procedure: COLONOSCOPY WITH PROPOFOL;  Surgeon: Wilford Corner, MD;  Location: [facility];  Service: Endoscopy;  Laterality: N/A;  ? ELBOW SURGERY    ? BONE SPURS REMOVED B ELBOWS  ? EYE SURGERY Bilateral   ? cataracts  ? FOOT SURGERY    ? HEEL SPURS REMOVED B HEELS  ? HEMOSTASIS CLIP PLACEMENT  09/20/2019  ? Procedure: HEMOSTASIS CLIP PLACEMENT;  Surgeon: Ronnette Juniper, MD;  Location: [facility];  Service: Gastroenterology;;  ? HEMOSTASIS CLIP PLACEMENT  10/03/2019  ? Procedure: HEMOSTASIS CLIP PLACEMENT;  Surgeon: Wilford Corner, MD;  Location: [facility];  Service: Endoscopy;;  ? HOT HEMOSTASIS N/A 09/20/2019  ? Procedure: HOT HEMOSTASIS (ARGON PLASMA COAGULATION/BICAP);  Surgeon: Ronnette Juniper, MD;  Location: [facility];  Service: Gastroenterology;  Laterality: N/A;  ? I & D EXTREMITY Right 09/18/2014  ? Procedure: IRRIGATION AND DEBRIDEMENT RIGHT THUMB, open fracture repair;  Surgeon: Linna Hoff, MD;  Location: [facility];  Service: Orthopedics;  Laterality: Right;  ? IR IVC FILTER PLMT / S&I /IMG GUID/MOD SED  10/03/2019  ? LEG SURGERY Left   ? left thigh table saw accident, "cut to the bone", I&D/suturing  ? SHOULDER SURGERY Left   ? Bone spur  ? SPINE SURGERY  09/04/1985  ? lumbar spine  ? SUBMUCOSAL INJECTION  10/03/2019  ? Procedure: SUBMUCOSAL INJECTION;  Surgeon: Wilford Corner, MD;  [facility];  Service: Endoscopy;;  ? TOE SURGERY    ? ? ?FAMILY HISTORY: ?The patient's family history includes Cancer in his mother; Diabetes in his father. ?  ?SOCIAL HISTORY:  ?The patient  reports  that he quit smoking about 59 years ago. His smoking use included cigarettes. He has a 6.50 pack-year smoking history. He has quit using smokeless tobacco. He reports that he does not drink alcohol and does not use drugs. ? ?Review of Systems  ?Cardiovascular:  Negative for chest pain, claudication, dyspnea on exertion, leg swelling, near-syncope, orthopnea, palpitations, paroxysmal nocturnal dyspnea and syncope.  ?Respiratory:  Negative for cough, shortness of breath and wheezing.   ? ?PHYSICAL EXAM: ? ?  11/28/2021  ? 11:14 AM 11/28/2021  ? 10:49 AM 11/28/2021  ? 10:48 AM  ?Vitals with BMI  ?Height   '5\' 8"'$   ?Weight   194 lbs  ?BMI   29.5  ?Systolic [phone]  ?Diastolic 86 96 80  ?Pulse 72 67 76  ? ?CONSTITUTIONAL: Well-developed and well-nourished. No acute distress.  ?SKIN: Skin is  warm and dry. No rash noted. No cyanosis. No pallor. No jaundice ?HEAD: Normocephalic and atraumatic.  ?EYES: No scleral icterus ?MOUTH/THROAT: Moist oral membranes.  ?NECK: No JVD present. No thyromegaly noted. No carotid bruits  ?LYMPHATIC: No visible cervical adenopathy.  ?CHEST Normal respiratory effort. No intercostal retractions  ?LUNGS: Clear to auscultation bilaterally.  No stridor. No wheezes. No rales.  ?CARDIOVASCULAR: Regular rate and rhythm, positive S1-S2, no murmurs rubs or gallops appreciated. ?ABDOMINAL: Obese, soft, nontender, nondistended, positive bowel sounds in all 4 quadrants.  No apparent ascites.  ?EXTREMITIES: No peripheral edema.  2+ bilateral femoral pulses, 1+ bilateral popliteal and dorsalis pedis pulses. ?HEMATOLOGIC: No significant bruising ?NEUROLOGIC: Oriented to person, place, and time. Nonfocal. Normal muscle tone.  ?PSYCHIATRIC: Normal mood and affect. Normal behavior. Cooperative ? ?RADIOLOGY: ?CT of abdomen 01/16/2018:  ?Aortic atherosclerosis, in addition to least 2 vessel coronary artery disease.  ?There are calcifications of the aortic valve.  ? ?CTA of chest 02/25/2019: Small pulmonary embolus  in right lower lobe pulmonary arterial branches.  ? ?CARDIAC DATABASE: ?EKG: ?11/28/2021: NSR, 65 bpm, RBBB, left anterior fascicular block. ? ?Echocardiogram: ?05/05/2019: LVEF 55%, mild LVH, grade 1 diast

## 2021-12-24 ENCOUNTER — Other Ambulatory Visit: Payer: Self-pay | Admitting: Cardiology

## 2021-12-24 DIAGNOSIS — E7800 Pure hypercholesterolemia, unspecified: Secondary | ICD-10-CM

## 2022-01-02 ENCOUNTER — Encounter (INDEPENDENT_AMBULATORY_CARE_PROVIDER_SITE_OTHER): Payer: HMO | Admitting: Ophthalmology

## 2022-01-09 ENCOUNTER — Ambulatory Visit (INDEPENDENT_AMBULATORY_CARE_PROVIDER_SITE_OTHER): Payer: HMO | Admitting: Ophthalmology

## 2022-01-09 ENCOUNTER — Encounter (INDEPENDENT_AMBULATORY_CARE_PROVIDER_SITE_OTHER): Payer: HMO | Admitting: Ophthalmology

## 2022-01-09 ENCOUNTER — Encounter (INDEPENDENT_AMBULATORY_CARE_PROVIDER_SITE_OTHER): Payer: Self-pay | Admitting: Ophthalmology

## 2022-01-09 DIAGNOSIS — E113411 Type 2 diabetes mellitus with severe nonproliferative diabetic retinopathy with macular edema, right eye: Secondary | ICD-10-CM

## 2022-01-09 DIAGNOSIS — E113412 Type 2 diabetes mellitus with severe nonproliferative diabetic retinopathy with macular edema, left eye: Secondary | ICD-10-CM | POA: Diagnosis not present

## 2022-01-09 DIAGNOSIS — H35372 Puckering of macula, left eye: Secondary | ICD-10-CM

## 2022-01-09 MED ORDER — BEVACIZUMAB 2.5 MG/0.1ML IZ SOSY
2.5000 mg | PREFILLED_SYRINGE | INTRAVITREAL | Status: AC | PRN
  Administered 2022-01-09: 2.5 mg via INTRAVITREAL

## 2022-01-09 NOTE — Progress Notes (Signed)
? ? ?01/09/2022 ? ?  ? ?CHIEF COMPLAINT ?Patient presents for  ?Chief Complaint  ?Patient presents with  ? Diabetic Retinopathy with Macular Edema  ? ? ? ? ?HISTORY OF PRESENT ILLNESS: ?Matthew Zimmerman is a 76 y.o. male who presents to the clinic today for:  ? ?HPI   ?9 weeks dilate OD, Avastin OD, OCT. ?Patient states vision is stable and unchanged since last visit. Denies any new floaters or FOL. ?Patient reports he has not had any hospitalizations or surgeries since his last visit with Dr. Zadie Rhine. ?Last edited by Laurin Coder on 01/09/2022  9:23 AM.  ?  ? ? ?Referring physician: ?Jani Gravel, MD ?[address] ?[address] ?[place],  [place] [address] ? ?HISTORICAL INFORMATION:  ? ?Selected notes from the [facility] ?  ? ?Lab Results  ?Component Value Date  ? HGBA1C 8.5 (H) 08/05/2020  ?  ? ?CURRENT MEDICATIONS: ?No current outpatient medications on file. (Ophthalmic Drugs)  ? ?No current facility-administered medications for this visit. (Ophthalmic Drugs)  ? ?Current Outpatient Medications (Other)  ?Medication Sig  ? acetaminophen (TYLENOL) 650 MG CR tablet as needed.  ? apixaban (ELIQUIS) 5 MG TABS tablet Take 1 tablet (5 mg total) by mouth 2 (two) times daily.  ? atorvastatin (LIPITOR) 40 MG tablet TAKE 1 TABLET(40 MG) BY MOUTH AT BEDTIME  ? BD INSULIN SYRINGE U/F 31G X 5/16" 0.5 ML MISC 2 (two) times daily.  ? Betamethasone Diprop-Minoxidil 0.05-5 % SOLN Apply topically.  ? clindamycin (CLEOCIN T) 1 % external solution Apply topically 2 (two) times daily.  ? dapagliflozin propanediol (FARXIGA) 10 MG TABS tablet Take 5 mg by mouth daily.  ? diclofenac Sodium (VOLTAREN) 1 % GEL Apply 2 g topically 4 (four) times daily.  ? glucose blood test strip Use to test blood sugar twice a day  ? insulin NPH-regular Human (NOVOLIN 70/30) (70-30) 100 UNIT/ML injection Inject 30 Units into the skin 2 (two) times daily with a meal.   ? Lancets (ONETOUCH DELICA PLUS [id number]) MISC Use to test blood sugar twice daily  ?  losartan (COZAAR) 25 MG tablet Take 1 tablet (25 mg total) by mouth daily.  ? metoprolol succinate (TOPROL-XL) 25 MG 24 hr tablet TAKE 1 TABLET(25 MG) BY MOUTH DAILY WITH OR IMMEDIATELY FOLLOWING A MEAL  ? OZEMPIC, 0.25 OR 0.5 MG/DOSE, 2 MG/1.5ML SOPN Inject 0.5 mg into the skin once a week.  ? tiZANidine (ZANAFLEX) 2 MG tablet Take 1 tablet (2 mg total) by mouth every 8 (eight) hours as needed for muscle spasms.  ? vitamin B-12 (CYANOCOBALAMIN) 500 MCG tablet Take 500 mcg by mouth daily.   ? Vitamin D, Ergocalciferol, (DRISDOL) 1.25 MG (50000 UNIT) CAPS capsule Take 50,000 Units by mouth every Sunday.   ? ?No current facility-administered medications for this visit. (Other)  ? ? ? ? ?REVIEW OF SYSTEMS: ?ROS   ?Positive for: Neurological ?Last edited by Hurman Horn, MD on 01/09/2022 10:05 AM.  ?  ? ? ? ?ALLERGIES ?Allergies  ?Allergen Reactions  ? Cortisone Other (See Comments)  ?  CBG thrown out of control  ? Clotrimazole-Betamethasone Other (See Comments)  ? ? ?PAST MEDICAL HISTORY ?Past Medical History:  ?Diagnosis Date  ? Arthritis   ? Carpal tunnel syndrome on both sides   ? Chest pain   ? Coronary artery calcification seen on CAT scan   ? DVT (deep venous thrombosis) ([facility])   ? GERD (gastroesophageal reflux disease)   ? Headache   ?  Hypercholesteremia   ? Hyperlipidemia   ? Hypertension   ? Neck muscle spasm   ? PE (pulmonary embolism)   ? Shortness of breath dyspnea   ? PE   ? ?Past Surgical History:  ?Procedure Laterality Date  ? BILATERAL CARPAL TUNNEL RELEASE Bilateral   ? CATARACT EXTRACTION W/PHACO Left 08/05/2006  ? Dr. Gershon Crane  ? CATARACT EXTRACTION W/PHACO Right 08/18/2006  ? Dr. Gershon Crane  ? COLONOSCOPY    ? COLONOSCOPY WITH PROPOFOL Left 09/20/2019  ? Procedure: COLONOSCOPY WITH PROPOFOL;  Surgeon: Ronnette Juniper, MD;  Location: [facility];  Service: Gastroenterology;  Laterality: Left;  ? COLONOSCOPY WITH PROPOFOL N/A 10/03/2019  ? Procedure: COLONOSCOPY WITH PROPOFOL;  Surgeon: Wilford Corner, MD;   Location: [facility];  Service: Endoscopy;  Laterality: N/A;  ? ELBOW SURGERY    ? BONE SPURS REMOVED B ELBOWS  ? EYE SURGERY Bilateral   ? cataracts  ? FOOT SURGERY    ? HEEL SPURS REMOVED B HEELS  ? HEMOSTASIS CLIP PLACEMENT  09/20/2019  ? Procedure: HEMOSTASIS CLIP PLACEMENT;  Surgeon: Ronnette Juniper, MD;  Location: [facility];  Service: Gastroenterology;;  ? HEMOSTASIS CLIP PLACEMENT  10/03/2019  ? Procedure: HEMOSTASIS CLIP PLACEMENT;  Surgeon: Wilford Corner, MD;  [facility]: [facility];  Service: Endoscopy;;  ? HOT HEMOSTASIS N/A 09/20/2019  ? Procedure: HOT HEMOSTASIS (ARGON PLASMA COAGULATION/BICAP);  Surgeon: Ronnette Juniper, MD;  Location: [facility];  Service: Gastroenterology;  Laterality: N/A;  ? I & D EXTREMITY Right 09/18/2014  ? Procedure: IRRIGATION AND DEBRIDEMENT RIGHT THUMB, open fracture repair;  Surgeon: Linna Hoff, MD;  Location: [facility];  Service: Orthopedics;  Laterality: Right;  ? IR IVC FILTER PLMT / S&I /IMG GUID/MOD SED  10/03/2019  ? LEG SURGERY Left   ? left thigh table saw accident, "cut to the bone", I&D/suturing  ? SHOULDER SURGERY Left   ? Bone spur  ? SPINE SURGERY  09/04/1985  ? lumbar spine  ? SUBMUCOSAL INJECTION  10/03/2019  ? Procedure: SUBMUCOSAL INJECTION;  Surgeon: Wilford Corner, MD;  [facility];  Service: Endoscopy;;  ? TOE SURGERY    ? ? ?FAMILY HISTORY ?Family History  ?Problem Relation Age of Onset  ? Cancer Mother   ? Diabetes Father   ? ? ?SOCIAL HISTORY ?Social History  ? ?Tobacco Use  ? Smoking status: Former  ?  Packs/day: 0.50  ?  Years: 13.00  ?  Pack years: 6.50  ?  Types: Cigarettes  ?  Quit date: 1964  ?  Years since quitting: 59.3  ? Smokeless tobacco: Former  ? Tobacco comments:  ?  smoking since 89 years oldup until the age of 76yr old  ?Vaping Use  ? Vaping Use: Never used  ?Substance Use Topics  ? Alcohol use: No  ? Drug use: No  ? ?  ? ?  ? ?OPHTHALMIC EXAM: ? ?Base Eye Exam   ? ? Visual Acuity (ETDRS)   ? ?   Right Left  ? Dist [place] 20/30 -1  20/60 -1  ? Dist ph [place]  20/40 -1  ? ?  ?  ? ? Tonometry (Tonopen, 9:26 AM)   ? ?   Right Left  ? Pressure 12 11  ? ?  ?  ? ? Pupils   ? ?   Pupils Dark Light React APD  ? Right PERRL 4 3 Slow None  ? Left PERRL 4 3 Slow None  ? ?  ?  ? ? Visual Fields   ? ?  Left Right  ?  Full Full  ? ?  ?  ? ? Extraocular Movement   ? ?   Right Left  ?  Full Full  ? ?  ?  ? ? Neuro/Psych   ? ? Oriented x3: Yes  ? Mood/Affect: Normal  ? ?  ?  ? ? Dilation   ? ? Right eye: 1.0% Mydriacyl, 2.5% Phenylephrine @ 9:26 AM  ? ?  ?  ? ?  ? ?Slit Lamp and Fundus Exam   ? ? External Exam   ? ?   Right Left  ? External Normal Normal  ? ?  ?  ? ? Slit Lamp Exam   ? ?   Right Left  ? Lids/Lashes Normal Normal  ? Conjunctiva/Sclera White and quiet White and quiet  ? Cornea Clear Clear  ? Anterior Chamber Deep and quiet Deep and quiet  ? Iris Round and reactive Round and reactive  ? Lens Posterior chamber intraocular lens Posterior chamber intraocular lens  ? Anterior Vitreous Normal Normal  ? ?  ?  ? ? Fundus Exam   ? ?   Right Left  ? Posterior Vitreous Posterior vitreous detachment   ? Disc cilio retinal artery   ? C/D Ratio 0.35   ? Macula Macular thickening superior and temporally, Microaneurysms, Mild clinically significant macular edema, no exudates   ? Vessels Severe NPDR   ? Periphery Normal   ? ?  ?  ? ?  ? ? ?IMAGING AND PROCEDURES  ?Imaging and Procedures for 01/09/22 ? ?OCT, Retina - OU - Both Eyes   ? ?   ?Right Eye ?Quality was borderline. Scan locations included subfoveal. Central Foveal Thickness: 371. Progression has improved. Findings include abnormal foveal contour.  ? ?Left Eye ?Quality was borderline. Scan locations included subfoveal. Central Foveal Thickness: 210. Findings include normal foveal contour.  ? ?Notes ?OD with less micro CSME, minor ERM present as well.  We will repeat injection OD today ? ?  ? ?Intravitreal Injection, Pharmacologic Agent - OD - Right Eye   ? ?   ?Time Out ?01/09/2022. 10:06 AM. Confirmed  correct patient, procedure, site, and patient consented.  ? ?Anesthesia ?Topical anesthesia was used. Anesthetic medications included Lidocaine 4%.  ? ?Procedure ?Preparation included Tobramycin 0.3%, 10%

## 2022-01-09 NOTE — Assessment & Plan Note (Signed)
No sign of recurrence of CSME OS will need dilation examination next monitor severe NPDR ?

## 2022-01-09 NOTE — Assessment & Plan Note (Signed)
Minor no impact on acuity 

## 2022-01-09 NOTE — Assessment & Plan Note (Signed)
Perifoveal CSME slightly improved over the last 2 months post most recent evaluation injection.  Repeat injection today reevaluate again in 7 weeks ?

## 2022-01-25 ENCOUNTER — Other Ambulatory Visit: Payer: Self-pay | Admitting: Cardiology

## 2022-01-25 DIAGNOSIS — I10 Essential (primary) hypertension: Secondary | ICD-10-CM

## 2022-02-26 ENCOUNTER — Other Ambulatory Visit: Payer: Self-pay | Admitting: Cardiology

## 2022-02-26 DIAGNOSIS — I10 Essential (primary) hypertension: Secondary | ICD-10-CM

## 2022-02-26 DIAGNOSIS — E1139 Type 2 diabetes mellitus with other diabetic ophthalmic complication: Secondary | ICD-10-CM

## 2022-02-27 ENCOUNTER — Encounter (INDEPENDENT_AMBULATORY_CARE_PROVIDER_SITE_OTHER): Payer: HMO | Admitting: Ophthalmology

## 2022-03-13 ENCOUNTER — Ambulatory Visit (INDEPENDENT_AMBULATORY_CARE_PROVIDER_SITE_OTHER): Payer: HMO | Admitting: Ophthalmology

## 2022-03-13 ENCOUNTER — Encounter (INDEPENDENT_AMBULATORY_CARE_PROVIDER_SITE_OTHER): Payer: Self-pay | Admitting: Ophthalmology

## 2022-03-13 DIAGNOSIS — E113412 Type 2 diabetes mellitus with severe nonproliferative diabetic retinopathy with macular edema, left eye: Secondary | ICD-10-CM

## 2022-03-13 DIAGNOSIS — H35372 Puckering of macula, left eye: Secondary | ICD-10-CM

## 2022-03-13 DIAGNOSIS — H04123 Dry eye syndrome of bilateral lacrimal glands: Secondary | ICD-10-CM | POA: Diagnosis not present

## 2022-03-13 DIAGNOSIS — E113411 Type 2 diabetes mellitus with severe nonproliferative diabetic retinopathy with macular edema, right eye: Secondary | ICD-10-CM

## 2022-03-13 MED ORDER — BEVACIZUMAB 2.5 MG/0.1ML IZ SOSY
2.5000 mg | PREFILLED_SYRINGE | INTRAVITREAL | Status: AC | PRN
  Administered 2022-03-13: 2.5 mg via INTRAVITREAL

## 2022-03-13 NOTE — Assessment & Plan Note (Signed)
Clinically visible but no topographic distortion

## 2022-03-13 NOTE — Assessment & Plan Note (Signed)
Continue on artificial tears

## 2022-03-13 NOTE — Assessment & Plan Note (Signed)
Much less thickening OS, no sign of recurrence we will continue to observe

## 2022-03-13 NOTE — Assessment & Plan Note (Signed)
Much improved center involved CSME even at 9-week interval post Avastin.  We will repeat injection today to maintain and continue to monitor peripheral retinopathy

## 2022-03-13 NOTE — Progress Notes (Signed)
03/13/2022     CHIEF COMPLAINT Patient presents for  Chief Complaint  Patient presents with   Diabetic Retinopathy with Macular Edema      HISTORY OF PRESENT ILLNESS: Matthew Zimmerman is a 76 y.o. male who presents to the clinic today for:     Referring physician: Janie Morning, DO [address] [place],  [place] [address]  HISTORICAL INFORMATION:   Selected notes from the MEDICAL RECORD NUMBER    Lab Results  Component Value Date   HGBA1C 8.5 (H) 08/05/2020     CURRENT MEDICATIONS: No current outpatient medications on file. (Ophthalmic Drugs)   No current facility-administered medications for this visit. (Ophthalmic Drugs)   Current Outpatient Medications (Other)  Medication Sig   acetaminophen (TYLENOL) 650 MG CR tablet as needed.   apixaban (ELIQUIS) 5 MG TABS tablet Take 1 tablet (5 mg total) by mouth 2 (two) times daily.   atorvastatin (LIPITOR) 40 MG tablet TAKE 1 TABLET(40 MG) BY MOUTH AT BEDTIME   BD INSULIN SYRINGE U/F 31G X 5/16" 0.5 ML MISC 2 (two) times daily.   Betamethasone Diprop-Minoxidil 0.05-5 % SOLN Apply topically.   clindamycin (CLEOCIN T) 1 % external solution Apply topically 2 (two) times daily.   dapagliflozin propanediol (FARXIGA) 10 MG TABS tablet Take 5 mg by mouth daily.   diclofenac Sodium (VOLTAREN) 1 % GEL Apply 2 g topically 4 (four) times daily.   glucose blood test strip Use to test blood sugar twice a day   insulin NPH-regular Human (NOVOLIN 70/30) (70-30) 100 UNIT/ML injection Inject 30 Units into the skin 2 (two) times daily with a meal.    Lancets (ONETOUCH DELICA PLUS [id number]) MISC Use to test blood sugar twice daily   losartan (COZAAR) 25 MG tablet TAKE 1 TABLET(25 MG) BY MOUTH DAILY   metoprolol succinate (TOPROL-XL) 25 MG 24 hr tablet TAKE 1 TABLET(25 MG) BY MOUTH DAILY WITH OR IMMEDIATELY FOLLOWING A MEAL   OZEMPIC, 0.25 OR 0.5 MG/DOSE, 2 MG/1.5ML SOPN Inject 0.5 mg into the skin once a week.   tiZANidine  (ZANAFLEX) 2 MG tablet Take 1 tablet (2 mg total) by mouth every 8 (eight) hours as needed for muscle spasms.   vitamin B-12 (CYANOCOBALAMIN) 500 MCG tablet Take 500 mcg by mouth daily.    Vitamin D, Ergocalciferol, (DRISDOL) 1.25 MG (50000 UNIT) CAPS capsule Take 50,000 Units by mouth every Sunday.    No current facility-administered medications for this visit. (Other)      REVIEW OF SYSTEMS: ROS   Negative for: Constitutional, Gastrointestinal, Neurological, Skin, Genitourinary, Musculoskeletal, HENT, Endocrine, Cardiovascular, Eyes, Respiratory, Psychiatric, Allergic/Imm, Heme/Lymph Last edited by Hurman Horn, MD on 03/13/2022  9:16 AM.       ALLERGIES Allergies  Allergen Reactions   Cortisone Other (See Comments)    CBG thrown out of control   Clotrimazole-Betamethasone Other (See Comments)    PAST MEDICAL HISTORY Past Medical History:  Diagnosis Date   Arthritis    Carpal tunnel syndrome on both sides    Chest pain    Coronary artery calcification seen on CAT scan    DVT (deep venous thrombosis) ([facility])    GERD (gastroesophageal reflux disease)    Headache    Hypercholesteremia    Hyperlipidemia    Hypertension    Neck muscle spasm    PE (pulmonary embolism)    Shortness of breath dyspnea    PE    Past Surgical History:  Procedure Laterality Date   BILATERAL  CARPAL TUNNEL RELEASE Bilateral    CATARACT EXTRACTION W/PHACO Left 08/05/2006   Dr. Gershon Crane   CATARACT EXTRACTION W/PHACO Right 08/18/2006   Dr. Gershon Crane   COLONOSCOPY     COLONOSCOPY WITH PROPOFOL Left 09/20/2019   Procedure: COLONOSCOPY WITH PROPOFOL;  Surgeon: Ronnette Juniper, MD;  Location: [facility];  Service: Gastroenterology;  Laterality: Left;   COLONOSCOPY WITH PROPOFOL N/A 10/03/2019   Procedure: COLONOSCOPY WITH PROPOFOL;  Surgeon: Wilford Corner, MD;  Location: [facility];  Service: Endoscopy;  Laterality: N/A;   ELBOW SURGERY     BONE SPURS REMOVED B ELBOWS   EYE SURGERY Bilateral     cataracts   FOOT SURGERY     HEEL SPURS REMOVED B HEELS   HEMOSTASIS CLIP PLACEMENT  09/20/2019   Procedure: HEMOSTASIS CLIP PLACEMENT;  Surgeon: Ronnette Juniper, MD;  Location: [facility];  Service: Gastroenterology;;   HEMOSTASIS CLIP PLACEMENT  10/03/2019   Procedure: HEMOSTASIS CLIP PLACEMENT;  Surgeon: Wilford Corner, MD;  [facility];  Service: Endoscopy;;   HOT HEMOSTASIS N/A 09/20/2019   Procedure: HOT HEMOSTASIS (ARGON PLASMA COAGULATION/BICAP);  Surgeon: Ronnette Juniper, MD;  Location: [facility];  Service: Gastroenterology;  Laterality: N/A;   I & D EXTREMITY Right 09/18/2014   Procedure: IRRIGATION AND DEBRIDEMENT RIGHT THUMB, open fracture repair;  Surgeon: Linna Hoff, MD;  Location: [facility];  Service: Orthopedics;  Laterality: Right;   IR IVC FILTER PLMT / S&I /IMG GUID/MOD SED  10/03/2019   LEG SURGERY Left    left thigh table saw accident, "cut to the bone", I&D/suturing   SHOULDER SURGERY Left    Bone spur   SPINE SURGERY  09/04/1985   lumbar spine   SUBMUCOSAL INJECTION  10/03/2019   Procedure: SUBMUCOSAL INJECTION;  Surgeon: Wilford Corner, MD;  [facility];  Service: Endoscopy;;   TOE SURGERY      FAMILY HISTORY Family History  Problem Relation Age of Onset   Cancer Mother    Diabetes Father     SOCIAL HISTORY Social History   Tobacco Use   Smoking status: Former    Packs/day: 0.50    Years: 13.00    Total pack years: 6.50    Types: Cigarettes    Quit date: 1964    Years since quitting: 59.5   Smokeless tobacco: Former   Tobacco comments:    smoking since 25 years oldup until the age of 76yr old  Vaping Use   Vaping Use: Never used  Substance Use Topics   Alcohol use: No   Drug use: No         OPHTHALMIC EXAM:  Base Eye Exam     Visual Acuity (ETDRS)       Right Left   Dist [place] 20/40 +2 20/40 -3   Dist ph [place] NI NI         Tonometry (Tonopen, 9:20 AM)       Right Left   Pressure 9 10         Pupils        Pupils APD   Right PERRL None   Left PERRL None         Visual Fields       Left Right    Full Full         Extraocular Movement       Right Left    Full, Ortho Full, Ortho         Neuro/Psych     Oriented x3: Yes  Mood/Affect: Normal         Dilation     Right eye: 1.0% Mydriacyl, 2.5% Phenylephrine @ 9:18 AM           Slit Lamp and Fundus Exam     External Exam       Right Left   External Normal Normal         Slit Lamp Exam       Right Left   Lids/Lashes Normal Normal   Conjunctiva/Sclera White and quiet White and quiet   Cornea Clear Clear   Anterior Chamber Deep and quiet Deep and quiet   Iris Round and reactive Round and reactive   Lens Posterior chamber intraocular lens Posterior chamber intraocular lens   Anterior Vitreous Normal Normal         Fundus Exam       Right Left   Posterior Vitreous Posterior vitreous detachment    Disc cilio retinal artery    C/D Ratio 0.35    Macula Macular thickening superior and temporally, Microaneurysms, Mild clinically significant macular edema, no exudates    Vessels Severe NPDR    Periphery Normal             IMAGING AND PROCEDURES  Imaging and Procedures for 03/13/22  Intravitreal Injection, Pharmacologic Agent - OD - Right Eye       Time Out 03/13/2022. 9:43 AM. Confirmed correct patient, procedure, site, and patient consented.   Anesthesia Topical anesthesia was used. Anesthetic medications included Lidocaine 4%.   Procedure Preparation included 5% betadine to ocular surface, 10% betadine to eyelids, Tobramycin 0.3%. A 30 gauge needle was used.   Injection: 2.5 mg bevacizumab 2.5 MG/0.1ML   Route: Intravitreal, Site: Right Eye   NDC: [id number], Lot: [id number], Expiration date: 05/05/2022   Post-op Post injection exam found visual acuity of at least counting fingers. The patient tolerated the procedure well. There were no complications. The patient received written and  verbal post procedure care education. Post injection medications included ocuflox.      OCT, Retina - OU - Both Eyes       Right Eye Quality was borderline. Scan locations included subfoveal. Central Foveal Thickness: 351. Progression has improved. Findings include abnormal foveal contour.   Left Eye Quality was borderline. Scan locations included subfoveal. Central Foveal Thickness: 214. Findings include normal foveal contour.   Notes OD with less micro CSME, minor ERM present as well.  We will repeat injection OD today , at 9 week interval repeat examination              ASSESSMENT/PLAN:  Severe nonproliferative diabetic retinopathy of right eye, with macular edema, associated with type 2 diabetes mellitus ([facility]) Much improved center involved CSME even at 9-week interval post Avastin.  We will repeat injection today to maintain and continue to monitor peripheral retinopathy  Severe nonproliferative diabetic retinopathy of left eye, with macular edema, associated with type 2 diabetes mellitus ([facility]) Much less thickening OS, no sign of recurrence we will continue to observe  Macular pucker, left eye Clinically visible but no topographic distortion  Dry eyes, bilateral Continue on artificial tears     ICD-10-CM   1. Severe nonproliferative diabetic retinopathy of right eye, with macular edema, associated with type 2 diabetes mellitus ([facility])  E11.3411 Intravitreal Injection, Pharmacologic Agent - OD - Right Eye    OCT, Retina - OU - Both Eyes    bevacizumab (AVASTIN) SOSY 2.5 mg    2. Severe nonproliferative diabetic  retinopathy of left eye, with macular edema, associated with type 2 diabetes mellitus ([facility])  D53.2992 Intravitreal Injection, Pharmacologic Agent - OD - Right Eye    OCT, Retina - OU - Both Eyes    bevacizumab (AVASTIN) SOSY 2.5 mg    3. Macular pucker, left eye  H35.372     4. Dry eyes, bilateral  H04.123       Bilateral severe NPDR.  Much less CSME OD  post injection Avastin at 9-week interval OD.  2.  OS remained stable by OCT  3.  Use artificial tears as needed  Ophthalmic Meds Ordered this visit:  Meds ordered this encounter  Medications   bevacizumab (AVASTIN) SOSY 2.5 mg       Return in about 9 weeks (around 05/15/2022) for DILATE OU, AVASTIN OCT, OD.  There are no Patient Instructions on file for this visit.   Explained the diagnoses, plan, and follow up with the patient and they expressed understanding.  Patient expressed understanding of the importance of proper follow up care.   Clent Demark Emerita Berkemeier M.D. Diseases & Surgery of the Retina and Vitreous Retina & Diabetic [place] 03/13/22     Abbreviations: M myopia (nearsighted); A astigmatism; H hyperopia (farsighted); P presbyopia; Mrx spectacle prescription;  CTL contact lenses; OD right eye; OS left eye; OU both eyes  XT exotropia; ET esotropia; PEK punctate epithelial keratitis; PEE punctate epithelial erosions; DES dry eye syndrome; MGD meibomian gland dysfunction; ATs artificial tears; PFAT's preservative free artificial tears; [place] nuclear sclerotic cataract; PSC posterior subcapsular cataract; ERM epi-retinal membrane; PVD posterior vitreous detachment; RD retinal detachment; DM diabetes mellitus; DR diabetic retinopathy; NPDR non-proliferative diabetic retinopathy; PDR proliferative diabetic retinopathy; CSME clinically significant macular edema; DME diabetic macular edema; dbh dot blot hemorrhages; CWS cotton wool spot; POAG primary open angle glaucoma; C/D cup-to-disc ratio; HVF humphrey visual field; GVF goldmann visual field; OCT optical coherence tomography; IOP intraocular pressure; BRVO Branch retinal vein occlusion; CRVO central retinal vein occlusion; CRAO central retinal artery occlusion; BRAO branch retinal artery occlusion; RT retinal tear; SB scleral buckle; PPV pars plana vitrectomy; VH Vitreous hemorrhage; PRP panretinal laser photocoagulation; IVK  intravitreal kenalog; VMT vitreomacular traction; MH Macular hole;  NVD neovascularization of the disc; NVE neovascularization elsewhere; AREDS age related eye disease study; ARMD age related macular degeneration; POAG primary open angle glaucoma; EBMD epithelial/anterior basement membrane dystrophy; ACIOL anterior chamber intraocular lens; IOL intraocular lens; PCIOL posterior chamber intraocular lens; Phaco/IOL phacoemulsification with intraocular lens placement; [place] photorefractive keratectomy; LASIK laser assisted in situ keratomileusis; HTN hypertension; DM diabetes mellitus; COPD chronic obstructive pulmonary disease

## 2022-04-23 ENCOUNTER — Other Ambulatory Visit: Payer: Self-pay | Admitting: Cardiology

## 2022-04-23 DIAGNOSIS — I10 Essential (primary) hypertension: Secondary | ICD-10-CM

## 2022-05-15 ENCOUNTER — Encounter (INDEPENDENT_AMBULATORY_CARE_PROVIDER_SITE_OTHER): Payer: HMO | Admitting: Ophthalmology

## 2022-05-31 ENCOUNTER — Ambulatory Visit: Payer: HMO | Admitting: Cardiology

## 2022-05-31 ENCOUNTER — Encounter: Payer: Self-pay | Admitting: Cardiology

## 2022-05-31 VITALS — BP 112/62 | HR 62 | Temp 97.9°F | Resp 16 | Ht 68.0 in | Wt 188.8 lb

## 2022-05-31 DIAGNOSIS — Z794 Long term (current) use of insulin: Secondary | ICD-10-CM

## 2022-05-31 DIAGNOSIS — I10 Essential (primary) hypertension: Secondary | ICD-10-CM

## 2022-05-31 DIAGNOSIS — E7800 Pure hypercholesterolemia, unspecified: Secondary | ICD-10-CM

## 2022-05-31 DIAGNOSIS — I825Y3 Chronic embolism and thrombosis of unspecified deep veins of proximal lower extremity, bilateral: Secondary | ICD-10-CM

## 2022-05-31 DIAGNOSIS — I4510 Unspecified right bundle-branch block: Secondary | ICD-10-CM

## 2022-05-31 DIAGNOSIS — Z86711 Personal history of pulmonary embolism: Secondary | ICD-10-CM

## 2022-05-31 DIAGNOSIS — E1139 Type 2 diabetes mellitus with other diabetic ophthalmic complication: Secondary | ICD-10-CM

## 2022-05-31 DIAGNOSIS — Z95828 Presence of other vascular implants and grafts: Secondary | ICD-10-CM

## 2022-05-31 DIAGNOSIS — I2510 Atherosclerotic heart disease of native coronary artery without angina pectoris: Secondary | ICD-10-CM

## 2022-05-31 NOTE — Progress Notes (Signed)
Aadarsh Cozort Date of Birth: 12-May-1946 MRN: [id number] Primary Care Provider:Collins, Hinton Dyer, DO Former Cardiology Providers: Jeri Lager, APRN, FNP-C Primary Cardiologist: Rex Kras, DO, [facility] (established care 03/09/2020)   Date: 05/31/22 Last Office Visit: 11/28/2021  Chief Complaint  Patient presents with   Coronary artery calcification   Hyperlipidemia   Hypertension   Follow-up    HPI  Alexa Golebiewski is a 76 y.o.  male whose past medical history and cardiovascular risk factors include: Hypertension, type 2 diabetes mellitus with complications, hyperlipidemia, former smoker, history of DVT/PE and status post IVC filter on oral anticoagulation, coronary artery calcification by CT scan, advanced age.  Patient presents today for 87-monthfollow-up visit for management of coronary calcification/hypertension/hyperlipidemia.  Over the last 6 months patient states that he is doing well from a cardiovascular standpoint and denies anginal discomfort or heart failure symptoms.  Patient states that he is busy taking care of his wife which is labor-intensive and therefore not able to exercise on a regular basis.  At last office visit has started on losartan for better blood pressure management which is tolerated well without any side effects or intolerances.  He does have a history of DVT and PE and currently on Eliquis.  His anticoagulation is being managed by other providers and his care team.  ALLERGIES: Allergies  Allergen Reactions   Cortisone Other (See Comments)    CBG thrown out of control   Clotrimazole-Betamethasone Other (See Comments)     MEDICATION LIST PRIOR TO VISIT: Current Outpatient Medications on File Prior to Visit  Medication Sig Dispense Refill   apixaban (ELIQUIS) 5 MG TABS tablet Take 1 tablet (5 mg total) by mouth 2 (two) times daily.     atorvastatin (LIPITOR) 40 MG tablet TAKE 1 TABLET(40 MG) BY MOUTH AT BEDTIME 90 tablet 1   BD INSULIN SYRINGE  U/F 31G X 5/16" 0.5 ML MISC 2 (two) times daily.     dapagliflozin propanediol (FARXIGA) 10 MG TABS tablet Take 10 mg by mouth daily.     diclofenac Sodium (VOLTAREN) 1 % GEL Apply 2 g topically 4 (four) times daily. 100 g 1   glucose blood test strip Use to test blood sugar twice a day     insulin NPH-regular Human (NOVOLIN 70/30) (70-30) 100 UNIT/ML injection Inject 30 Units into the skin 2 (two) times daily with a meal.      Lancets (ONETOUCH DELICA PLUS [id number] MISC Use to test blood sugar twice daily     losartan (COZAAR) 25 MG tablet TAKE 1 TABLET(25 MG) BY MOUTH DAILY 90 tablet 0   metoprolol succinate (TOPROL-XL) 25 MG 24 hr tablet TAKE 1 TABLET(25 MG) BY MOUTH DAILY WITH OR IMMEDIATELY FOLLOWING A MEAL 90 tablet 0   OZEMPIC, 0.25 OR 0.5 MG/DOSE, 2 MG/1.5ML SOPN Inject 0.5 mg into the skin once a week.     tiZANidine (ZANAFLEX) 2 MG tablet Take 1 tablet (2 mg total) by mouth every 8 (eight) hours as needed for muscle spasms. 12 tablet 0   No current facility-administered medications on file prior to visit.    PAST MEDICAL HISTORY: Past Medical History:  Diagnosis Date   Arthritis    Carpal tunnel syndrome on both sides    Chest pain    Coronary artery calcification seen on CAT scan    DVT (deep venous thrombosis) (HCC)    GERD (gastroesophageal reflux disease)    Headache    Hypercholesteremia    Hyperlipidemia    Hypertension  Neck muscle spasm    PE (pulmonary embolism)    Shortness of breath dyspnea    PE     PAST SURGICAL HISTORY: Past Surgical History:  Procedure Laterality Date   BILATERAL CARPAL TUNNEL RELEASE Bilateral    CATARACT EXTRACTION W/PHACO Left 08/05/2006   Dr. Gershon Crane   CATARACT EXTRACTION W/PHACO Right 08/18/2006   Dr. Gershon Crane   COLONOSCOPY     COLONOSCOPY WITH PROPOFOL Left 09/20/2019   Procedure: COLONOSCOPY WITH PROPOFOL;  Surgeon: Ronnette Juniper, MD;  Location: [facility];  Service: Gastroenterology;  Laterality: Left;   COLONOSCOPY WITH  PROPOFOL N/A 10/03/2019   Procedure: COLONOSCOPY WITH PROPOFOL;  Surgeon: Wilford Corner, MD;  Location: [facility];  Service: Endoscopy;  Laterality: N/A;   ELBOW SURGERY     BONE SPURS REMOVED B ELBOWS   EYE SURGERY Bilateral    cataracts   FOOT SURGERY     HEEL SPURS REMOVED B HEELS   HEMOSTASIS CLIP PLACEMENT  09/20/2019   Procedure: HEMOSTASIS CLIP PLACEMENT;  Surgeon: Ronnette Juniper, MD;  Location: [facility];  Service: Gastroenterology;;   HEMOSTASIS CLIP PLACEMENT  10/03/2019   Procedure: HEMOSTASIS CLIP PLACEMENT;  Surgeon: Wilford Corner, MD;  Location: [facility];  Service: Endoscopy;;   HOT HEMOSTASIS N/A 09/20/2019   Procedure: HOT HEMOSTASIS (ARGON PLASMA COAGULATION/BICAP);  Surgeon: Ronnette Juniper, MD;  Location: [facility];  Service: Gastroenterology;  Laterality: N/A;   I & D EXTREMITY Right 09/18/2014   Procedure: IRRIGATION AND DEBRIDEMENT RIGHT THUMB, open fracture repair;  Surgeon: Linna Hoff, MD;  Location: [facility];  Service: Orthopedics;  Laterality: Right;   IR IVC FILTER PLMT / S&I /IMG GUID/MOD SED  10/03/2019   LEG SURGERY Left    left thigh table saw accident, "cut to the bone", I&D/suturing   SHOULDER SURGERY Left    Bone spur   SPINE SURGERY  09/04/1985   lumbar spine   SUBMUCOSAL INJECTION  10/03/2019   Procedure: SUBMUCOSAL INJECTION;  Surgeon: Wilford Corner, MD;  Location: [facility];  Service: Endoscopy;;   TOE SURGERY      FAMILY HISTORY: The patient's family history includes Cancer in his mother; Diabetes in his father.   SOCIAL HISTORY:  The patient  reports that he quit smoking about 59 years ago. His smoking use included cigarettes. He has a 6.50 pack-year smoking history. He has quit using smokeless tobacco. He reports that he does not drink alcohol and does not use drugs.  Review of Systems  Cardiovascular:  Negative for chest pain, claudication, dyspnea on exertion, leg swelling, near-syncope, orthopnea, palpitations, paroxysmal  nocturnal dyspnea and syncope.  Respiratory:  Negative for cough, shortness of breath and wheezing.     PHYSICAL EXAM:    05/31/2022    9:37 AM 11/28/2021   11:14 AM 11/28/2021   10:49 AM  Vitals with BMI  Height '5\' 8"'     Weight 188 lbs 13 oz    BMI 82.50    Systolic 037 048 889  Diastolic 62 86 96  Pulse 62 72 67   Physical Exam  Constitutional: No distress.  Age appropriate, hemodynamically stable.   Neck: No JVD present.  Cardiovascular: Normal rate, regular rhythm, S1 normal, S2 normal and intact distal pulses. Exam reveals no gallop, no S3 and no S4.  No murmur heard. Pulses:      Femoral pulses are 2+ on the right side and 2+ on the left side.      Dorsalis pedis pulses are 1+ on the right  side and 1+ on the left side.       Posterior tibial pulses are 1+ on the right side and 1+ on the left side.  Pulmonary/Chest: Effort normal and breath sounds normal. No stridor. He has no wheezes. He has no rales.  Abdominal: Soft. Bowel sounds are normal. He exhibits no distension. There is no abdominal tenderness.  Musculoskeletal:        General: No edema.     Cervical back: Neck supple.  Neurological: He is alert and oriented to person, place, and time. He has intact cranial nerves (2-12).  Skin: Skin is warm and moist.    RADIOLOGY: CT of abdomen 01/16/2018:  Aortic atherosclerosis, in addition to least 2 vessel coronary artery disease.  There are calcifications of the aortic valve.   CTA of chest 02/25/2019: Small pulmonary embolus in right lower lobe pulmonary arterial branches.   CARDIAC DATABASE: EKG: 05/31/2022: Normal sinus rhythm, 73 bpm, first-degree AV block, right bundle branch block, without underlying injury pattern.  Echocardiogram: 05/05/2019: LVEF 55%, mild LVH, grade 1 diastolic impairment, no significant valvular heart disease.  Stress Testing:  Lexiscan (Walking with mod Bruce)Tetrofosmin Stress Test  03/23/2020: Nondiagnostic ECG stress. Resting  EKG/ECG demonstrated normal sinus rhythm. Left QRS axis deviation in the presence of right bundle branch block. Non-specific ST-T abnormality. Resting ECG revealed premature ventricular contractions. PVC and V- Bigeminy persisted throughout the lexiscan stress with mod-Bruce protocol. No additional ST-T changes from baseline.  There was diaphragmatic attenuation in inferior wall, otherwise myocardial perfusion is normal. Overall LV systolic function is normal without regional wall motion abnormalities. There is a fixed mild defect in the inferior region.  Stress LV EF: 60%.  No previous exam available for comparison. Low risk.   Heart Catheterization: None  IVC/Iliac Study 10/14/2019: Abdominal Aorta: There is evidence of abnormal dilatation of the distal Abdominal aorta. IVC/Iliac: There is evidence of acute thrombus involving the IVC. There is evidence of acute thrombus involving the right common iliac vein, left common iliac vein, right external iliac vein, left external iliac vein. Visualization of proximal Inferior Vena Cava was limited.  US Venous duplex 10/14/2019: RIGHT:  - Findings consistent with acute deep vein thrombosis involving the right common femoral vein, SF junction, right femoral vein, right proximal  profunda vein, right popliteal vein, right posterior tibial veins, right peroneal veins, and right gastrocnemius veins.  - No cystic structure found in the popliteal fossa.     LEFT:  - Findings consistent with acute deep vein thrombosis involving the left common femoral vein, SF junction, left femoral vein, left proximal profunda vein, left popliteal vein, and left gastrocnemius veins.  - No cystic structure found in the popliteal fossa.   LABORATORY DATA: Comprehensive Metabolic Panel (CMP)   5537-48-27    A/G Ratio 1.6   1.1-2.5  Albumin 4.7   3.5-5.3  Alkaline Phosphatase 125   40-129  ALT (SGPT) 34   <5-55  AST (SGOT) 26   <5-46  Bilirubin, Total 0.5   <0.2-1.2   BUN 19   8-23  Calcium 9.3   8.6-10.4  Chloride 105   97-108  CO2 29   22-32  Creatinine 1.33   0.70-1.30  Estimated GFR (Black) 60   >59  Estimated GFR (Other) 52   >59  Glucose 159   65-99  Potassium 4.9   3.5-5.3  Protein 7.6   6.0-8.3  Sodium 142   135-145  Hemoglobin A1C   2021-06-27    Estimated Average  Glucose 186      Hemoglobin A1C 8.1   <5.7  Lipid Panel   2021-06-27    Cholesterol 123   <200  Cholesterol / HDL Ratio 1.92   0.00-4.99  HDL Cholesterol 64   >39  LDL Cholesterol (Calculation) 47   <130  LDL/HDL Ratio 0.7   <3.3  Non-HDL Cholesterol 59   <130  Triglycerides 61   <150  TSH   2021-06-27    TSH 2.35   0.27-4.20    IMPRESSION:    ICD-10-CM   1. Coronary artery calcification seen on CT scan  I25.10 EKG 12-Lead    PCV ECHOCARDIOGRAM COMPLETE    Lipid Panel With LDL/HDL Ratio    LDL cholesterol, direct    CMP14+EGFR    2. Essential hypertension  I10     3. Hypercholesterolemia  E78.00 Lipid Panel With LDL/HDL Ratio    LDL cholesterol, direct    CMP14+EGFR    4. Type 2 diabetes mellitus with other ophthalmic complication, with long-term current use of insulin (HCC)  E11.39    Z79.4     5. Long-term insulin use (HCC)  Z79.4     6. Hx pulmonary embolism  Z86.711     7. Chronic deep vein thrombosis (DVT) of proximal vein of both lower extremities (HCC)  I82.5Y3     8. Presence of IVC filter  Z95.828     9. RBBB  I45.10        RECOMMENDATIONS: Massey Ruhland is a 76 y.o. male whose past medical history and cardiovascular risk factors include: Hypertension, type 2 diabetes mellitus with complications, hyperlipidemia, former smoker, history of DVT/PE and status post IVC filter on oral anticoagulation, coronary artery calcification by CT scan, advanced age.  Coronary artery calcification seen on CT scan Currently not on aspirin as he is on Eliquis given his history of DVT/PE this will help minimize his risk of bleeding. Continue statin  therapy. We will recheck fasting lipid profile. We will check an echocardiogram prior to the next office visit as a 3-year follow-up study.  Essential hypertension Office blood pressures are very well controlled. Medications reconciled. No changes warranted.  Hypercholesterolemia Currently on atorvastatin.   He denies myalgia or other side effects. No recent labs available for review. We will check fasting lipid profile.  Type 2 diabetes mellitus with other ophthalmic complication, with long-term current use of insulin (HCC) Currently on insulin therapy, ARB, statin therapy. We emphasized importance of glycemic control. Managed by his other providers within the care team.   FINAL MEDICATION LIST END OF ENCOUNTER: No orders of the defined types were placed in this encounter.   Medications Discontinued During This Encounter  Medication Reason   acetaminophen (TYLENOL) 650 MG CR tablet    Betamethasone Diprop-Minoxidil 0.05-5 % SOLN    clindamycin (CLEOCIN T) 1 % external solution    vitamin B-12 (CYANOCOBALAMIN) 500 MCG tablet    Vitamin D, Ergocalciferol, (DRISDOL) 1.25 MG (50000 UNIT) CAPS capsule      Current Outpatient Medications:    apixaban (ELIQUIS) 5 MG TABS tablet, Take 1 tablet (5 mg total) by mouth 2 (two) times daily., Disp: , Rfl:    atorvastatin (LIPITOR) 40 MG tablet, TAKE 1 TABLET(40 MG) BY MOUTH AT BEDTIME, Disp: 90 tablet, Rfl: 1   BD INSULIN SYRINGE U/F 31G X 5/16" 0.5 ML MISC, 2 (two) times daily., Disp: , Rfl:    dapagliflozin propanediol (FARXIGA) 10 MG TABS tablet, Take 10 mg by mouth daily., Disp: ,  Rfl:    diclofenac Sodium (VOLTAREN) 1 % GEL, Apply 2 g topically 4 (four) times daily., Disp: 100 g, Rfl: 1   glucose blood test strip, Use to test blood sugar twice a day, Disp: , Rfl:    insulin NPH-regular Human (NOVOLIN 70/30) (70-30) 100 UNIT/ML injection, Inject 30 Units into the skin 2 (two) times daily with a meal. , Disp: , Rfl:    Lancets  (ONETOUCH DELICA PLUS OUMNAR22O) MISC, Use to test blood sugar twice daily, Disp: , Rfl:    losartan (COZAAR) 25 MG tablet, TAKE 1 TABLET(25 MG) BY MOUTH DAILY, Disp: 90 tablet, Rfl: 0   metoprolol succinate (TOPROL-XL) 25 MG 24 hr tablet, TAKE 1 TABLET(25 MG) BY MOUTH DAILY WITH OR IMMEDIATELY FOLLOWING A MEAL, Disp: 90 tablet, Rfl: 0   OZEMPIC, 0.25 OR 0.5 MG/DOSE, 2 MG/1.5ML SOPN, Inject 0.5 mg into the skin once a week., Disp: , Rfl:    tiZANidine (ZANAFLEX) 2 MG tablet, Take 1 tablet (2 mg total) by mouth every 8 (eight) hours as needed for muscle spasms., Disp: 12 tablet, Rfl: 0  Orders Placed This Encounter  Procedures   Lipid Panel With LDL/HDL Ratio   LDL cholesterol, direct   CMP14+EGFR   EKG 12-Lead   PCV ECHOCARDIOGRAM COMPLETE   --Continue cardiac medications as reconciled in final medication list. --Return in about 6 months (around 11/29/2022) for Follow up HTN, HLD, and CAC. Or sooner if needed. --Continue follow-up with your primary care physician regarding the management of your other chronic comorbid conditions.  Patient's questions and concerns were addressed to his satisfaction. He voices understanding of the instructions provided during this encounter.   This note was created using a voice recognition software as a result there may be grammatical errors inadvertently enclosed that do not reflect the nature of this encounter. Every attempt is made to correct such errors.  Total time spent: 30 minutes  [facility]  Pager: [phone] Office: [phone]

## 2022-06-08 ENCOUNTER — Other Ambulatory Visit: Payer: Self-pay | Admitting: Cardiology

## 2022-06-08 DIAGNOSIS — E1139 Type 2 diabetes mellitus with other diabetic ophthalmic complication: Secondary | ICD-10-CM

## 2022-06-08 DIAGNOSIS — I10 Essential (primary) hypertension: Secondary | ICD-10-CM

## 2022-06-26 ENCOUNTER — Other Ambulatory Visit: Payer: Self-pay | Admitting: Cardiology

## 2022-06-26 DIAGNOSIS — E7800 Pure hypercholesterolemia, unspecified: Secondary | ICD-10-CM

## 2022-07-22 ENCOUNTER — Other Ambulatory Visit: Payer: Self-pay | Admitting: Cardiology

## 2022-07-22 DIAGNOSIS — I10 Essential (primary) hypertension: Secondary | ICD-10-CM

## 2022-09-19 ENCOUNTER — Other Ambulatory Visit: Payer: Self-pay | Admitting: Cardiology

## 2022-09-19 DIAGNOSIS — E1139 Type 2 diabetes mellitus with other diabetic ophthalmic complication: Secondary | ICD-10-CM

## 2022-09-19 DIAGNOSIS — I10 Essential (primary) hypertension: Secondary | ICD-10-CM

## 2022-10-30 ENCOUNTER — Ambulatory Visit (INDEPENDENT_AMBULATORY_CARE_PROVIDER_SITE_OTHER): Payer: PPO | Admitting: Podiatry

## 2022-10-30 ENCOUNTER — Encounter: Payer: Self-pay | Admitting: Podiatry

## 2022-10-30 DIAGNOSIS — E1142 Type 2 diabetes mellitus with diabetic polyneuropathy: Secondary | ICD-10-CM

## 2022-10-30 DIAGNOSIS — M79674 Pain in right toe(s): Secondary | ICD-10-CM

## 2022-10-30 DIAGNOSIS — B351 Tinea unguium: Secondary | ICD-10-CM

## 2022-10-30 DIAGNOSIS — M79675 Pain in left toe(s): Secondary | ICD-10-CM | POA: Diagnosis not present

## 2022-10-30 NOTE — Progress Notes (Signed)
  Subjective:  Patient ID: Matthew Zimmerman, male    DOB: Jan 13, 1946,   MRN: [id number]  Chief Complaint  Patient presents with   Nail Problem     Routine foot care    77 y.o. male presents for concern of thickened elongated and painful nails that are difficult to trim. Requesting to have them trimmed today. Relates burning and tingling in their feet. Patient is diabetic and last A1c was  Lab Results  Component Value Date   HGBA1C 8.5 (H) 08/05/2020   .   PCP:  Janie Morning, DO    . Denies any other pedal complaints. Denies n/v/f/c.   Past Medical History:  Diagnosis Date   Arthritis    Carpal tunnel syndrome on both sides    Chest pain    Coronary artery calcification seen on CAT scan    DVT (deep venous thrombosis) (HCC)    GERD (gastroesophageal reflux disease)    Headache    Hypercholesteremia    Hyperlipidemia    Hypertension    Neck muscle spasm    PE (pulmonary embolism)    Shortness of breath dyspnea    PE     Objective:  Physical Exam: Vascular: DP/PT pulses 2/4 bilateral. CFT <3 seconds. Absent hair growth on digits. Edema noted to bilateral lower extremities. Xerosis noted bilaterally.  Skin. No lacerations or abrasions bilateral feet. Nails 1-5 bilateral  are thickened discolored and elongated with subungual debris.  Musculoskeletal: MMT 5/5 bilateral lower extremities in DF, PF, Inversion and Eversion. Deceased ROM in DF of ankle joint.  Neurological: Sensation intact to light touch. Protective sensation diminished bilateral.    Assessment:   1. Type 2 diabetes mellitus with diabetic polyneuropathy, unspecified whether long term insulin use ([place])      Plan:  Patient was evaluated and treated and all questions answered. -Discussed and educated patient on diabetic foot care, especially with  regards to the vascular, neurological and musculoskeletal systems.  -Stressed the importance of good glycemic control and the detriment of not  controlling  glucose levels in relation to the foot. -Discussed supportive shoes at all times and checking feet regularly.  -Mechanically debrided all nails 1-5 bilateral using sterile nail nipper and filed with dremel without incident  -Answered all patient questions -Patient to return  in 3 months for at risk foot care -Patient advised to call the office if any problems or questions arise in the meantime.   Lorenda Peck, DPM

## 2022-11-13 ENCOUNTER — Ambulatory Visit: Payer: PPO

## 2022-11-13 DIAGNOSIS — I2510 Atherosclerotic heart disease of native coronary artery without angina pectoris: Secondary | ICD-10-CM

## 2022-11-24 ENCOUNTER — Encounter: Payer: Self-pay | Admitting: Cardiology

## 2022-11-24 ENCOUNTER — Ambulatory Visit: Payer: HMO | Admitting: Cardiology

## 2022-11-24 VITALS — BP 130/74 | HR 69 | Resp 17 | Ht 68.0 in | Wt 207.8 lb

## 2022-11-24 DIAGNOSIS — Z86711 Personal history of pulmonary embolism: Secondary | ICD-10-CM

## 2022-11-24 DIAGNOSIS — Z95828 Presence of other vascular implants and grafts: Secondary | ICD-10-CM

## 2022-11-24 DIAGNOSIS — I10 Essential (primary) hypertension: Secondary | ICD-10-CM

## 2022-11-24 DIAGNOSIS — I825Y3 Chronic embolism and thrombosis of unspecified deep veins of proximal lower extremity, bilateral: Secondary | ICD-10-CM

## 2022-11-24 DIAGNOSIS — M7989 Other specified soft tissue disorders: Secondary | ICD-10-CM

## 2022-11-24 DIAGNOSIS — E1139 Type 2 diabetes mellitus with other diabetic ophthalmic complication: Secondary | ICD-10-CM

## 2022-11-24 DIAGNOSIS — I4510 Unspecified right bundle-branch block: Secondary | ICD-10-CM

## 2022-11-24 DIAGNOSIS — I2510 Atherosclerotic heart disease of native coronary artery without angina pectoris: Secondary | ICD-10-CM

## 2022-11-24 DIAGNOSIS — E7800 Pure hypercholesterolemia, unspecified: Secondary | ICD-10-CM

## 2022-11-24 DIAGNOSIS — Z794 Long term (current) use of insulin: Secondary | ICD-10-CM

## 2022-11-24 LAB — CMP14+EGFR
ALT: 28 IU/L (ref 0–44)
AST: 21 IU/L (ref 0–40)
Albumin/Globulin Ratio: 1.5 (ref 1.2–2.2)
Albumin: 4.4 g/dL (ref 3.8–4.8)
Alkaline Phosphatase: 124 IU/L — ABNORMAL HIGH (ref 44–121)
BUN/Creatinine Ratio: 14 (ref 10–24)
BUN: 20 mg/dL (ref 8–27)
Bilirubin Total: 0.7 mg/dL (ref 0.0–1.2)
CO2: 26 mmol/L (ref 20–29)
Calcium: 9.8 mg/dL (ref 8.6–10.2)
Chloride: 101 mmol/L (ref 96–106)
Creatinine, Ser: 1.42 mg/dL — ABNORMAL HIGH (ref 0.76–1.27)
Globulin, Total: 3 g/dL (ref 1.5–4.5)
Glucose: 197 mg/dL — ABNORMAL HIGH (ref 70–99)
Potassium: 4.5 mmol/L (ref 3.5–5.2)
Sodium: 140 mmol/L (ref 134–144)
Total Protein: 7.4 g/dL (ref 6.0–8.5)
eGFR: 51 mL/min/{1.73_m2} — ABNORMAL LOW (ref >59)

## 2022-11-24 LAB — LIPID PANEL WITH LDL/HDL RATIO
Cholesterol, Total: 125 mg/dL (ref 100–199)
HDL: 63 mg/dL (ref >39)
LDL Chol Calc (NIH): 50 mg/dL (ref 0–99)
LDL/HDL Ratio: 0.8 ratio (ref 0.0–3.6)
Triglycerides: 54 mg/dL (ref 0–149)
VLDL Cholesterol Cal: 12 mg/dL (ref 5–40)

## 2022-11-24 LAB — LDL CHOLESTEROL, DIRECT: LDL Direct: 52 mg/dL (ref 0–99)

## 2022-11-24 NOTE — Progress Notes (Signed)
Matthew Zimmerman Date of Birth: 06-26-1946 MRN: [id number] Primary Care Provider:Collins, Hinton Dyer, DO Former Cardiology Providers: Jeri Lager, APRN, FNP-C Primary Cardiologist: Rex Kras, DO, [facility] (established care 03/09/2020)   Date: 11/24/22 Last Office Visit: 05/31/2022  Chief Complaint  Patient presents with   CAC   Follow-up    6 months    HPI  Matthew Zimmerman is a 77 y.o.  male whose past medical history and cardiovascular risk factors include: Hypertension, type 2 diabetes mellitus with complications, hyperlipidemia, former smoker, history of DVT/PE and status post IVC filter on oral anticoagulation, coronary artery calcification by CT scan, advanced age.  Patient presents today for 38-month follow-up visit.  He denies anginal chest pain or heart failure symptoms.  He does have lower extremity swelling right greater than left which is new according to him.  The swelling at times does improve when he wakes up but not always.  He has been compliant with anticoagulation.  However he does have a history of DVT and PE status post IVC filter.  Patient does not recall if he follows up with vascular surgery and or who has been filling his anticoagulation (in the past other providers from his care team are managing it).  Recent labs from 11/23/2022 independently reviewed.  ALLERGIES: Allergies  Allergen Reactions   Cortisone Other (See Comments)    CBG thrown out of control   Clotrimazole-Betamethasone Other (See Comments)     MEDICATION LIST PRIOR TO VISIT: Current Outpatient Medications on File Prior to Visit  Medication Sig Dispense Refill   apixaban (ELIQUIS) 5 MG TABS tablet Take 1 tablet (5 mg total) by mouth 2 (two) times daily.     atorvastatin (LIPITOR) 40 MG tablet TAKE 1 TABLET(40 MG) BY MOUTH AT BEDTIME 90 tablet 1   BD INSULIN SYRINGE U/F 31G X 5/16" 0.5 ML MISC 2 (two) times daily.     diclofenac Sodium (VOLTAREN) 1 % GEL Apply 2 g topically 4 (four) times  daily. 100 g 1   glucose blood test strip Use to test blood sugar twice a day     insulin NPH-regular Human (NOVOLIN 70/30) (70-30) 100 UNIT/ML injection Inject 30 Units into the skin 2 (two) times daily with a meal.      Lancets (ONETOUCH DELICA PLUS 123XX123) MISC Use to test blood sugar twice daily     losartan (COZAAR) 25 MG tablet TAKE 1 TABLET(25 MG) BY MOUTH DAILY 90 tablet 0   OZEMPIC, 0.25 OR 0.5 MG/DOSE, 2 MG/1.5ML SOPN Inject 0.5 mg into the skin once a week.     tiZANidine (ZANAFLEX) 2 MG tablet Take 1 tablet (2 mg total) by mouth every 8 (eight) hours as needed for muscle spasms. 12 tablet 0   dapagliflozin propanediol (FARXIGA) 10 MG TABS tablet Take 10 mg by mouth daily.     metoprolol succinate (TOPROL-XL) 25 MG 24 hr tablet TAKE 1 TABLET(25 MG) BY MOUTH DAILY WITH OR IMMEDIATELY FOLLOWING A MEAL 90 tablet 1   No current facility-administered medications on file prior to visit.    PAST MEDICAL HISTORY: Past Medical History:  Diagnosis Date   Arthritis    Carpal tunnel syndrome on both sides    Chest pain    Coronary artery calcification seen on CAT scan    DVT (deep venous thrombosis) (HCC)    GERD (gastroesophageal reflux disease)    Headache    Hypercholesteremia    Hyperlipidemia    Hypertension    Neck muscle spasm  PE (pulmonary embolism)    Shortness of breath dyspnea    PE     PAST SURGICAL HISTORY: Past Surgical History:  Procedure Laterality Date   BILATERAL CARPAL TUNNEL RELEASE Bilateral    CATARACT EXTRACTION W/PHACO Left 08/05/2006   Dr. Gershon Crane   CATARACT EXTRACTION W/PHACO Right 08/18/2006   Dr. Gershon Crane   COLONOSCOPY     COLONOSCOPY WITH PROPOFOL Left 09/20/2019   Procedure: COLONOSCOPY WITH PROPOFOL;  Surgeon: Ronnette Juniper, MD;  Location: [facility];  Service: Gastroenterology;  Laterality: Left;   COLONOSCOPY WITH PROPOFOL N/A 10/03/2019   Procedure: COLONOSCOPY WITH PROPOFOL;  Surgeon: Wilford Corner, MD;  Location: [facility];   Service: Endoscopy;  Laterality: N/A;   ELBOW SURGERY     BONE SPURS REMOVED B ELBOWS   EYE SURGERY Bilateral    cataracts   FOOT SURGERY     HEEL SPURS REMOVED B HEELS   HEMOSTASIS CLIP PLACEMENT  09/20/2019   Procedure: HEMOSTASIS CLIP PLACEMENT;  Surgeon: Ronnette Juniper, MD;  Location: [facility];  Service: Gastroenterology;;   HEMOSTASIS CLIP PLACEMENT  10/03/2019   Procedure: HEMOSTASIS CLIP PLACEMENT;  Surgeon: Wilford Corner, MD;  Location: [facility];  Service: Endoscopy;;   HOT HEMOSTASIS N/A 09/20/2019   Procedure: HOT HEMOSTASIS (ARGON PLASMA COAGULATION/BICAP);  Surgeon: Ronnette Juniper, MD;  Location: [facility];  Service: Gastroenterology;  Laterality: N/A;   I & D EXTREMITY Right 09/18/2014   Procedure: IRRIGATION AND DEBRIDEMENT RIGHT THUMB, open fracture repair;  Surgeon: Linna Hoff, MD;  Location: [facility];  Service: Orthopedics;  Laterality: Right;   IR IVC FILTER PLMT / S&I /IMG GUID/MOD SED  10/03/2019   LEG SURGERY Left    left thigh table saw accident, "cut to the bone", I&D/suturing   SHOULDER SURGERY Left    Bone spur   SPINE SURGERY  09/04/1985   lumbar spine   SUBMUCOSAL INJECTION  10/03/2019   Procedure: SUBMUCOSAL INJECTION;  Surgeon: Wilford Corner, MD;  Location: [facility];  Service: Endoscopy;;   TOE SURGERY      FAMILY HISTORY: The patient's family history includes Cancer in his mother; Diabetes in his father.   SOCIAL HISTORY:  The patient  reports that he quit smoking about 60 years ago. His smoking use included cigarettes. He has a 6.50 pack-year smoking history. He has quit using smokeless tobacco. He reports that he does not drink alcohol and does not use drugs.  Review of Systems  Cardiovascular:  Positive for leg swelling. Negative for chest pain, claudication, dyspnea on exertion, near-syncope, orthopnea, palpitations, paroxysmal nocturnal dyspnea and syncope.  Respiratory:  Negative for cough, shortness of breath and wheezing.      PHYSICAL EXAM:    11/24/2022   11:15 AM 05/31/2022    9:37 AM 11/28/2021   11:14 AM  Vitals with BMI  Height 5\' 8"  5\' 8"    Weight 207 lbs 13 oz 188 lbs 13 oz   BMI [id number]   Systolic [id number]  Diastolic 74 62 86  Pulse 69 62 72   Physical Exam  Constitutional: No distress.  Age appropriate, hemodynamically stable.   Neck: No JVD present.  Cardiovascular: Normal rate, regular rhythm, S1 normal, S2 normal and intact distal pulses. Exam reveals no gallop, no S3 and no S4.  No murmur heard. Pulses:      Femoral pulses are 2+ on the right side and 2+ on the left side.      Dorsalis pedis pulses are 1+ on the right  side and 1+ on the left side.       Posterior tibial pulses are 1+ on the right side and 1+ on the left side.  Pulmonary/Chest: Effort normal and breath sounds normal. No stridor. He has no wheezes. He has no rales.  Abdominal: Soft. Bowel sounds are normal. He exhibits no distension. There is no abdominal tenderness.  Musculoskeletal:        General: Edema (RLE>LLE) present.     Cervical back: Neck supple.  Neurological: He is alert and oriented to person, place, and time. He has intact cranial nerves (2-12).  Skin: Skin is warm and moist.    RADIOLOGY: CT of abdomen 01/16/2018:  Aortic atherosclerosis, in addition to least 2 vessel coronary artery disease.  There are calcifications of the aortic valve.   CTA of chest 02/25/2019: Small pulmonary embolus in right lower lobe pulmonary arterial branches.   CARDIAC DATABASE: EKG: 05/31/2022: Normal sinus rhythm, 73 bpm, first-degree AV block, right bundle branch block, without underlying injury pattern.  Echocardiogram: 05/05/2019: LVEF 55%, mild LVH, grade 1 diastolic impairment, no significant valvular heart disease.  11/13/2022: Normal LV systolic function with visual EF 55-60%. Left ventricle cavity is normal in size. Normal left ventricular wall thickness with presence of a septal bulge. Normal global  wall motion. Normal diastolic filling pattern, normal LAP.  Aortic valve sclerosis without stenosis. Mild pulmonic regurgitation. Compared to 05/05/2019 LVEF remain stable and G1DD is now normal otherwise no significant change.   Stress Testing:  Lexiscan (Walking with mod Bruce)Tetrofosmin Stress Test  03/23/2020: Nondiagnostic ECG stress. Resting EKG/ECG demonstrated normal sinus rhythm. Left QRS axis deviation in the presence of right bundle branch block. Non-specific ST-T abnormality. Resting ECG revealed premature ventricular contractions. PVC and V- Bigeminy persisted throughout the lexiscan stress with mod-Bruce protocol. No additional ST-T changes from baseline.  There was diaphragmatic attenuation in inferior wall, otherwise myocardial perfusion is normal. Overall LV systolic function is normal without regional wall motion abnormalities. There is a fixed mild defect in the inferior region.  Stress LV EF: 60%.  No previous exam available for comparison. Low risk.   Heart Catheterization: None  IVC/Iliac Study 10/14/2019: Abdominal Aorta: There is evidence of abnormal dilatation of the distal Abdominal aorta. IVC/Iliac: There is evidence of acute thrombus involving the IVC. There is evidence of acute thrombus involving the right common iliac vein, left common iliac vein, right external iliac vein, left external iliac vein. Visualization of proximal Inferior Vena Cava was limited.  US Venous duplex 10/14/2019: RIGHT:  - Findings consistent with acute deep vein thrombosis involving the right common femoral vein, SF junction, right femoral vein, right proximal  profunda vein, right popliteal vein, right posterior tibial veins, right peroneal veins, and right gastrocnemius veins.  - No cystic structure found in the popliteal fossa.     LEFT:  - Findings consistent with acute deep vein thrombosis involving the left common femoral vein, SF junction, left femoral vein, left proximal  profunda vein, left popliteal vein, and left gastrocnemius veins.  - No cystic structure found in the popliteal fossa.   LABORATORY DATA: Comprehensive Metabolic Panel (CMP)   [id number]    A/G Ratio 1.6   1.1-2.5  Albumin 4.7   3.5-5.3  Alkaline Phosphatase 125   40-129  ALT (SGPT) 34   <5-55  AST (SGOT) 26   <5-46  Bilirubin, Total 0.5   <0.2-1.2  BUN 19   8-23  Calcium 9.3   8.6-10.4  Chloride 105  97-108  CO2 29   22-32  Creatinine 1.33   0.70-1.30  Estimated GFR (Black) 60   >59  Estimated GFR (Other) 52   >59  Glucose 159   65-99  Potassium 4.9   3.5-5.3  Protein 7.6   6.0-8.3  Sodium 142   135-145  Hemoglobin A1C   2021-06-27    Estimated Average Glucose 186      Hemoglobin A1C 8.1   <5.7  Lipid Panel   2021-06-27    Cholesterol 123   <200  Cholesterol / HDL Ratio 1.92   0.00-4.99  HDL Cholesterol 64   >39  LDL Cholesterol (Calculation) 47   <130  LDL/HDL Ratio 0.7   <3.3  Non-HDL Cholesterol 59   <130  Triglycerides 61   <150  TSH   2021-06-27    TSH 2.35   0.27-4.20   CMP     Latest Ref Rng & Units 11/23/2022   11:31 AM 04/29/2021    8:52 AM 08/06/2020    5:12 AM  CMP  Glucose 70 - 99 mg/dL 197  185  139   BUN 8 - 27 mg/dL 20  20  15    Creatinine 0.76 - 1.27 mg/dL 1.42  1.49  1.19   Sodium 134 - 144 mmol/L 140  140  140   Potassium 3.5 - 5.2 mmol/L 4.5  5.2  3.8   Chloride 96 - 106 mmol/L 101  105  109   CO2 20 - 29 mmol/L 26  21  23    Calcium 8.6 - 10.2 mg/dL 9.8  9.6  8.5   Total Protein 6.0 - 8.5 g/dL 7.4  7.2  6.6   Total Bilirubin 0.0 - 1.2 mg/dL 0.7  0.5  1.0   Alkaline Phos 44 - 121 IU/L 124  112  73   AST 0 - 40 IU/L 21  24  19    ALT 0 - 44 IU/L 28  36  23    Lab Results  Component Value Date   CHOL 125 11/23/2022   HDL 63 11/23/2022   LDLCALC 50 11/23/2022   LDLDIRECT 52 11/23/2022   TRIG 54 11/23/2022     IMPRESSION:    ICD-10-CM   1. Coronary artery calcification seen on CT scan  I25.10 EKG 12-Lead    2. Essential hypertension   I10     3. Hypercholesterolemia  E78.00     4. Type 2 diabetes mellitus with other ophthalmic complication, with long-term current use of insulin (HCC)  E11.39    Z79.4     5. Long-term insulin use (HCC)  Z79.4     6. Hx pulmonary embolism  Z86.711     7. Leg swelling  M79.89 VAS [name] LOWER EXTREMITY VENOUS (DVT)    8. Chronic deep vein thrombosis (DVT) of proximal vein of both lower extremities (HCC)  I82.5Y3 VAS [name] LOWER EXTREMITY VENOUS (DVT)    9. Presence of IVC filter  Z95.828     10. RBBB  I45.10 EKG 12-Lead       RECOMMENDATIONS: Troyce Swee is a 77 y.o. male whose past medical history and cardiovascular risk factors include: Hypertension, type 2 diabetes mellitus with complications, hyperlipidemia, former smoker, history of DVT/PE and status post IVC filter on oral anticoagulation, coronary artery calcification by CT scan, advanced age.  Coronary artery calcification seen on CT scan Noted on nongated CT study. Currently not on aspirin as he is currently on Eliquis given his history of DVT/PE/IVC filter. Most  recent lipid profile reviewed, LDL within acceptable limits. Echocardiogram from last office visit notes preserved LVEF, normal diastolic function, no significant valvular heart disease. Reemphasized importance of improving his modifiable cardiovascular risk factors. Encourage increasing physical activity 30 minutes a day 5 days a week as tolerated  Essential hypertension Office and home blood pressures are well-controlled. Medications reconciled. Labs reviewed independently at today's visit  Hypercholesterolemia Currently on atorvastatin.   He denies myalgia or other side effects. Most recent lipids dated March 2024, independently reviewed as noted above. Current LDL <55 mg/dL.  Type 2 diabetes mellitus with other ophthalmic complication, with long-term current use of insulin ([place]) Reemphasized importance of glycemic control. Currently on insulin  therapy, ARB, statin therapy Currently managed by primary care provider.  Leg swelling Chronic deep vein thrombosis (DVT) of proximal vein of both lower extremities (HCC) Presence of IVC filter Right lower extremity swollen compared to the left. At times swelling does improves by morning suggestive of possible venous insufficiency.  Patient endorses compliance with anticoagulation.  Given his history of DVT/PE will repeat lower extremity venous duplex to rule out DVT Anticoagulation is currently being managed by other providers in the care team.  Given his history of IVC filter may benefit from reestablishing care with vascular surgery-patient will discuss with PCP.  FINAL MEDICATION LIST END OF ENCOUNTER: No orders of the defined types were placed in this encounter.   There are no discontinued medications.    Current Outpatient Medications:    apixaban (ELIQUIS) 5 MG TABS tablet, Take 1 tablet (5 mg total) by mouth 2 (two) times daily., Disp: , Rfl:    atorvastatin (LIPITOR) 40 MG tablet, TAKE 1 TABLET(40 MG) BY MOUTH AT BEDTIME, Disp: 90 tablet, Rfl: 1   BD INSULIN SYRINGE U/F 31G X 5/16" 0.5 ML MISC, 2 (two) times daily., Disp: , Rfl:    diclofenac Sodium (VOLTAREN) 1 % GEL, Apply 2 g topically 4 (four) times daily., Disp: 100 g, Rfl: 1   glucose blood test strip, Use to test blood sugar twice a day, Disp: , Rfl:    insulin NPH-regular Human (NOVOLIN 70/30) (70-30) 100 UNIT/ML injection, Inject 30 Units into the skin 2 (two) times daily with a meal. , Disp: , Rfl:    Lancets (ONETOUCH DELICA PLUS 123XX123) MISC, Use to test blood sugar twice daily, Disp: , Rfl:    losartan (COZAAR) 25 MG tablet, TAKE 1 TABLET(25 MG) BY MOUTH DAILY, Disp: 90 tablet, Rfl: 0   OZEMPIC, 0.25 OR 0.5 MG/DOSE, 2 MG/1.5ML SOPN, Inject 0.5 mg into the skin once a week., Disp: , Rfl:    tiZANidine (ZANAFLEX) 2 MG tablet, Take 1 tablet (2 mg total) by mouth every 8 (eight) hours as needed for muscle spasms.,  Disp: 12 tablet, Rfl: 0   dapagliflozin propanediol (FARXIGA) 10 MG TABS tablet, Take 10 mg by mouth daily., Disp: , Rfl:    metoprolol succinate (TOPROL-XL) 25 MG 24 hr tablet, TAKE 1 TABLET(25 MG) BY MOUTH DAILY WITH OR IMMEDIATELY FOLLOWING A MEAL, Disp: 90 tablet, Rfl: 1  Orders Placed This Encounter  Procedures   EKG 12-Lead   VAS [name] LOWER EXTREMITY VENOUS (DVT)   --Continue cardiac medications as reconciled in final medication list. --Return in about 6 months (around 05/27/2023) for Follow up, Coronary artery calcification. Or sooner if needed. --Continue follow-up with your primary care physician regarding the management of your other chronic comorbid conditions.  Patient's questions and concerns were addressed to his satisfaction. He voices understanding  of the instructions provided during this encounter.   This note was created using a voice recognition software as a result there may be grammatical errors inadvertently enclosed that do not reflect the nature of this encounter. Every attempt is made to correct such errors.  Discussed management of his 2 chronic comorbid conditions, independently reviewed labs from 11/23/2022, independently reviewed the recent echocardiogram since last visit, discussed management of lower extremity swelling and additional diagnostic testing ordered.  Rex Kras, [place], [facility]  Pager:  [phone] Office: [phone]

## 2022-11-27 ENCOUNTER — Ambulatory Visit (HOSPITAL_COMMUNITY)
Admission: RE | Admit: 2022-11-27 | Discharge: 2022-11-27 | Disposition: A | Discharge status: Home / Self Care | Payer: PPO | Source: Ambulatory Visit | Attending: Surgery | Admitting: Surgery

## 2022-11-27 DIAGNOSIS — M7989 Other specified soft tissue disorders: Secondary | ICD-10-CM | POA: Diagnosis present

## 2022-11-27 DIAGNOSIS — I825Y3 Chronic embolism and thrombosis of unspecified deep veins of proximal lower extremity, bilateral: Secondary | ICD-10-CM | POA: Insufficient documentation

## 2022-11-28 ENCOUNTER — Ambulatory Visit (HOSPITAL_COMMUNITY): Payer: HMO

## 2022-11-28 NOTE — Progress Notes (Signed)
Faxing the results to PCP. Is this all the information you want me to tell the patient as well?

## 2022-12-12 ENCOUNTER — Other Ambulatory Visit: Payer: Self-pay | Admitting: *Deleted

## 2022-12-12 DIAGNOSIS — I82403 Acute embolism and thrombosis of unspecified deep veins of lower extremity, bilateral: Secondary | ICD-10-CM

## 2022-12-18 ENCOUNTER — Ambulatory Visit (HOSPITAL_COMMUNITY)
Admission: RE | Admit: 2022-12-18 | Discharge: 2022-12-18 | Disposition: A | Discharge status: Home / Self Care | Payer: PPO | Source: Ambulatory Visit | Attending: Surgery | Admitting: Surgery

## 2022-12-18 DIAGNOSIS — I82403 Acute embolism and thrombosis of unspecified deep veins of lower extremity, bilateral: Secondary | ICD-10-CM | POA: Diagnosis present

## 2022-12-18 LAB — VAS US ABI WITH/WO TBI

## 2022-12-26 ENCOUNTER — Encounter: Payer: Self-pay | Admitting: Vascular Surgery

## 2022-12-26 ENCOUNTER — Ambulatory Visit (INDEPENDENT_AMBULATORY_CARE_PROVIDER_SITE_OTHER): Payer: PPO | Admitting: Vascular Surgery

## 2022-12-26 VITALS — BP 156/82 | HR 59 | Temp 97.7°F | Resp 16 | Ht 68.5 in | Wt 207.0 lb

## 2022-12-26 DIAGNOSIS — I825Y3 Chronic embolism and thrombosis of unspecified deep veins of proximal lower extremity, bilateral: Secondary | ICD-10-CM

## 2022-12-26 DIAGNOSIS — I82509 Chronic embolism and thrombosis of unspecified deep veins of unspecified lower extremity: Secondary | ICD-10-CM | POA: Insufficient documentation

## 2022-12-26 NOTE — Progress Notes (Signed)
Patient name: Matthew Zimmerman MRN: [id number] DOB: 1946/03/20 Sex: male  REASON FOR CONSULT: DVT bilateral lower extremities with pain  HPI: Matthew Zimmerman is a 77 y.o. male, with history of hypertension, hyperlipidemia, DVT/PE that presents for evaluation of bilateral lower extremity pain and swelling.  He states his lower extremity DVTs were initially diagnosed around 2014- 2015.  He was placed on warfarin and then transitioned to Eliquis.  He then had recurrent DVTs in 2021 with subsequent GI bleed requiring IVC filter placement.  IVC filter was placed in January 2021 by IR.  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  Was previously seen by Dr. Arbie Cookey on 12/30/2019 and he was instructed to elevate his legs, do compression stockings, and take his anticoagulation.  His legs swell every day now.  Cannot get in his compression stockings.  ABI's on 12/18/22 were [place] with triphasic waveforms.    Past Medical History:  Diagnosis Date   Arthritis    Carpal tunnel syndrome on both sides    Chest pain    Coronary artery calcification seen on CAT scan    DVT (deep venous thrombosis)    GERD (gastroesophageal reflux disease)    Headache    Hypercholesteremia    Hyperlipidemia    Hypertension    Neck muscle spasm    PE (pulmonary embolism)    Shortness of breath dyspnea    PE     Past Surgical History:  Procedure Laterality Date   BILATERAL CARPAL TUNNEL RELEASE Bilateral    CATARACT EXTRACTION W/PHACO Left 08/05/2006   Dr. Nile Riggs   CATARACT EXTRACTION W/PHACO Right 08/18/2006   Dr. Nile Riggs   COLONOSCOPY     COLONOSCOPY WITH PROPOFOL Left 09/20/2019   Procedure: COLONOSCOPY WITH PROPOFOL;  Surgeon: Kerin Salen, MD;  [facility]: [facility];  Service: Gastroenterology;  Laterality: Left;   COLONOSCOPY WITH PROPOFOL N/A 10/03/2019   Procedure: COLONOSCOPY WITH PROPOFOL;  Surgeon: Charlott Rakes, MD;   [facility]: [facility];  Service: Endoscopy;  Laterality: N/A;   ELBOW SURGERY     BONE SPURS REMOVED B ELBOWS   EYE SURGERY Bilateral    cataracts   FOOT SURGERY     HEEL SPURS REMOVED B HEELS   HEMOSTASIS CLIP PLACEMENT  09/20/2019   Procedure: HEMOSTASIS CLIP PLACEMENT;  Surgeon: Kerin Salen, MD;  [facility];  Service: Gastroenterology;;   HEMOSTASIS CLIP PLACEMENT  10/03/2019   Procedure: HEMOSTASIS CLIP PLACEMENT;  Surgeon: Charlott Rakes, MD;  [facility];  Service: Endoscopy;;   HOT HEMOSTASIS N/A 09/20/2019   Procedure: HOT HEMOSTASIS (ARGON PLASMA COAGULATION/BICAP);  Surgeon: Kerin Salen, MD;  Location: [facility];  Service: Gastroenterology;  Laterality: N/A;   I & D EXTREMITY Right 09/18/2014   Procedure: IRRIGATION AND DEBRIDEMENT RIGHT THUMB, open fracture repair;  Surgeon: Sharma Covert, MD;  Location: [facility];  Service: Orthopedics;  Laterality: Right;   IR IVC FILTER PLMT / S&I /IMG GUID/MOD SED  10/03/2019   LEG SURGERY Left    left thigh table saw accident, "cut to the bone", I&D/suturing   SHOULDER SURGERY Left    Bone spur   SPINE SURGERY  09/04/1985   lumbar spine   SUBMUCOSAL INJECTION  10/03/2019   Procedure: SUBMUCOSAL INJECTION;  Surgeon: Charlott Rakes, MD;  [facility];  Service: Endoscopy;;   TOE SURGERY      Family History  Problem Relation Age of Onset  Cancer Mother    Diabetes Father     SOCIAL HISTORY: Social History   Socioeconomic History   Marital status: Married    Spouse name: Not on file   Number of children: 2   Years of education: Not on file   Highest education level: Not on file  Occupational History   Occupation: Retired and disabled    Employer: [employer]  Tobacco Use   Smoking status: Former    Packs/day: 0.50    Years: 13.00    Additional pack years: 0.00    Total pack years: 6.50    Types: Cigarettes    Quit date: 1964    Years since quitting: 60.3   Smokeless tobacco: Former    Tobacco comments:    smoking since 5 years oldup until the age of 77yrs old  Vaping Use   Vaping Use: Never used  Substance and Sexual Activity   Alcohol use: No   Drug use: No   Sexual activity: Not on file  Other Topics Concern   Not on file  Social History Narrative   Lives with wife.Matthew Zimmerman   He is retired from [employer], also disabled   Malen Gauze son age 30 years old died suddenly at client's home on 11/03/18   Social Determinants of Health   Financial Resource Strain: Low Risk  (09/22/2019)   Overall Financial Resource Strain (CARDIA)    Difficulty of Paying Living Expenses: Not very hard  Food Insecurity: No Food Insecurity (04/07/2020)   Hunger Vital Sign    Worried About Running Out of Food in the Last Year: Never true    Ran Out of Food in the Last Year: Never true  Transportation Needs: No Transportation Needs (04/07/2020)   PRAPARE - Administrator, Civil Service (Medical): No    Lack of Transportation (Non-Medical): No  Physical Activity: Inactive (03/20/2019)   Exercise Vital Sign    Days of Exercise per Week: 0 days    Minutes of Exercise per Session: 0 min  Stress: No Stress Concern Present (09/22/2019)   [employer] of Occupational Health - Occupational Stress Questionnaire    Feeling of Stress : Only a little  Social Connections: Unknown (09/22/2019)   Social Connection and Isolation Panel [NHANES]    Frequency of Communication with Friends and Family: More than three times a week    Frequency of Social Gatherings with Friends and Family: Not on file    Attends Religious Services: Not on Marketing executive or Organizations: Not on file    Attends Banker Meetings: Not on file    Marital Status: Married  Catering manager Violence: Not on file    Allergies  Allergen Reactions   Cortisone Other (See Comments)    CBG thrown out of control   Clotrimazole-Betamethasone Other (See Comments)    Current Outpatient Medications   Medication Sig Dispense Refill   apixaban (ELIQUIS) 5 MG TABS tablet Take 1 tablet (5 mg total) by mouth 2 (two) times daily.     atorvastatin (LIPITOR) 40 MG tablet TAKE 1 TABLET(40 MG) BY MOUTH AT BEDTIME 90 tablet 1   BD INSULIN SYRINGE U/F 31G X 5/16" 0.5 ML MISC 2 (two) times daily.     dapagliflozin propanediol (FARXIGA) 10 MG TABS tablet Take 10 mg by mouth daily.     diclofenac Sodium (VOLTAREN) 1 % GEL Apply 2 g topically 4 (four) times daily. 100 g 1   glucose  blood test strip Use to test blood sugar twice a day     insulin NPH-regular Human (NOVOLIN 70/30) (70-30) 100 UNIT/ML injection Inject 30 Units into the skin 2 (two) times daily with a meal.      Lancets (ONETOUCH DELICA PLUS LANCET33G) MISC Use to test blood sugar twice daily     losartan (COZAAR) 25 MG tablet TAKE 1 TABLET(25 MG) BY MOUTH DAILY 90 tablet 0   metoprolol succinate (TOPROL-XL) 25 MG 24 hr tablet TAKE 1 TABLET(25 MG) BY MOUTH DAILY WITH OR IMMEDIATELY FOLLOWING A MEAL 90 tablet 1   OZEMPIC, 0.25 OR 0.5 MG/DOSE, 2 MG/1.5ML SOPN Inject 0.5 mg into the skin once a week.     tiZANidine (ZANAFLEX) 2 MG tablet Take 1 tablet (2 mg total) by mouth every 8 (eight) hours as needed for muscle spasms. 12 tablet 0   No current facility-administered medications for this visit.    REVIEW OF SYSTEMS:  [X]  denotes positive finding, [ ]  denotes negative finding Cardiac  Comments:  Chest pain or chest pressure:    Shortness of breath upon exertion:    Short of breath when lying flat:    Irregular heart rhythm:        Vascular    Pain in calf, thigh, or hip brought on by ambulation:    Pain in feet at night that wakes you up from your sleep:     Blood clot in your veins:    Leg swelling:  x       Pulmonary    Oxygen at home:    Productive cough:     Wheezing:         Neurologic    Sudden weakness in arms or legs:     Sudden numbness in arms or legs:     Sudden onset of difficulty speaking or slurred speech:     Temporary loss of vision in one eye:     Problems with dizziness:         Gastrointestinal    Blood in stool:     Vomited blood:         Genitourinary    Burning when urinating:     Blood in urine:        Psychiatric    Major depression:         Hematologic    Bleeding problems:    Problems with blood clotting too easily:        Skin    Rashes or ulcers:        Constitutional    Fever or chills:      PHYSICAL EXAM: Vitals:   12/26/22 0858  BP: (!) 156/82  Pulse: (!) 59  Resp: 16  Temp: 97.7 F (36.5 C)  TempSrc: Temporal  SpO2: 98%  Weight: 207 lb (93.9 kg)  Height: 5' 8.5" (1.74 m)    GENERAL: The patient is a well-nourished male, in no acute distress. The vital signs are documented above. CARDIAC: There is a regular rate and rhythm.  VASCULAR:  Bilateral femoral pulses palpable Bilateral DP pulses palpable Bilateral lower extremity swelling with calf circumference of 43 cm No lower extremity ulcerations PULMONARY: No respiratory distress. ABDOMEN: Soft and non-tender with normal pitched bowel sounds.  MUSCULOSKELETAL: There are no major deformities or cyanosis. NEUROLOGIC: No focal weakness or paresthesias are detected. SKIN: There are no ulcers or rashes noted. PSYCHIATRIC: The patient has a normal affect.  DATA:   ABI's on 12/18/22 were [place] with  triphasic waveforms.    Assessment/Plan:  77 year old male presents for evaluation of bilateral lower extremity pain and swelling as it relates to chronic DVTs.  I discussed that his chronic DVTs puts him at risk for post thrombotic syndrome with damage to the valves leading to venous HTN and prolonged pain and swelling.  I have asked that he elevate his legs, do compression stockings, and also take his anticoagulation.  The question is that he does have a IVC filter that was placed in 2021 by IR and his last CT showed likely ileocaval thrombus in 2021.  I have recommended a CT abdomen pelvis venogram to further  evaluate if he has any surgical options to remove his filter and see if we can do any kind of recanalization.  I will see him back once this is done.   Cephus Shelling, MD [facility] Office: [phone]

## 2022-12-27 ENCOUNTER — Other Ambulatory Visit: Payer: Self-pay | Admitting: Cardiology

## 2022-12-27 DIAGNOSIS — E7800 Pure hypercholesterolemia, unspecified: Secondary | ICD-10-CM

## 2022-12-28 ENCOUNTER — Other Ambulatory Visit: Payer: Self-pay | Admitting: Cardiology

## 2022-12-28 DIAGNOSIS — I10 Essential (primary) hypertension: Secondary | ICD-10-CM

## 2022-12-28 DIAGNOSIS — E1139 Type 2 diabetes mellitus with other diabetic ophthalmic complication: Secondary | ICD-10-CM

## 2023-01-09 ENCOUNTER — Other Ambulatory Visit: Payer: Self-pay

## 2023-01-09 DIAGNOSIS — I825Y3 Chronic embolism and thrombosis of unspecified deep veins of proximal lower extremity, bilateral: Secondary | ICD-10-CM

## 2023-01-25 ENCOUNTER — Ambulatory Visit (HOSPITAL_COMMUNITY)
Admission: RE | Admit: 2023-01-25 | Discharge: 2023-01-25 | Disposition: A | Discharge status: Home / Self Care | Payer: PPO | Source: Ambulatory Visit | Attending: Vascular Surgery | Admitting: Vascular Surgery

## 2023-01-25 DIAGNOSIS — I82501 Chronic embolism and thrombosis of unspecified deep veins of right lower extremity: Secondary | ICD-10-CM

## 2023-01-25 DIAGNOSIS — I825Y3 Chronic embolism and thrombosis of unspecified deep veins of proximal lower extremity, bilateral: Secondary | ICD-10-CM | POA: Diagnosis present

## 2023-01-25 MED ORDER — IOHEXOL 350 MG/ML SOLN
100.0000 mL | Freq: Once | INTRAVENOUS | Status: AC | PRN
  Administered 2023-01-25: 100 mL via INTRAVENOUS

## 2023-01-30 ENCOUNTER — Encounter: Payer: Self-pay | Admitting: Vascular Surgery

## 2023-01-30 ENCOUNTER — Ambulatory Visit: Payer: PPO | Admitting: Vascular Surgery

## 2023-01-30 VITALS — BP 131/76 | HR 69 | Temp 98.4°F | Resp 18 | Ht 68.5 in | Wt 201.0 lb

## 2023-01-30 DIAGNOSIS — I825Y3 Chronic embolism and thrombosis of unspecified deep veins of proximal lower extremity, bilateral: Secondary | ICD-10-CM | POA: Diagnosis not present

## 2023-01-30 LAB — POCT I-STAT CREATININE: Creatinine, Ser: 1.6 mg/dL — ABNORMAL HIGH (ref 0.61–1.24)

## 2023-01-30 NOTE — Progress Notes (Signed)
Patient name: Matthew Zimmerman MRN: [id number] DOB: [birth year] Sex: male  REASON FOR CONSULT: DVT bilateral lower extremities with pain, F/U after CT venogram  HPI: Matthew Zimmerman is a 77 y.o. male, with history of hypertension, hyperlipidemia, DVT/PE that presents for evaluation of bilateral lower extremity pain and swelling.  He states his lower extremity DVTs were initially diagnosed around 2014- 2015.  He was placed on warfarin and then transitioned to Eliquis.  He then had recurrent DVTs in 2021 with subsequent GI bleed requiring IVC filter placement.  IVC filter was placed in January 2021 by IR.  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  Was previously seen by Dr. Arbie Cookey on 12/30/2019 and he was instructed to elevate his legs, do compression stockings, and take his anticoagulation.  His legs swell every day now.  Cannot get in his compression stockings.  I sent him for CT venogram to further evaluate.  Past Medical History:  Diagnosis Date   Arthritis    Carpal tunnel syndrome on both sides    Chest pain    Coronary artery calcification seen on CAT scan    DVT (deep venous thrombosis) (HCC)    GERD (gastroesophageal reflux disease)    Headache    Hypercholesteremia    Hyperlipidemia    Hypertension    Neck muscle spasm    PE (pulmonary embolism)    Shortness of breath dyspnea    PE     Past Surgical History:  Procedure Laterality Date   BILATERAL CARPAL TUNNEL RELEASE Bilateral    CATARACT EXTRACTION W/PHACO Left 08/05/2006   Dr. Nile Riggs   CATARACT EXTRACTION W/PHACO Right 08/18/2006   Dr. Nile Riggs   COLONOSCOPY     COLONOSCOPY WITH PROPOFOL Left 09/20/2019   Procedure: COLONOSCOPY WITH PROPOFOL;  Surgeon: Kerin Salen, MD;  [facility];  Service: Gastroenterology;  Laterality: Left;   COLONOSCOPY WITH PROPOFOL N/A 10/03/2019   Procedure: COLONOSCOPY WITH PROPOFOL;  Surgeon:  Charlott Rakes, MD;  [facility];  Service: Endoscopy;  Laterality: N/A;   ELBOW SURGERY     BONE SPURS REMOVED B ELBOWS   EYE SURGERY Bilateral    cataracts   FOOT SURGERY     HEEL SPURS REMOVED B HEELS   HEMOSTASIS CLIP PLACEMENT  09/20/2019   Procedure: HEMOSTASIS CLIP PLACEMENT;  Surgeon: Kerin Salen, MD;  [facility];  Service: Gastroenterology;;   HEMOSTASIS CLIP PLACEMENT  10/03/2019   Procedure: HEMOSTASIS CLIP PLACEMENT;  Surgeon: Charlott Rakes, MD;  [facility];  Service: Endoscopy;;   HOT HEMOSTASIS N/A 09/20/2019   Procedure: HOT HEMOSTASIS (ARGON PLASMA COAGULATION/BICAP);  Surgeon: Kerin Salen, MD;  Location: [facility];  Service: Gastroenterology;  Laterality: N/A;   I & D EXTREMITY Right 09/18/2014   Procedure: IRRIGATION AND DEBRIDEMENT RIGHT THUMB, open fracture repair;  Surgeon: Sharma Covert, MD;  [facility]: [facility];  Service: Orthopedics;  Laterality: Right;   IR IVC FILTER PLMT / S&I /IMG GUID/MOD SED  10/03/2019   LEG SURGERY Left    left thigh table saw accident, "cut to the bone", I&D/suturing   SHOULDER SURGERY Left    Bone spur   SPINE SURGERY  09/04/1985   lumbar spine   SUBMUCOSAL INJECTION  10/03/2019   Procedure: SUBMUCOSAL INJECTION;  Surgeon: Charlott Rakes, MD;  [facility];  Service: Endoscopy;;   TOE SURGERY      Family History  Problem  Relation Age of Onset   Cancer Mother    Diabetes Father     SOCIAL HISTORY: Social History   Socioeconomic History   Marital status: Married    Spouse name: Not on file   Number of children: 2   Years of education: Not on file   Highest education level: Not on file  Occupational History   Occupation: Retired and disabled    Employer: [employer]  Tobacco Use   Smoking status: Former    Packs/day: 0.50    Years: 13.00    Additional pack years: 0.00    Total pack years: 6.50    Types: Cigarettes    Quit date: 1964    Years since quitting: 60.4    Smokeless tobacco: Former   Tobacco comments:    smoking since 5 years oldup until the age of 77yrs old  Vaping Use   Vaping Use: Never used  Substance and Sexual Activity   Alcohol use: No   Drug use: No   Sexual activity: Not on file  Other Topics Concern   Not on file  Social History Narrative   Lives with wife.Talbert Forest   He is retired from [employer], also disabled   Malen Gauze son age 36 years old died suddenly at client's home on 11/03/18   Social Determinants of Health   Financial Resource Strain: Low Risk  (09/22/2019)   Overall Financial Resource Strain (CARDIA)    Difficulty of Paying Living Expenses: Not very hard  Food Insecurity: No Food Insecurity (04/07/2020)   Hunger Vital Sign    Worried About Running Out of Food in the Last Year: Never true    Ran Out of Food in the Last Year: Never true  Transportation Needs: No Transportation Needs (04/07/2020)   PRAPARE - Administrator, Civil Service (Medical): No    Lack of Transportation (Non-Medical): No  Physical Activity: Inactive (03/20/2019)   Exercise Vital Sign    Days of Exercise per Week: 0 days    Minutes of Exercise per Session: 0 min  Stress: No Stress Concern Present (09/22/2019)   [employer] of Occupational Health - Occupational Stress Questionnaire    Feeling of Stress : Only a little  Social Connections: Unknown (09/22/2019)   Social Connection and Isolation Panel [NHANES]    Frequency of Communication with Friends and Family: More than three times a week    Frequency of Social Gatherings with Friends and Family: Not on file    Attends Religious Services: Not on Marketing executive or Organizations: Not on file    Attends Banker Meetings: Not on file    Marital Status: Married  Catering manager Violence: Not on file    Allergies  Allergen Reactions   Cortisone Other (See Comments)    CBG thrown out of control   Clotrimazole-Betamethasone Other (See Comments)     Current Outpatient Medications  Medication Sig Dispense Refill   apixaban (ELIQUIS) 5 MG TABS tablet Take 1 tablet (5 mg total) by mouth 2 (two) times daily.     atorvastatin (LIPITOR) 40 MG tablet TAKE 1 TABLET(40 MG) BY MOUTH AT BEDTIME 90 tablet 1   BD INSULIN SYRINGE U/F 31G X 5/16" 0.5 ML MISC 2 (two) times daily.     dapagliflozin propanediol (FARXIGA) 10 MG TABS tablet Take 10 mg by mouth daily.     diclofenac Sodium (VOLTAREN) 1 % GEL Apply 2 g topically 4 (four) times daily.  100 g 1   glucose blood test strip Use to test blood sugar twice a day     insulin NPH-regular Human (NOVOLIN 70/30) (70-30) 100 UNIT/ML injection Inject 30 Units into the skin 2 (two) times daily with a meal.      Lancets (ONETOUCH DELICA PLUS LANCET33G) MISC Use to test blood sugar twice daily     losartan (COZAAR) 25 MG tablet TAKE 1 TABLET(25 MG) BY MOUTH DAILY 90 tablet 0   metoprolol succinate (TOPROL-XL) 25 MG 24 hr tablet TAKE 1 TABLET(25 MG) BY MOUTH DAILY WITH OR IMMEDIATELY FOLLOWING A MEAL 90 tablet 1   OZEMPIC, 0.25 OR 0.5 MG/DOSE, 2 MG/1.5ML SOPN Inject 0.5 mg into the skin once a week.     tiZANidine (ZANAFLEX) 2 MG tablet Take 1 tablet (2 mg total) by mouth every 8 (eight) hours as needed for muscle spasms. 12 tablet 0   No current facility-administered medications for this visit.    REVIEW OF SYSTEMS:  [X]  denotes positive finding, [ ]  denotes negative finding Cardiac  Comments:  Chest pain or chest pressure:    Shortness of breath upon exertion:    Short of breath when lying flat:    Irregular heart rhythm:        Vascular    Pain in calf, thigh, or hip brought on by ambulation:    Pain in feet at night that wakes you up from your sleep:     Blood clot in your veins:    Leg swelling:  x       Pulmonary    Oxygen at home:    Productive cough:     Wheezing:         Neurologic    Sudden weakness in arms or legs:     Sudden numbness in arms or legs:     Sudden onset of  difficulty speaking or slurred speech:    Temporary loss of vision in one eye:     Problems with dizziness:         Gastrointestinal    Blood in stool:     Vomited blood:         Genitourinary    Burning when urinating:     Blood in urine:        Psychiatric    Major depression:         Hematologic    Bleeding problems:    Problems with blood clotting too easily:        Skin    Rashes or ulcers:        Constitutional    Fever or chills:      PHYSICAL EXAM: Vitals:   01/30/23 1054  BP: 131/76  Pulse: 69  Resp: 18  Temp: 98.4 F (36.9 C)  TempSrc: Temporal  SpO2: 99%  Weight: 201 lb (91.2 kg)  Height: 5' 8.5" (1.74 m)    GENERAL: The patient is a well-nourished male, in no acute distress. The vital signs are documented above. CARDIAC: There is a regular rate and rhythm.  VASCULAR:  Bilateral femoral pulses palpable Bilateral DP pulses palpable Bilateral lower extremity swelling with calf circumference of 43 cm No lower extremity ulcerations PULMONARY: No respiratory distress. ABDOMEN: Soft and non-tender with normal pitched bowel sounds.  MUSCULOSKELETAL: There are no major deformities or cyanosis. NEUROLOGIC: No focal weakness or paresthesias are detected. SKIN: There are no ulcers or rashes noted. PSYCHIATRIC: The patient has a normal affect.  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  ABI's on 12/18/22 were [place] with triphasic waveforms.    Assessment/Plan:  77 year old male presents for evaluation of bilateral lower extremity pain and swelling as it relates to chronic DVTs. I sent him for CT venogram given complex history in HPI.  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  I had a long discussion with him regarding the severity of his symptoms.  Discussed that we could  certainly try and retrieve his IVC filter and potentially stent his cava but this could put him at significant risk for complications including retroperitoneal bleed and other associated complications from anesthesia.  Ultimately he feels his symptoms are tolerable.  I fully support conservative therapy with leg elevation and compression stockings.  I discussed he can get a wedge pillow.  I will see him in 1 year to see how he is doing.  He is also tolerating Eliquis without any issues.  Cephus Shelling, MD [facility] Office: [phone]

## 2023-01-31 ENCOUNTER — Ambulatory Visit: Payer: PPO | Admitting: Podiatry

## 2023-01-31 ENCOUNTER — Encounter: Payer: Self-pay | Admitting: Podiatry

## 2023-01-31 ENCOUNTER — Ambulatory Visit (INDEPENDENT_AMBULATORY_CARE_PROVIDER_SITE_OTHER): Payer: PPO | Admitting: Podiatry

## 2023-01-31 DIAGNOSIS — E1142 Type 2 diabetes mellitus with diabetic polyneuropathy: Secondary | ICD-10-CM | POA: Diagnosis not present

## 2023-01-31 DIAGNOSIS — M79675 Pain in left toe(s): Secondary | ICD-10-CM

## 2023-01-31 DIAGNOSIS — B351 Tinea unguium: Secondary | ICD-10-CM | POA: Diagnosis not present

## 2023-01-31 DIAGNOSIS — M79674 Pain in right toe(s): Secondary | ICD-10-CM | POA: Diagnosis not present

## 2023-01-31 NOTE — Progress Notes (Addendum)
This patient returns to my office for at risk foot care.  This patient requires this care by a professional since this patient will be at risk due to having diabetes DVT AKF and coagulation defect due to eliquis. This patient is unable to cut nails himself since the patient cannot reach his nails.These nails are painful walking and wearing shoes.  This patient presents for at risk foot care today.  General Appearance  Alert, conversant and in no acute stress.  Vascular  Dorsalis pedis and posterior tibial  pulses are palpable  bilaterally.  Capillary return is within normal limits  bilaterally. Temperature is within normal limits  bilaterally.  Neurologic  Senn-Weinstein monofilament wire test within normal limits  bilaterally. Muscle power within normal limits bilaterally.  Nails Thick disfigured discolored nails with subungual debris  from hallux to fifth toes bilaterally. No evidence of bacterial infection or drainage bilaterally.  Orthopedic  No limitations of motion  feet .  No crepitus or effusions noted.  No bony pathology or digital deformities noted.  Skin  normotropic skin with no porokeratosis noted bilaterally.  No signs of infections or ulcers noted.     Onychomycosis  Pain in right toes  Pain in left toes  Consent was obtained for treatment procedures.   Mechanical debridement of nails 1-5  bilaterally performed with a nail nipper.  Filed with dremel without incident.    Return office visit  4 months                     Told patient to return for periodic foot care and evaluation due to potential at risk complications.   Helane Gunther DPM

## 2023-03-27 ENCOUNTER — Other Ambulatory Visit: Payer: Self-pay | Admitting: Cardiology

## 2023-03-27 DIAGNOSIS — I10 Essential (primary) hypertension: Secondary | ICD-10-CM

## 2023-03-27 DIAGNOSIS — E1139 Type 2 diabetes mellitus with other diabetic ophthalmic complication: Secondary | ICD-10-CM

## 2023-05-18 ENCOUNTER — Other Ambulatory Visit: Payer: Self-pay | Admitting: Cardiology

## 2023-05-18 DIAGNOSIS — I10 Essential (primary) hypertension: Secondary | ICD-10-CM

## 2023-05-28 ENCOUNTER — Ambulatory Visit: Payer: HMO | Admitting: Cardiology

## 2023-06-04 ENCOUNTER — Encounter: Payer: Self-pay | Admitting: Podiatry

## 2023-06-04 ENCOUNTER — Ambulatory Visit (INDEPENDENT_AMBULATORY_CARE_PROVIDER_SITE_OTHER): Payer: PPO | Admitting: Podiatry

## 2023-06-04 DIAGNOSIS — B351 Tinea unguium: Secondary | ICD-10-CM

## 2023-06-04 DIAGNOSIS — E1142 Type 2 diabetes mellitus with diabetic polyneuropathy: Secondary | ICD-10-CM | POA: Diagnosis not present

## 2023-06-04 DIAGNOSIS — M79675 Pain in left toe(s): Secondary | ICD-10-CM

## 2023-06-04 DIAGNOSIS — M79674 Pain in right toe(s): Secondary | ICD-10-CM | POA: Diagnosis not present

## 2023-06-04 NOTE — Progress Notes (Signed)
This patient returns to my office for at risk foot care.  This patient requires this care by a professional since this patient will be at risk due to having diabetes DVT AKF and coagulation defect due to eliquis. This patient is unable to cut nails himself since the patient cannot reach his nails.These nails are painful walking and wearing shoes.  This patient presents for at risk foot care today.  General Appearance  Alert, conversant and in no acute stress.  Vascular  Dorsalis pedis and posterior tibial  pulses are palpable  bilaterally.  Capillary return is within normal limits  bilaterally. Temperature is within normal limits  bilaterally.  Neurologic  Senn-Weinstein monofilament wire test within normal limits  bilaterally. Muscle power within normal limits bilaterally.  Nails Thick disfigured discolored nails with subungual debris  from hallux to fifth toes bilaterally. No evidence of bacterial infection or drainage bilaterally.  Orthopedic  No limitations of motion  feet .  No crepitus or effusions noted.  No bony pathology or digital deformities noted.  Skin  normotropic skin with no porokeratosis noted bilaterally.  No signs of infections or ulcers noted.     Onychomycosis  Pain in right toes  Pain in left toes  Consent was obtained for treatment procedures.   Mechanical debridement of nails 1-5  bilaterally performed with a nail nipper.  Filed with dremel without incident.    Return office visit  4 months                     Told patient to return for periodic foot care and evaluation due to potential at risk complications.   Helane Gunther DPM

## 2023-06-13 ENCOUNTER — Ambulatory Visit: Payer: PPO | Attending: Cardiology | Admitting: Cardiology

## 2023-06-13 ENCOUNTER — Encounter: Payer: Self-pay | Admitting: Cardiology

## 2023-06-13 VITALS — BP 120/62 | HR 60 | Resp 16 | Ht 68.0 in | Wt 192.8 lb

## 2023-06-13 DIAGNOSIS — I2510 Atherosclerotic heart disease of native coronary artery without angina pectoris: Secondary | ICD-10-CM

## 2023-06-13 DIAGNOSIS — Z86711 Personal history of pulmonary embolism: Secondary | ICD-10-CM

## 2023-06-13 DIAGNOSIS — I10 Essential (primary) hypertension: Secondary | ICD-10-CM | POA: Diagnosis not present

## 2023-06-13 DIAGNOSIS — E1139 Type 2 diabetes mellitus with other diabetic ophthalmic complication: Secondary | ICD-10-CM | POA: Diagnosis not present

## 2023-06-13 DIAGNOSIS — E7800 Pure hypercholesterolemia, unspecified: Secondary | ICD-10-CM | POA: Diagnosis not present

## 2023-06-13 DIAGNOSIS — Z794 Long term (current) use of insulin: Secondary | ICD-10-CM

## 2023-06-13 NOTE — Patient Instructions (Signed)
Medication Instructions:  Your physician recommends that you continue on your current medications as directed. Please refer to the Current Medication list given to you today.  *If you need a refill on your cardiac medications before your next appointment, please call your pharmacy*  Lab Work: None ordered If you have labs (blood work) drawn today and your tests are completely normal, you will receive your results only by: MyChart Message (if you have MyChart) OR A paper copy in the mail If you have any lab test that is abnormal or we need to change your treatment, we will call you to review the results.  Testing/Procedures: None ordered  Follow-Up: At [facility], you and your health needs are our priority.  As part of our continuing mission to provide you with exceptional heart care, we have created designated Provider Care Teams.  These Care Teams include your primary Cardiologist (physician) and Advanced Practice Providers (APPs -  Physician Assistants and Nurse Practitioners) who all work together to provide you with the care you need, when you need it.  We recommend signing up for the patient portal called "MyChart".  Sign up information is provided on this After Visit Summary.  MyChart is used to connect with patients for Virtual Visits (Telemedicine).  Patients are able to view lab/test results, encounter notes, upcoming appointments, etc.  Non-urgent messages can be sent to your provider as well.   To learn more about what you can do with MyChart, go to [email or web address].    Your next appointment:   1 year(s)  The format for your next appointment:   In Person  Provider:   Tessa Lerner, DO {

## 2023-06-13 NOTE — Progress Notes (Signed)
Cardiology Office Note:  .   Date:  06/13/2023  ID:  Raahim Golas, DOB 11-20-1945, MRN [id number] PCP:  Irena Reichmann, DO  Former Cardiology Providers: Altamese [place], APRN, FNP-C  Vascular surgery: Dr. Sherald Hess [facility] Providers Cardiologist:  Tessa Lerner, DO , [facility] (established care 03/09/2020) Electrophysiologist:  None  Click to update primary MD,subspecialty MD or APP then REFRESH:1}    Chief Complaint  Patient presents with   Coronary artery calcification seen on CT scan   Follow-up    History of Present Illness: .   Matthew Zimmerman is a 77 y.o. African-American male whose past medical history and cardiovascular risk factors includes: Hypertension, type 2 diabetes mellitus with complications, hyperlipidemia, former smoker, history of DVT/PE and status post IVC filter on oral anticoagulation, coronary artery calcification by CT scan, 77 advanced age.   Patient presents today for 46-month follow-up visit given his history of CAC and multiple cardiovascular risk factors.  Since last office visit he denies anginal chest pain or heart failure symptoms.  He had significant lower extremity swelling in the last office visit and underwent lower extremity venous duplex which notes chronic DVT.  He does have IVC filter in place as well as being on anticoagulation.  His anticoagulation is managed by PCP.  And he has followed up with vascular surgery for his peripheral vascular comorbidities.  Review of Systems: .   Review of Systems  Cardiovascular:  Positive for leg swelling. Negative for chest pain, claudication, dyspnea on exertion, near-syncope, orthopnea, palpitations, paroxysmal nocturnal dyspnea and syncope.  Respiratory:  Negative for cough, shortness of breath and wheezing.     Studies Reviewed:   EKG: EKG Interpretation Date/Time:  Wednesday June 13 2023 10:59:43 EDT Ventricular Rate:  60 PR Interval:  210 QRS Duration:  138 QT Interval:  426 QTC  Calculation: 426 R Axis:   -29  Text Interpretation: Sinus rhythm with 1st degree A-V block Right bundle branch block When compared with ECG of 13-Oct-2019 16:10, HEART RATE has decreased Since last tracing Confirmed by Tessa Lerner [phone]) on 06/13/2023 11:19:53 AM   Echocardiogram: 05/05/2019: LVEF 55%, mild LVH, grade 1 diastolic impairment, no significant valvular heart disease.   11/13/2022: Normal LV systolic function with visual EF 55-60%. Left ventricle cavity is normal in size. Normal left ventricular wall thickness with presence of a septal bulge. Normal global wall motion. Normal diastolic filling pattern, normal LAP.  Aortic valve sclerosis without stenosis. Mild pulmonic regurgitation. Compared to 05/05/2019 LVEF remain stable and G1DD is now normal otherwise no significant change.    Stress Testing:  Lexiscan (Walking with mod Bruce)Tetrofosmin Stress Test  03/23/2020: Nondiagnostic ECG stress. Resting EKG/ECG demonstrated normal sinus rhythm. Left QRS axis deviation in the presence of right bundle branch block. Non-specific ST-T abnormality. Resting ECG revealed premature ventricular contractions. PVC and V- Bigeminy persisted throughout the lexiscan stress with mod-Bruce protocol. No additional ST-T changes from baseline.  There was diaphragmatic attenuation in inferior wall, otherwise myocardial perfusion is normal. Overall LV systolic function is normal without regional wall motion abnormalities. There is a fixed mild defect in the inferior region.  Stress LV EF: 60%.  No previous exam available for comparison. Low risk.    Heart Catheterization: None   IVC/Iliac Study 10/14/2019: Abdominal Aorta: There is evidence of abnormal dilatation of the distal Abdominal aorta. IVC/Iliac: There is evidence of acute thrombus involving the IVC. There is evidence of acute thrombus involving the right common iliac vein, left common iliac vein, right  external iliac vein, left external  iliac vein. Visualization of proximal Inferior Vena Cava was limited.   [name] LE Venous Duplex 03.25.2024 RIGHT:  - Findings consistent with chronic deep vein thrombosis involving the right common femoral vein, and right popliteal vein.    LEFT:  - Findings consistent with chronic deep vein thrombosis involving the left common femoral vein, left popliteal vein, and left femoral vein.  - Findings consistent with chronic superficial vein thrombosis involving the left small saphenous vein.   RADIOLOGY: CT of abdomen 01/16/2018:  Aortic atherosclerosis, in addition to least 2 vessel coronary artery disease.  There are calcifications of the aortic valve.   Risk Assessment/Calculations:   N/A   Labs:       Latest Ref Rng & Units 08/06/2020    9:29 AM 08/05/2020   10:37 PM 08/05/2020    2:44 PM  CBC  Hemoglobin 13.0 - 17.0 g/dL 84.6  96.2  95.2   Hematocrit 39.0 - 52.0 % 40.4  40.1  43.1        Latest Ref Rng & Units 01/25/2023    4:11 PM 11/23/2022   11:31 AM 04/29/2021    8:52 AM  BMP  Glucose 70 - 99 mg/dL  841  324   BUN 8 - 27 mg/dL  20  20   Creatinine 4.01 - 1.24 mg/dL 0.27  2.53  6.64   BUN/Creat Ratio 10 - 24  14  13    Sodium 134 - 144 mmol/L  140  140   Potassium 3.5 - 5.2 mmol/L  4.5  5.2   Chloride 96 - 106 mmol/L  101  105   CO2 20 - 29 mmol/L  26  21   Calcium 8.6 - 10.2 mg/dL  9.8  9.6       Latest Ref Rng & Units 01/25/2023    4:11 PM 11/23/2022   11:31 AM 04/29/2021    8:52 AM  CMP  Glucose 70 - 99 mg/dL  403  474   BUN 8 - 27 mg/dL  20  20   Creatinine 2.59 - 1.24 mg/dL 5.63  8.75  6.43   Sodium 134 - 144 mmol/L  140  140   Potassium 3.5 - 5.2 mmol/L  4.5  5.2   Chloride 96 - 106 mmol/L  101  105   CO2 20 - 29 mmol/L  26  21   Calcium 8.6 - 10.2 mg/dL  9.8  9.6   Total Protein 6.0 - 8.5 g/dL  7.4  7.2   Total Bilirubin 0.0 - 1.2 mg/dL  0.7  0.5   Alkaline Phos 44 - 121 IU/L  124  112   AST 0 - 40 IU/L  21  24   ALT 0 - 44 IU/L  28  36     Lab Results   Component Value Date   CHOL 125 11/23/2022   HDL 63 11/23/2022   LDLCALC 50 11/23/2022   LDLDIRECT 52 11/23/2022   TRIG 54 11/23/2022   No results for input(s): "LIPOA" in the last 8760 hours. No components found for: "NTPROBNP" No results for input(s): "PROBNP" in the last 8760 hours. No results for input(s): "TSH" in the last 8760 hours.  External Labs: Collected 01/26/2023 available in Care Everywhere. BUN 19, creatinine 1.35. Sodium 142, potassium 4.5, chloride 105, bicarb 22 Total cholesterol 119, triglycerides 49, HDL 62, LDL calculated 45  Collected: 05/28/2023 available in Care Everywhere. A1c 9.0  Physical Exam:    Today's  Vitals   06/13/23 1056  BP: 120/62  Pulse: 60  Resp: 16  SpO2: 97%  Weight: 192 lb 12.8 oz (87.5 kg)  Height: 5\' 8"  (1.727 m)   Body mass index is 29.32 kg/m. Wt Readings from Last 3 Encounters:  06/13/23 192 lb 12.8 oz (87.5 kg)  01/30/23 201 lb (91.2 kg)  12/26/22 207 lb (93.9 kg)    Physical Exam  Constitutional: No distress.  Age appropriate, hemodynamically stable.   Neck: No JVD present.  Cardiovascular: Normal rate, regular rhythm, S1 normal, S2 normal and intact distal pulses. Exam reveals no gallop, no S3 and no S4.  No murmur heard. Pulses:      Femoral pulses are 2+ on the right side and 2+ on the left side.      Dorsalis pedis pulses are 1+ on the right side and 1+ on the left side.       Posterior tibial pulses are 1+ on the right side and 1+ on the left side.  Pulmonary/Chest: Effort normal and breath sounds normal. No stridor. He has no wheezes. He has no rales.  Abdominal: Soft. Bowel sounds are normal. He exhibits no distension. There is no abdominal tenderness.  Musculoskeletal:        General: Edema (RLE>LLE (chronic and stable)) present.     Cervical back: Neck supple.  Neurological: He is alert and oriented to person, place, and time. He has intact cranial nerves (2-12).  Skin: Skin is warm and moist.     Impression & Recommendation(s):  Impression:   ICD-10-CM   1. Coronary artery calcification seen on CT scan  I25.10     2. Essential hypertension  I10 EKG 12-Lead    3. Hypercholesterolemia  E78.00     4. Type 2 diabetes mellitus with other ophthalmic complication, with long-term current use of insulin (HCC)  E11.39    Z79.4     5. Long-term insulin use (HCC)  Z79.4     6. Hx pulmonary embolism  Z86.711        Recommendation(s):  Coronary artery calcification seen on CT scan Currently not on antiplatelet as he is on anticoagulation given his history of DVT/PE. Continue statin therapy-Lipitor 40 mg p.o. nightly. LDL is <55 mg/dL as of May 2024 (outside labs independently reviewed and noted above for further reference). Reemphasized importance of improving his modifiable cardiovascular risk factors. Prior cardiovascular workup reviewed as part of medical decision making at today's office visit.  Essential hypertension Office blood pressures are at goal. Continue Farxiga, losartan, Toprol-XL Reemphasize importance of low-salt diet  Hypercholesterolemia LDL currently at goal. Continue current dose of atorvastatin as discussed above. Outside labs independently reviewed as of May 2024.  Type 2 diabetes mellitus with other ophthalmic complication, with long-term current use of insulin (HCC) Hemoglobin A1c is currently not at goal. Currently managed by PCP. Currently on Ozempic given his diabetes and CAC.  However he is on the lowest dose of Ozempic 0.25 mg subcu weekly.  He has been on this for many months.  I have asked him to reach out to his PCP who has been prescribing medication and to go up on the dose every 4 weeks as tolerated.  Patient has history of DVT/PE as well as status post IVC filter.  He follows up with vascular surgery and will defer management to them.  Orders Placed:  Orders Placed This Encounter  Procedures   EKG 12-Lead    As part of medical  decision making results  of the vascular surgery progress note, [facility] database review labs, lower extremity venous duplex from March 2024, were reviewed independently at today's visit.   Final Medication List:   No orders of the defined types were placed in this encounter.   There are no discontinued medications.   Current Outpatient Medications:    apixaban (ELIQUIS) 5 MG TABS tablet, Take 1 tablet (5 mg total) by mouth 2 (two) times daily., Disp: , Rfl:    atorvastatin (LIPITOR) 40 MG tablet, TAKE 1 TABLET(40 MG) BY MOUTH AT BEDTIME, Disp: 90 tablet, Rfl: 1   BD INSULIN SYRINGE U/F 31G X 5/16" 0.5 ML MISC, 2 (two) times daily., Disp: , Rfl:    diclofenac Sodium (VOLTAREN) 1 % GEL, Apply 2 g topically 4 (four) times daily., Disp: 100 g, Rfl: 1   glucose blood test strip, Use to test blood sugar twice a day, Disp: , Rfl:    insulin NPH-regular Human (NOVOLIN 70/30) (70-30) 100 UNIT/ML injection, Inject 30 Units into the skin 2 (two) times daily with a meal. , Disp: , Rfl:    Lancets (ONETOUCH DELICA PLUS LANCET33G) MISC, Use to test blood sugar twice daily, Disp: , Rfl:    losartan (COZAAR) 25 MG tablet, TAKE 1 TABLET(25 MG) BY MOUTH DAILY, Disp: 90 tablet, Rfl: 0   metoprolol succinate (TOPROL-XL) 25 MG 24 hr tablet, TAKE 1 TABLET(25 MG) BY MOUTH DAILY WITH OR IMMEDIATELY FOLLOWING A MEAL, Disp: 90 tablet, Rfl: 1   OZEMPIC, 0.25 OR 0.5 MG/DOSE, 2 MG/1.5ML SOPN, Inject 0.5 mg into the skin once a week., Disp: , Rfl:    tiZANidine (ZANAFLEX) 2 MG tablet, Take 1 tablet (2 mg total) by mouth every 8 (eight) hours as needed for muscle spasms., Disp: 12 tablet, Rfl: 0   dapagliflozin propanediol (FARXIGA) 10 MG TABS tablet, Take 10 mg by mouth daily., Disp: , Rfl:   Consent:      N/A  Disposition:   Return in about 1 year (around 06/12/2024) for Follow up, Coronary artery calcification. or sooner if needed.  His questions and concerns were addressed to his satisfaction. He voices understanding of  the recommendations provided during this encounter.    Signed, Tessa Lerner, DO, [facility]  [facility]  [address] [place], [place] [address] [phone] 06/13/2023 12:14 PM

## 2023-06-23 ENCOUNTER — Other Ambulatory Visit: Payer: Self-pay | Admitting: Cardiology

## 2023-06-23 DIAGNOSIS — E7800 Pure hypercholesterolemia, unspecified: Secondary | ICD-10-CM

## 2023-07-03 ENCOUNTER — Other Ambulatory Visit: Payer: Self-pay | Admitting: Cardiology

## 2023-07-03 DIAGNOSIS — I10 Essential (primary) hypertension: Secondary | ICD-10-CM

## 2023-07-03 DIAGNOSIS — E1139 Type 2 diabetes mellitus with other diabetic ophthalmic complication: Secondary | ICD-10-CM

## 2023-10-04 ENCOUNTER — Ambulatory Visit: Payer: PPO | Admitting: Podiatry

## 2023-10-08 DIAGNOSIS — E1165 Type 2 diabetes mellitus with hyperglycemia: Secondary | ICD-10-CM | POA: Diagnosis not present

## 2023-10-15 DIAGNOSIS — N1831 Chronic kidney disease, stage 3a: Secondary | ICD-10-CM | POA: Diagnosis not present

## 2023-10-15 DIAGNOSIS — E785 Hyperlipidemia, unspecified: Secondary | ICD-10-CM | POA: Diagnosis not present

## 2023-10-15 DIAGNOSIS — I2510 Atherosclerotic heart disease of native coronary artery without angina pectoris: Secondary | ICD-10-CM | POA: Diagnosis not present

## 2023-10-15 DIAGNOSIS — E1142 Type 2 diabetes mellitus with diabetic polyneuropathy: Secondary | ICD-10-CM | POA: Diagnosis not present

## 2023-10-15 DIAGNOSIS — E1165 Type 2 diabetes mellitus with hyperglycemia: Secondary | ICD-10-CM | POA: Diagnosis not present

## 2023-10-15 DIAGNOSIS — I10 Essential (primary) hypertension: Secondary | ICD-10-CM | POA: Diagnosis not present

## 2023-10-15 DIAGNOSIS — E1122 Type 2 diabetes mellitus with diabetic chronic kidney disease: Secondary | ICD-10-CM | POA: Diagnosis not present

## 2023-11-15 ENCOUNTER — Ambulatory Visit (INDEPENDENT_AMBULATORY_CARE_PROVIDER_SITE_OTHER): Payer: PPO | Admitting: Podiatry

## 2023-11-15 ENCOUNTER — Encounter: Payer: Self-pay | Admitting: Podiatry

## 2023-11-15 VITALS — Ht 68.0 in | Wt 192.0 lb

## 2023-11-15 DIAGNOSIS — E1142 Type 2 diabetes mellitus with diabetic polyneuropathy: Secondary | ICD-10-CM

## 2023-11-15 DIAGNOSIS — M79675 Pain in left toe(s): Secondary | ICD-10-CM | POA: Diagnosis not present

## 2023-11-15 DIAGNOSIS — B351 Tinea unguium: Secondary | ICD-10-CM | POA: Diagnosis not present

## 2023-11-15 DIAGNOSIS — M79674 Pain in right toe(s): Secondary | ICD-10-CM

## 2023-11-15 NOTE — Progress Notes (Signed)
This patient returns to my office for at risk foot care.  This patient requires this care by a professional since this patient will be at risk due to having diabetes DVT AKF and coagulation defect due to eliquis. This patient is unable to cut nails himself since the patient cannot reach his nails.These nails are painful walking and wearing shoes.  This patient presents for at risk foot care today.  General Appearance  Alert, conversant and in no acute stress.  Vascular  Dorsalis pedis and posterior tibial  pulses are palpable  bilaterally.  Capillary return is within normal limits  bilaterally. Temperature is within normal limits  bilaterally.  Neurologic  Senn-Weinstein monofilament wire test within normal limits  bilaterally. Muscle power within normal limits bilaterally.  Nails Thick disfigured discolored nails with subungual debris  from hallux to fifth toes bilaterally. No evidence of bacterial infection or drainage bilaterally.  Orthopedic  No limitations of motion  feet .  No crepitus or effusions noted.  No bony pathology or digital deformities noted.  Skin  normotropic skin with no porokeratosis noted bilaterally.  No signs of infections or ulcers noted.     Onychomycosis  Pain in right toes  Pain in left toes  Consent was obtained for treatment procedures.   Mechanical debridement of nails 1-5  bilaterally performed with a nail nipper.  Filed with dremel without incident.    Return office visit  4 months                     Told patient to return for periodic foot care and evaluation due to potential at risk complications.   Helane Gunther DPM

## 2023-11-16 ENCOUNTER — Other Ambulatory Visit: Payer: Self-pay | Admitting: Cardiology

## 2023-11-16 DIAGNOSIS — I10 Essential (primary) hypertension: Secondary | ICD-10-CM

## 2023-12-31 NOTE — Progress Notes (Unsigned)
 Patient name: Matthew Zimmerman MRN: [id number] DOB: Apr 25, 1946 Sex: male  REASON FOR CONSULT: 1 year follow-up, chronic IVC and iliac vein occlusion   HPI: Matthew Zimmerman is a 78 y.o. male, with history of hypertension, hyperlipidemia, DVT/PE that presents for 1 year follow-up of known chronic IVC and iliac vein occlusion.  Initially had lower extremity DVTs diagnosed around 2014-2015.  He was on warfarin and transitioned to Eliquis .  He then had recurrent DVTs in 2021 with a GI bleed requiring IVC filter placement by IR in 2021.  Back in 2021 had evidence of thrombus in the cava at the level of the filter.  Previously seen by my partner Dr. Shirley Douglas on 12/30/2019 and instructed to do conservative management with leg elevation compression and anticoagulation.  Today reports no worsening lower extremity swelling.  This is tolerable.  Unable to wear compression stockings as he states cannot bend over to put them on.   Past Medical History:  Diagnosis Date   Arthritis    Carpal tunnel syndrome on both sides    Chest pain    Coronary artery calcification seen on CAT scan    DVT (deep venous thrombosis) (HCC)    GERD (gastroesophageal reflux disease)    Headache    Hypercholesteremia    Hyperlipidemia    Hypertension    Neck muscle spasm    PE (pulmonary embolism)    Shortness of breath dyspnea    PE     Past Surgical History:  Procedure Laterality Date   BILATERAL CARPAL TUNNEL RELEASE Bilateral    CATARACT EXTRACTION W/PHACO Left 08/05/2006   Dr. Gennie Kicks   CATARACT EXTRACTION W/PHACO Right 08/18/2006   Dr. Gennie Kicks   COLONOSCOPY     COLONOSCOPY WITH PROPOFOL  Left 09/20/2019   Procedure: COLONOSCOPY WITH PROPOFOL ;  Surgeon: Genell Ken, MD;  Location: [facility];  Service: Gastroenterology;  Laterality: Left;   COLONOSCOPY WITH PROPOFOL  N/A 10/03/2019   Procedure: COLONOSCOPY WITH PROPOFOL ;  Surgeon: Baldo Bonds, MD;  Location: [facility];  Service: Endoscopy;   Laterality: N/A;   ELBOW SURGERY     BONE SPURS REMOVED B ELBOWS   EYE SURGERY Bilateral    cataracts   FOOT SURGERY     HEEL SPURS REMOVED B HEELS   HEMOSTASIS CLIP PLACEMENT  09/20/2019   Procedure: HEMOSTASIS CLIP PLACEMENT;  Surgeon: Genell Ken, MD;  Location: [facility];  Service: Gastroenterology;;   HEMOSTASIS CLIP PLACEMENT  10/03/2019   Procedure: HEMOSTASIS CLIP PLACEMENT;  Surgeon: Baldo Bonds, MD;  Location: [facility];  Service: Endoscopy;;   HOT HEMOSTASIS N/A 09/20/2019   Procedure: HOT HEMOSTASIS (ARGON PLASMA COAGULATION/BICAP);  Surgeon: Genell Ken, MD;  Location: [facility];  Service: Gastroenterology;  Laterality: N/A;   I & D EXTREMITY Right 09/18/2014   Procedure: IRRIGATION AND DEBRIDEMENT RIGHT THUMB, open fracture repair;  Surgeon: Shellie Dials, MD;  Location: [facility] OR;  Service: Orthopedics;  Laterality: Right;   IR IVC FILTER PLMT / S&I /IMG GUID/MOD SED  10/03/2019   LEG SURGERY Left    left thigh table saw accident, "cut to the bone", I&D/suturing   SHOULDER SURGERY Left    Bone spur   SPINE SURGERY  09/04/1985   lumbar spine   SUBMUCOSAL INJECTION  10/03/2019   Procedure: SUBMUCOSAL INJECTION;  Surgeon: Baldo Bonds, MD;  Location: [facility];  Service: Endoscopy;;   TOE SURGERY      Family History  Problem Relation Age of Onset   Cancer Mother    Diabetes  Father     SOCIAL HISTORY: Social History   Socioeconomic History   Marital status: Married    Spouse name: Not on file   Number of children: 2   Years of education: Not on file   Highest education level: Not on file  Occupational History   Occupation: Retired and disabled    Employer: [employer]  Tobacco Use   Smoking status: Former    Current packs/day: 0.00    Average packs/day: 0.5 packs/day for 13.0 years (6.5 ttl pk-yrs)    Types: Cigarettes    Start date: 14    Quit date: 1964    Years since quitting: 61.3   Smokeless tobacco: Former   Tobacco comments:     smoking since 5 years oldup until the age of 78yrs old  Vaping Use   Vaping status: Never Used  Substance and Sexual Activity   Alcohol use: No   Drug use: No   Sexual activity: Not on file  Other Topics Concern   Not on file  Social History Narrative   Lives with wife.Monroe [place]   He is retired from [employer], also disabled   Yvonnie Heritage son age 81 years old died suddenly at client's home on 11/03/18   Social Drivers of Health   Financial Resource Strain: Low Risk  (09/22/2019)   Overall Financial Resource Strain (CARDIA)    Difficulty of Paying Living Expenses: Not very hard  Food Insecurity: No Food Insecurity (04/07/2020)   Hunger Vital Sign    Worried About Running Out of Food in the Last Year: Never true    Ran Out of Food in the Last Year: Never true  Transportation Needs: No Transportation Needs (04/07/2020)   PRAPARE - Administrator, Civil Service (Medical): No    Lack of Transportation (Non-Medical): No  Physical Activity: Inactive (03/20/2019)   Exercise Vital Sign    Days of Exercise per Week: 0 days    Minutes of Exercise per Session: 0 min  Stress: No Stress Concern Present (09/22/2019)   [employer] of Occupational Health - Occupational Stress Questionnaire    Feeling of Stress : Only a little  Social Connections: Unknown (01/17/2022)   [facility]   Social Network    Social Network: Not on file  Intimate Partner Violence: Unknown (12/08/2021)   Received from [facility]   HITS    Physically Hurt: Not on file    Insult or Talk Down To: Not on file    Threaten Physical Harm: Not on file    Scream or Curse: Not on file    Allergies  Allergen Reactions   Cortisone Other (See Comments)    CBG thrown out of control   Clotrimazole-Betamethasone Other (See Comments)    Current Outpatient Medications  Medication Sig Dispense Refill   apixaban  (ELIQUIS ) 5 MG TABS tablet Take 1 tablet (5 mg total) by mouth 2  (two) times daily.     atorvastatin  (LIPITOR) 40 MG tablet TAKE 1 TABLET(40 MG) BY MOUTH AT BEDTIME 90 tablet 3   BD INSULIN  SYRINGE U/F 31G X 5/16" 0.5 ML MISC 2 (two) times daily.     dapagliflozin propanediol (FARXIGA) 10 MG TABS tablet Take 10 mg by mouth daily.     diclofenac  Sodium (VOLTAREN ) 1 % GEL Apply 2 g topically 4 (four) times daily. 100 g 1   glucose blood test strip Use to test blood sugar twice a day  insulin  NPH-regular Human (NOVOLIN 70/30) (70-30) 100 UNIT/ML injection Inject 30 Units into the skin 2 (two) times daily with a meal.      Lancets (ONETOUCH DELICA PLUS LANCET33G) MISC Use to test blood sugar twice daily     losartan  (COZAAR ) 25 MG tablet TAKE 1 TABLET(25 MG) BY MOUTH DAILY 90 tablet 3   metoprolol  succinate (TOPROL -XL) 25 MG 24 hr tablet TAKE 1 TABLET(25 MG) BY MOUTH DAILY WITH OR IMMEDIATELY FOLLOWING A MEAL 90 tablet 2   OZEMPIC, 0.25 OR 0.5 MG/DOSE, 2 MG/1.5ML SOPN Inject 0.5 mg into the skin once a week.     tiZANidine  (ZANAFLEX ) 2 MG tablet Take 1 tablet (2 mg total) by mouth every 8 (eight) hours as needed for muscle spasms. 12 tablet 0   No current facility-administered medications for this visit.    REVIEW OF SYSTEMS:  [X]  denotes positive finding, [ ]  denotes negative finding Cardiac  Comments:  Chest pain or chest pressure:    Shortness of breath upon exertion:    Short of breath when lying flat:    Irregular heart rhythm:        Vascular    Pain in calf, thigh, or hip brought on by ambulation:    Pain in feet at night that wakes you up from your sleep:     Blood clot in your veins:    Leg swelling:  x       Pulmonary    Oxygen  at home:    Productive cough:     Wheezing:         Neurologic    Sudden weakness in arms or legs:     Sudden numbness in arms or legs:     Sudden onset of difficulty speaking or slurred speech:    Temporary loss of vision in one eye:     Problems with dizziness:         Gastrointestinal    Blood in  stool:     Vomited blood:         Genitourinary    Burning when urinating:     Blood in urine:        Psychiatric    Major depression:         Hematologic    Bleeding problems:    Problems with blood clotting too easily:        Skin    Rashes or ulcers:        Constitutional    Fever or chills:      PHYSICAL EXAM: There were no vitals filed for this visit.   GENERAL: The patient is a well-nourished male, in no acute distress. The vital signs are documented above. CARDIAC: There is a regular rate and rhythm.  VASCULAR:  Bilateral femoral pulses palpable Bilateral DP pulses palpable Bilateral lower extremity swelling  No lower extremity ulcerations PULMONARY: No respiratory distress. ABDOMEN: Soft and non-tender. MUSCULOSKELETAL: There are no major deformities or cyanosis. NEUROLOGIC: No focal weakness or paresthesias are detected. SKIN: There are no ulcers or rashes noted. PSYCHIATRIC: The patient has a normal affect.  DATA:   CT venogram reviewed that shows IVC filter in place with the chronic occlusion of the infrarenal IVC and iliac veins.  ABI's on 12/18/22 were [place] with triphasic waveforms.    Assessment/Plan:  78 year old male presents for 1 year follow-up of his known infrarenal IVC and iliac vein chronic occlusion.  I previously sent him for CT that showed chroonic occlusion of the infrarenal  IVC and iliac veins in the setting of IVC filter placed in 2021 by IR.  This was previously followed by Dr. Shirley Douglas and he recommended conservative management for the chronic occlusion.    Fortunately he continues to have minimal lower extremity symptoms that are managed conservatively.  Again, discussed that we could try and retrieve his IVC filter and potentially stent his cava but this could put him at significant risk for complications including retroperitoneal bleed and other associated complications from anesthesia.  Ultimately he feels his symptoms are tolerable.  I  fully support conservative therapy with leg elevation and compression stockings.   Tolerating Eliquis  for multiple DVT's.  He can follow-up with me PRN.  Young Hensen, MD [facility] Office: [phone]

## 2024-01-01 ENCOUNTER — Encounter: Payer: Self-pay | Admitting: Vascular Surgery

## 2024-01-01 ENCOUNTER — Ambulatory Visit: Attending: Vascular Surgery | Admitting: Vascular Surgery

## 2024-01-01 VITALS — BP 134/76 | HR 60 | Temp 98.4°F | Resp 20 | Ht 68.0 in | Wt 194.2 lb

## 2024-01-01 DIAGNOSIS — I825Y3 Chronic embolism and thrombosis of unspecified deep veins of proximal lower extremity, bilateral: Secondary | ICD-10-CM

## 2024-03-20 ENCOUNTER — Ambulatory Visit: Admitting: Podiatry

## 2024-03-20 ENCOUNTER — Encounter: Payer: Self-pay | Admitting: Podiatry

## 2024-03-20 DIAGNOSIS — M79674 Pain in right toe(s): Secondary | ICD-10-CM | POA: Diagnosis not present

## 2024-03-20 DIAGNOSIS — B351 Tinea unguium: Secondary | ICD-10-CM | POA: Diagnosis not present

## 2024-03-20 DIAGNOSIS — M79675 Pain in left toe(s): Secondary | ICD-10-CM

## 2024-03-20 DIAGNOSIS — E1142 Type 2 diabetes mellitus with diabetic polyneuropathy: Secondary | ICD-10-CM | POA: Diagnosis not present

## 2024-03-20 NOTE — Progress Notes (Signed)
This patient returns to my office for at risk foot care.  This patient requires this care by a professional since this patient will be at risk due to having diabetes DVT AKF and coagulation defect due to eliquis. This patient is unable to cut nails himself since the patient cannot reach his nails.These nails are painful walking and wearing shoes.  This patient presents for at risk foot care today.  General Appearance  Alert, conversant and in no acute stress.  Vascular  Dorsalis pedis and posterior tibial  pulses are palpable  bilaterally.  Capillary return is within normal limits  bilaterally. Temperature is within normal limits  bilaterally.  Neurologic  Senn-Weinstein monofilament wire test within normal limits  bilaterally. Muscle power within normal limits bilaterally.  Nails Thick disfigured discolored nails with subungual debris  from hallux to fifth toes bilaterally. No evidence of bacterial infection or drainage bilaterally.  Orthopedic  No limitations of motion  feet .  No crepitus or effusions noted.  No bony pathology or digital deformities noted.  Skin  normotropic skin with no porokeratosis noted bilaterally.  No signs of infections or ulcers noted.     Onychomycosis  Pain in right toes  Pain in left toes  Consent was obtained for treatment procedures.   Mechanical debridement of nails 1-5  bilaterally performed with a nail nipper.  Filed with dremel without incident.    Return office visit  4 months                     Told patient to return for periodic foot care and evaluation due to potential at risk complications.   Helane Gunther DPM

## 2024-06-27 ENCOUNTER — Other Ambulatory Visit: Payer: Self-pay | Admitting: Cardiology

## 2024-06-27 DIAGNOSIS — I10 Essential (primary) hypertension: Secondary | ICD-10-CM

## 2024-06-27 DIAGNOSIS — E7800 Pure hypercholesterolemia, unspecified: Secondary | ICD-10-CM

## 2024-06-27 DIAGNOSIS — E1139 Type 2 diabetes mellitus with other diabetic ophthalmic complication: Secondary | ICD-10-CM

## 2024-07-21 ENCOUNTER — Ambulatory Visit: Admitting: Podiatry

## 2024-07-21 ENCOUNTER — Encounter: Payer: Self-pay | Admitting: Podiatry

## 2024-07-21 DIAGNOSIS — E1142 Type 2 diabetes mellitus with diabetic polyneuropathy: Secondary | ICD-10-CM | POA: Diagnosis not present

## 2024-07-21 DIAGNOSIS — M79674 Pain in right toe(s): Secondary | ICD-10-CM | POA: Diagnosis not present

## 2024-07-21 DIAGNOSIS — M79675 Pain in left toe(s): Secondary | ICD-10-CM | POA: Diagnosis not present

## 2024-07-21 DIAGNOSIS — B351 Tinea unguium: Secondary | ICD-10-CM

## 2024-07-21 NOTE — Progress Notes (Signed)
 This patient returns to my office for at risk foot care.  This patient requires this care by a professional since this patient will be at risk due to having diabetes DVT AKF and coagulation defect due to eliquis . This patient is unable to cut nails himself since the patient cannot reach his nails.These nails are painful walking and wearing shoes.  This patient presents for at risk foot care today.  General Appearance  Alert, conversant and in no acute stress.  Vascular  Dorsalis pedis and posterior tibial  pulses are palpable  bilaterally.  Capillary return is within normal limits  bilaterally. Temperature is within normal limits  bilaterally.  Neurologic  Senn-Weinstein monofilament wire test within normal limits  bilaterally. Muscle power within normal limits bilaterally.  Nails Thick disfigured discolored nails with subungual debris  from hallux to fifth toes bilaterally. No evidence of bacterial infection or drainage bilaterally.  Orthopedic  No limitations of motion  feet .  No crepitus or effusions noted.  HAV  B/L  Skin  normotropic skin with no porokeratosis noted bilaterally.  No signs of infections or ulcers noted.     Onychomycosis  Pain in right toes  Pain in left toes  Consent was obtained for treatment procedures.   Mechanical debridement of nails 1-5  bilaterally performed with a nail nipper.  Filed with dremel without incident.    Return office visit  3 months                     Told patient to return for periodic foot care and evaluation due to potential at risk complications.   Cordella Bold DPM

## 2024-07-24 DIAGNOSIS — I10 Essential (primary) hypertension: Secondary | ICD-10-CM | POA: Diagnosis not present

## 2024-07-24 DIAGNOSIS — E785 Hyperlipidemia, unspecified: Secondary | ICD-10-CM | POA: Diagnosis not present

## 2024-07-24 DIAGNOSIS — E1140 Type 2 diabetes mellitus with diabetic neuropathy, unspecified: Secondary | ICD-10-CM | POA: Diagnosis not present

## 2024-07-24 DIAGNOSIS — E11319 Type 2 diabetes mellitus with unspecified diabetic retinopathy without macular edema: Secondary | ICD-10-CM | POA: Diagnosis not present

## 2024-07-24 DIAGNOSIS — Z86711 Personal history of pulmonary embolism: Secondary | ICD-10-CM | POA: Diagnosis not present

## 2024-07-24 DIAGNOSIS — Z7901 Long term (current) use of anticoagulants: Secondary | ICD-10-CM | POA: Diagnosis not present

## 2024-07-24 DIAGNOSIS — N1831 Chronic kidney disease, stage 3a: Secondary | ICD-10-CM | POA: Diagnosis not present

## 2024-07-24 DIAGNOSIS — I82503 Chronic embolism and thrombosis of unspecified deep veins of lower extremity, bilateral: Secondary | ICD-10-CM | POA: Diagnosis not present

## 2024-07-24 DIAGNOSIS — E1122 Type 2 diabetes mellitus with diabetic chronic kidney disease: Secondary | ICD-10-CM | POA: Diagnosis not present

## 2024-08-14 ENCOUNTER — Other Ambulatory Visit: Payer: Self-pay | Admitting: Cardiology

## 2024-08-14 DIAGNOSIS — I10 Essential (primary) hypertension: Secondary | ICD-10-CM

## 2024-08-15 ENCOUNTER — Other Ambulatory Visit: Payer: Self-pay | Admitting: Cardiology

## 2024-08-15 DIAGNOSIS — I10 Essential (primary) hypertension: Secondary | ICD-10-CM

## 2024-09-08 DIAGNOSIS — I10 Essential (primary) hypertension: Secondary | ICD-10-CM

## 2024-09-08 NOTE — Telephone Encounter (Signed)
 PATIENT MUST MAKE AN APPOINTMENT IN ORDER TO RECEIVE ADDITIONAL REFILLS, 2ND ATTEMPT.

## 2024-09-12 ENCOUNTER — Other Ambulatory Visit: Payer: Self-pay | Admitting: Cardiology

## 2024-09-12 DIAGNOSIS — I10 Essential (primary) hypertension: Secondary | ICD-10-CM

## 2024-10-20 ENCOUNTER — Ambulatory Visit: Admitting: Podiatry
